# Patient Record
Sex: Female | Born: 1960 | Race: White | Hispanic: No | Marital: Married | Smoking: Never smoker
Health system: Southern US, Community
[De-identification: ages and names within clinical notes are randomized; demographics above are authoritative.]

## PROBLEM LIST (undated history)

## (undated) ENCOUNTER — Emergency Department (HOSPITAL_COMMUNITY): Admission: EM | Payer: Medicare Other

## (undated) DIAGNOSIS — I639 Cerebral infarction, unspecified: Secondary | ICD-10-CM

## (undated) DIAGNOSIS — Z992 Dependence on renal dialysis: Secondary | ICD-10-CM

## (undated) DIAGNOSIS — N19 Unspecified kidney failure: Secondary | ICD-10-CM

## (undated) DIAGNOSIS — F0390 Unspecified dementia without behavioral disturbance: Secondary | ICD-10-CM

## (undated) DIAGNOSIS — R21 Rash and other nonspecific skin eruption: Secondary | ICD-10-CM

## (undated) DIAGNOSIS — M549 Dorsalgia, unspecified: Secondary | ICD-10-CM

## (undated) DIAGNOSIS — Z5189 Encounter for other specified aftercare: Secondary | ICD-10-CM

## (undated) DIAGNOSIS — G8929 Other chronic pain: Secondary | ICD-10-CM

## (undated) DIAGNOSIS — I10 Essential (primary) hypertension: Secondary | ICD-10-CM

## (undated) DIAGNOSIS — D649 Anemia, unspecified: Secondary | ICD-10-CM

## (undated) DIAGNOSIS — B999 Unspecified infectious disease: Secondary | ICD-10-CM

## (undated) DIAGNOSIS — I739 Peripheral vascular disease, unspecified: Secondary | ICD-10-CM

## (undated) DIAGNOSIS — I469 Cardiac arrest, cause unspecified: Secondary | ICD-10-CM

## (undated) DIAGNOSIS — IMO0001 Reserved for inherently not codable concepts without codable children: Secondary | ICD-10-CM

## (undated) HISTORY — DX: Essential (primary) hypertension: I10

## (undated) HISTORY — PX: CARPAL TUNNEL RELEASE: SHX101

## (undated) HISTORY — DX: Anemia, unspecified: D64.9

## (undated) HISTORY — PX: HAND AMPUTATION: SUR26

---

## 2002-04-25 HISTORY — PX: BELOW KNEE LEG AMPUTATION: SUR23

## 2002-07-17 ENCOUNTER — Inpatient Hospital Stay (HOSPITAL_COMMUNITY): Admission: EM | Admit: 2002-07-17 | Discharge: 2002-07-23 | Payer: Self-pay | Admitting: Emergency Medicine

## 2002-07-17 ENCOUNTER — Encounter: Payer: Self-pay | Admitting: Family Medicine

## 2002-07-17 ENCOUNTER — Encounter: Payer: Self-pay | Admitting: Emergency Medicine

## 2002-07-18 ENCOUNTER — Encounter: Payer: Self-pay | Admitting: Family Medicine

## 2002-07-19 ENCOUNTER — Encounter (INDEPENDENT_AMBULATORY_CARE_PROVIDER_SITE_OTHER): Payer: Self-pay | Admitting: Specialist

## 2002-07-26 ENCOUNTER — Encounter: Admission: RE | Admit: 2002-07-26 | Discharge: 2002-07-26 | Payer: Self-pay | Admitting: Sports Medicine

## 2002-11-11 ENCOUNTER — Encounter: Admission: RE | Admit: 2002-11-11 | Discharge: 2002-12-11 | Payer: Self-pay | Admitting: Orthopedic Surgery

## 2003-07-04 ENCOUNTER — Inpatient Hospital Stay (HOSPITAL_COMMUNITY): Admission: AD | Admit: 2003-07-04 | Discharge: 2003-07-09 | Payer: Self-pay | Admitting: Orthopedic Surgery

## 2003-07-06 ENCOUNTER — Encounter (INDEPENDENT_AMBULATORY_CARE_PROVIDER_SITE_OTHER): Payer: Self-pay | Admitting: *Deleted

## 2004-12-07 ENCOUNTER — Ambulatory Visit: Payer: Self-pay | Admitting: General Surgery

## 2004-12-07 ENCOUNTER — Inpatient Hospital Stay: Payer: Self-pay | Admitting: General Surgery

## 2004-12-21 ENCOUNTER — Ambulatory Visit: Payer: Self-pay | Admitting: General Surgery

## 2005-04-28 ENCOUNTER — Inpatient Hospital Stay (HOSPITAL_COMMUNITY): Admission: EM | Admit: 2005-04-28 | Discharge: 2005-05-09 | Payer: Self-pay | Admitting: Emergency Medicine

## 2005-04-28 ENCOUNTER — Encounter: Admission: RE | Admit: 2005-04-28 | Discharge: 2005-04-28 | Payer: Self-pay | Admitting: Family Medicine

## 2005-04-28 ENCOUNTER — Ambulatory Visit: Payer: Self-pay | Admitting: Infectious Diseases

## 2005-04-28 ENCOUNTER — Encounter (INDEPENDENT_AMBULATORY_CARE_PROVIDER_SITE_OTHER): Payer: Self-pay | Admitting: *Deleted

## 2005-05-11 ENCOUNTER — Encounter (HOSPITAL_COMMUNITY): Admission: RE | Admit: 2005-05-11 | Discharge: 2005-08-09 | Payer: Self-pay | Admitting: Orthopaedic Surgery

## 2005-11-04 ENCOUNTER — Encounter: Admission: RE | Admit: 2005-11-04 | Discharge: 2005-11-04 | Payer: Self-pay | Admitting: Orthopaedic Surgery

## 2006-06-29 ENCOUNTER — Emergency Department: Payer: Self-pay | Admitting: Emergency Medicine

## 2007-09-25 ENCOUNTER — Ambulatory Visit (HOSPITAL_BASED_OUTPATIENT_CLINIC_OR_DEPARTMENT_OTHER): Admission: RE | Admit: 2007-09-25 | Discharge: 2007-09-25 | Payer: Self-pay | Admitting: Orthopedic Surgery

## 2009-02-26 ENCOUNTER — Inpatient Hospital Stay (HOSPITAL_COMMUNITY): Admission: EM | Admit: 2009-02-26 | Discharge: 2009-03-04 | Payer: Self-pay | Admitting: Emergency Medicine

## 2009-02-27 ENCOUNTER — Encounter (INDEPENDENT_AMBULATORY_CARE_PROVIDER_SITE_OTHER): Payer: Self-pay | Admitting: Internal Medicine

## 2009-08-08 ENCOUNTER — Inpatient Hospital Stay: Payer: Self-pay | Admitting: Internal Medicine

## 2009-10-08 ENCOUNTER — Emergency Department (HOSPITAL_COMMUNITY): Admission: EM | Admit: 2009-10-08 | Discharge: 2009-10-08 | Payer: Self-pay | Admitting: Emergency Medicine

## 2009-12-23 ENCOUNTER — Ambulatory Visit: Payer: Self-pay | Admitting: Ophthalmology

## 2009-12-24 HISTORY — PX: CATARACT EXTRACTION W/ INTRAOCULAR LENS IMPLANT: SHX1309

## 2010-01-04 ENCOUNTER — Ambulatory Visit: Payer: Self-pay | Admitting: Ophthalmology

## 2010-07-11 LAB — URINALYSIS, ROUTINE W REFLEX MICROSCOPIC
Ketones, ur: NEGATIVE mg/dL
Specific Gravity, Urine: 1.027 (ref 1.005–1.030)
Urobilinogen, UA: 0.2 mg/dL (ref 0.0–1.0)
pH: 5.5 (ref 5.0–8.0)

## 2010-07-11 LAB — CBC
HCT: 33 % — ABNORMAL LOW (ref 36.0–46.0)
Hemoglobin: 11 g/dL — ABNORMAL LOW (ref 12.0–15.0)
MCHC: 33.4 g/dL (ref 30.0–36.0)
MCV: 83.4 fL (ref 78.0–100.0)
Platelets: 285 K/uL (ref 150–400)
RBC: 3.96 MIL/uL (ref 3.87–5.11)
RDW: 16.6 % — ABNORMAL HIGH (ref 11.5–15.5)
WBC: 7 K/uL (ref 4.0–10.5)

## 2010-07-11 LAB — COMPREHENSIVE METABOLIC PANEL WITH GFR
ALT: 9 U/L (ref 0–35)
AST: 13 U/L (ref 0–37)
Albumin: 2 g/dL — ABNORMAL LOW (ref 3.5–5.2)
Alkaline Phosphatase: 236 U/L — ABNORMAL HIGH (ref 39–117)
BUN: 39 mg/dL — ABNORMAL HIGH (ref 6–23)
CO2: 19 meq/L (ref 19–32)
Calcium: 9 mg/dL (ref 8.4–10.5)
Chloride: 103 meq/L (ref 96–112)
Creatinine, Ser: 2.52 mg/dL — ABNORMAL HIGH (ref 0.4–1.2)
GFR calc non Af Amer: 20 mL/min — ABNORMAL LOW
Glucose, Bld: 183 mg/dL — ABNORMAL HIGH (ref 70–99)
Potassium: 4.2 meq/L (ref 3.5–5.1)
Sodium: 131 meq/L — ABNORMAL LOW (ref 135–145)
Total Bilirubin: 0.6 mg/dL (ref 0.3–1.2)
Total Protein: 7.1 g/dL (ref 6.0–8.3)

## 2010-07-11 LAB — LIPASE, BLOOD: Lipase: 43 U/L (ref 11–59)

## 2010-07-11 LAB — DIFFERENTIAL
Eosinophils Absolute: 0.2 10*3/uL (ref 0.0–0.7)
Eosinophils Relative: 3 % (ref 0–5)
Lymphs Abs: 1.5 10*3/uL (ref 0.7–4.0)
Monocytes Relative: 19 % — ABNORMAL HIGH (ref 3–12)

## 2010-07-11 LAB — URINE MICROSCOPIC-ADD ON

## 2010-07-28 ENCOUNTER — Inpatient Hospital Stay: Payer: Self-pay | Admitting: Internal Medicine

## 2010-07-28 LAB — BASIC METABOLIC PANEL
BUN: 28 mg/dL — ABNORMAL HIGH (ref 6–23)
CO2: 20 mEq/L (ref 19–32)
Calcium: 8 mg/dL — ABNORMAL LOW (ref 8.4–10.5)
Calcium: 8.2 mg/dL — ABNORMAL LOW (ref 8.4–10.5)
Calcium: 8.6 mg/dL (ref 8.4–10.5)
Chloride: 107 mEq/L (ref 96–112)
Chloride: 108 mEq/L (ref 96–112)
Creatinine, Ser: 1.71 mg/dL — ABNORMAL HIGH (ref 0.4–1.2)
Creatinine, Ser: 1.76 mg/dL — ABNORMAL HIGH (ref 0.4–1.2)
Creatinine, Ser: 1.85 mg/dL — ABNORMAL HIGH (ref 0.4–1.2)
Creatinine, Ser: 2.68 mg/dL — ABNORMAL HIGH (ref 0.4–1.2)
GFR calc Af Amer: 23 mL/min — ABNORMAL LOW (ref >60)
GFR calc Af Amer: 36 mL/min — ABNORMAL LOW (ref >60)
GFR calc non Af Amer: 29 mL/min — ABNORMAL LOW (ref >60)
Glucose, Bld: 102 mg/dL — ABNORMAL HIGH (ref 70–99)
Glucose, Bld: 106 mg/dL — ABNORMAL HIGH (ref 70–99)
Glucose, Bld: 200 mg/dL — ABNORMAL HIGH (ref 70–99)
Potassium: 5.1 mEq/L (ref 3.5–5.1)
Potassium: 5.2 mEq/L — ABNORMAL HIGH (ref 3.5–5.1)
Sodium: 130 mEq/L — ABNORMAL LOW (ref 135–145)
Sodium: 138 mEq/L (ref 135–145)

## 2010-07-28 LAB — CREATININE, URINE, 24 HOUR
Collection Interval-UCRE24: 24 hours
Creatinine, 24H Ur: 1388 mg/d (ref 700–1800)
Creatinine, Urine: 77.1 mg/dL
Urine Total Volume-UCRE24: 1800 mL

## 2010-07-28 LAB — GLUCOSE, CAPILLARY
Glucose-Capillary: 102 mg/dL — ABNORMAL HIGH (ref 70–99)
Glucose-Capillary: 108 mg/dL — ABNORMAL HIGH (ref 70–99)
Glucose-Capillary: 116 mg/dL — ABNORMAL HIGH (ref 70–99)
Glucose-Capillary: 146 mg/dL — ABNORMAL HIGH (ref 70–99)
Glucose-Capillary: 148 mg/dL — ABNORMAL HIGH (ref 70–99)
Glucose-Capillary: 148 mg/dL — ABNORMAL HIGH (ref 70–99)
Glucose-Capillary: 153 mg/dL — ABNORMAL HIGH (ref 70–99)
Glucose-Capillary: 156 mg/dL — ABNORMAL HIGH (ref 70–99)
Glucose-Capillary: 156 mg/dL — ABNORMAL HIGH (ref 70–99)
Glucose-Capillary: 159 mg/dL — ABNORMAL HIGH (ref 70–99)
Glucose-Capillary: 161 mg/dL — ABNORMAL HIGH (ref 70–99)
Glucose-Capillary: 167 mg/dL — ABNORMAL HIGH (ref 70–99)
Glucose-Capillary: 189 mg/dL — ABNORMAL HIGH (ref 70–99)
Glucose-Capillary: 233 mg/dL — ABNORMAL HIGH (ref 70–99)
Glucose-Capillary: 86 mg/dL (ref 70–99)

## 2010-07-28 LAB — ANTI-NEUTROPHIL ANTIBODY

## 2010-07-28 LAB — RENAL FUNCTION PANEL
Albumin: 1.8 g/dL — ABNORMAL LOW (ref 3.5–5.2)
GFR calc Af Amer: 26 mL/min — ABNORMAL LOW (ref >60)
GFR calc non Af Amer: 22 mL/min — ABNORMAL LOW (ref >60)
Glucose, Bld: 102 mg/dL — ABNORMAL HIGH (ref 70–99)
Phosphorus: 4.5 mg/dL (ref 2.3–4.6)
Potassium: 4.8 mEq/L (ref 3.5–5.1)
Sodium: 137 mEq/L (ref 135–145)

## 2010-07-28 LAB — CBC
HCT: 27.4 % — ABNORMAL LOW (ref 36.0–46.0)
HCT: 29.2 % — ABNORMAL LOW (ref 36.0–46.0)
Hemoglobin: 10.1 g/dL — ABNORMAL LOW (ref 12.0–15.0)
Hemoglobin: 10.2 g/dL — ABNORMAL LOW (ref 12.0–15.0)
Hemoglobin: 9.5 g/dL — ABNORMAL LOW (ref 12.0–15.0)
MCHC: 34.6 g/dL (ref 30.0–36.0)
MCHC: 34.7 g/dL (ref 30.0–36.0)
MCHC: 34.8 g/dL (ref 30.0–36.0)
MCV: 87.7 fL (ref 78.0–100.0)
MCV: 87.8 fL (ref 78.0–100.0)
Platelets: 289 10*3/uL (ref 150–400)
Platelets: 312 10*3/uL (ref 150–400)
RBC: 3.12 MIL/uL — ABNORMAL LOW (ref 3.87–5.11)
RBC: 3.3 MIL/uL — ABNORMAL LOW (ref 3.87–5.11)
RBC: 3.94 MIL/uL (ref 3.87–5.11)
RDW: 12.6 % (ref 11.5–15.5)
RDW: 12.8 % (ref 11.5–15.5)
RDW: 12.8 % (ref 11.5–15.5)
WBC: 12.3 10*3/uL — ABNORMAL HIGH (ref 4.0–10.5)

## 2010-07-28 LAB — PTH, INTACT AND CALCIUM: Calcium, Total (PTH): 8 mg/dL — ABNORMAL LOW (ref 8.4–10.5)

## 2010-07-28 LAB — PROTEIN ELECTROPH W RFLX QUANT IMMUNOGLOBULINS
Alpha-2-Globulin: 22.1 % — ABNORMAL HIGH (ref 7.1–11.8)
Beta 2: 9.4 % — ABNORMAL HIGH (ref 3.2–6.5)
Beta Globulin: 5.8 % (ref 4.7–7.2)
Gamma Globulin: 19.4 % — ABNORMAL HIGH (ref 11.1–18.8)
M-Spike, %: NOT DETECTED g/dL
Total Protein ELP: 6.2 g/dL (ref 6.0–8.3)

## 2010-07-28 LAB — COMPREHENSIVE METABOLIC PANEL
Alkaline Phosphatase: 85 U/L (ref 39–117)
BUN: 30 mg/dL — ABNORMAL HIGH (ref 6–23)
BUN: 53 mg/dL — ABNORMAL HIGH (ref 6–23)
CO2: 22 mEq/L (ref 19–32)
CO2: 24 mEq/L (ref 19–32)
Calcium: 8.1 mg/dL — ABNORMAL LOW (ref 8.4–10.5)
Chloride: 102 mEq/L (ref 96–112)
GFR calc Af Amer: 27 mL/min — ABNORMAL LOW (ref >60)
GFR calc non Af Amer: 16 mL/min — ABNORMAL LOW (ref >60)
Glucose, Bld: 195 mg/dL — ABNORMAL HIGH (ref 70–99)
Potassium: 4.3 mEq/L (ref 3.5–5.1)
Sodium: 134 mEq/L — ABNORMAL LOW (ref 135–145)
Total Bilirubin: 0.7 mg/dL (ref 0.3–1.2)
Total Protein: 6.2 g/dL (ref 6.0–8.3)

## 2010-07-28 LAB — DIFFERENTIAL
Basophils Relative: 0 % (ref 0–1)
Eosinophils Absolute: 0.1 10*3/uL (ref 0.0–0.7)
Eosinophils Relative: 1 % (ref 0–5)
Lymphs Abs: 1.9 10*3/uL (ref 0.7–4.0)
Monocytes Absolute: 0.9 10*3/uL (ref 0.1–1.0)
Monocytes Relative: 7 % (ref 3–12)
Neutro Abs: 9.3 10*3/uL — ABNORMAL HIGH (ref 1.7–7.7)

## 2010-07-28 LAB — IRON AND TIBC
Iron: 28 ug/dL — ABNORMAL LOW (ref 42–135)
UIBC: 122 ug/dL

## 2010-07-28 LAB — MAGNESIUM: Magnesium: 2.3 mg/dL (ref 1.5–2.5)

## 2010-07-28 LAB — IGG, IGA, IGM
IgA: 455 mg/dL — ABNORMAL HIGH (ref 68–378)
IgG (Immunoglobin G), Serum: 1320 mg/dL (ref 694–1618)
IgM, Serum: 104 mg/dL (ref 60–263)

## 2010-07-28 LAB — NA AND K (SODIUM & POTASSIUM), 24 H UR
Potassium Urine: 23 mEq/L
Potassium, 24H Ur: 41 mEq/d (ref 25–125)
Sodium, Ur: 80 mEq/L

## 2010-07-28 LAB — UIFE/LIGHT CHAINS/TP QN, 24-HR UR
Albumin, U: DETECTED
Alpha 1, Urine: DETECTED — AB
Alpha 2, Urine: DETECTED — AB
Beta, Urine: DETECTED — AB
Free Kappa/Lambda Ratio: 2.98 ratio (ref 0.46–4.00)
Free Lt Chn Excr Rate: 417.6 mg/d
Total Protein, Urine: 173.1 mg/dL
Volume, Urine: 1800 mL

## 2010-07-28 LAB — SODIUM, URINE, TIMED: Sodium, Ur: 76 mEq/L

## 2010-07-28 LAB — CULTURE, ROUTINE-ABSCESS

## 2010-07-28 LAB — ANAEROBIC CULTURE

## 2010-07-28 LAB — ANA: Anti Nuclear Antibody(ANA): NEGATIVE

## 2010-07-28 LAB — VITAMIN D 25 HYDROXY (VIT D DEFICIENCY, FRACTURES): Vit D, 25-Hydroxy: 24 ng/mL — ABNORMAL LOW (ref 30–89)

## 2010-07-28 LAB — C4 COMPLEMENT: Complement C4, Body Fluid: 40 mg/dL (ref 16–47)

## 2010-07-28 LAB — SEDIMENTATION RATE: Sed Rate: 58 mm/hr — ABNORMAL HIGH (ref 0–22)

## 2010-07-28 LAB — PHOSPHORUS: Phosphorus: 3.3 mg/dL (ref 2.3–4.6)

## 2010-07-28 LAB — TRANSFERRIN: Transferrin: 110 mg/dL — ABNORMAL LOW (ref 212–360)

## 2010-07-29 HISTORY — PX: INSERTION OF DIALYSIS CATHETER: SHX1324

## 2010-09-03 ENCOUNTER — Ambulatory Visit: Payer: Self-pay | Admitting: Vascular Surgery

## 2010-09-07 NOTE — Op Note (Signed)
[name], [name]              ACCOUNT NO.:  [id number]   MEDICAL RECORD NO.:  [id number]          PATIENT TYPE:  AMB   LOCATION:  DSC                          FACILITY:  [facility]   PHYSICIAN:  Cindee Salt, M.D.       DATE OF BIRTH:  [birth year]   DATE OF PROCEDURE:  09/25/2007  DATE OF DISCHARGE:                               OPERATIVE REPORT   PREOPERATIVE DIAGNOSES:  1. Carpal tunnel syndrome, left hand.  2. Ulnar neuropathy, left elbow.   POSTOPERATIVE DIAGNOSIS:  1. Carpal tunnel syndrome, left hand.  2. Ulnar neuropathy, left elbow.   OPERATION:  Left carpal tunnel release with decompression ulnar nerve,  left elbow.   SURGEON:  Cindee Salt, MD   ASSISTANT:  Carolyne Fiscal, RN   ANESTHESIA:  General.   ANESTHESIOLOGIST:  Quita Skye. Krista Blue, MD.   HISTORY:  The patient is a 50 year old female with a history of  numbness, tingling, and pain in multiple fingers.  EMG nerve conductions  reveal ulnar neuropathy at her elbow, carpal tunnel.  This has not  responded to conservative treatment.  She is admitted now for carpal  tunnel release on her left hand with decompression, possible  transposition to ulnar nerve, left elbow.  She is aware of risks and  complications including; infection, recurrence injury to arteries,  nerves, tendons; incomplete relief of symptoms, and dystrophy.  In the  preoperative area, the patient is seen.  The extremity was marked, by  both the patient and surgeon.  Questions again encouraged and answered.  Antibiotic given.   PROCEDURE:  The patient was brought to the operating room where a  general anesthetic was carried out without difficulty.  She was prepped  using DuraPrep in supine position with left arm free.  A time-out was  then performed.  A tourniquet was used to exsanguinate the limb.  An  Esmarch bandage was used to exsanguinate the limb.  A tourniquet placed  high and the arm was inflated to 250 mmHg.  A longitudinal incision was  made in the  palm, carried down through the subcutaneous tissue.  Bleeders were electrocauterized.  The palmar fascia was split,  superficial palmar arch identified, the flexor tendon to the ring little  finger identified to the ulnar side of the median nerve, and carpal  retinaculum was incised with sharp dissection.  A right-angle and Sewall  retractor were placed between the skin and forearm fascia.  The fascia  was released for approximately 1.5 cm proximal to the wrist crease under  direct vision.  The canal was explored.  No further lesions were  identified.  Area compression to the nerve was apparent at the distal  margin of the hamate hook.  No further lesions were identified.  The  wound was irrigated.  Skin closed with interrupted 5-0 Vicryl Rapide  sutures.  A separate incision was then made over the medial side of the  elbow, carried down through the subcutaneous tissue.  Bleeders again  were electrocauterized.  The dissection was carried down to the medial  epicondyle, taking care to protect the posterior branches of  the medial  antebrachial cutaneous nerve of the forearm.  The Osborne's fascia was  then opened, the ulnar nerve was identified.  A fasciotomy was performed  to flexor carpi ulnaris.  A release performed up to the level of the  arcade of Struthers.  All fascia was removed from the underlying ulnar  nerve.  The arm placed through full flexion.  No subluxation was noted.  The wound was irrigated.  The subcutaneous tissue was then closed with  interrupted 4-0 Vicryl sutures and the skin with interrupted 5-0 Vicryl  Rapide sutures.  A sterile compressive dressing long-arm splint was  applied in approximately 30 degrees of flexion.  This included the wrist  and left the fingers free.  The patient tolerated the procedure well.  She was taken to the recovery room for observation in satisfactory  condition.   DISCHARGE SUMMARY:  She will be discharged home to return to [facility] in 1 week on [place] [facility].            ______________________________  Cindee Salt, M.D.     GK/MEDQ  D:  09/25/2007  T:  09/26/2007  Job:  [id number]   cc:   Aida Puffer  Dr. Vear Clock

## 2010-09-09 ENCOUNTER — Ambulatory Visit: Payer: Self-pay | Admitting: Vascular Surgery

## 2010-09-10 NOTE — Discharge Summary (Signed)
NAME:  [name], [name]              ACCOUNT NO.:  [id number]   MEDICAL RECORD NO.:  [id number]          PATIENT TYPE:  INP   LOCATION:  5040                         FACILITY:  [facility]   PHYSICIAN:  Mark C. Ophelia Charter, M.D.    DATE OF BIRTH:  [birth year]   DATE OF ADMISSION:  04/28/2005  DATE OF DISCHARGE:  05/09/2005                                 DISCHARGE SUMMARY   FINAL DIAGNOSES:  1.  Diabetes with vulva abscess extending to right posterior thigh      compartment.  2.  Escherichia coli urinary tract infection.  3.  Morbid obesity.  4.  Type 2 diabetes.  5.  Hypertension.   This 50 year old female had been treated in [place].  Her husband had  been doing packing changes to the vulva for several weeks with no  antibiotics, and she presented with purulent drainage and high fever and an  elevated white count, thigh swelling and inability to ambulate.  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  Workup included CT scan  which showed the large abscess in the posterior compartment.  She was taken  to the operating room initially by Dr. Ophelia Charter on April 28, 2005, with  extensive debridement and drainage of a large posterior abscess with  posterior incision.  She had one procedure done by Dr. Lajoyce Corners on April 30, 2005, and then repeat debridement by Dr. Ophelia Charter on May 02, 2005, and  May 04, 2005.  Antibiotic beads were placed at some of the procedures.  Albumin was 1.4.  A nutritional consult was obtained.  She also had a sodium  of 126, which was treated with fluid restriction.  She was placed on  vancomycin and Primaxin and also Clindamycin.  Cultures, initially Gram  stain showed gram-positive cocci.  Creatinine was elevated at 2.0.  Infectious disease consultation saw the patient and managed her antibiotics.  With serial wash-outs she had gradual improvement in the wound status with  antibiotic bead placement and removals.  Final beads were removed at the  bedside.   Glucose gradually came under control with sliding scales from the  200s down to the 67-125 range.  Hemoglobin A1c was 12.5.  Prealbumin was 3.5  and C-reactive protein was 3.  Abscess from her thigh showed moderate beta  strep, Streptococcus agalactiae, moderate E. coli, moderate group B strep.  She was finally on Augmentin 875 mg p.o. b.i.d. by the time she was  discharged, outpatient dressing changes with outpatient whirlpool, pulse  lavage ranged at discharge time.  PICC line was removed and temperature was  down.   CONDITION ON DISCHARGE:  Improved.      Mark C. Ophelia Charter, M.D.  Electronically Signed     MCY/MEDQ  D:  07/13/2005  T:  07/14/2005  Job:  [id number]

## 2010-09-10 NOTE — Op Note (Signed)
NAME:  [name], [name]              ACCOUNT NO.:  [id number]   MEDICAL RECORD NO.:  [id number]          PATIENT TYPE:  INP   LOCATION:  5040                         FACILITY:  [facility]   PHYSICIAN:  Mark C. Ophelia Charter, M.D.    DATE OF BIRTH:  03-24-1961   DATE OF PROCEDURE:  05/04/2005  DATE OF DISCHARGE:  05/09/2005                                 OPERATIVE REPORT   PREOPERATIVE DIAGNOSIS:  Right diabetic abscess involving the right labia,  pubic bone, and right hamstring compartment.   POSTOPERATIVE DIAGNOSIS:  Right diabetic abscess involving the right labia,  pubic bone, and right hamstring compartment.   PROCEDURE:  Repeat irrigation, excisional debridement, irrigation of right  thigh, right labia, right hamstring wound, and placement of polymethyl  methacrylate antibiotic beads.   ANESTHESIA:  GOT.   BRIEF HISTORY:  This is one of several procedures for a 50 year old female  who presented with an abscess starting from her labia which had been  initially treated in [place] and had some packing.  She had been off  antibiotics and the abscess had tracked distally and then posteriorly into  the posterior thigh with a massive collection of purulent material in the  hamstring compartment.  This was dictation is being redictated a month  later.   DESCRIPTION OF PROCEDURE:  After induction of anesthesia and careful  positioning, packing was removed and standard DuraPrep was used, split  sheets were used with impervious  stockinette and Coban.  The packing was  removed from both hamstring compartment and pubic region.  Excisional  debridement of necrotic tissue was performed.  Pulsatile lavage was used.  As before, the two wounds communicated.  After pulsatile lavage and removal  of remaining material, at the start of the procedure, the patient had  antibiotic beads removed and new antibiotic beads were mixed and then  replaced exiting posteriorly but beads were placed anteriorly, as  well, so  they could all be pulled out from a posterior approach.  The tissue, at this  time, was improved and she should be able to continue with outpatient lavage  once she is discharged and can be seen by physical therapy for hydrotherapy  now, since her tissues are improving and no large amounts of further  necrotic tissue are present.  Cultures have been growing Prevotella bivia  and also group B Strep and also E. coli.  After a dressing was applied, the  patient was placed back in the supine position, extubated, and transferred  to the recovery room in stable condition.      Mark C. Ophelia Charter, M.D.  Electronically Signed     MCY/MEDQ  D:  06/17/2005  T:  06/17/2005  Job:  [id number]

## 2010-09-10 NOTE — Op Note (Signed)
NAME:  [name], [name]                        ACCOUNT NO.:  [id number]   MEDICAL RECORD NO.:  [id number]                   PATIENT TYPE:  INP   LOCATION:  5014                                 FACILITY:  [facility]   PHYSICIAN:  [name], M.D.                DATE OF BIRTH:  Jun 07, 1960   DATE OF PROCEDURE:  07/07/2003  DATE OF DISCHARGE:                                 OPERATIVE REPORT   PREOPERATIVE DIAGNOSIS:  Status post irrigation and debridement and [name] right  third ray amputation on March 13.   POSTOPERATIVE DIAGNOSIS:  Status post irrigation and debridement and [name] right  third ray amputation on March 13.   PROCEDURE:  Irrigation and debridement of skin, soft tissue, and bone,  including pulse lavage with normal saline.   SURGEON:  [name], M.D.   ANESTHESIA:  LMA plus ankle block.   ESTIMATED BLOOD LOSS:  Minimal.   ANTIBIOTICS:  Zosyn preoperatively.   TOURNIQUET TIME:  None.   DISPOSITION:  To the PACU in stable condition.   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  The patient had the wound loosely closed and presents at  this time for repeat irrigation, debridement, and further debridement of  skin, soft tissue, and bone.  The risks and benefits were discussed with the  patient including persistent infection, nonhealing of the wound, need for  below the knee amputation.  The patient states she understands and wishes to  proceed at this time.   DESCRIPTION OF PROCEDURE:  The patient was brought to OR room 5 after  undergoing an ankle block.  She then underwent general anesthesia with LMA.  After an adequate level of anesthesia was obtained, the patient's right  lower extremity was prepped using Betadine paint and draped into [name] sterile  field.  The V incision from the previous third ray amputation was further  ellipsed out back to bleeding,  viable tissue.  There was good petechial  bleeding along the wound edges.  There was no abscess or purulence.  After  debridement of skin, soft tissue, and bone, the wound was irrigated with  pulse lavage.  The wound had good petechial bleeding.  The wound was then  closed using 2-0 nylon with vertical mattress, horizontal mattress, and [name]  far-near, near-far stitch.  The wound edges closed easily without tension on  the skin.  The wounds were covered with Adaptic, orthopedic sponges, sterile  Kerlix, and [name] Coban dressing.  The patient was extubated and taken to the  PACU in stable condition.  The plan is to continue IV antibiotics, continue  with glucose control, physical therapy non-weight bearing on the right.  [name], M.D.   MVD/MEDQ  D:  07/07/2003  T:  07/08/2003  Job:  [id number]

## 2010-09-10 NOTE — Op Note (Signed)
NAMEATHEENA, [name]              ACCOUNT NO.:  [id number]   MEDICAL RECORD NO.:  [id number]          PATIENT TYPE:  INP   LOCATION:  5040                         FACILITY:  [facility]   PHYSICIAN:  [name], MD     DATE OF BIRTH:  01-16-1961   DATE OF PROCEDURE:  04/30/2005  DATE OF DISCHARGE:                                 OPERATIVE REPORT   PREOPERATIVE DIAGNOSIS:  Abscess right thigh and right labia.   POSTOPERATIVE DIAGNOSIS:  Abscess right thigh and right labia.   PROCEDURE:  Irrigation debridement right thigh abscess and right labial  abscess.   SURGEON:  [name], M.D.   ANESTHESIA:  General.   ESTIMATED BLOOD LOSS:  Minimal.   ANTIBIOTICS:  Primaxin and vancomycin preoperatively.   DRAINS:  One Penrose drain.   DISPOSITION:  To PACU in stable condition.   INDICATIONS FOR PROCEDURE:  The patient is a 50 year old woman with a labial  and hamstring abscess.  The patient initially underwent irrigation and  debridement by Dr. Ophelia Charter.  She presents at this time for repeat irrigation  and debridement and will anticipate a followup irrigation and debridement  on Monday.  Risks and benefits were discussed including persistent  infection, need for additional surgery.  The patient states she understands  and wishes proceed at this time.   DESCRIPTION OF PROCEDURE:  The patient was brought to OR room #5 and  underwent a general anesthetic.  After adequate level of anesthesia  obtained, the patient was placed in the left lateral decubitus position with  the right side up and the right lower extremity was prepped using Betadine  paint and draped into a sterile field.  The staples were removed from the  posterior incision, and the packing was removed from the anterior labral  incision.  The antibiotic beads were removed. There were no loose beats  within the leg.  The wounds were irrigated with pulse lavage. There was  still persistent purulence prior to irrigation  debridement.  There was good  petechial bleeding along the muscle and wound edges. There was no purulence  after debridement.  A Penrose drain was placed deep in the wound  posteriorly, and the wound was closed loosely with 2-0 nylon.  Anteriorly  after irrigation debridement with pulse lavage, the wound was wicked open  with saline-soaked Kerlix.  This was also covered with 4x4s  Both  wounds were covered with 4x4s, ABDs and Hypafix tape.  The patient was  extubated, taken to PACU in stable condition.   PLAN:  Plan for repeat irrigation debridement on Monday.      [name], MD  Electronically Signed     MVD/MEDQ  D:  04/30/2005  T:  04/30/2005  Job:  [id number]

## 2010-09-10 NOTE — Op Note (Signed)
NAME:  [name], [name]              ACCOUNT NO.:  [id number]   MEDICAL RECORD NO.:  [id number]          PATIENT TYPE:  INP   LOCATION:  1825                         FACILITY:  [facility]   PHYSICIAN:  Mark C. Ophelia Charter, M.D.    DATE OF BIRTH:  1960/09/02   DATE OF PROCEDURE:  04/28/2005  DATE OF DISCHARGE:                                 OPERATIVE REPORT   PREOPERATIVE DIAGNOSIS:  Diabetic with buttocks, thigh and labial abscess,  question Fournier's gangrene.   POSTOPERATIVE DIAGNOSIS:  Right labial abscess with large thigh posterior  compartment and buttocks abscess with osteomyelitis of ischium and pubic  bone, gram positive cocci.   OPERATION PERFORMED:  Excision and debridement ischium, pubic bone, right  hamstrings, buttocks.  Debridement of skin and subcutaneous tissue, muscle  and bone.  Placement of polymethyl methacrylate Vancomycin beads.   SURGEON:  Mark C. Ophelia Charter, M.D.   ESTIMATED BLOOD LOSS:  300 mL.   Anterior wound packed.   CULTURES:  Aerobic and anaerobic cultures in the holding area prior to  antibiotics.  Intraoperative aerobic and anaerobic cultures.  Tissue for  fungus and pathologic exam.   ANESTHESIA:   INDICATIONS FOR PROCEDURE:  This 50 year old insulin dependent diabetic was  seen by me for the first time in the emergency room after I was called by  the ER staff stating that the patient had had an MRI today ordered by  Lillia Carmel, M.D. that revealed changes consistent with Fournier's  gangrene with posterior compartment abscess, buttock abscess, medial  compartment of the thigh abscess with changes up into the distal aspect of  the rectus muscle. She had had a boil treated by Dr. Scotty Court in [place],  who treated her short term with some antibiotics and then her husband has  been packing this labial area on the right for well over a month.  She  states she has been controlling the pain with Percocet.  She had some  fevers, was unable to stand, has  had a previous left below-knee amputation  done by [name], M.D. in 2004.  Thigh was tense, extremely painful  and plain x-ray demonstrated osteomyelitis type changes in the ischium.  MRI  report was consistent with Fournier's gangrene.   DESCRIPTION OF PROCEDURE:  The patient was placed in lateral position with  the left side down, right side up.  Axillary roll.  Drapes were used.  Split  sheets with impervious stockinette, Coban for access to both the labial  region, also posterior aspect of the thigh.  An extremely hard indurated  area, a 10 inch incision was made.  As soon as the subcutaneous tissue was  entered, there was immediate purulent material.  Cultures were obtained.  There was fat necrosis, fascia necrosis. This was excised.  Finger was used  for probing, extended down into the hamstrings.  The sciatic nerve was  identified by palpation in its normal position and there was some  involvement of semitendinosus, semimembranosus with purulent drainage and  some muscle necrosis.  Necrotic muscle was excised, some of the necrotic  tissue was sent for  pathologic exam. There was venous thrombosis in  subcutaneous tissue, also the muscle.  Finger dissection of the posterior  aspect of the trochanter was palpated as well as the ischial tuberosity.  In  the subcutaneous tissue it was tracked and some little pockets were all  irrigated out, used sharp curetting, pulsatile lavage and debridement with  rongeur and curette as needed.  Repeat pulsatile lavage was used.  Hip was  then externally rotated and the area was enlarged where she had a 3 mm  region that with palpation over the pubic bone would drain purulent  material. This was an area that had been cultured and this area was enlarged  using straight scissors, cutting the skin and then with finger palpation,  pulsatile lavage was used for irrigation.  The ischium was curetted.  Repeat  pulsatile lavage with another liter and  then finger dissection communicating  the posterior and medial anterior wounds and a __________ sponge was passed  through to make sure there was plenty of room for communication.  Repeat  pulsatile lavage anteriorly and then antibiotic beads were mixed.  A total  of I believe 34 beads were made by the scrub nurse.  The knots were placed  on the end of a #2 Ethibond and it was packed in the posterior with some  placed medial but not so many that would not be able to be removed.  Packing  was placed anteriorly with Betadine soaked Kerlix.  Posterior incision was  closed leaving two 1 inch gaps for drainage and multiple 4 x 4s, ABD's were  applied.  The patient was then transferred to the recovery room and will  have repeat debridement in 48 hours with high dose antibiotics.  Instrument  and needle counts were correct.      Mark C. Ophelia Charter, M.D.  Electronically Signed     MCY/MEDQ  D:  04/28/2005  T:  04/29/2005  Job:  [id number]

## 2010-09-10 NOTE — Discharge Summary (Signed)
NAME:  Bonnie [name], Bonnie [name]                        ACCOUNT NO.:  [id number]   MEDICAL RECORD NO.:  [id number]                   PATIENT TYPE:  INP   LOCATION:  5014                                 FACILITY:  [facility]   PHYSICIAN:  [name], M.D.                DATE OF BIRTH:  10/04/1960   DATE OF ADMISSION:  07/04/2003  DATE OF DISCHARGE:  07/09/2003                                 DISCHARGE SUMMARY   ADMISSION DIAGNOSIS:  Abscess right forefoot with gangrenous changes.   DISCHARGE DIAGNOSIS:  Abscess right forefoot with gangrenous changes.   PROCEDURE:  The patient underwent two surgical procedures for irrigation and  debridement and amputation of the right third ray.   CONDITION ON DISCHARGE:  Discharged to home in stable condition.   FOLLOW UP:  Plan to follow up in the office in two weeks.   HISTORY OF PRESENT ILLNESS:  The patient is [name] 50 year old woman with  insensate diabetic neuropathy who presented to the office on July 04, 2003  with cellulitis of the right forefoot with necrotic third toe.   HOSPITAL COURSE:  The patient was admitted for IV antibiotics and wound  care.  The patient's hospital course was essentially unremarkable.  She was  started on IV Zosyn for the infection cellulitis of the right forefoot.   On day one after antibiotics, her temperature was 100.9.  White blood cell  count was 17.1.  Albumin 2.0.  CBG of 338.  The forefoot still had  cellulitis.  The swelling had decreased.  The skin was now wrinkling at this  time.  She was continued on IV antibiotics and twice [name] day Dial soap soaks.  The patient continued with wound care and underwent irrigation, debridement  and right third ray amputation on July 06, 2003.  The cultures were  obtained x 2.  Ankle brachial indices showed adequate flow and she was  continued on her Zosyn.   Postoperative day one, the patient had progressive cellulitis and the  patient was scheduled for repeat irrigation and  debridement.  Medicine was  consulted for better glucose control.  The patient went back to surgery on  July 07, 2003 for repeat irrigation and debridement of skin, soft tissue,  and bone.  Status post irrigation, debridement, and [name] third ray amputation.   Postoperatively, the patient progressed well.  She was continued on the IV  Zosyn.  She was started on physical therapy with nonweightbearing on the  right.  She was kept on IV antibiotics for two days postoperatively and was  discharged to home in stable condition on July 09, 2003.  Antibiotics p.o.  after discharge with follow-up in the office in two weeks.  [name], M.D.    MVD/MEDQ  D:  07/31/2003  T:  07/31/2003  Job:  [id number]

## 2010-09-10 NOTE — Op Note (Signed)
NAME:  [name], HENNEN A                        ACCOUNT NO.:  [id number]   MEDICAL RECORD NO.:  [id number]                   PATIENT TYPE:  INP   LOCATION:  5014                                 FACILITY:  [facility]   PHYSICIAN:  [name], M.D.                DATE OF BIRTH:  1960/06/26   DATE OF PROCEDURE:  07/06/2003  DATE OF DISCHARGE:                                 OPERATIVE REPORT   PREOPERATIVE DIAGNOSIS:  Abscess and necrosis, right third ray, with midfoot  abscess.   PROCEDURE:  Right third ray amputation with irrigation and debridement of  skin, soft tissue, and bone.   SURGEON:  [name], M.D.   ANESTHESIA:  General.   ESTIMATED BLOOD LOSS:  Minimal.   ANTIBIOTICS:  Zosyn preoperatively.  Cultures obtained x2.   Ankle brachial indices preoperatively showed adequate circulation for  surgery.   DISPOSITION:  To PACU in stable condition.   INDICATION FOR PROCEDURE:  The patient is a 50 year old woman who presented  with an ischemic, necrotic right third toe.  The patient was admitted,  placed on IV antibiotics.  She had resolution of the cellulitis in her foot  but after antibiotics had a focal abscess identified, and the patient was  brought at this time for irrigation and debridement, decompression of the  abscess, and third ray amputation.  Risks and benefits were discussed,  including infection, neurovascular injury, nonhealing of the wound, need for  additional surgery, potential for below-knee amputation.  The patient states  she understands and wishes to proceed at this time.   DESCRIPTION OF PROCEDURE:  The patient was brought to OR room 5 and  underwent a general anesthetic.  After an adequate level of anesthesia  obtained, the patient's right lower extremity was prepped using Duraprep and  draped into a sterile field.  A V-type incision was made around the third  toe and third ray and the third ray was amputated.  There was a large deep  abscess, and  this was cultured x2.  The wound was irrigated with normal  saline.  There were no further areas of necrosis.  The wound was loosely  closed with a deep drain placed.  Anticipate returning to surgery for repeat  irrigation and debridement and partial further amputation.  After  debridement and closure, the wound was closed over a deep drain with Adaptic  orthopedic sponges, sterile Webril, and a Coban dressing.  The patient was  extubated, taken to the PACU in stable condition.                                               [name], M.D.    MVD/MEDQ  D:  07/06/2003  T:  07/07/2003  Job:  [id number]

## 2010-09-10 NOTE — Op Note (Signed)
NAME:  [name], [name]              ACCOUNT NO.:  [id number]   MEDICAL RECORD NO.:  [id number]          PATIENT TYPE:  INP   LOCATION:  5040                         FACILITY:  [facility]   PHYSICIAN:  Mark C. Ophelia Charter, M.D.    DATE OF BIRTH:  October 05, 1960   DATE OF PROCEDURE:  05/02/2005  DATE OF DISCHARGE:                                 OPERATIVE REPORT   PREOPERATIVE DIAGNOSIS:  Right posterior thigh hamstring abscess, labral  abscess and ischial and pubic osteomyelitis.   POSTOPERATIVE DIAGNOSIS:  Right posterior thigh hamstring abscess, labral  abscess and ischial and pubic osteomyelitis.   OPERATION PERFORMED:  Repeat irrigation and excisional debridement,  posterior hamstring irrigation, right labral and pubic bone wound and  antibiotic bead placement.   SURGEON:  Mark C. Ophelia Charter, M.D.   ANESTHESIA:  GOT.   ESTIMATED BLOOD LOSS:  100 to 200 mL.   DESCRIPTION OF PROCEDURE:  After induction of general anesthesia, the  patient was placed in the lateral position with the total hip arthroplasty  Mark VII frame holder.  She has had a left below-knee amputation in the  past.  The right hip was up.  Packing was removed from the anterior labral  incision and Penrose removed from the posterior incision. There was a large  amount of purulent drainage in the 4 x 4s.  After draping using total hip  drapes with impervious stockinet and Coban, sutures were removed and the  wound was opened.  There was necrotic fascia, still some purulent material  and finger dissection showed the purulent pocket dissected distally and  anteriorly along over the top of the __________.  This was irrigated and  sharply debrided using curettes.  The wound still extended from the  posterior wound to the anterior labral wound.  Pulsatile lavage was used in  the posterior wound.  In the anterior wound there was not any necrotic  tissue.  Packing had been removed.  Pulsatile lavage was used as well.  Scraping was  performed and careful palpation and probing to make sure there  were no pockets of purulent material that had not been decompressed.  After  repeat pulsatile lavage anteriorly and posteriorly, 20 antibiotic beads were  packed from posterior to anterior and then Kerlix was placed along side with  some Betadine in it.  Care was taken to make sure some of the beads went  distal and anterior in the area where the purulent necrotic tissue had been  found and it was able to be milked out.  After final debridement with a  second 3 L bag,  #1 or #2 nylon sutures were used simple interrupted leaving  the packing hanging out for drainage.  It is possible that we will repeat  debridement in 48 hours.  She may require VAC placement.  4 x 4s, ABD and  tape were applied.  The patient was then transferred to the recovery room in  stable condition.  Instrument and needle counts were correct.     Mark C. Ophelia Charter, M.D.  Electronically Signed    MCY/MEDQ  D:  05/02/2005  T:  05/03/2005  Job:  [id number]

## 2010-09-10 NOTE — Op Note (Signed)
NAMEMARELLY, [name]                          ACCOUNT NO.:  [id number]   MEDICAL RECORD NO.:  [id number]                   PATIENT TYPE:  INP   LOCATION:  5531                                 FACILITY:  [facility]   PHYSICIAN:  [name], M.D.                DATE OF BIRTH:  05-Jun-1960   DATE OF PROCEDURE:  07/18/2002  DATE OF DISCHARGE:                                 OPERATIVE REPORT   PREOPERATIVE DIAGNOSIS:  Osteomyelitis, left foot with a deep abscess.   POSTOPERATIVE DIAGNOSIS:  Osteomyelitis, left foot with a deep abscess.   PROCEDURE:  Left below-knee amputation.   SURGEON:  [name], M.D.   ANESTHESIA:  General LMA.   ESTIMATED BLOOD LOSS:  Minimal.   ANTIBIOTICS:  Zosyn preoperatively.   TOURNIQUET TIME:  11 minutes at 350 mmHg.   DISPOSITION:  To the PACU in condition.   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 The surgical options including foot salvage versus below-knee  amputation were discussed. The patient states she wished to proceed with a  below-knee amputation. The risks and benefits were discussed including  infection, neurovascular injury, nonhealing of the incision, need for a  higher level amputation. The patient states she understands and wishes to  proceed at this time.   DESCRIPTION OF PROCEDURE:  The patient was brought to the operating room 1  and underwent a general LMA anesthetic. After an adequate level of  anesthesia was obtained the patient's left lower extremity was prepped using  Duraprep and draped in a sterile field, and  her foot was draped into a  sterile impervious dressing. The leg was elevated and the tourniquet was  inflated to 350 mmHg.   A transverse incision was made 10 cm distal to the tibial tubercle. This  curved proximally and then a large posterior flap was developed.  The tibia  was cut 1 cm  proximal to the skin incision and the fibula was cut 1 cm  proximal to the tibia. A large knife was used to create a large posterior  flap.   The sciatic nerve was pulled, cut and allowed to retract. The vascular  bundles were suture ligated x3 each. The tourniquet was deflated after 11  minutes. Hemostasis was achieved.   The deep and superficial fascial layers were closed using #1 PDS. The skin  was closed with using Proximate staples. The wounds were covered with  Adaptic orthopedic sponges, Webril and a Coban dressing.   The patient was extubated. She was taken to the PACU in stable condition.  [name], M.D.    MVD/MEDQ  D:  07/18/2002  T:  07/19/2002  Job:  [id number]

## 2010-09-10 NOTE — Discharge Summary (Signed)
[name], [name]                          ACCOUNT NO.:  [id number]   MEDICAL RECORD NO.:  [id number]                   PATIENT TYPE:  INP   LOCATION:  5531                                 FACILITY:  [facility]   PHYSICIAN:  Billey Gosling, M.D.                 DATE OF BIRTH:  [birth year]   DATE OF ADMISSION:  07/17/2002  DATE OF DISCHARGE:  07/23/2002                                 DISCHARGE SUMMARY   DISCHARGE DIAGNOSES:  1. 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  2. Right foot ulcer.  3. Diabetes mellitus type 2 on insulin.  4. Hypertension.  5. Anemia.   PROCEDURES:  Left below the knee amputation on 07/18/03.   CONSULTATIONS:  Dr. Chales Abrahams of orthopedic surgery.   DISCHARGE MEDICATIONS:  1. Augmentin 875/125 mg 1 tablet b.i.d. x10 days.  2. Insulin 70/30 give 45 units q.a.m. and 35 units q.p.m.  3. Avandia 4 mg daily.  4. Ziac 5/6.25 mg one tablet daily.   DISCHARGE INSTRUCTIONS:  The patient is instructed to continue her diabetic  diet and record her blood sugars before each meal and at bedtime, then bring  to her follow visit with Dr. Clarene Duke.   FOLLOW UP:  The patient is to follow up with Dr. Lajoyce Corners in two weeks and the  office number was provided.  The patient also will call Dr. Clarene Duke for a  followup appointment and she requested to do that on her own.   HISTORY:  This 50 year old white female with a history of diabetes mellitus  type 2 for ten years and hypertension presented with a one-two week history  of left foot ulcer with worsening pain.  She was seen at [facility]  who sent her to [facility] for further evaluation.   HOSPITAL COURSE:  Problem 1.  Left foot osteomyelitis.  The patient was seen  by Dr. Lajoyce Corners of orthopedic surgery and underwent left below the knee  amputation for osteomyelitis of the left foot.  See the procedure report for  further details.  The patient was placed on  IV Zosyn perioperatively and  completed a five day course of that.  She will continue Augmentin for a  total of ten days.  The patient will receive a home health nurse and home  health PT to help with dressing changes as well as ambulation.   Problem 2.  Right foot ulcer.  The patient developed an ulcer on the right  foot.  An x-ray was negative for osteomyelitis.  She will be discharged home  with a Darco boot to that foot and will complete a ten day course of  Augmentin after being on IV Zosyn for five days.   Problem 3.  Diabetes mellitus type 2 not well controlled.  The patient was  placed on a sliding  scale insulin during hospitalization and blood sugars  remained in the 200s despite continual increase of her Lantus up to 48 units  per day.  It is discussed with the patient and decided to restart her 70/30  insulin as previously taken before switching to the Lantus.  She also  restarted on Avandia 4 mg daily.  On the day of discharge her fasting blood  sugar was 147 and she was instructed to check her blood sugars before each  meal and at bedtime and take it to her follow up visit.  It was stressed to  her the importance of keeping blood sugar control and will need follow up  closely.   Problem 4.  Anemia.  The patient had a hemoglobin of 11.4 on admission and  after surgery the hemoglobin went down to 9.0.  The patient was stable with  a hemoglobin of 10.2 prior to discharge.   Problem 5.  Hypertension.  The patient's blood pressure was adequately  controlled from the 120s to 140s systolic on her Ziac and we will continue  that as an outpatient.   LABORATORY DATA:  Hemoglobin A1C 10.9.  Urine culture negative.  Blood  cultures x2 negative to date.  A  lipid profile shows cholesterol 127,  triglycerides 208, HDL 19, LDL 66.                                               Billey Gosling, M.D.    AS/MEDQ  D:  07/23/2002  T:  07/23/2002  Job:  [id number]   cc:   [name],  M.D.  [address]  [place]  [place] [address]  Fax: [phone]

## 2010-09-27 ENCOUNTER — Ambulatory Visit: Payer: Self-pay | Admitting: Vascular Surgery

## 2010-10-01 ENCOUNTER — Ambulatory Visit: Payer: Self-pay | Admitting: Vascular Surgery

## 2010-10-01 HISTORY — PX: ARTERIOVENOUS GRAFT PLACEMENT: SUR1029

## 2010-12-01 ENCOUNTER — Emergency Department: Payer: Self-pay | Admitting: Internal Medicine

## 2011-01-05 ENCOUNTER — Encounter: Payer: Self-pay | Admitting: Gastroenterology

## 2011-01-20 LAB — BASIC METABOLIC PANEL
Calcium: 9.5
GFR calc Af Amer: 37 — ABNORMAL LOW
GFR calc non Af Amer: 31 — ABNORMAL LOW
Potassium: 4.5
Sodium: 131 — ABNORMAL LOW

## 2011-01-20 LAB — POCT HEMOGLOBIN-HEMACUE: Hemoglobin: 12.5

## 2011-02-02 ENCOUNTER — Ambulatory Visit: Payer: Self-pay | Admitting: Gastroenterology

## 2011-03-01 ENCOUNTER — Encounter (HOSPITAL_COMMUNITY): Payer: Self-pay | Admitting: *Deleted

## 2011-03-01 ENCOUNTER — Inpatient Hospital Stay (HOSPITAL_COMMUNITY)
Admission: EM | Admit: 2011-03-01 | Discharge: 2011-03-05 | DRG: 617 | Disposition: A | Discharge status: Home / Self Care | Payer: Medicare Other | Attending: Family Medicine | Admitting: Family Medicine

## 2011-03-01 ENCOUNTER — Emergency Department (HOSPITAL_COMMUNITY): Payer: Medicare Other

## 2011-03-01 DIAGNOSIS — E11621 Type 2 diabetes mellitus with foot ulcer: Secondary | ICD-10-CM | POA: Diagnosis present

## 2011-03-01 DIAGNOSIS — Z794 Long term (current) use of insulin: Secondary | ICD-10-CM

## 2011-03-01 DIAGNOSIS — S88119A Complete traumatic amputation at level between knee and ankle, unspecified lower leg, initial encounter: Secondary | ICD-10-CM

## 2011-03-01 DIAGNOSIS — E1165 Type 2 diabetes mellitus with hyperglycemia: Secondary | ICD-10-CM | POA: Diagnosis present

## 2011-03-01 DIAGNOSIS — D638 Anemia in other chronic diseases classified elsewhere: Secondary | ICD-10-CM | POA: Diagnosis present

## 2011-03-01 DIAGNOSIS — N186 End stage renal disease: Secondary | ICD-10-CM | POA: Diagnosis present

## 2011-03-01 DIAGNOSIS — I872 Venous insufficiency (chronic) (peripheral): Secondary | ICD-10-CM | POA: Diagnosis present

## 2011-03-01 DIAGNOSIS — I120 Hypertensive chronic kidney disease with stage 5 chronic kidney disease or end stage renal disease: Secondary | ICD-10-CM | POA: Diagnosis present

## 2011-03-01 DIAGNOSIS — I10 Essential (primary) hypertension: Secondary | ICD-10-CM | POA: Diagnosis present

## 2011-03-01 DIAGNOSIS — IMO0002 Reserved for concepts with insufficient information to code with codable children: Secondary | ICD-10-CM | POA: Diagnosis present

## 2011-03-01 DIAGNOSIS — D649 Anemia, unspecified: Secondary | ICD-10-CM | POA: Diagnosis present

## 2011-03-01 DIAGNOSIS — N179 Acute kidney failure, unspecified: Secondary | ICD-10-CM | POA: Diagnosis present

## 2011-03-01 DIAGNOSIS — D631 Anemia in chronic kidney disease: Secondary | ICD-10-CM | POA: Diagnosis present

## 2011-03-01 DIAGNOSIS — E1169 Type 2 diabetes mellitus with other specified complication: Principal | ICD-10-CM | POA: Diagnosis present

## 2011-03-01 DIAGNOSIS — M9080 Osteopathy in diseases classified elsewhere, unspecified site: Secondary | ICD-10-CM | POA: Diagnosis present

## 2011-03-01 DIAGNOSIS — M869 Osteomyelitis, unspecified: Secondary | ICD-10-CM | POA: Diagnosis present

## 2011-03-01 DIAGNOSIS — I739 Peripheral vascular disease, unspecified: Secondary | ICD-10-CM | POA: Diagnosis present

## 2011-03-01 DIAGNOSIS — Z992 Dependence on renal dialysis: Secondary | ICD-10-CM | POA: Diagnosis present

## 2011-03-01 DIAGNOSIS — N2581 Secondary hyperparathyroidism of renal origin: Secondary | ICD-10-CM | POA: Diagnosis present

## 2011-03-01 DIAGNOSIS — N039 Chronic nephritic syndrome with unspecified morphologic changes: Secondary | ICD-10-CM | POA: Diagnosis present

## 2011-03-01 DIAGNOSIS — Z886 Allergy status to analgesic agent status: Secondary | ICD-10-CM

## 2011-03-01 DIAGNOSIS — L0390 Cellulitis, unspecified: Secondary | ICD-10-CM

## 2011-03-01 DIAGNOSIS — Z880 Allergy status to penicillin: Secondary | ICD-10-CM

## 2011-03-01 HISTORY — DX: Unspecified kidney failure: N19

## 2011-03-01 LAB — BASIC METABOLIC PANEL
BUN: 48 mg/dL — ABNORMAL HIGH (ref 6–23)
Chloride: 97 mEq/L (ref 96–112)
GFR calc Af Amer: 7 mL/min — ABNORMAL LOW (ref >90)
GFR calc non Af Amer: 6 mL/min — ABNORMAL LOW (ref >90)
Potassium: 4.7 mEq/L (ref 3.5–5.1)
Sodium: 135 mEq/L (ref 135–145)

## 2011-03-01 LAB — CBC
HCT: 34 % — ABNORMAL LOW (ref 36.0–46.0)
Hemoglobin: 11.1 g/dL — ABNORMAL LOW (ref 12.0–15.0)
MCH: 28.5 pg (ref 26.0–34.0)
MCHC: 32.6 g/dL (ref 30.0–36.0)
MCV: 86.7 fL (ref 78.0–100.0)
Platelets: 183 10*3/uL (ref 150–400)
RBC: 3.9 MIL/uL (ref 3.87–5.11)
RDW: 15.3 % (ref 11.5–15.5)
WBC: 8.5 10*3/uL (ref 4.0–10.5)

## 2011-03-01 LAB — DIFFERENTIAL
Basophils Relative: 1 % (ref 0–1)
Lymphocytes Relative: 19 % (ref 12–46)
Monocytes Relative: 5 % (ref 3–12)
Neutro Abs: 6.2 10*3/uL (ref 1.7–7.7)
Neutrophils Relative %: 73 % (ref 43–77)

## 2011-03-01 LAB — GLUCOSE, CAPILLARY: Glucose-Capillary: 165 mg/dL — ABNORMAL HIGH (ref 70–99)

## 2011-03-01 MED ORDER — ONDANSETRON HCL 4 MG/2ML IJ SOLN
4.0000 mg | Freq: Four times a day (QID) | INTRAMUSCULAR | Status: DC | PRN
  Administered 2011-03-02 – 2011-03-05 (×2): 4 mg via INTRAVENOUS
  Filled 2011-03-01: qty 2

## 2011-03-01 MED ORDER — NEPRO/CARBSTEADY PO LIQD
237.0000 mL | ORAL | Status: DC | PRN
  Filled 2011-03-01: qty 237

## 2011-03-01 MED ORDER — LIDOCAINE-PRILOCAINE 2.5-2.5 % EX CREA
1.0000 "application " | TOPICAL_CREAM | CUTANEOUS | Status: DC | PRN
  Filled 2011-03-01: qty 5

## 2011-03-01 MED ORDER — ONDANSETRON HCL 4 MG PO TABS: 4.0000 mg | ORAL_TABLET | Freq: Four times a day (QID) | ORAL | Status: DC | PRN

## 2011-03-01 MED ORDER — INSULIN ASPART 100 UNIT/ML ~~LOC~~ SOLN
0.0000 [IU] | Freq: Three times a day (TID) | SUBCUTANEOUS | Status: DC
  Administered 2011-03-02 – 2011-03-03 (×3): 2 [IU] via SUBCUTANEOUS
  Administered 2011-03-03 – 2011-03-05 (×3): 3 [IU] via SUBCUTANEOUS
  Filled 2011-03-01: qty 3

## 2011-03-01 MED ORDER — HYDRALAZINE HCL 20 MG/ML IJ SOLN
10.0000 mg | Freq: Four times a day (QID) | INTRAMUSCULAR | Status: DC | PRN
  Filled 2011-03-01: qty 0.5

## 2011-03-01 MED ORDER — LIDOCAINE HCL (PF) 1 % IJ SOLN: 5.0000 mL | INTRAMUSCULAR | Status: DC | PRN

## 2011-03-01 MED ORDER — SODIUM CHLORIDE 0.9 % IV SOLN: 100.0000 mL | INTRAVENOUS | Status: DC | PRN

## 2011-03-01 MED ORDER — CLINDAMYCIN PHOSPHATE 600 MG/50ML IV SOLN
600.0000 mg | Freq: Once | INTRAVENOUS | Status: DC
  Filled 2011-03-01: qty 50

## 2011-03-01 MED ORDER — VANCOMYCIN HCL IN DEXTROSE 1-5 GM/200ML-% IV SOLN
1000.0000 mg | Freq: Once | INTRAVENOUS | Status: AC
  Administered 2011-03-01: 1000 mg via INTRAVENOUS

## 2011-03-01 MED ORDER — HEPARIN SODIUM (PORCINE) 1000 UNIT/ML DIALYSIS
1000.0000 [IU] | INTRAMUSCULAR | Status: DC | PRN
  Administered 2011-03-02: 1000 [IU] via INTRAVENOUS_CENTRAL
  Filled 2011-03-01: qty 1

## 2011-03-01 MED ORDER — DARBEPOETIN ALFA-POLYSORBATE 25 MCG/0.42ML IJ SOLN
25.0000 ug | INTRAMUSCULAR | Status: DC
  Filled 2011-03-01: qty 0.42

## 2011-03-01 MED ORDER — PENTAFLUOROPROP-TETRAFLUOROETH EX AERO
1.0000 "application " | INHALATION_SPRAY | CUTANEOUS | Status: DC | PRN
  Filled 2011-03-01: qty 103.5

## 2011-03-01 MED ORDER — ACETAMINOPHEN 650 MG RE SUPP: 650.0000 mg | Freq: Four times a day (QID) | RECTAL | Status: DC | PRN

## 2011-03-01 MED ORDER — ACETAMINOPHEN 325 MG PO TABS
650.0000 mg | ORAL_TABLET | Freq: Four times a day (QID) | ORAL | Status: DC | PRN
  Administered 2011-03-02: 650 mg via ORAL
  Filled 2011-03-01 (×2): qty 2

## 2011-03-01 MED ORDER — INSULIN ASPART 100 UNIT/ML ~~LOC~~ SOLN: 0.0000 [IU] | Freq: Every day | SUBCUTANEOUS | Status: DC

## 2011-03-01 MED ORDER — HEPARIN SODIUM (PORCINE) 1000 UNIT/ML DIALYSIS
1000.0000 [IU] | INTRAMUSCULAR | Status: DC | PRN
  Administered 2011-03-02: 4.2 [IU] via INTRAVENOUS_CENTRAL
  Filled 2011-03-01: qty 1

## 2011-03-01 MED ORDER — HEPARIN SODIUM (PORCINE) 5000 UNIT/ML IJ SOLN
5000.0000 [IU] | Freq: Three times a day (TID) | INTRAMUSCULAR | Status: DC
  Administered 2011-03-01 – 2011-03-05 (×6): 5000 [IU] via SUBCUTANEOUS
  Filled 2011-03-01 (×14): qty 1

## 2011-03-01 MED ORDER — PARICALCITOL 5 MCG/ML IV SOLN
2.0000 ug | INTRAVENOUS | Status: DC
  Administered 2011-03-05: 2 ug via INTRAVENOUS
  Filled 2011-03-01 (×2): qty 0.4

## 2011-03-01 NOTE — ED Provider Notes (Signed)
4:43 PM Patient care was assumed from Dr. Clarene Duke. Internal medicine has been paged to admit patient for possible osteomyelitis treatment. Of note she has an acute on chronic renal failure. Labs listed below.  [place], [place] 03/01/11 [phone]

## 2011-03-01 NOTE — ED Notes (Signed)
Received patient and assumed care, received patient with 20g to left hand, pt denies pain or discomfort, pending re  evaluation

## 2011-03-01 NOTE — Consult Note (Signed)
Reason for Consult:dialysis   Referring Physician: Dr. Tomasita Crumble is an 50 y.o. female.  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  Pt was admitted for IV antibiotics and we were asked to see pt to help arrange for HD.  Dialyzes at [facility] on TTS. Primary Nephrologist Kennley Schwandt. EDW 95kg. HD Bath 2K/2.25 Ca, Dialyzer F180, Heparin 4,800. Access R IJ cath and maturing left AVF.  Past Medical History  Diagnosis Date  . Hypertension   . Anemia   . Diabetes mellitus    ESRD    Secondary HPTH    Anemia of chronic disease    PVD s/p Left BKA   . Diabetic toe ulcer     Past Surgical History  Procedure Date  . Leg amputation below knee     left  . Insertion of dialysis catheter   . Arteriovenous graft placement   . Below knee leg amputation     No family history on file.  Social History:  reports that she has never smoked. She does not have any smokeless tobacco history on file. She reports that she does not drink alcohol or use illicit drugs.  Allergies:  Allergies  Allergen Reactions  . Codeine   . Levaquin   . Penicillins     Medications:  I have reviewed the patient's current medications. Prior to Admission:  Prescriptions prior to admission  Medication Sig Dispense Refill  . bisoprolol-hydrochlorothiazide (ZIAC) 5-6.25 MG per tablet Take 1 tablet by mouth daily.       . calcium acetate, Phos Binder, (PHOSLYRA) 667 MG/5ML SOLN Take 1,334 mg by mouth 3 (three) times daily with meals.        . insulin aspart (NOVOLOG) 100 UNIT/ML injection Inject 8 Units into the skin daily.        . insulin glargine (LANTUS) 100 UNIT/ML injection Inject 12 Units into the skin every evening.         Zemplar . Epogen 6400   Results for orders placed during the hospital encounter of 03/01/11 (from the past 48 hour(s))  BASIC  METABOLIC PANEL     Status: Abnormal   Collection Time   03/01/11  1:18 PM      Component Value Range Comment   Sodium 135  135 - 145 (mEq/L)    Potassium 4.7  3.5 - 5.1 (mEq/L)    Chloride 97  96 - 112 (mEq/L)    CO2 22  19 - 32 (mEq/L)    Glucose, Bld 124 (*) 70 - 99 (mg/dL)    BUN 48 (*) 6 - 23 (mg/dL)    Creatinine, Ser 1.61 (*) 0.50 - 1.10 (mg/dL)    Calcium 9.0  8.4 - 10.5 (mg/dL)    GFR calc non Af Amer 6 (*) >90 (mL/min)    GFR calc Af Amer 7 (*) >90 (mL/min)   CBC     Status: Abnormal   Collection Time   03/01/11  1:18 PM      Component Value Range Comment   WBC 8.5  4.0 - 10.5 (K/uL)    RBC 3.90  3.87 - 5.11 (MIL/uL)    Hemoglobin 11.1 (*) 12.0 - 15.0 (g/dL)    HCT 09.6 (*) 04.5 - 46.0 (%)    MCV 87.2  78.0 - 100.0 (fL)  MCH 28.5  26.0 - 34.0 (pg)    MCHC 32.6  30.0 - 36.0 (g/dL)    RDW 40.9  81.1 - 91.4 (%)    Platelets 181  150 - 400 (K/uL)   DIFFERENTIAL     Status: Normal   Collection Time   03/01/11  1:18 PM      Component Value Range Comment   Neutrophils Relative 73  43 - 77 (%)    Neutro Abs 6.2  1.7 - 7.7 (K/uL)    Lymphocytes Relative 19  12 - 46 (%)    Lymphs Abs 1.6  0.7 - 4.0 (K/uL)    Monocytes Relative 5  3 - 12 (%)    Monocytes Absolute 0.4  0.1 - 1.0 (K/uL)    Eosinophils Relative 2  0 - 5 (%)    Eosinophils Absolute 0.2  0.0 - 0.7 (K/uL)    Basophils Relative 1  0 - 1 (%)    Basophils Absolute 0.0  0.0 - 0.1 (K/uL)   LACTIC ACID, PLASMA     Status: Normal   Collection Time   03/01/11  1:18 PM      Component Value Range Comment   Lactic Acid, Venous 1.1  0.5 - 2.2 (mmol/L)     Dg Toe Great Right  03/01/2011  *RADIOLOGY REPORT*  Clinical Data: Open wound.  RIGHT TOE - 2+ VIEW  Comparison: None  Findings: Soft tissue thickening and irregularity noted.  There is destructive bony change involving the distal tuft region of the distal phalanx suggesting osteomyelitis.  Joint spaces are maintained.  IMPRESSION: Plain film findings suspicious for  osteomyelitis involving the distal tuft region of the distal phalanx.  Original Report Authenticated By: P. Loralie Champagne, M.D.    ROS: denies f/c/n/v/cp/palps/orthopnea/sob/hemoptysis/melena/brbpr/dysuria/pyuria/arthralgias/myalgias Blood pressure 172/100, pulse 62, temperature 98 F (36.7 C), temperature source Oral, resp. rate 20, height 5\' 3"  (1.6 m), weight 97.977 kg (216 lb), SpO2 100.00%. General appearance: alert, cooperative and no distress Neck: no adenopathy, no carotid bruit, no JVD, supple, symmetrical, trachea midline and thyroid not enlarged, symmetric, no tenderness/mass/nodules Resp: clear to auscultation bilaterally Cardio: regular rate and rhythm, S1, S2 normal, no murmur, click, rub or gallop GI: soft, non-tender; bowel sounds normal; no masses,  no organomegaly Extremities: no edema, left AVF +T/B, ulceration at base of right great toe  Assessment/Plan: 1 Diabetic foot ulcer- on Vanco and clinda.  Await ortho eval regarding amputation vs abx but can continue these meds as an outpt if surgery not indicated as pt is asymptomatic 2 ESRD: off schedule, plan for HD tomorrow and continue 3 tx/week 3 Hypertension: stable 4. Anemia of ESRD: continue with epo 5. Metabolic Bone Disease: continue with binders and low phos diet 6. Dispo- d/c to home pending decision on surgical intervention vs conservative Abx coverage  Nijah Tejera A 03/01/2011, 8:51 PM

## 2011-03-01 NOTE — ED Provider Notes (Signed)
History     CSN: [id number] Arrival date & time: 03/01/2011 11:11 AM   First MD Initiated Contact with Patient 03/01/11 1304      Chief Complaint  Patient presents with  . Cellulitis    HPI Pt was seen at 1305.  Per pt, c/o gradual onset and persistence of constant right great toe "rash" and "ulcer" that began 2d ago.  Pt states she was eval by her Podiatrist today, who sent her to the ED for further eval, and possible admit for IV abx.  Denies fevers, no CP/SOB, no red streaking up RLE, no focal motor weakness, no tingling/numbness in extremity, no injury.   Past Medical History  Diagnosis Date  . Hypertension   . Anemia   . Diabetes mellitus   . Renal failure     Past Surgical History  Procedure Date  . Leg amputation below knee     left  . Insertion of dialysis catheter   . Arteriovenous graft placement   . Below knee leg amputation     History  Substance Use Topics  . Smoking status: Never Smoker   . Smokeless tobacco: Not on file  . Alcohol Use: No    Review of Systems ROS: Statement: All systems negative except as marked or noted in the HPI; Constitutional: Negative for fever and chills. ; ; Eyes: Negative for eye pain, redness and discharge. ; ; ENMT: Negative for ear pain, hoarseness, nasal congestion, sinus pressure and sore throat. ; ; Cardiovascular: Negative for chest pain, palpitations, diaphoresis, dyspnea and peripheral edema. ; ; Respiratory: Negative for cough, wheezing and stridor. ; ; Gastrointestinal: Negative for nausea, vomiting, diarrhea and abdominal pain, blood in stool, hematemesis, jaundice and rectal bleeding. . ; ; Genitourinary: Negative for dysuria, flank pain and hematuria. ; ; Musculoskeletal: Negative for back pain and neck pain. Negative for swelling and trauma.; ; Skin: Negative for pruritus, abrasions, blisters, bruising and +rash and skin lesion right great toe.; ; Neuro: Negative for headache, lightheadedness and neck stiffness. Negative  for weakness, altered level of consciousness , altered mental status, extremity weakness, paresthesias, involuntary movement, seizure and syncope.     Allergies  Codeine; Levaquin; and Penicillins  Home Medications   Current Outpatient Rx  Name Route Sig Dispense Refill  . BISOPROLOL-HYDROCHLOROTHIAZIDE 5-6.25 MG PO TABS Oral Take 1 tablet by mouth daily.     Marland Kitchen CALCIUM ACETATE (PHOS BINDER) 667 MG/5ML PO SOLN Oral Take 1,334 mg by mouth 3 (three) times daily with meals.      . INSULIN ASPART 100 UNIT/ML [place] SOLN Subcutaneous Inject 8 Units into the skin daily.      . INSULIN GLARGINE 100 UNIT/ML [place] SOLN Subcutaneous Inject 12 Units into the skin every evening.        BP 160/89  Pulse 57  Temp(Src) 97.6 F (36.4 C) (Oral)  Resp 16  Ht 5\' 3"  (1.6 m)  Wt 216 lb (97.977 kg)  BMI 38.26 kg/m2  SpO2 95%  Physical Exam 1310: Physical examination:  Nursing notes reviewed; Vital signs and O2 SAT reviewed;  Constitutional: Well developed, Well nourished, Well hydrated, In no acute distress; Head:  Normocephalic, atraumatic; Eyes: EOMI, PERRL, No scleral icterus; ENMT: Mouth and pharynx normal, Mucous membranes moist; Neck: Supple, Full range of motion, No lymphadenopathy; Cardiovascular: Regular rate and rhythm, No murmur, rub, or gallop; Respiratory: Breath sounds clear & equal bilaterally, No rales, rhonchi, wheezes, or rub, Normal respiratory effort/excursion; Chest: Nontender, Movement normal; Abdomen: Soft, Nontender, Nondistended,  Normal bowel sounds; Extremities: Pulses normal, No tenderness, No calf edema. Right foot with well healed surgical scar. +right great toe with mild erythema and small area of ulceration/scab to toetip, +left BKA per hx.; Neuro: AA&Ox3, Major CN grossly intact.  No gross focal motor or sensory deficits in extremities.; Skin: Color normal, Warm, Dry   ED Course  Procedures  1330: Pt to be moved to CDU while awaiting labs and XR.  Likely will need abx and admit.   Report given to PA [place] and PA Paz.   MDM  MDM Reviewed: nursing note and vitals Interpretation: labs and x-ray   Results for orders placed during the hospital encounter of 03/01/11  BASIC METABOLIC PANEL      Component Value Range   Sodium 135  135 - 145 (mEq/L)   Potassium 4.7  3.5 - 5.1 (mEq/L)   Chloride 97  96 - 112 (mEq/L)   CO2 22  19 - 32 (mEq/L)   Glucose, Bld 124 (*) 70 - 99 (mg/dL)   BUN 48 (*) 6 - 23 (mg/dL)   Creatinine, Ser 1.61 (*) 0.50 - 1.10 (mg/dL)   Calcium 9.0  8.4 - 09.6 (mg/dL)   GFR calc non Af Amer 6 (*) >90 (mL/min)   GFR calc Af Amer 7 (*) >90 (mL/min)  CBC      Component Value Range   WBC 8.5  4.0 - 10.5 (K/uL)   RBC 3.90  3.87 - 5.11 (MIL/uL)   Hemoglobin 11.1 (*) 12.0 - 15.0 (g/dL)   HCT 04.5 (*) 40.9 - 46.0 (%)   MCV 87.2  78.0 - 100.0 (fL)   MCH 28.5  26.0 - 34.0 (pg)   MCHC 32.6  30.0 - 36.0 (g/dL)   RDW 81.1  91.4 - 78.2 (%)   Platelets 181  150 - 400 (K/uL)  DIFFERENTIAL      Component Value Range   Neutrophils Relative 73  43 - 77 (%)   Neutro Abs 6.2  1.7 - 7.7 (K/uL)   Lymphocytes Relative 19  12 - 46 (%)   Lymphs Abs 1.6  0.7 - 4.0 (K/uL)   Monocytes Relative 5  3 - 12 (%)   Monocytes Absolute 0.4  0.1 - 1.0 (K/uL)   Eosinophils Relative 2  0 - 5 (%)   Eosinophils Absolute 0.2  0.0 - 0.7 (K/uL)   Basophils Relative 1  0 - 1 (%)   Basophils Absolute 0.0  0.0 - 0.1 (K/uL)  LACTIC ACID, PLASMA      Component Value Range   Lactic Acid, Venous 1.1  0.5 - 2.2 (mmol/L)   Dg Toe Great Right  03/01/2011  *RADIOLOGY REPORT*  Clinical Data: Open wound.  RIGHT TOE - 2+ VIEW  Comparison: None  Findings: Soft tissue thickening and irregularity noted.  There is destructive bony change involving the distal tuft region of the distal phalanx suggesting osteomyelitis.  Joint spaces are maintained.  IMPRESSION: Plain film findings suspicious for osteomyelitis involving the distal tuft region of the distal phalanx.  Original Report Authenticated  By: P. Loralie Champagne, M.D.      [facility] [address] [place], [place] 03/03/11 [phone]

## 2011-03-01 NOTE — ED Notes (Signed)
Lab drawing blood cultures, husband at bedside

## 2011-03-01 NOTE — ED Notes (Signed)
Patient has hx of diabetes.  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  Today patient has redness noted on her right great toe.  Her md sent her to the ED for further eval

## 2011-03-01 NOTE — H&P (Signed)
PCP:  Loma Sender in [place]  Nephrologist: [facility]  Chief Complaint:  Sent from Podiatrist for ulcer on right great toe  HPI: This is a 50 year old female with a past medical history of hypertension diabetes and end-stage kidney disease on hemodialysis.  The patient went to see a podiatrist today (Dr. Leeanne Deed) for an ulcer on her right foot. She states that there was "hard skin present there" which peeled off and resulted in an ulcer. She states that the ulcer is been there for the past 2 days. She has not noticed any discharge from the ulcer. She has not noticed any redness or increased swelling of her toe or foot. She has not had any fever or chills.She does not complain of any pain or numbness in her feet. As far as she knows she does not have neuropathy.  After evaluation in the podiatrist's office she was sent to the hospital.   Review of Systems:   General: No weight loss or fatigue. No fevers or chills. HEENT: No blurred vision or double vision. No sore throat, sinus trouble, ear ache or loss of hearing. Lungs: No shortness of breath cough or wheezing. CVS: No chest pain, palpitations. She does have problems with pedal edema. No orthopnea. GI: No nausea vomiting diarrhea or constipation. GU: She still makes urine. She has been on dialysis for 9 months. No complaints of dysuria or hematuria. Hematologic: Bruises easily Skin: Ulcer as mentioned in H&P. Neurologic: No history of strokes or seizures. Psychologic: no anxiety or depression.  Past Medical History: Past Medical History  Diagnosis Date  . Hypertension   . Anemia   . Diabetes mellitus   .  end stage renal disease on hemodialysis Tuesday Thursday and Saturday. She has been on dialysis for 9 months.     Past Surgical History  Procedure Date  . 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 Insertion of dialysis catheter-right subclavian    . Arteriovenous graft placement-left arm           Medications: Prior to Admission medications   Medication Sig Start Date End Date Taking? Authorizing Provider  bisoprolol-hydrochlorothiazide (ZIAC) 5-6.25 MG per tablet Take 1 tablet by mouth daily.    Yes Historical Provider, MD  calcium acetate, Phos Binder, (PHOSLYRA) 667 MG/5ML SOLN Take 1,334 mg by mouth 3 (three) times daily with meals.     Yes Historical Provider, MD  insulin aspart (NOVOLOG) 100 UNIT/ML injection Inject 8 Units into the skin daily.     Yes Historical Provider, MD  insulin glargine (LANTUS) 100 UNIT/ML injection Inject 12 Units into the skin every evening.     Yes Historical Provider, MD    Allergies:   Allergies  Allergen Reactions  . Codeine  makes her crazy   . Levaquin  whelps   . Penicillins  whelps     Social History:  reports that she has never smoked. She does not have any smokeless tobacco history on file. She reports that she does not drink alcohol or use illicit drugs. She is on disability. She lives with her husband.  Family History: Her mother had diabetes.  Physical Exam: Filed Vitals:   03/01/11 1119 03/01/11 1238 03/01/11 1621  BP: 176/74 160/89 172/100  Pulse: 64 57 62  Temp: 97.6 F (36.4 C)  98 F (36.7 C)  TempSrc: Oral  Oral  Resp: 22 16 20   Height: 5\' 3"  (1.6 m)    Weight: 97.977 kg (216  lb)    SpO2: 99% 95% 100%   General appearance: alert, cooperative and morbidly obese Head: Normocephalic, without obvious abnormality, atraumatic Eyes: conjunctivae/corneas clear. PERRL, EOM's intact. Fundi benign. Throat: lips, mucosa, and tongue normal; teeth and gums normal Resp: clear to auscultation bilaterally Cardio: regular rate and rhythm, S1, S2 normal, no murmur, click, rub or gallop GI: soft, non-tender; bowel sounds normal; no masses,  no organomegaly Extremities: edema rt leg  Skin: ulcer on Rt first toe, plantar aspect, partially necrotic. Pus can be expressed but not much. No surrounding cellulitis.  Also has a rash  on rt shin- erythematous with scattered petechia. Nonblanching. She states it has been present  for years. Neurologic: Grossly normal    Labs on Admission:   Basename 03/01/11 1318  NA 135  K 4.7  CL 97  CO2 22  GLUCOSE 124*  BUN 48*  CREATININE 7.43*  CALCIUM 9.0  MG --  PHOS --   No results found for this basename: AST:2,ALT:2,ALKPHOS:2,BILITOT:2,PROT:2,ALBUMIN:2 in the last 72 hours No results found for this basename: LIPASE:2,AMYLASE:2 in the last 72 hours  Basename 03/01/11 1318  WBC 8.5  NEUTROABS 6.2  HGB 11.1*  HCT 34.0*  MCV 87.2  PLT 181   No results found for this basename: CKTOTAL:3,CKMB:3,CKMBINDEX:3,TROPONINI:3 in the last 72 hours No results found for this basename: TSH,T4TOTAL,FREET3,T3FREE,THYROIDAB in the last 72 hours No results found for this basename: VITAMINB12:2,FOLATE:2,FERRITIN:2,TIBC:2,IRON:2,RETICCTPCT:2 in the last 72 hours  Radiological Exams on Admission: Dg Toe Great Right  03/01/2011  *RADIOLOGY REPORT*  Clinical Data: Open wound.  RIGHT TOE - 2+ VIEW  Comparison: None  Findings: Soft tissue thickening and irregularity noted.  There is destructive bony change involving the distal tuft region of the distal phalanx suggesting osteomyelitis.  Joint spaces are maintained.  IMPRESSION: Plain film findings suspicious for osteomyelitis involving the distal tuft region of the distal phalanx.  Original Report Authenticated By: P. Loralie Champagne, M.D.    EKG: No orders found for this or any previous visit.  Assessment/Plan  Diabetic foot ulcer/ Osteomyelitis -I suspect the patient had a callus in this area which peeled and subsequently became infected. X-ray shows underlying osteomyelitis of the tip of distal phalanx. I will be starting vancomycin and clindamycin. I have ordered a wound culture and blood cultures. I will also request that orthopedic surgery evaluate for possible debridement.  Diabetes mellitus-resume home meds and also place on a  sliding scale a.c. and at bedtime   HTN (hypertension)-resume home medication. Currently her blood pressure is high despite taking her usual medication. I will order when necessary IV hydralazine.   .Anemia .ESRD (end stage renal disease) on dialysis-contacted nephrology regarding this.  .Obesity, morbid   Joya Salm 03/01/2011, 6:50 PM

## 2011-03-02 ENCOUNTER — Inpatient Hospital Stay (HOSPITAL_COMMUNITY): Payer: Medicare Other

## 2011-03-02 LAB — CBC
MCH: 28 pg (ref 26.0–34.0)
MCHC: 32.2 g/dL (ref 30.0–36.0)
MCV: 86.8 fL (ref 78.0–100.0)
Platelets: 174 10*3/uL (ref 150–400)
RDW: 15.4 % (ref 11.5–15.5)

## 2011-03-02 LAB — BASIC METABOLIC PANEL
BUN: 53 mg/dL — ABNORMAL HIGH (ref 6–23)
CO2: 21 mEq/L (ref 19–32)
Calcium: 8.5 mg/dL (ref 8.4–10.5)
Creatinine, Ser: 7.96 mg/dL — ABNORMAL HIGH (ref 0.50–1.10)
Glucose, Bld: 111 mg/dL — ABNORMAL HIGH (ref 70–99)

## 2011-03-02 LAB — GLUCOSE, CAPILLARY
Glucose-Capillary: 99 mg/dL (ref 70–99)
Glucose-Capillary: 137 mg/dL — ABNORMAL HIGH (ref 70–99)
Glucose-Capillary: 184 mg/dL — ABNORMAL HIGH (ref 70–99)

## 2011-03-02 MED ORDER — PARICALCITOL 5 MCG/ML IV SOLN
INTRAVENOUS | Status: AC
  Administered 2011-03-02: 2 ug
  Filled 2011-03-02: qty 1

## 2011-03-02 MED ORDER — DARBEPOETIN ALFA-POLYSORBATE 25 MCG/0.42ML IJ SOLN
INTRAMUSCULAR | Status: AC
  Administered 2011-03-02: 25 ug
  Filled 2011-03-02: qty 0.42

## 2011-03-02 MED ORDER — AMLODIPINE BESYLATE 10 MG PO TABS
10.0000 mg | ORAL_TABLET | Freq: Every day | ORAL | Status: DC
  Administered 2011-03-02 – 2011-03-05 (×4): 10 mg via ORAL
  Filled 2011-03-02 (×4): qty 1

## 2011-03-02 NOTE — Consult Note (Signed)
Reason for Consult: Osteomyelitis right great toe with Wagner grade 3 ulcer Referring Physician: dr Tomasita Crumble is an 50 y.o. female.  HPI: Patient presents with a chronic ulcer of the right great toe. She was most recently seen by her podiatrist the ulcer was unroofed and a large necrotic ulcer was identified. Patient was sent to the emergency room for evaluation and treatment.  Past Medical History  Diagnosis Date  . Hypertension   . Anemia   . Diabetes mellitus   . Renal failure     Past Surgical History  Procedure Date  . Leg amputation below knee     left  . Insertion of dialysis catheter   . Arteriovenous graft placement   . Below knee leg amputation     No family history on file.  Social History:  reports that she has never smoked. She does not have any smokeless tobacco history on file. She reports that she does not drink alcohol or use illicit drugs.  Allergies:  Allergies  Allergen Reactions  . Codeine   . Levaquin   . Penicillins     Medications: I have reviewed the patient's current medications.  Results for orders placed during the hospital encounter of 03/01/11 (from the past 48 hour(s))  BASIC METABOLIC PANEL     Status: Abnormal   Collection Time   03/01/11  1:18 PM      Component Value Range Comment   Sodium 135  135 - 145 (mEq/L)    Potassium 4.7  3.5 - 5.1 (mEq/L)    Chloride 97  96 - 112 (mEq/L)    CO2 22  19 - 32 (mEq/L)    Glucose, Bld 124 (*) 70 - 99 (mg/dL)    BUN 48 (*) 6 - 23 (mg/dL)    Creatinine, Ser 1.32 (*) 0.50 - 1.10 (mg/dL)    Calcium 9.0  8.4 - 10.5 (mg/dL)    GFR calc non Af Amer 6 (*) >90 (mL/min)    GFR calc Af Amer 7 (*) >90 (mL/min)   CBC     Status: Abnormal   Collection Time   03/01/11  1:18 PM      Component Value Range Comment   WBC 8.5  4.0 - 10.5 (K/uL)    RBC 3.90  3.87 - 5.11 (MIL/uL)    Hemoglobin 11.1 (*) 12.0 - 15.0 (g/dL)    HCT 44.0 (*) 10.2 - 46.0 (%)    MCV 87.2  78.0 - 100.0 (fL)    MCH 28.5   26.0 - 34.0 (pg)    MCHC 32.6  30.0 - 36.0 (g/dL)    RDW 72.5  36.6 - 44.0 (%)    Platelets 181  150 - 400 (K/uL)   DIFFERENTIAL     Status: Normal   Collection Time   03/01/11  1:18 PM      Component Value Range Comment   Neutrophils Relative 73  43 - 77 (%)    Neutro Abs 6.2  1.7 - 7.7 (K/uL)    Lymphocytes Relative 19  12 - 46 (%)    Lymphs Abs 1.6  0.7 - 4.0 (K/uL)    Monocytes Relative 5  3 - 12 (%)    Monocytes Absolute 0.4  0.1 - 1.0 (K/uL)    Eosinophils Relative 2  0 - 5 (%)    Eosinophils Absolute 0.2  0.0 - 0.7 (K/uL)    Basophils Relative 1  0 - 1 (%)    Basophils  Absolute 0.0  0.0 - 0.1 (K/uL)   LACTIC ACID, PLASMA     Status: Normal   Collection Time   03/01/11  1:18 PM      Component Value Range Comment   Lactic Acid, Venous 1.1  0.5 - 2.2 (mmol/L)   WOUND CULTURE     Status: Normal (Preliminary result)   Collection Time   03/01/11  7:01 PM      Component Value Range Comment   Specimen Description WOUND TOE      Special Requests NONE      Gram Stain        Value: ABUNDANT WBC PRESENT, PREDOMINANTLY PMN     RARE SQUAMOUS EPITHELIAL CELLS PRESENT     ABUNDANT GRAM POSITIVE COCCI IN PAIRS     IN CLUSTERS RARE GRAM NEGATIVE RODS   Culture Culture reincubated for better growth      Report Status PENDING     GLUCOSE, CAPILLARY     Status: Abnormal   Collection Time   03/01/11 10:24 PM      Component Value Range Comment   Glucose-Capillary 165 (*) 70 - 99 (mg/dL)   CBC     Status: Abnormal   Collection Time   03/01/11 10:28 PM      Component Value Range Comment   WBC 7.2  4.0 - 10.5 (K/uL)    RBC 3.83 (*) 3.87 - 5.11 (MIL/uL)    Hemoglobin 10.9 (*) 12.0 - 15.0 (g/dL)    HCT 40.9 (*) 81.1 - 46.0 (%)    MCV 86.7  78.0 - 100.0 (fL)    MCH 28.5  26.0 - 34.0 (pg)    MCHC 32.8  30.0 - 36.0 (g/dL)    RDW 91.4  78.2 - 95.6 (%)    Platelets 183  150 - 400 (K/uL)   CREATININE, SERUM     Status: Abnormal   Collection Time   03/01/11 10:28 PM      Component Value Range  Comment   Creatinine, Ser 7.52 (*) 0.50 - 1.10 (mg/dL)    GFR calc non Af Amer 6 (*) >90 (mL/min)    GFR calc Af Amer 7 (*) >90 (mL/min)   BASIC METABOLIC PANEL     Status: Abnormal   Collection Time   03/02/11  6:10 AM      Component Value Range Comment   Sodium 136  135 - 145 (mEq/L)    Potassium 5.2 (*) 3.5 - 5.1 (mEq/L)    Chloride 99  96 - 112 (mEq/L)    CO2 21  19 - 32 (mEq/L)    Glucose, Bld 111 (*) 70 - 99 (mg/dL)    BUN 53 (*) 6 - 23 (mg/dL)    Creatinine, Ser 2.13 (*) 0.50 - 1.10 (mg/dL)    Calcium 8.5  8.4 - 10.5 (mg/dL)    GFR calc non Af Amer 5 (*) >90 (mL/min)    GFR calc Af Amer 6 (*) >90 (mL/min)   CBC     Status: Abnormal   Collection Time   03/02/11  6:10 AM      Component Value Range Comment   WBC 6.7  4.0 - 10.5 (K/uL)    RBC 3.72 (*) 3.87 - 5.11 (MIL/uL)    Hemoglobin 10.4 (*) 12.0 - 15.0 (g/dL)    HCT 08.6 (*) 57.8 - 46.0 (%)    MCV 86.8  78.0 - 100.0 (fL)    MCH 28.0  26.0 - 34.0 (pg)    MCHC  32.2  30.0 - 36.0 (g/dL)    RDW 11.9  14.7 - 82.9 (%)    Platelets 174  150 - 400 (K/uL)     Dg Toe Great Right  03/01/2011  *RADIOLOGY REPORT*  Clinical Data: Open wound.  RIGHT TOE - 2+ VIEW  Comparison: None  Findings: Soft tissue thickening and irregularity noted.  There is destructive bony change involving the distal tuft region of the distal phalanx suggesting osteomyelitis.  Joint spaces are maintained.  IMPRESSION: Plain film findings suspicious for osteomyelitis involving the distal tuft region of the distal phalanx.  Original Report Authenticated By: P. Loralie Champagne, M.D.    ROS Blood pressure 165/90, pulse 68, temperature 98 F (36.7 C), temperature source Oral, resp. rate 20, height 5\' 3"  (1.6 m), weight 97.1 kg (214 lb 1.1 oz), SpO2 98.00%. Physical Exam on exam patient has a left transtibial amputation. This has been stable for about 8 years. Examination of the left transtibial amputation shows her to be no ulcerations no skin breakdown. Examination  the right lower extremity she has a good dorsalis pedis and posterior tibial pulse. She has no extending cellulitis no lymphedema. She does have venous stasis insufficiency with brawny skin color changes and dermatitis of the tibia with pitting edema. Examination of the toe she is a Wagner grade 3 ulcer which probes to bone. 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  Assessment/Plan: #1 stable left transtibial amputation.  #2 osteomyelitis with a Wagner grade 3 ulcer right great toe.  #3 venous stasis insufficiency with brawny edema and ulceration right lower extremity.  Plan we will schedule her for an amputation of the right great toe. We will need to set her up for compressive wraps for the venous stasis edema in the right lower extremity.  Thorvald Orsino V 03/02/2011, 7:56 AM

## 2011-03-02 NOTE — Progress Notes (Signed)
Subjective: Patient seen and examined, Dr Lajoyce Corners has seen the patient and is planning  amputation pf right great toe.  Objective: Vital signs in last 24 hours: Temp:  [97 F (36.1 C)-98.7 F (37.1 C)] 97.9 F (36.6 C) (11/07 1400) Pulse Rate:  [63-90] 90  (11/07 1400) Resp:  [16-22] 18  (11/07 1400) BP: (149-190)/(72-97) 173/91 mmHg (11/07 1400) SpO2:  [94 %-98 %] 97 % (11/07 1400) Weight:  [94.1 kg (207 lb 7.3 oz)-97.1 kg (214 lb 1.1 oz)] 207 lb 7.3 oz (94.1 kg) (11/07 1154) Weight change:  Last BM Date: 02/26/11  Intake/Output from previous day:   Total I/O In: 150 [P.O.:120; I.V.:30] Out: 2875 [Other:2875]   Physical Exam: General: Alert, awake, oriented x3, in no acute distress. HEENT: No bruits, no goiter. Heart: Regular rate and rhythm, without murmurs, rubs, gallops. Lungs: Clear to auscultation bilaterally. Abdomen: Soft, nontender, nondistended, positive bowel sounds. Extremities: Trace edema Neuro: Grossly intact, nonfocal.    Lab Results: Basic Metabolic Panel:  Basename 03/02/11 0610 03/01/11 2228 03/01/11 1318  NA 136 -- 135  K 5.2* -- 4.7  CL 99 -- 97  CO2 21 -- 22  GLUCOSE 111* -- 124*  BUN 53* -- 48*  CREATININE 7.96* 7.52* --  CALCIUM 8.5 -- 9.0  MG -- -- --  PHOS -- -- --   CBC:  Basename 03/02/11 0610 03/01/11 2228 03/01/11 1318  WBC 6.7 7.2 --  NEUTROABS -- -- 6.2  HGB 10.4* 10.9* --  HCT 32.3* 33.2* --  MCV 86.8 86.7 --  PLT 174 183 --    Recent Results (from the past 240 hour(s))  CULTURE, BLOOD (ROUTINE X 2)     Status: Normal (Preliminary result)   Collection Time   03/01/11  5:55 PM      Component Value Range Status Comment   Specimen Description BLOOD ARM RIGHT   Final    Special Requests BOTTLES DRAWN AEROBIC ONLY 3CC   Final    Setup Time [id number]   Final    Culture     Final    Value:        BLOOD CULTURE RECEIVED NO GROWTH TO DATE CULTURE WILL BE HELD FOR 5 DAYS BEFORE ISSUING A FINAL NEGATIVE REPORT   Report Status  PENDING   Incomplete   WOUND CULTURE     Status: Normal (Preliminary result)   Collection Time   03/01/11  7:01 PM      Component Value Range Status Comment   Specimen Description WOUND TOE   Final    Special Requests NONE   Final    Gram Stain     Final    Value: ABUNDANT WBC PRESENT, PREDOMINANTLY PMN     RARE SQUAMOUS EPITHELIAL CELLS PRESENT     ABUNDANT GRAM POSITIVE COCCI IN PAIRS     IN CLUSTERS RARE GRAM NEGATIVE RODS   Culture Culture reincubated for better growth   Final    Report Status PENDING   Incomplete     Studies/Results: Dg Toe Great Right  03/01/2011  *RADIOLOGY REPORT*  Clinical Data: Open wound.  RIGHT TOE - 2+ VIEW  Comparison: None  Findings: Soft tissue thickening and irregularity noted.  There is destructive bony change involving the distal tuft region of the distal phalanx suggesting osteomyelitis.  Joint spaces are maintained.  IMPRESSION: Plain film findings suspicious for osteomyelitis involving the distal tuft region of the distal phalanx.  Original Report Authenticated By: P. Loralie Champagne, M.D.  Medications: Scheduled Meds:   . clindamycin (CLEOCIN) IV  600 mg Intravenous Once  . darbepoetin      . darbepoetin  25 mcg Subcutaneous Q Wed-HD  . heparin  5,000 Units Subcutaneous Q8H  . insulin aspart  0-15 Units Subcutaneous TID WC  . insulin aspart  0-5 Units Subcutaneous QHS  . paricalcitol      . paricalcitol  2 mcg Intravenous 3 times weekly  . vancomycin  1,000 mg Intravenous Once   Continuous Infusions:  PRN Meds:.sodium chloride, sodium chloride, acetaminophen, acetaminophen, feeding supplement (NEPRO CARB STEADY), heparin, heparin, hydrALAZINE, lidocaine, lidocaine-prilocaine, ondansetron (ZOFRAN) IV, ondansetron, pentafluoroprop-tetrafluoroeth  Assessment/Plan:  Principal Problem:  *Diabetic foot ulcer Patient is currently on vancomycin and clindamycin. Orthopedics also plan the impression of the right great toe. Active Problems:   Diabetes mellitus Patient will be continued on SSI  HTN (hypertension) BP is controlled, continue prn hydralazine. Will start Amlodipine 10 mg po daily. Anemia Hb is stable, secondary to CKD. Continue epo as per nephrology.  ESRD (end stage renal disease) on dialysis Hemodialysis as per nephrology. DVT Prophylaxis: Heparin.      LOS: 1 day   Quaneisha Hanisch S 03/02/2011, 5:04 PM

## 2011-03-02 NOTE — Progress Notes (Signed)
Subjective:  Slept poorly.  Denies any pain in right foot. No SOB. Diarrhea last week. Objective Vital signs in last 24 hours: Temp:  [97.5 F (36.4 C)-98.7 F (37.1 C)] 98 F (36.7 C) (11/07 0701) Pulse Rate:  [57-68] 68  (11/07 0701) Resp:  [16-22] 20  (11/07 0701) BP: (155-179)/(74-100) 165/90 mmHg (11/07 0701) SpO2:  [95 %-100 %] 98 % (11/07 0701) Weight:  [97.1 kg (214 lb 1.1 oz)-97.977 kg (216 lb)] 214 lb 1.1 oz (97.1 kg) (11/07 1610) Weight change:  No intake or output data in the 24 hours ending 03/02/11 0741 Physician Exam: General: Resting comfortably looks tired Heart: RRR Lungs: crackles at bases Abdomen: + BS soft NT ND obese Extremities:right great toe necrotic tip; right 1+ lower extremity edema;erythematous shin few blisters; left BKA; no edema Dialysis Access: left upper AVF patent (staff unable to use "arterial collapses" per RN) Right I - J catheter  Lab Results:  [facility] 03/02/11 0610 03/01/11 2228  WBC 6.7 7.2  HGB 10.4* 10.9*  HCT 32.3* 33.2*  PLT 174 183   Renal:  Basename 03/02/11 0610 03/01/11 2228 03/01/11 1318  NA 136 -- 135  K 5.2* -- 4.7  CL 99 -- 97  CO2 21 -- 22  GLUCOSE 111* -- 124*  BUN 53* -- 48*  CREATININE 7.96* 7.52* --  CALCIUM 8.5 -- 9.0  PHOS -- -- --  ALBUMIN -- -- --   Studies/Results: Dg Toe Great Right  03/01/2011  *RADIOLOGY REPORT*  Clinical Data: Open wound.  RIGHT TOE - 2+ VIEW  Comparison: None  Findings: Soft tissue thickening and irregularity noted.  There is destructive bony change involving the distal tuft region of the distal phalanx suggesting osteomyelitis.  Joint spaces are maintained.  IMPRESSION: Plain film findings suspicious for osteomyelitis involving the distal tuft region of the distal phalanx.  Original Report Authenticated By: P. Loralie Champagne, M.D.   I have reviewed scheduled and prn medications.  Assessment/Plan:  1 Diabetic foot ulcer/probable osteo per xray- on Vanco and clinda. Await ortho  eval regarding amputation 2 ESRD: off schedule,  HD this am and continue 3 tx/week. Her last HD was 11/11/1 (Thursday 3 Hypertension: stable - decrease volume. Likely needs a slightly lower EDW at d/c 4. Anemia of ESRD: continue with epo equivalent 5. Metabolic Bone Disease: continue with binders and low phos diet  6. Dispo- d/c to home pending decision on surgical intervention vs conservative Abx coverage  7.  AVF issues - obtain records from [facility].  She states the dialysis center has been using.  I talked with Karen Kitchens, RN at [facility] who thought they weren't using. AVF placed June 2012. Do not see any evidence of being studied. Has good thrill and bruit.Marland KitchenMarland KitchenUsing catheter today.  Sheffield Slider, PA-C [facility] [phone]   Patient's status reviewed and she was seen on dialysis. Will remove sutures from PC.  Need further evaluation of fistula. She prefers Dr. Gilda Crease to evaluate.  Michol Emory C   03/02/2011,7:41 AM  LOS: 1 day

## 2011-03-02 NOTE — Consults (Signed)
WOC consult Note Reason for Consult: req. To eval pt for RLE foot ulcer, however upon my assessment and review of record. Pt has had ortho/surgical consult this am for same.  Dr. Lajoyce Corners is planning surgical intervention for wound. I will not consult further.  Wound type: DM foot ulcer with xray positive for osteomyelitis  Re consult if needed, will not follow at this time. Thanks  Mort Smelser [employer], CWOCN [phone])

## 2011-03-03 ENCOUNTER — Other Ambulatory Visit (HOSPITAL_COMMUNITY): Payer: Self-pay | Admitting: Orthopedic Surgery

## 2011-03-03 LAB — GLUCOSE, CAPILLARY: Glucose-Capillary: 121 mg/dL — ABNORMAL HIGH (ref 70–99)

## 2011-03-03 MED ORDER — PENTAFLUOROPROP-TETRAFLUOROETH EX AERO: 1.0000 "application " | INHALATION_SPRAY | CUTANEOUS | Status: DC | PRN

## 2011-03-03 MED ORDER — DOCUSATE SODIUM 100 MG PO CAPS: 100.0000 mg | ORAL_CAPSULE | Freq: Two times a day (BID) | ORAL | Status: DC | PRN

## 2011-03-03 MED ORDER — CHLORHEXIDINE GLUCONATE 4 % EX LIQD
60.0000 mL | Freq: Once | CUTANEOUS | Status: DC
  Filled 2011-03-03: qty 118

## 2011-03-03 MED ORDER — HEPARIN SODIUM (PORCINE) 1000 UNIT/ML DIALYSIS
1000.0000 [IU] | INTRAMUSCULAR | Status: DC | PRN
  Filled 2011-03-03: qty 1

## 2011-03-03 MED ORDER — LIDOCAINE-PRILOCAINE 2.5-2.5 % EX CREA: 1.0000 "application " | TOPICAL_CREAM | CUTANEOUS | Status: DC | PRN

## 2011-03-03 MED ORDER — LIDOCAINE HCL (PF) 1 % IJ SOLN: 5.0000 mL | INTRAMUSCULAR | Status: DC | PRN

## 2011-03-03 MED ORDER — CLINDAMYCIN PHOSPHATE 600 MG/50ML IV SOLN
600.0000 mg | INTRAVENOUS | Status: DC
  Filled 2011-03-03: qty 50

## 2011-03-03 MED ORDER — SODIUM CHLORIDE 0.9 % IV SOLN: 100.0000 mL | INTRAVENOUS | Status: DC | PRN

## 2011-03-03 MED ORDER — NEPRO/CARBSTEADY PO LIQD
237.0000 mL | ORAL | Status: DC | PRN
  Filled 2011-03-03: qty 237

## 2011-03-03 MED ORDER — BISACODYL 10 MG RE SUPP: 10.0000 mg | Freq: Two times a day (BID) | RECTAL | Status: DC | PRN

## 2011-03-03 MED ORDER — BISACODYL 5 MG PO TBEC
10.0000 mg | DELAYED_RELEASE_TABLET | Freq: Every day | ORAL | Status: DC | PRN
  Administered 2011-03-03: 10 mg via ORAL
  Filled 2011-03-03: qty 2

## 2011-03-03 NOTE — Progress Notes (Signed)
03/03/2011 [name] [phone]     50yo female patient admitted on 03/01/11 with ulceration to rt great toe. Prior to admission, patient lived at home with family support and was independent with ADLs.  Plan is for amputation of rt great toe. Anticipated discharge to home with self care. Focus note is in shadow chart. CM will continue to monitor.

## 2011-03-03 NOTE — Progress Notes (Signed)
  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 Will plan for amputation of the right great toe Friday morning. Postoperatively patient may be partial weightbearing on the right lower extremity. Plan for 4 weeks of by mouth antibiotics at discharge. 

## 2011-03-03 NOTE — Progress Notes (Signed)
Subjective: Patient seen and examined, Dr Lajoyce Corners has seen the patient and is planning  amputation pf right great toe in am on 03/04/11  Objective: Vital signs in last 24 hours: Temp:  [97.3 F (36.3 C)-98.6 F (37 C)] 97.3 F (36.3 C) (11/08 1400) Pulse Rate:  [60-68] 64  (11/08 1400) Resp:  [16-18] 18  (11/08 1400) BP: (122-144)/(69-78) 128/69 mmHg (11/08 1400) SpO2:  [96 %-98 %] 96 % (11/08 1400) Weight change: -3.877 kg (-8 lb 8.8 oz) Last BM Date: 02/26/11 (patient has no discomfort)  Intake/Output from previous day: 11/07 0701 - 11/08 0700 In: 390 [P.O.:360; I.V.:30] Out: 2875  Total I/O In: 480 [P.O.:480] Out: -    Physical Exam: General: Alert, awake, oriented x3, in no acute distress. HEENT: No bruits, no goiter. Heart: Regular rate and rhythm, without murmurs, rubs, gallops. Lungs: Clear to auscultation bilaterally. Abdomen: Soft, nontender, nondistended, positive bowel sounds. Extremities: Trace edema Neuro: Grossly intact, nonfocal.    Lab Results: Basic Metabolic Panel:  Basename 03/02/11 0610 03/01/11 2228 03/01/11 1318  NA 136 -- 135  K 5.2* -- 4.7  CL 99 -- 97  CO2 21 -- 22  GLUCOSE 111* -- 124*  BUN 53* -- 48*  CREATININE 7.96* 7.52* --  CALCIUM 8.5 -- 9.0  MG -- -- --  PHOS -- -- --   CBC:  Basename 03/02/11 0610 03/01/11 2228 03/01/11 1318  WBC 6.7 7.2 --  NEUTROABS -- -- 6.2  HGB 10.4* 10.9* --  HCT 32.3* 33.2* --  MCV 86.8 86.7 --  PLT 174 183 --    Recent Results (from the past 240 hour([name]))  CULTURE, BLOOD (ROUTINE X 2)     Status: Normal (Preliminary result)   Collection Time   03/01/11  5:55 PM      Component Value Range Status Comment   Specimen Description BLOOD ARM RIGHT   Final    Special Requests BOTTLES DRAWN AEROBIC ONLY 3CC   Final    Setup Time [id number]   Final    Culture     Final    Value:        BLOOD CULTURE RECEIVED NO GROWTH TO DATE CULTURE WILL BE HELD FOR 5 DAYS BEFORE ISSUING A FINAL NEGATIVE REPORT   Report Status PENDING   Incomplete   WOUND CULTURE     Status: Normal (Preliminary result)   Collection Time   03/01/11  7:01 PM      Component Value Range Status Comment   Specimen Description WOUND TOE   Final    Special Requests NONE   Final    Gram Stain     Final    Value: ABUNDANT WBC PRESENT, PREDOMINANTLY PMN     RARE SQUAMOUS EPITHELIAL CELLS PRESENT     ABUNDANT GRAM POSITIVE COCCI IN PAIRS     IN CLUSTERS RARE GRAM NEGATIVE RODS   Culture     Final    Value: ABUNDANT STAPHYLOCOCCUS AUREUS     Note: RIFAMPIN AND GENTAMICIN SHOULD NOT BE USED AS SINGLE DRUGS FOR TREATMENT OF STAPH INFECTIONS.   Report Status PENDING   Incomplete     Medications: Scheduled Meds:    . amLODipine  10 mg Oral Daily  . clindamycin (CLEOCIN) IV  600 mg Intravenous Once  . darbepoetin  25 mcg Subcutaneous Q Wed-HD  . heparin  5,000 Units Subcutaneous Q8H  . insulin aspart  0-15 Units Subcutaneous TID WC  . insulin aspart  0-5 Units Subcutaneous QHS  .  paricalcitol  2 mcg Intravenous 3 times weekly    Assessment/Plan:  Principal Problem:  *Diabetic foot ulcer Patient is currently on vancomycin and clindamycin. Orthopedics plan to do amputation of  right great toe in am.  Diabetes mellitus Patient will be continued on SSI  HTN (hypertension) BP is controlled, continue prn hydralazine. Continue po Amlodipine. Anemia Hb is stable, secondary to CKD. Continue epo as per nephrology. ESRD (end stage renal disease) on dialysis Hemodialysis as per nephrology. DVT Prophylaxis: Heparin.      LOS: 2 days   [name] 03/03/2011, 2:14 PM

## 2011-03-03 NOTE — ED Provider Notes (Signed)
Medical screening examination/treatment/procedure(s) were conducted as a shared visit with non-physician practitioner(s) and myself.  I personally evaluated the patient during the encounter  Laray Anger, DO 03/03/11 [phone]

## 2011-03-03 NOTE — Progress Notes (Signed)
Subjective: Interval History: has complaints of not wanting dialysis today to get back on schedule.  Objective: Vital signs in last 24 hours: Temp:  [97 F (36.1 C)-98.6 F (37 C)] 98.2 F (36.8 C) (11/08 0530) Pulse Rate:  [60-90] 60  (11/08 0530) Resp:  [16-18] 16  (11/08 0530) BP: (122-190)/(70-97) 144/76 mmHg (11/08 0530) SpO2:  [94 %-98 %] 98 % (11/08 0530) Weight:  [94.1 kg (207 lb 7.3 oz)] 207 lb 7.3 oz (94.1 kg) (11/07 1154) Weight change: -3.877 kg (-8 lb 8.8 oz)  Intake/Output from previous day: 11/07 0701 - 11/08 0700 In: 390 [P.O.:360; I.V.:30] Out: 2875  Intake/Output this shift:    General appearance: alert and cooperative Resp: clear to auscultation bilaterally GI: soft, non-tender; bowel sounds normal; no masses,  no organomegaly Extremities: edema 2+  Lab Results:  [facility] 03/02/11 0610 03/01/11 2228  WBC 6.7 7.2  HGB 10.4* 10.9*  HCT 32.3* 33.2*  PLT 174 183   BMET:  Basename 03/02/11 0610 03/01/11 2228 03/01/11 1318  NA 136 -- 135  K 5.2* -- 4.7  CL 99 -- 97  CO2 21 -- 22  GLUCOSE 111* -- 124*  BUN 53* -- 48*  CREATININE 7.96* 7.52* --  CALCIUM 8.5 -- 9.0   No results found for this basename: PTH:2 in the last 72 hours Iron Studies: No results found for this basename: IRON,TIBC,TRANSFERRIN,FERRITIN in the last 72 hours  Studies/Results: Dg Toe Great Right  03/01/2011  *RADIOLOGY REPORT*  Clinical Data: Open wound.  RIGHT TOE - 2+ VIEW  Comparison: None  Findings: Soft tissue thickening and irregularity noted.  There is destructive bony change involving the distal tuft region of the distal phalanx suggesting osteomyelitis.  Joint spaces are maintained.  IMPRESSION: Plain film findings suspicious for osteomyelitis involving the distal tuft region of the distal phalanx.  Original Report Authenticated By: P. Loralie Champagne, M.D.    I have reviewed the patient's current medications.  Assessment/Plan:  1 Diabetic foot  osteo per xray- on Vanco  and clinda. For amputation Friday. 2 ESRD: off schedule, HD Friday post op (usual TTS) 3 AVF issues - . To f/u with Dr. Juleen Starr in [facility] for now.    LOS: 2 days   Sesar Madewell C 03/03/2011,9:37 AM

## 2011-03-04 ENCOUNTER — Encounter (HOSPITAL_COMMUNITY)
Admission: EM | Disposition: A | Discharge status: Home / Self Care | Payer: Self-pay | Source: Home / Self Care | Attending: Family Medicine

## 2011-03-04 ENCOUNTER — Encounter (HOSPITAL_COMMUNITY): Payer: Self-pay | Admitting: Anesthesiology

## 2011-03-04 ENCOUNTER — Other Ambulatory Visit: Payer: Self-pay | Admitting: Orthopedic Surgery

## 2011-03-04 ENCOUNTER — Inpatient Hospital Stay (HOSPITAL_COMMUNITY): Payer: Medicare Other | Admitting: Anesthesiology

## 2011-03-04 HISTORY — PX: AMPUTATION: SHX166

## 2011-03-04 LAB — POCT I-STAT 4, (NA,K, GLUC, HGB,HCT)
HCT: 34 % — ABNORMAL LOW (ref 36.0–46.0)
Hemoglobin: 11.6 g/dL — ABNORMAL LOW (ref 12.0–15.0)
Potassium: 4.6 mEq/L (ref 3.5–5.1)
Sodium: 133 mEq/L — ABNORMAL LOW (ref 135–145)

## 2011-03-04 LAB — WOUND CULTURE

## 2011-03-04 LAB — GLUCOSE, CAPILLARY: Glucose-Capillary: 191 mg/dL — ABNORMAL HIGH (ref 70–99)

## 2011-03-04 SURGERY — AMPUTATION DIGIT
Anesthesia: Monitor Anesthesia Care | Site: Foot | Laterality: Right | Wound class: Dirty or Infected

## 2011-03-04 MED ORDER — CALCIUM ACETATE (PHOS BINDER) 667 MG/5ML PO SOLN
1334.0000 mg | Freq: Three times a day (TID) | ORAL | Status: DC
  Administered 2011-03-04 – 2011-03-05 (×3): 1334 mg via ORAL
  Filled 2011-03-04 (×7): qty 10

## 2011-03-04 MED ORDER — PROPOFOL 10 MG/ML IV EMUL
INTRAVENOUS | Status: DC | PRN
  Administered 2011-03-04: 150 mg via INTRAVENOUS

## 2011-03-04 MED ORDER — CLINDAMYCIN PHOSPHATE 600 MG/50ML IV SOLN
INTRAVENOUS | Status: DC | PRN
  Administered 2011-03-04: 600 mg via INTRAVENOUS

## 2011-03-04 MED ORDER — COUMADIN BOOK
Freq: Once | Status: DC
  Filled 2011-03-04: qty 1

## 2011-03-04 MED ORDER — SODIUM CHLORIDE 0.9 % IV SOLN
INTRAVENOUS | Status: DC | PRN
  Administered 2011-03-04: 08:00:00 via INTRAVENOUS

## 2011-03-04 MED ORDER — HYDROCODONE-ACETAMINOPHEN 5-325 MG PO TABS: 1.0000 | ORAL_TABLET | ORAL | Status: DC | PRN

## 2011-03-04 MED ORDER — LIDOCAINE HCL (CARDIAC) 20 MG/ML IV SOLN
INTRAVENOUS | Status: DC | PRN
  Administered 2011-03-04: 80 mg via INTRAVENOUS

## 2011-03-04 MED ORDER — MIDAZOLAM HCL 5 MG/5ML IJ SOLN
INTRAMUSCULAR | Status: DC | PRN
  Administered 2011-03-04: 1 mg via INTRAVENOUS

## 2011-03-04 MED ORDER — METOCLOPRAMIDE HCL 5 MG/ML IJ SOLN: 10.0000 mg | Freq: Once | INTRAMUSCULAR | Status: DC | PRN

## 2011-03-04 MED ORDER — VANCOMYCIN HCL 1000 MG IV SOLR
1500.0000 mg | INTRAVENOUS | Status: AC
  Administered 2011-03-04: 1500 mg via INTRAVENOUS
  Filled 2011-03-04 (×2): qty 1500

## 2011-03-04 MED ORDER — OXYCODONE-ACETAMINOPHEN 5-325 MG PO TABS
1.0000 | ORAL_TABLET | ORAL | Status: DC | PRN
  Administered 2011-03-04: 1 via ORAL
  Filled 2011-03-04: qty 1

## 2011-03-04 MED ORDER — ONDANSETRON HCL 4 MG/2ML IJ SOLN
4.0000 mg | Freq: Four times a day (QID) | INTRAMUSCULAR | Status: DC | PRN
  Administered 2011-03-05: 4 mg via INTRAVENOUS

## 2011-03-04 MED ORDER — METOCLOPRAMIDE HCL 5 MG/ML IJ SOLN
5.0000 mg | Freq: Three times a day (TID) | INTRAMUSCULAR | Status: DC | PRN
  Filled 2011-03-04: qty 2

## 2011-03-04 MED ORDER — HYDROMORPHONE HCL PF 1 MG/ML IJ SOLN: 0.5000 mg | INTRAMUSCULAR | Status: DC | PRN

## 2011-03-04 MED ORDER — METOCLOPRAMIDE HCL 10 MG PO TABS: 5.0000 mg | ORAL_TABLET | Freq: Three times a day (TID) | ORAL | Status: DC | PRN

## 2011-03-04 MED ORDER — CLINDAMYCIN PHOSPHATE 600 MG/50ML IV SOLN
600.0000 mg | Freq: Three times a day (TID) | INTRAVENOUS | Status: DC
  Administered 2011-03-04 – 2011-03-05 (×3): 600 mg via INTRAVENOUS
  Filled 2011-03-04 (×6): qty 50

## 2011-03-04 MED ORDER — METOCLOPRAMIDE HCL 5 MG/ML IJ SOLN: 5.0000 mg | Freq: Three times a day (TID) | INTRAMUSCULAR | Status: DC | PRN

## 2011-03-04 MED ORDER — BISOPROLOL-HYDROCHLOROTHIAZIDE 5-6.25 MG PO TABS
1.0000 | ORAL_TABLET | Freq: Every day | ORAL | Status: DC
  Administered 2011-03-04 – 2011-03-05 (×2): 1 via ORAL
  Filled 2011-03-04 (×2): qty 1

## 2011-03-04 MED ORDER — FENTANYL CITRATE 0.05 MG/ML IJ SOLN: 25.0000 ug | INTRAMUSCULAR | Status: DC | PRN

## 2011-03-04 MED ORDER — VANCOMYCIN HCL IN DEXTROSE 1-5 GM/200ML-% IV SOLN
1000.0000 mg | INTRAVENOUS | Status: DC | PRN
  Administered 2011-03-05: 1000 mg via INTRAVENOUS
  Filled 2011-03-04 (×2): qty 200

## 2011-03-04 MED ORDER — FENTANYL CITRATE 0.05 MG/ML IJ SOLN
INTRAMUSCULAR | Status: DC | PRN
Start: 2011-03-04 — End: 2011-03-04
  Administered 2011-03-04: 150 ug via INTRAVENOUS

## 2011-03-04 MED ORDER — ONDANSETRON HCL 4 MG PO TABS: 4.0000 mg | ORAL_TABLET | Freq: Four times a day (QID) | ORAL | Status: DC | PRN

## 2011-03-04 MED ORDER — WARFARIN VIDEO
Freq: Once | Status: DC
  Filled 2011-03-04: qty 1

## 2011-03-04 MED ORDER — INSULIN ASPART 100 UNIT/ML ~~LOC~~ SOLN
8.0000 [IU] | Freq: Every day | SUBCUTANEOUS | Status: DC
  Filled 2011-03-04: qty 3

## 2011-03-04 MED ORDER — SENNOSIDES-DOCUSATE SODIUM 8.6-50 MG PO TABS: 1.0000 | ORAL_TABLET | Freq: Every evening | ORAL | Status: DC | PRN

## 2011-03-04 MED ORDER — EPHEDRINE SULFATE 50 MG/ML IJ SOLN
INTRAMUSCULAR | Status: DC | PRN
  Administered 2011-03-04: 10 mg via INTRAVENOUS

## 2011-03-04 MED ORDER — MEPERIDINE HCL 25 MG/ML IJ SOLN: 6.2500 mg | INTRAMUSCULAR | Status: DC | PRN

## 2011-03-04 MED ORDER — ONDANSETRON HCL 4 MG/2ML IJ SOLN: 4.0000 mg | Freq: Four times a day (QID) | INTRAMUSCULAR | Status: DC | PRN

## 2011-03-04 MED ORDER — WARFARIN SODIUM 5 MG PO TABS
5.0000 mg | ORAL_TABLET | Freq: Once | ORAL | Status: AC
  Administered 2011-03-04: 5 mg via ORAL
  Filled 2011-03-04: qty 1

## 2011-03-04 MED ORDER — SODIUM CHLORIDE 0.9 % IR SOLN
Status: DC | PRN
  Administered 2011-03-04: 1000 mL

## 2011-03-04 MED ORDER — INSULIN GLARGINE 100 UNIT/ML ~~LOC~~ SOLN
12.0000 [IU] | Freq: Every evening | SUBCUTANEOUS | Status: DC
  Administered 2011-03-04: 12 [IU] via SUBCUTANEOUS
  Filled 2011-03-04: qty 3

## 2011-03-04 SURGICAL SUPPLY — 46 items
BANDAGE GAUZE 4  KLING STR (GAUZE/BANDAGES/DRESSINGS) IMPLANT
BLADE AVERAGE 25X9 (BLADE) IMPLANT
BLADE MINI RND TIP GREEN BEAV (BLADE) IMPLANT
BNDG COHESIVE 1X5 TAN STRL LF (GAUZE/BANDAGES/DRESSINGS) IMPLANT
BNDG COHESIVE 6X5 TAN STRL LF (GAUZE/BANDAGES/DRESSINGS) ×2 IMPLANT
BNDG ESMARK 4X9 LF (GAUZE/BANDAGES/DRESSINGS) ×2 IMPLANT
BNDG GAUZE STRTCH 6 (GAUZE/BANDAGES/DRESSINGS) IMPLANT
CLOTH BEACON ORANGE TIMEOUT ST (SAFETY) ×2 IMPLANT
CORDS BIPOLAR (ELECTRODE) IMPLANT
COVER SURGICAL LIGHT HANDLE (MISCELLANEOUS) ×2 IMPLANT
CUFF TOURNIQUET SINGLE 18IN (TOURNIQUET CUFF) IMPLANT
CUFF TOURNIQUET SINGLE 24IN (TOURNIQUET CUFF) IMPLANT
CUFF TOURNIQUET SINGLE 34IN LL (TOURNIQUET CUFF) IMPLANT
CUFF TOURNIQUET SINGLE 44IN (TOURNIQUET CUFF) IMPLANT
DRAPE U-SHAPE 47X51 STRL (DRAPES) ×2 IMPLANT
DRSG ADAPTIC 3X8 NADH LF (GAUZE/BANDAGES/DRESSINGS) ×2 IMPLANT
DURAPREP 26ML APPLICATOR (WOUND CARE) ×2 IMPLANT
ELECT REM PT RETURN 9FT ADLT (ELECTROSURGICAL) ×2
ELECTRODE REM PT RTRN 9FT ADLT (ELECTROSURGICAL) ×1 IMPLANT
GAUZE SPONGE 2X2 8PLY STRL LF (GAUZE/BANDAGES/DRESSINGS) IMPLANT
GLOVE BIOGEL PI IND STRL 9 (GLOVE) ×1 IMPLANT
GLOVE BIOGEL PI INDICATOR 9 (GLOVE) ×1
GLOVE SURG ORTHO 9.0 STRL STRW (GLOVE) ×2 IMPLANT
GOWN PREVENTION PLUS XLARGE (GOWN DISPOSABLE) ×2 IMPLANT
GOWN SRG XL XLNG 56XLVL 4 (GOWN DISPOSABLE) ×1 IMPLANT
GOWN STRL NON-REIN XL XLG LVL4 (GOWN DISPOSABLE) ×1
KIT BASIN OR (CUSTOM PROCEDURE TRAY) ×2 IMPLANT
KIT ROOM TURNOVER OR (KITS) ×2 IMPLANT
MANIFOLD NEPTUNE II (INSTRUMENTS) ×2 IMPLANT
NEEDLE HYPO 25GX1X1/2 BEV (NEEDLE) IMPLANT
NS IRRIG 1000ML POUR BTL (IV SOLUTION) ×2 IMPLANT
PACK ORTHO EXTREMITY (CUSTOM PROCEDURE TRAY) ×2 IMPLANT
PAD ARMBOARD 7.5X6 YLW CONV (MISCELLANEOUS) ×2 IMPLANT
PAD CAST 4YDX4 CTTN HI CHSV (CAST SUPPLIES) ×1 IMPLANT
PADDING CAST COTTON 4X4 STRL (CAST SUPPLIES) ×1
SPECIMEN JAR SMALL (MISCELLANEOUS) ×2 IMPLANT
SPONGE GAUZE 2X2 STER 10/PKG (GAUZE/BANDAGES/DRESSINGS)
SPONGE GAUZE 4X4 12PLY (GAUZE/BANDAGES/DRESSINGS) IMPLANT
SUCTION FRAZIER TIP 10 FR DISP (SUCTIONS) ×2 IMPLANT
SUT ETHILON 2 0 PSLX (SUTURE) ×2 IMPLANT
SUT VIC AB 2-0 FS1 27 (SUTURE) IMPLANT
SYR CONTROL 10ML LL (SYRINGE) IMPLANT
TOWEL OR 17X24 6PK STRL BLUE (TOWEL DISPOSABLE) ×2 IMPLANT
TOWEL OR 17X26 10 PK STRL BLUE (TOWEL DISPOSABLE) ×2 IMPLANT
TUBE CONNECTING 12X1/4 (SUCTIONS) ×2 IMPLANT
WATER STERILE IRR 1000ML POUR (IV SOLUTION) ×2 IMPLANT

## 2011-03-04 NOTE — Anesthesia Postprocedure Evaluation (Signed)
  Anesthesia Post-op Note  Patient: [name]  Procedure(s) Performed:  AMPUTATION DIGIT - RT GREAT TOE AMP  Patient Location: PACU  Anesthesia Type: General  Level of Consciousness: awake, alert  and oriented  Airway and Oxygen Therapy: Patient Spontanous Breathing  Post-op Pain: none  Post-op Assessment: Post-op Vital signs reviewed, Patient's Cardiovascular Status Stable, Respiratory Function Stable, Patent Airway, No signs of Nausea or vomiting and Pain level controlled  Post-op Vital Signs: Reviewed and stable  Complications: No apparent anesthesia complications

## 2011-03-04 NOTE — Progress Notes (Signed)
Patient may be discharged to home this weekend if she is independent and stable with her ambulation. I will followup with her one week postoperatively. Patient does not need long-term antibiotics.

## 2011-03-04 NOTE — Preoperative (Signed)
Beta Blockers   Reason not to administer Beta Blockers:Not Applicable 

## 2011-03-04 NOTE — Transfer of Care (Signed)
Immediate Anesthesia Transfer of Care Note  Patient: [name]  Procedure(s) Performed:  AMPUTATION DIGIT - RT GREAT TOE AMP  Patient Location: PACU  Anesthesia Type: General  Level of Consciousness: awake, alert  and oriented  Airway & Oxygen Therapy: Patient Spontanous Breathing and Patient connected to face mask oxygen  Post-op Assessment: Report given to PACU RN  Post vital signs: Reviewed and stable  Complications: No apparent anesthesia complications

## 2011-03-04 NOTE — Progress Notes (Signed)
Subjective: Patient seen and examined, s/p amputation of right big toe. Objective: Vital signs in last 24 hours: Temp:  [97.1 F (36.2 C)-98 F (36.7 C)] 97.1 F (36.2 C) (11/09 1430) Pulse Rate:  [60-71] 64  (11/09 1430) Resp:  [12-25] 18  (11/09 1430) BP: (137-150)/(66-86) 150/66 mmHg (11/09 1430) SpO2:  [92 %-98 %] 97 % (11/09 1430) Weight change:  Last BM Date: 03/03/11 (states she had small hard bm,laxatives given)  Intake/Output from previous day: 11/08 0701 - 11/09 0700 In: 870 [P.O.:870] Out: -  Total I/O In: 680 [P.O.:480; I.V.:200] Out: -    Physical Exam: General: Alert, awake, oriented x3, in no acute distress. HEENT: No bruits, no goiter. Heart: Regular rate and rhythm, without murmurs, rubs, gallops. Lungs: Clear to auscultation bilaterally. Abdomen: Soft, nontender, nondistended, positive bowel sounds. Extremities: Trace edema Neuro: Grossly intact, nonfocal.    Lab Results: Basic Metabolic Panel:  Basename 03/04/11 0816 03/02/11 0610 03/01/11 2228  NA 133* 136 --  K 4.6 5.2* --  CL -- 99 --  CO2 -- 21 --  GLUCOSE 134* 111* --  BUN -- 53* --  CREATININE -- 7.96* 7.52*  CALCIUM -- 8.5 --  MG -- -- --  PHOS -- -- --   CBC:  Basename 03/04/11 0816 03/02/11 0610 03/01/11 2228  WBC -- 6.7 7.2  NEUTROABS -- -- --  HGB 11.6* 10.4* --  HCT 34.0* 32.3* --  MCV -- 86.8 86.7  PLT -- 174 183    Recent Results (from the past 240 hour(s))  CULTURE, BLOOD (ROUTINE X 2)     Status: Normal (Preliminary result)   Collection Time   03/01/11  5:55 PM      Component Value Range Status Comment   Specimen Description BLOOD ARM RIGHT   Final    Special Requests BOTTLES DRAWN AEROBIC ONLY 3CC   Final    Setup Time [id number]   Final    Culture     Final    Value:        BLOOD CULTURE RECEIVED NO GROWTH TO DATE CULTURE WILL BE HELD FOR 5 DAYS BEFORE ISSUING A FINAL NEGATIVE REPORT   Report Status PENDING   Incomplete   CULTURE, BLOOD (ROUTINE X 2)      Status: Normal (Preliminary result)   Collection Time   03/01/11  6:05 PM      Component Value Range Status Comment   Specimen Description BLOOD ARM RIGHT   Final    Special Requests BOTTLES DRAWN AEROBIC ONLY 3CC LOWER   Final    Setup Time [id number]   Final    Culture     Final    Value:        BLOOD CULTURE RECEIVED NO GROWTH TO DATE CULTURE WILL BE HELD FOR 5 DAYS BEFORE ISSUING A FINAL NEGATIVE REPORT   Report Status PENDING   Incomplete   WOUND CULTURE     Status: Normal   Collection Time   03/01/11  7:01 PM      Component Value Range Status Comment   Specimen Description WOUND TOE   Final    Special Requests NONE   Final    Gram Stain     Final    Value: ABUNDANT WBC PRESENT, PREDOMINANTLY PMN     RARE SQUAMOUS EPITHELIAL CELLS PRESENT     ABUNDANT GRAM POSITIVE COCCI IN PAIRS     IN CLUSTERS RARE GRAM NEGATIVE RODS   Culture     Final  Value: ABUNDANT STAPHYLOCOCCUS AUREUS     Note: RIFAMPIN AND GENTAMICIN SHOULD NOT BE USED AS SINGLE DRUGS FOR TREATMENT OF STAPH INFECTIONS. This organism DOES NOT demonstrate inducible Clindamycin resistance in vitro.   Report Status 03/04/2011 FINAL   Final    Organism ID, Bacteria STAPHYLOCOCCUS AUREUS   Final     Medications: Scheduled Meds:    . amLODipine  10 mg Oral Daily  . bisoprolol-hydrochlorothiazide  1 tablet Oral Daily  . calcium acetate (Phos Binder)  1,334 mg Oral TID WC  . clindamycin (CLEOCIN) IV  600 mg Intravenous Once  . clindamycin (CLEOCIN) IV  600 mg Intravenous Q8H  . coumadin book   Does not apply Once  . darbepoetin  25 mcg Subcutaneous Q Wed-HD  . heparin  5,000 Units Subcutaneous Q8H  . insulin aspart  0-15 Units Subcutaneous TID WC  . insulin aspart  0-5 Units Subcutaneous QHS  . insulin aspart  8 Units Subcutaneous Daily  . insulin glargine  12 Units Subcutaneous QPM  . paricalcitol  2 mcg Intravenous 3 times weekly  . vancomycin  1,500 mg Intravenous NOW  . warfarin  5 mg Oral ONCE-1800  .  warfarin   Does not apply Once  . DISCONTD: chlorhexidine  60 mL Topical Once  . DISCONTD: clindamycin (CLEOCIN) IV  600 mg Intravenous 60 min Pre-Op    Assessment/Plan:  Principal Problem:  *Diabetic foot ulcer Patient is currently on vancomycin and clindamycin. Will d/c the abx in am.  Diabetes mellitus Patient will be continued on SSI  HTN (hypertension) BP is controlled, continue prn hydralazine. Continue po Amlodipine. Anemia Hb is stable, secondary to CKD. Continue epo as per nephrology. ESRD (end stage renal disease) on dialysis Hemodialysis as per nephrology. DVT Prophylaxis: Heparin.      LOS: 3 days   [name] S 03/04/2011, 6:24 PM

## 2011-03-04 NOTE — Op Note (Signed)
03/01/2011 - 03/04/2011  9:23 AM  PATIENT:  [name]  49 y.o. female  PRE-OPERATIVE DIAGNOSIS:  osteomyelitis right great toe  POST-OPERATIVE DIAGNOSIS:  osteomyelitis right great toe  PROCEDURE:  Procedure(s): AMPUTATION DIGIT  SURGEON:  Surgeon(s): [name], MD      ANESTHESIA:   general  EBL:            SPECIMEN:  Source of Specimen:  Right great toe  DISPOSITION OF SPECIMEN:  PATHOLOGY    TOURNIQUET:  * No tourniquets in log *  DICTATION: .Dragon Dictation patient was brought to OR room 10 and underwent a general anesthetic. After adequate levels and anesthesia were obtained patient's right lower extremity was prepped using DuraPrep and draped into a sterile field. The toe was amputated on the right foot foot through the MTP joint. Hemostasis was obtained. The wound is irrigated with normal saline. The wound was closed using 2-0 nylon. Patient also had venous stasis of brawny skin color changes and edema in the right lower extremity. The wound was covered with a sterile dressing the right leg was wrapped in an [employer].  PLAN OF CARE: Admit to inpatient   PATIENT DISPOSITION:  PACU - hemodynamically stable.   Delay start of Pharmacological VTE agent (>24hrs) due to surgical blood loss or risk of bleeding:  no

## 2011-03-04 NOTE — Progress Notes (Signed)
S:feels well. No new CO O:BP 150/66  Pulse 64  Temp(Src) 97.1 F (36.2 C) (Oral)  Resp 18  Ht 5\' 3"  (1.6 m)  Wt 94.1 kg (207 lb 7.3 oz)  BMI 36.75 kg/m2  SpO2 97%  Intake/Output Summary (Last 24 hours) at 03/04/11 1441 Last data filed at 03/04/11 1300  Gross per 24 hour  Intake    830 ml  Output      0 ml  Net    830 ml   Weight change:  Gen NAD CVS:RRR Resp:Clear GEX:BMWU +bs Ext: Rt foot wrapped. Lt BKA NEURO:Ox3 CNI       . amLODipine  10 mg Oral Daily  . bisoprolol-hydrochlorothiazide  1 tablet Oral Daily  . calcium acetate (Phos Binder)  1,334 mg Oral TID WC  . clindamycin (CLEOCIN) IV  600 mg Intravenous Once  . clindamycin (CLEOCIN) IV  600 mg Intravenous Q8H  . coumadin book   Does not apply Once  . darbepoetin  25 mcg Subcutaneous Q Wed-HD  . heparin  5,000 Units Subcutaneous Q8H  . insulin aspart  0-15 Units Subcutaneous TID WC  . insulin aspart  0-5 Units Subcutaneous QHS  . insulin aspart  8 Units Subcutaneous Daily  . insulin glargine  12 Units Subcutaneous QPM  . paricalcitol  2 mcg Intravenous 3 times weekly  . vancomycin  1,500 mg Intravenous NOW  . warfarin  5 mg Oral ONCE-1800  . warfarin   Does not apply Once  . DISCONTD: chlorhexidine  60 mL Topical Once  . DISCONTD: clindamycin (CLEOCIN) IV  600 mg Intravenous 60 min Pre-Op   No results found. BMET    Component Value Date/Time   NA 133* 03/04/2011 0816   K 4.6 03/04/2011 0816   CL 99 03/02/2011 0610   CO2 21 03/02/2011 0610   GLUCOSE 134* 03/04/2011 0816   BUN 53* 03/02/2011 0610   CREATININE 7.96* 03/02/2011 0610   CALCIUM 8.5 03/02/2011 0610   CALCIUM 8.0* 03/02/2009 1350   GFRNONAA 5* 03/02/2011 0610   GFRAA 6* 03/02/2011 0610   CBC    Component Value Date/Time   WBC 6.7 03/02/2011 0610   RBC 3.72* 03/02/2011 0610   HGB 11.6* 03/04/2011 0816   HCT 34.0* 03/04/2011 0816   PLT 174 03/02/2011 0610   MCV 86.8 03/02/2011 0610   MCH 28.0 03/02/2011 0610   MCHC 32.2 03/02/2011 0610   RDW 15.4  03/02/2011 0610   LYMPHSABS 1.6 03/01/2011 1318   MONOABS 0.4 03/01/2011 1318   EOSABS 0.2 03/01/2011 1318   BASOSABS 0.0 03/01/2011 1318     Assessment:  1.Staph  Osteo Rt great toe SP Amputatio 2. DM 3. ESRD 4. Anemia on Aranesp 5. 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

## 2011-03-04 NOTE — Progress Notes (Addendum)
ANTIBIOTIC CONSULT NOTE - INITIAL  Pharmacy Consult for Vancomycin and Warfarin Indication: osteomyelitis, s/p amputation of R great toe, DVT prophylaxis  Allergies  Allergen Reactions  . Codeine   . Levaquin   . Penicillins     Patient Measurements: Height: 5\' 3"  (160 cm) Weight: 207 lb 7.3 oz (94.1 kg) IBW/kg (Calculated) : 52.4  Adjusted Body Weight: n/a  Vital Signs: Temp: 97.3 F (36.3 C) (11/09 1050) BP: 147/72 mmHg (11/09 1050) Pulse Rate: 64  (11/09 1050) Intake/Output from previous day: 11/08 0701 - 11/09 0700 In: 870 [P.O.:870] Out: -  Intake/Output from this shift: Total I/O In: 200 [I.V.:200] Out: -   Labs:  Basename 03/04/11 0816 03/02/11 0610 03/01/11 2228  WBC -- 6.7 7.2  HGB 11.6* 10.4* 10.9*  PLT -- 174 183  LABCREA -- -- --  CREATININE -- 7.96* 7.52*   Estimated Creatinine Clearance: 9.4 ml/min (by C-G formula based on Cr of 7.96).   Microbiology: Recent Results (from the past 720 hour(s))  CULTURE, BLOOD (ROUTINE X 2)     Status: Normal (Preliminary result)   Collection Time   03/01/11  5:55 PM      Component Value Range Status Comment   Specimen Description BLOOD ARM RIGHT   Final    Special Requests BOTTLES DRAWN AEROBIC ONLY 3CC   Final    Setup Time [id number]   Final    Culture     Final    Value:        BLOOD CULTURE RECEIVED NO GROWTH TO DATE CULTURE WILL BE HELD FOR 5 DAYS BEFORE ISSUING A FINAL NEGATIVE REPORT   Report Status PENDING   Incomplete   CULTURE, BLOOD (ROUTINE X 2)     Status: Normal (Preliminary result)   Collection Time   03/01/11  6:05 PM      Component Value Range Status Comment   Specimen Description BLOOD ARM RIGHT   Final    Special Requests BOTTLES DRAWN AEROBIC ONLY 3CC LOWER   Final    Setup Time [id number]   Final    Culture     Final    Value:        BLOOD CULTURE RECEIVED NO GROWTH TO DATE CULTURE WILL BE HELD FOR 5 DAYS BEFORE ISSUING A FINAL NEGATIVE REPORT   Report Status PENDING   Incomplete     WOUND CULTURE     Status: Normal   Collection Time   03/01/11  7:01 PM      Component Value Range Status Comment   Specimen Description WOUND TOE   Final    Special Requests NONE   Final    Gram Stain     Final    Value: ABUNDANT WBC PRESENT, PREDOMINANTLY PMN     RARE SQUAMOUS EPITHELIAL CELLS PRESENT     ABUNDANT GRAM POSITIVE COCCI IN PAIRS     IN CLUSTERS RARE GRAM NEGATIVE RODS   Culture     Final    Value: ABUNDANT STAPHYLOCOCCUS AUREUS     Note: RIFAMPIN AND GENTAMICIN SHOULD NOT BE USED AS SINGLE DRUGS FOR TREATMENT OF STAPH INFECTIONS. This organism DOES NOT demonstrate inducible Clindamycin resistance in vitro.   Report Status 03/04/2011 FINAL   Final    Organism ID, Bacteria STAPHYLOCOCCUS AUREUS   Final     Medical History: Past Medical History  Diagnosis Date  . Hypertension   . Anemia   . Diabetes mellitus   . Renal failure     Medications:  Scheduled:    . amLODipine  10 mg Oral Daily  . bisoprolol-hydrochlorothiazide  1 tablet Oral Daily  . calcium acetate (Phos Binder)  1,334 mg Oral TID WC  . clindamycin (CLEOCIN) IV  600 mg Intravenous Once  . clindamycin (CLEOCIN) IV  600 mg Intravenous Q8H  . darbepoetin  25 mcg Subcutaneous Q Wed-HD  . heparin  5,000 Units Subcutaneous Q8H  . insulin aspart  0-15 Units Subcutaneous TID WC  . insulin aspart  0-5 Units Subcutaneous QHS  . insulin aspart  8 Units Subcutaneous Daily  . insulin glargine  12 Units Subcutaneous QPM  . paricalcitol  2 mcg Intravenous 3 times weekly  . DISCONTD: chlorhexidine  60 mL Topical Once  . DISCONTD: clindamycin (CLEOCIN) IV  600 mg Intravenous 60 min Pre-Op   Assessment: 50 yo female with osteomylelitis of R great toe, s/p amputation, culture with MSSA.  Pharmacy asked to begin vancomycin.  Also asked to start Coumadin for post-op VTE prophylaxis.  Goal of Therapy:  Vancomycin trough level 15-20 mcg/ml INR 2-3  Plan:  1. Vancomycin 1500 mg IV x 1 now, and then 1g after HD  today, and subsequent dialysis sessions.  Would recommend narrowing therapy to cephalosporin (Ancef) since culture w/ MSSA. 2. Checking baseline INR now, as long as WNL will give Coumadin 5 mg x1 tonight. 3. Daily INR, vancomycin trough at steady state as needed.   Larose Batres C 03/04/2011,1:24 PM

## 2011-03-04 NOTE — Anesthesia Preprocedure Evaluation (Addendum)
Anesthesia Evaluation  Patient identified by MRN, date of birth, ID band Patient awake    Reviewed: Allergy & Precautions, H&P , NPO status , Patient's Chart, lab work & pertinent test results  Airway Mallampati: III TM Distance: <3 FB Neck ROM: Full    Dental No notable dental hx.    Pulmonary  clear to auscultation  Pulmonary exam normal       Cardiovascular hypertension, Pt. on medications + Peripheral Vascular Disease Regular Normal    Neuro/Psych Negative Neurological ROS  Negative Psych ROS   GI/Hepatic negative GI ROS, Neg liver ROS,   Endo/Other  Diabetes mellitus-, Well Controlled, Type 2, Insulin DependentMorbid obesitySecondary Hyperparathyroidism  Renal/GU CRF and DialysisRenal disease  Genitourinary negative   Musculoskeletal   Abdominal Normal abdominal exam  (+)  Abdomen: soft.    Peds  Hematology   Anesthesia Other Findings   Reproductive/Obstetrics                         Anesthesia Physical Anesthesia Plan  ASA: IV  Anesthesia Plan: General   Post-op Pain Management:    Induction: Intravenous  Airway Management Planned: LMA  Additional Equipment:   Intra-op Plan:   Post-operative Plan:   Informed Consent: I have reviewed the patients History and Physical, chart, labs and discussed the procedure including the risks, benefits and alternatives for the proposed anesthesia with the patient or authorized representative who has indicated his/her understanding and acceptance.   Dental advisory given  Plan Discussed with: CRNA, Anesthesiologist and Surgeon  Anesthesia Plan Comments:     Anesthesia Quick Evaluation

## 2011-03-04 NOTE — H&P (View-Only) (Signed)
  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 Will plan for amputation of the right great toe Friday morning. Postoperatively patient may be partial weightbearing on the right lower extremity. Plan for 4 weeks of by mouth antibiotics at discharge.

## 2011-03-04 NOTE — Anesthesia Procedure Notes (Signed)
Procedure Name: LMA Insertion Date/Time: 03/04/2011 8:56 AM Performed by: Caryn Bee Pre-anesthesia Checklist: Patient identified and Patient being monitored Patient Re-evaluated:Patient Re-evaluated prior to inductionOxygen Delivery Method: Circle System Utilized Preoxygenation: Pre-oxygenation with 100% oxygen Intubation Type: IV induction Ventilation: Mask ventilation without difficulty LMA: LMA inserted LMA Size: 4.0 Number of attempts: 1 Tube secured with: Tape Dental Injury: Teeth and Oropharynx as per pre-operative assessment

## 2011-03-04 NOTE — Interval H&P Note (Signed)
History and Physical Interval Note:   03/04/2011   8:41 AM   [name]  has presented today for surgery, with the diagnosis of OSTEO RT GREAT TOE  The various methods of treatment have been discussed with the patient and family. After consideration of risks, benefits and other options for treatment, the patient has consented to  Procedure(s): AMPUTATION DIGIT as a surgical intervention .  The patients' history has been reviewed, patient examined, no change in status, stable for surgery.  I have reviewed the patients' chart and labs.  Questions were answered to the patient's satisfaction.     [name]  MD

## 2011-03-05 ENCOUNTER — Inpatient Hospital Stay (HOSPITAL_COMMUNITY): Payer: Medicare Other

## 2011-03-05 LAB — GLUCOSE, CAPILLARY
Glucose-Capillary: 142 mg/dL — ABNORMAL HIGH (ref 70–99)
Glucose-Capillary: 163 mg/dL — ABNORMAL HIGH (ref 70–99)

## 2011-03-05 LAB — RENAL FUNCTION PANEL
Albumin: 2.9 g/dL — ABNORMAL LOW (ref 3.5–5.2)
BUN: 48 mg/dL — ABNORMAL HIGH (ref 6–23)
CO2: 24 mEq/L (ref 19–32)
Chloride: 93 mEq/L — ABNORMAL LOW (ref 96–112)
Potassium: 4.6 mEq/L (ref 3.5–5.1)

## 2011-03-05 LAB — PROTIME-INR: Prothrombin Time: 13.9 seconds (ref 11.6–15.2)

## 2011-03-05 LAB — CBC
HCT: 30.4 % — ABNORMAL LOW (ref 36.0–46.0)
Hemoglobin: 9.6 g/dL — ABNORMAL LOW (ref 12.0–15.0)
MCV: 87.9 fL (ref 78.0–100.0)
RBC: 3.46 MIL/uL — ABNORMAL LOW (ref 3.87–5.11)
RDW: 15.6 % — ABNORMAL HIGH (ref 11.5–15.5)
WBC: 5 10*3/uL (ref 4.0–10.5)

## 2011-03-05 MED ORDER — AMLODIPINE BESYLATE 10 MG PO TABS: 10.0000 mg | ORAL_TABLET | Freq: Every day | ORAL | Status: DC

## 2011-03-05 MED ORDER — ONDANSETRON HCL 4 MG/2ML IJ SOLN
INTRAMUSCULAR | Status: AC
  Filled 2011-03-05: qty 2

## 2011-03-05 MED ORDER — PARICALCITOL 5 MCG/ML IV SOLN
INTRAVENOUS | Status: AC
  Administered 2011-03-05: 2 ug via INTRAVENOUS
  Filled 2011-03-05: qty 1

## 2011-03-05 MED ORDER — ONDANSETRON HCL 4 MG/2ML IJ SOLN
INTRAMUSCULAR | Status: AC
  Administered 2011-03-05: 4 mg via INTRAVENOUS
  Filled 2011-03-05: qty 2

## 2011-03-05 MED ORDER — OXYCODONE-ACETAMINOPHEN 5-325 MG PO TABS: 1.0000 | ORAL_TABLET | Freq: Four times a day (QID) | ORAL | Status: DC | PRN

## 2011-03-05 NOTE — Progress Notes (Signed)
ANTIBIOTIC CONSULT NOTE - INITIAL  Pharmacy Consult for Vancomycin and Warfarin Indication: osteomyelitis, s/p amputation of R great toe, DVT prophylaxis  Allergies  Allergen Reactions  . Codeine   . Levaquin   . Penicillins     Patient Measurements: Height: 5\' 3"  (160 cm) Weight: 216 lb 0.8 oz (98 kg) (pre hd) IBW/kg (Calculated) : 52.4  Adjusted Body Weight: n/a  Vital Signs: Temp: 96.8 F (36 C) (11/10 0710) Temp src: Oral (11/10 0710) BP: 120/63 mmHg (11/10 0830) Pulse Rate: 65  (11/10 0830) Intake/Output from previous day: 11/09 0701 - 11/10 0700 In: 680 [P.O.:480; I.V.:200] Out: -  Intake/Output from this shift:    Labs:  Basename 03/04/11 0816  WBC --  HGB 11.6*  PLT --  LABCREA --  CREATININE --   Estimated Creatinine Clearance: 9.6 ml/min (by C-G formula based on Cr of 7.96).   Microbiology: Recent Results (from the past 720 hour(s))  CULTURE, BLOOD (ROUTINE X 2)     Status: Normal (Preliminary result)   Collection Time   03/01/11  5:55 PM      Component Value Range Status Comment   Specimen Description BLOOD ARM RIGHT   Final    Special Requests BOTTLES DRAWN AEROBIC ONLY 3CC   Final    Setup Time [id number]   Final    Culture     Final    Value:        BLOOD CULTURE RECEIVED NO GROWTH TO DATE CULTURE WILL BE HELD FOR 5 DAYS BEFORE ISSUING A FINAL NEGATIVE REPORT   Report Status PENDING   Incomplete   CULTURE, BLOOD (ROUTINE X 2)     Status: Normal (Preliminary result)   Collection Time   03/01/11  6:05 PM      Component Value Range Status Comment   Specimen Description BLOOD ARM RIGHT   Final    Special Requests BOTTLES DRAWN AEROBIC ONLY 3CC LOWER   Final    Setup Time [id number]   Final    Culture     Final    Value:        BLOOD CULTURE RECEIVED NO GROWTH TO DATE CULTURE WILL BE HELD FOR 5 DAYS BEFORE ISSUING A FINAL NEGATIVE REPORT   Report Status PENDING   Incomplete   WOUND CULTURE     Status: Normal   Collection Time   03/01/11   7:01 PM      Component Value Range Status Comment   Specimen Description WOUND TOE   Final    Special Requests NONE   Final    Gram Stain     Final    Value: ABUNDANT WBC PRESENT, PREDOMINANTLY PMN     RARE SQUAMOUS EPITHELIAL CELLS PRESENT     ABUNDANT GRAM POSITIVE COCCI IN PAIRS     IN CLUSTERS RARE GRAM NEGATIVE RODS   Culture     Final    Value: ABUNDANT STAPHYLOCOCCUS AUREUS     Note: RIFAMPIN AND GENTAMICIN SHOULD NOT BE USED AS SINGLE DRUGS FOR TREATMENT OF STAPH INFECTIONS. This organism DOES NOT demonstrate inducible Clindamycin resistance in vitro.   Report Status 03/04/2011 FINAL   Final    Organism ID, Bacteria STAPHYLOCOCCUS AUREUS   Final     Medical History: Past Medical History  Diagnosis Date  . Hypertension   . Anemia   . Diabetes mellitus   . Renal failure     Medications:  Scheduled:     . amLODipine  10 mg Oral Daily  .  bisoprolol-hydrochlorothiazide  1 tablet Oral Daily  . calcium acetate (Phos Binder)  1,334 mg Oral TID WC  . clindamycin (CLEOCIN) IV  600 mg Intravenous Once  . clindamycin (CLEOCIN) IV  600 mg Intravenous Q8H  . coumadin book   Does not apply Once  . darbepoetin  25 mcg Subcutaneous Q Wed-HD  . heparin  5,000 Units Subcutaneous Q8H  . insulin aspart  0-15 Units Subcutaneous TID WC  . insulin aspart  0-5 Units Subcutaneous QHS  . insulin aspart  8 Units Subcutaneous Daily  . insulin glargine  12 Units Subcutaneous QPM  . paricalcitol  2 mcg Intravenous 3 times weekly  . vancomycin  1,500 mg Intravenous NOW  . warfarin  5 mg Oral ONCE-1800  . warfarin   Does not apply Once  . DISCONTD: chlorhexidine  60 mL Topical Once  . DISCONTD: clindamycin (CLEOCIN) IV  600 mg Intravenous 60 min Pre-Op   Assessment: 50 yo female with osteomylelitis of R great toe, s/p amputation, culture with MSSA.  Pharmacy asked to begin vancomycin.  Also asked to start Coumadin for post-op VTE prophylaxis.  Goal of Therapy:  Vancomycin trough level  15-20 mcg/ml INR 2-3  Plan:  1. Continue vancomycin per HD session for now, hope for d/c given MSSA in toe 2. Coumadin 10 mg this evening after 5 mg 11/9 3. Daily INR, vancomycin trough at steady state as needed   Katrinka Blazing, [place] R 03/05/2011,9:38 AM

## 2011-03-05 NOTE — Progress Notes (Signed)
  Pt was seen and examined while on dialysis

## 2011-03-05 NOTE — Progress Notes (Signed)
[name], 50 y.o., DOB February 28, 1961, MRN [id number]. Admission date: 03/01/2011 Discharge Date 03/05/2011 Primary MD No primary provider on file. Admitting Physician Antony Blackbird Rizwan  Admission Diagnosis  Cellulitis [682.9] Osteomyelitis [730.20] Renal failure, acute on chronic [584.9] per dr orders hallux/cellulitits OSTEO RT GREAT TOE  Discharge Diagnosis   Principal Problem:  *Diabetic foot ulcer Active Problems:  Diabetes mellitus  HTN (hypertension)  Anemia  ESRD (end stage renal disease) on dialysis  Obesities, morbid  Osteomyelitis  Secondary hyperparathyroidism (of renal origin)  Anemia of chronic disease  Complication of vascular access for dialysis     Past Medical History  Diagnosis Date  . Hypertension   . Anemia   . Diabetes mellitus   . Renal failure     Past Surgical History  Procedure Date  . Leg amputation below knee     left  . Insertion of dialysis catheter   . Arteriovenous graft placement   . Below knee leg amputation     Consults  orthopedic surgery                   nephrology  Significant Tests:  See full reports for all details    Dg Toe Great Right  03/01/2011  *RADIOLOGY REPORT*  Clinical Data: Open wound.  RIGHT TOE - 2+ VIEW  Comparison: None  Findings: Soft tissue thickening and irregularity noted.  There is destructive bony change involving the distal tuft region of the distal phalanx suggesting osteomyelitis.  Joint spaces are maintained.   IMPRESSION: Plain film findings suspicious for osteomyelitis involving the distal tuft region of the distal phalanx.  Original Report Authenticated By: P. Loralie Champagne, M.D.   Brief H&P This is a 50 year old female with a past medical history of hypertension diabetes and end-stage kidney disease on hemodialysis.  The patient went to see a podiatrist today (Dr. Leeanne Deed) for an ulcer on her right foot. She states that there was "hard skin present there" which peeled off and resulted in an  ulcer. She states that the ulcer is been there for the past 2 days. She has not noticed any discharge from the ulcer. She has not noticed any redness or increased swelling of her toe or foot. She has not had any fever or chills.She does not complain of any pain or numbness in her feet. As far as she knows she does not have neuropathy.  After evaluation in the podiatrist's office she was sent to the hospital    Today   Subjective:   [name] today has only mild pain in the foot. Well controlled on po opiods. Patient seen at hemodialysis.  Objective:   Blood pressure 143/63, pulse 69, temperature 96.8 F (36 C), temperature source Oral, resp. rate 24, height 5\' 3"  (1.6 m), weight 98 kg (216 lb 0.8 oz), SpO2 99.00%.  Intake/Output Summary (Last 24 hours) at 03/05/11 1111 Last data filed at 03/04/11 1500  Gross per 24 hour  Intake    480 ml  Output      0 ml  Net    480 ml    Exam Awake Alert, Oriented 3, No new F.N deficits, Normal affect Yeoman.AT,PERRAL Supple Neck,No JVD, No cervical lymphadenopathy appriciated.  Symmetrical Chest wall movement, Good air movement bilaterally, CTAB RRR,No Gallops,Rubs or new Murmurs, No Parasternal Heave +ve B.Sounds, Abd Soft, Non tender, No organomegaly appriciated, No rebound -guarding or rigidity. Trace edema of lower extremities.   Hospital Course: #1 osteomyelitis of the right great toe:  Patient was sent to the hospital from the podiatrist's office for the diabetic foot ulcer. She was started on antibiotics vancomycin and clindamycin. Orthopedics consultation was obtained and patient underwent amputation of the right great toe. I called and discussed with Dr. Lajoyce Corners , and he recommended to discharge the patient without any antibiotics at this time. Patient to follow with Dr. Lajoyce Corners in one week. Patient will be given by mouth Percocet for the pain control.  #2 end-stage renal disease: Patient was seen by nephrology in the hospital. She will  continue the outpatient dialysis after discharge from the hospital.  #3 hypertension: Patient will continue bisoprolol-hydrochlorothiazide, and amlodipine which was ordered in the hospital.  #4 diabetes mellitus: Patient will continue to take Lantus, and insulin aspart at her usual home dose.  #5 anemia: Hemoglobin is stable at this time. Patient has anemia of chronic disease.   Data Review     Cultures -  Results for orders placed during the hospital encounter of 03/01/11  CULTURE, BLOOD (ROUTINE X 2)     Status: Normal (Preliminary result)   Collection Time   03/01/11  5:55 PM      Component Value Range Status Comment   Specimen Description BLOOD ARM RIGHT   Final    Special Requests BOTTLES DRAWN AEROBIC ONLY 3CC   Final    Setup Time [id number]   Final    Culture     Final    Value:        BLOOD CULTURE RECEIVED NO GROWTH TO DATE CULTURE WILL BE HELD FOR 5 DAYS BEFORE ISSUING A FINAL NEGATIVE REPORT   Report Status PENDING   Incomplete   CULTURE, BLOOD (ROUTINE X 2)     Status: Normal (Preliminary result)   Collection Time   03/01/11  6:05 PM      Component Value Range Status Comment   Specimen Description BLOOD ARM RIGHT   Final    Special Requests BOTTLES DRAWN AEROBIC ONLY 3CC LOWER   Final    Setup Time [id number]   Final    Culture     Final    Value:        BLOOD CULTURE RECEIVED NO GROWTH TO DATE CULTURE WILL BE HELD FOR 5 DAYS BEFORE ISSUING A FINAL NEGATIVE REPORT   Report Status PENDING   Incomplete   WOUND CULTURE     Status: Normal   Collection Time   03/01/11  7:01 PM      Component Value Range Status Comment   Specimen Description WOUND TOE   Final    Special Requests NONE   Final    Gram Stain     Final    Value: ABUNDANT WBC PRESENT, PREDOMINANTLY PMN     RARE SQUAMOUS EPITHELIAL CELLS PRESENT     ABUNDANT GRAM POSITIVE COCCI IN PAIRS     IN CLUSTERS RARE GRAM NEGATIVE RODS   Culture     Final    Value: ABUNDANT STAPHYLOCOCCUS AUREUS     Note:  RIFAMPIN AND GENTAMICIN SHOULD NOT BE USED AS SINGLE DRUGS FOR TREATMENT OF STAPH INFECTIONS. This organism DOES NOT demonstrate inducible Clindamycin resistance in vitro.   Report Status 03/04/2011 FINAL   Final    Organism ID, Bacteria STAPHYLOCOCCUS AUREUS   Final      CBC w Diff: Lab Results  Component Value Date   WBC 5.0 03/05/2011   HGB 9.6* 03/05/2011   HCT 30.4* 03/05/2011   PLT 178 03/05/2011   LYMPHOPCT  19 03/01/2011   MONOPCT 5 03/01/2011   EOSPCT 2 03/01/2011   BASOPCT 1 03/01/2011   CMP: Lab Results  Component Value Date   NA 133* 03/04/2011   K 4.6 03/04/2011   CL 99 03/02/2011   CO2 21 03/02/2011   BUN 53* 03/02/2011   CREATININE 7.96* 03/02/2011   PROT 7.1 10/08/2009   ALBUMIN 2.0* 10/08/2009   BILITOT 0.6 10/08/2009   ALKPHOS 236* 10/08/2009   AST 13 10/08/2009   ALT 9 10/08/2009    Follow Up: Dr Lajoyce Corners in one week   Discharge Medications   Medication List  As of 03/05/2011 11:11 AM   START taking these medications         amLODipine 10 MG tablet   Commonly known as: NORVASC   Take 1 tablet (10 mg total) by mouth daily.      oxyCODONE-acetaminophen 5-325 MG per tablet   Commonly known as: PERCOCET   Take 1 tablet by mouth every 6 (six) hours as needed.         CONTINUE taking these medications         bisoprolol-hydrochlorothiazide 5-6.25 MG per tablet   Commonly known as: ZIAC      calcium acetate (Phos Binder) 667 MG/5ML Soln   Commonly known as: PHOSLYRA      insulin aspart 100 UNIT/ML injection   Commonly known as: novoLOG      insulin glargine 100 UNIT/ML injection   Commonly known as: LANTUS          Where to get your medications    These are the prescriptions that you need to pick up.   You may get these medications from any pharmacy.         amLODipine 10 MG tablet   oxyCODONE-acetaminophen 5-325 MG per tablet            Total Time in preparing paper work, data evaluation and todays exam - 35 minutes  Signed: Selso Mannor  S 03/05/2011 11:11 AM  The patient was admitted to the Hospitalist Service.

## 2011-03-05 NOTE — Progress Notes (Signed)
Subjective: Interval History: No complaints  Objective: Vital signs in last 24 hours: Temp:  [96.8 F (36 C)-98 F (36.7 C)] 96.8 F (36 C) (11/10 0710) Pulse Rate:  [61-71] 65  (11/10 0800) Resp:  [12-25] 24  (11/10 0710) BP: (119-150)/(58-80) 119/59 mmHg (11/10 0800) SpO2:  [92 %-99 %] 99 % (11/10 0710) Weight:  [97.75 kg (215 lb 8 oz)-98 kg (216 lb 0.8 oz)] 216 lb 0.8 oz (98 kg) (11/10 0710) Weight change:   Intake/Output from previous day: 11/09 0701 - 11/10 0700 In: 680 [P.O.:480; I.V.:200] Out: -  Intake/Output this shift:    General appearance: alert and no distress Resp: clear to auscultation bilaterally Cardio: regular rate and rhythm, S1, S2 normal, no murmur, click, rub or gallop Extremities: s/p right great toe amputation  Lab Results:  Basename 03/04/11 0816  WBC --  HGB 11.6*  HCT 34.0*  PLT --   BMET:  Basename 03/04/11 0816  NA 133*  K 4.6  CL --  CO2 --  GLUCOSE 134*  BUN --  CREATININE --  CALCIUM --   No results found for this basename: PTH:2 in the last 72 hours Iron Studies: No results found for this basename: IRON,TIBC,TRANSFERRIN,FERRITIN in the last 72 hours  Studies/Results: No results found.  I have reviewed the patient's current medications.  Assessment/Plan: 1.Staph Osteo Rt great toe SP Amputatio  2. DM  3. ESRD-pt back on schedule of TTS 4. Anemia on Aranesp  5. Sec hpth on [facility] 6. Dispo- possible d/c today     LOS: 4 days   Keaun Schnabel A 03/05/2011,8:36 AM

## 2011-03-07 LAB — CULTURE, BLOOD (ROUTINE X 2)
Culture  Setup Time: 201211062358
Culture: NO GROWTH

## 2011-03-09 ENCOUNTER — Encounter (HOSPITAL_COMMUNITY): Payer: Self-pay | Admitting: Orthopedic Surgery

## 2011-03-15 ENCOUNTER — Emergency Department (HOSPITAL_COMMUNITY): Payer: Medicare Other

## 2011-03-15 ENCOUNTER — Other Ambulatory Visit: Payer: Self-pay | Admitting: Dermatology

## 2011-03-15 ENCOUNTER — Encounter (HOSPITAL_COMMUNITY): Payer: Self-pay

## 2011-03-15 ENCOUNTER — Inpatient Hospital Stay (HOSPITAL_COMMUNITY)
Admission: EM | Admit: 2011-03-15 | Discharge: 2011-03-21 | DRG: 853 | Disposition: A | Discharge status: Home / Self Care | Payer: Medicare Other | Attending: Internal Medicine | Admitting: Internal Medicine

## 2011-03-15 DIAGNOSIS — M869 Osteomyelitis, unspecified: Secondary | ICD-10-CM | POA: Diagnosis present

## 2011-03-15 DIAGNOSIS — Z880 Allergy status to penicillin: Secondary | ICD-10-CM

## 2011-03-15 DIAGNOSIS — E1169 Type 2 diabetes mellitus with other specified complication: Secondary | ICD-10-CM | POA: Diagnosis present

## 2011-03-15 DIAGNOSIS — S98119A Complete traumatic amputation of unspecified great toe, initial encounter: Secondary | ICD-10-CM

## 2011-03-15 DIAGNOSIS — A4189 Other specified sepsis: Principal | ICD-10-CM | POA: Diagnosis present

## 2011-03-15 DIAGNOSIS — I10 Essential (primary) hypertension: Secondary | ICD-10-CM | POA: Diagnosis present

## 2011-03-15 DIAGNOSIS — I739 Peripheral vascular disease, unspecified: Secondary | ICD-10-CM | POA: Diagnosis present

## 2011-03-15 DIAGNOSIS — B49 Unspecified mycosis: Secondary | ICD-10-CM | POA: Diagnosis present

## 2011-03-15 DIAGNOSIS — N390 Urinary tract infection, site not specified: Secondary | ICD-10-CM

## 2011-03-15 DIAGNOSIS — A419 Sepsis, unspecified organism: Secondary | ICD-10-CM

## 2011-03-15 DIAGNOSIS — D692 Other nonthrombocytopenic purpura: Secondary | ICD-10-CM | POA: Diagnosis present

## 2011-03-15 DIAGNOSIS — D649 Anemia, unspecified: Secondary | ICD-10-CM | POA: Diagnosis present

## 2011-03-15 DIAGNOSIS — Z992 Dependence on renal dialysis: Secondary | ICD-10-CM

## 2011-03-15 DIAGNOSIS — N186 End stage renal disease: Secondary | ICD-10-CM | POA: Diagnosis present

## 2011-03-15 DIAGNOSIS — R21 Rash and other nonspecific skin eruption: Secondary | ICD-10-CM

## 2011-03-15 DIAGNOSIS — N2581 Secondary hyperparathyroidism of renal origin: Secondary | ICD-10-CM | POA: Diagnosis present

## 2011-03-15 DIAGNOSIS — Z9889 Other specified postprocedural states: Secondary | ICD-10-CM

## 2011-03-15 DIAGNOSIS — S88119A Complete traumatic amputation at level between knee and ankle, unspecified lower leg, initial encounter: Secondary | ICD-10-CM

## 2011-03-15 DIAGNOSIS — E1165 Type 2 diabetes mellitus with hyperglycemia: Secondary | ICD-10-CM | POA: Diagnosis present

## 2011-03-15 DIAGNOSIS — E875 Hyperkalemia: Secondary | ICD-10-CM | POA: Diagnosis present

## 2011-03-15 DIAGNOSIS — D638 Anemia in other chronic diseases classified elsewhere: Secondary | ICD-10-CM | POA: Diagnosis present

## 2011-03-15 DIAGNOSIS — M310 Hypersensitivity angiitis: Secondary | ICD-10-CM | POA: Diagnosis present

## 2011-03-15 DIAGNOSIS — L97509 Non-pressure chronic ulcer of other part of unspecified foot with unspecified severity: Secondary | ICD-10-CM | POA: Diagnosis present

## 2011-03-15 DIAGNOSIS — B379 Candidiasis, unspecified: Secondary | ICD-10-CM | POA: Diagnosis present

## 2011-03-15 DIAGNOSIS — Z886 Allergy status to analgesic agent status: Secondary | ICD-10-CM

## 2011-03-15 DIAGNOSIS — T82598A Other mechanical complication of other cardiac and vascular devices and implants, initial encounter: Secondary | ICD-10-CM | POA: Diagnosis present

## 2011-03-15 DIAGNOSIS — I120 Hypertensive chronic kidney disease with stage 5 chronic kidney disease or end stage renal disease: Secondary | ICD-10-CM | POA: Diagnosis present

## 2011-03-15 DIAGNOSIS — D65 Disseminated intravascular coagulation [defibrination syndrome]: Secondary | ICD-10-CM | POA: Diagnosis present

## 2011-03-15 DIAGNOSIS — Z888 Allergy status to other drugs, medicaments and biological substances status: Secondary | ICD-10-CM

## 2011-03-15 HISTORY — DX: Peripheral vascular disease, unspecified: I73.9

## 2011-03-15 HISTORY — DX: Rash and other nonspecific skin eruption: R21

## 2011-03-15 HISTORY — DX: Reserved for inherently not codable concepts without codable children: IMO0001

## 2011-03-15 HISTORY — DX: Dorsalgia, unspecified: M54.9

## 2011-03-15 HISTORY — DX: Dependence on renal dialysis: Z99.2

## 2011-03-15 HISTORY — DX: Other chronic pain: G89.29

## 2011-03-15 HISTORY — DX: Encounter for other specified aftercare: Z51.89

## 2011-03-15 LAB — DIC (DISSEMINATED INTRAVASCULAR COAGULATION)PANEL
INR: 1.09 (ref 0.00–1.49)
Smear Review: NONE SEEN
aPTT: 41 seconds — ABNORMAL HIGH (ref 24–37)

## 2011-03-15 LAB — CBC
HCT: 33.4 % — ABNORMAL LOW (ref 36.0–46.0)
Platelets: 179 10*3/uL (ref 150–400)
RBC: 3.88 MIL/uL (ref 3.87–5.11)
RDW: 15.1 % (ref 11.5–15.5)
WBC: 6.9 10*3/uL (ref 4.0–10.5)

## 2011-03-15 LAB — BASIC METABOLIC PANEL
CO2: 21 mEq/L (ref 19–32)
Calcium: 8.1 mg/dL — ABNORMAL LOW (ref 8.4–10.5)
Creatinine, Ser: 8.75 mg/dL — ABNORMAL HIGH (ref 0.50–1.10)
GFR calc Af Amer: 6 mL/min — ABNORMAL LOW (ref >90)
GFR calc non Af Amer: 5 mL/min — ABNORMAL LOW (ref >90)
Sodium: 131 mEq/L — ABNORMAL LOW (ref 135–145)

## 2011-03-15 LAB — PROTIME-INR
INR: 1.13 (ref 0.00–1.49)
Prothrombin Time: 14.7 seconds (ref 11.6–15.2)

## 2011-03-15 LAB — LIPID PANEL
LDL Cholesterol: 130 mg/dL — ABNORMAL HIGH (ref 0–99)
Triglycerides: 108 mg/dL (ref <150)

## 2011-03-15 LAB — HEMOGLOBIN A1C: Mean Plasma Glucose: 128 mg/dL — ABNORMAL HIGH (ref <117)

## 2011-03-15 LAB — GLUCOSE, CAPILLARY

## 2011-03-15 LAB — HEPATIC FUNCTION PANEL
ALT: 11 U/L (ref 0–35)
AST: 18 U/L (ref 0–37)
Albumin: 4 g/dL (ref 3.5–5.2)
Total Protein: 9.8 g/dL — ABNORMAL HIGH (ref 6.0–8.3)

## 2011-03-15 LAB — POTASSIUM: Potassium: 6.8 mEq/L (ref 3.5–5.1)

## 2011-03-15 MED ORDER — OXYCODONE HCL 5 MG PO TABS
ORAL_TABLET | ORAL | Status: AC
  Administered 2011-03-15: 5 mg via ORAL
  Filled 2011-03-15: qty 1

## 2011-03-15 MED ORDER — ONDANSETRON HCL 4 MG PO TABS: 4.0000 mg | ORAL_TABLET | Freq: Four times a day (QID) | ORAL | Status: DC | PRN

## 2011-03-15 MED ORDER — DIPHENHYDRAMINE HCL 50 MG/ML IJ SOLN
12.5000 mg | Freq: Four times a day (QID) | INTRAMUSCULAR | Status: DC | PRN
  Administered 2011-03-16 – 2011-03-17 (×3): 12.5 mg via INTRAVENOUS
  Filled 2011-03-15 (×2): qty 1

## 2011-03-15 MED ORDER — SODIUM CHLORIDE 0.9 % IV SOLN: 100.0000 mL | INTRAVENOUS | Status: DC | PRN

## 2011-03-15 MED ORDER — DARBEPOETIN ALFA-POLYSORBATE 60 MCG/0.3ML IJ SOLN
60.0000 ug | INTRAMUSCULAR | Status: DC
  Administered 2011-03-15: 60 ug via INTRAVENOUS
  Filled 2011-03-15: qty 0.3

## 2011-03-15 MED ORDER — HEPARIN SODIUM (PORCINE) 1000 UNIT/ML DIALYSIS
1000.0000 [IU] | INTRAMUSCULAR | Status: DC | PRN
  Administered 2011-03-15: 1000 [IU] via INTRAVENOUS_CENTRAL
  Filled 2011-03-15: qty 1

## 2011-03-15 MED ORDER — PROMETHAZINE HCL 12.5 MG PO TABS
12.5000 mg | ORAL_TABLET | Freq: Four times a day (QID) | ORAL | Status: DC | PRN
  Filled 2011-03-15: qty 1

## 2011-03-15 MED ORDER — INSULIN ASPART 100 UNIT/ML ~~LOC~~ SOLN
0.0000 [IU] | Freq: Three times a day (TID) | SUBCUTANEOUS | Status: DC
  Administered 2011-03-16: 7 [IU] via SUBCUTANEOUS
  Administered 2011-03-16: 5 [IU] via SUBCUTANEOUS

## 2011-03-15 MED ORDER — HYDROMORPHONE HCL PF 1 MG/ML IJ SOLN
1.0000 mg | INTRAMUSCULAR | Status: DC | PRN
  Administered 2011-03-15 (×2): 1 mg via INTRAVENOUS
  Filled 2011-03-15 (×2): qty 1

## 2011-03-15 MED ORDER — HYDROXYZINE HCL 25 MG PO TABS: 25.0000 mg | ORAL_TABLET | Freq: Three times a day (TID) | ORAL | Status: DC | PRN

## 2011-03-15 MED ORDER — LIDOCAINE HCL (PF) 1 % IJ SOLN
5.0000 mL | INTRAMUSCULAR | Status: DC | PRN
  Filled 2011-03-15: qty 5

## 2011-03-15 MED ORDER — PROMETHAZINE HCL 25 MG RE SUPP: 25.0000 mg | Freq: Two times a day (BID) | RECTAL | Status: DC | PRN

## 2011-03-15 MED ORDER — DOCUSATE SODIUM 283 MG RE ENEM: 1.0000 | ENEMA | RECTAL | Status: DC | PRN

## 2011-03-15 MED ORDER — ACETAMINOPHEN 325 MG PO TABS: 650.0000 mg | ORAL_TABLET | Freq: Four times a day (QID) | ORAL | Status: DC | PRN

## 2011-03-15 MED ORDER — SORBITOL 70 % SOLN: 30.0000 mL | Status: DC | PRN

## 2011-03-15 MED ORDER — ALTEPLASE 2 MG IJ SOLR: 2.0000 mg | Freq: Once | INTRAMUSCULAR | Status: AC | PRN

## 2011-03-15 MED ORDER — PENTAFLUOROPROP-TETRAFLUOROETH EX AERO
1.0000 "application " | INHALATION_SPRAY | CUTANEOUS | Status: DC | PRN
  Filled 2011-03-15: qty 103.5

## 2011-03-15 MED ORDER — OXYCODONE HCL 5 MG PO TABS
5.0000 mg | ORAL_TABLET | ORAL | Status: DC | PRN
  Administered 2011-03-15 – 2011-03-17 (×3): 5 mg via ORAL
  Filled 2011-03-15 (×2): qty 1

## 2011-03-15 MED ORDER — HEPARIN SODIUM (PORCINE) 1000 UNIT/ML DIALYSIS: 20.0000 [IU]/kg | INTRAMUSCULAR | Status: DC | PRN

## 2011-03-15 MED ORDER — HEPARIN SODIUM (PORCINE) 1000 UNIT/ML DIALYSIS
20.0000 [IU]/kg | INTRAMUSCULAR | Status: DC | PRN
  Filled 2011-03-15: qty 2

## 2011-03-15 MED ORDER — PARICALCITOL 5 MCG/ML IV SOLN
INTRAVENOUS | Status: AC
  Administered 2011-03-15: 2 ug via INTRAVENOUS
  Filled 2011-03-15: qty 1

## 2011-03-15 MED ORDER — ONDANSETRON HCL 4 MG/2ML IJ SOLN: 4.0000 mg | Freq: Four times a day (QID) | INTRAMUSCULAR | Status: DC | PRN

## 2011-03-15 MED ORDER — PARICALCITOL 5 MCG/ML IV SOLN
2.0000 ug | INTRAVENOUS | Status: DC
  Administered 2011-03-15 – 2011-03-19 (×2): 2 ug via INTRAVENOUS
  Filled 2011-03-15 (×2): qty 0.4

## 2011-03-15 MED ORDER — SENNOSIDES-DOCUSATE SODIUM 8.6-50 MG PO TABS
1.0000 | ORAL_TABLET | Freq: Every day | ORAL | Status: DC | PRN
  Filled 2011-03-15: qty 1

## 2011-03-15 MED ORDER — ACETAMINOPHEN 650 MG RE SUPP: 650.0000 mg | Freq: Four times a day (QID) | RECTAL | Status: DC | PRN

## 2011-03-15 MED ORDER — FAMOTIDINE IN NACL 20-0.9 MG/50ML-% IV SOLN
20.0000 mg | Freq: Two times a day (BID) | INTRAVENOUS | Status: DC
  Administered 2011-03-15 (×2): 20 mg via INTRAVENOUS
  Filled 2011-03-15 (×3): qty 50

## 2011-03-15 MED ORDER — DIPHENHYDRAMINE HCL 50 MG/ML IJ SOLN
INTRAMUSCULAR | Status: AC
  Filled 2011-03-15: qty 1

## 2011-03-15 MED ORDER — FAMOTIDINE IN NACL 20-0.9 MG/50ML-% IV SOLN
20.0000 mg | Freq: Two times a day (BID) | INTRAVENOUS | Status: DC
  Administered 2011-03-15 – 2011-03-16 (×3): 20 mg via INTRAVENOUS
  Filled 2011-03-15 (×3): qty 50

## 2011-03-15 MED ORDER — INSULIN GLARGINE 100 UNIT/ML ~~LOC~~ SOLN
12.0000 [IU] | Freq: Every day | SUBCUTANEOUS | Status: DC
  Administered 2011-03-15: 12 [IU] via SUBCUTANEOUS
  Filled 2011-03-15: qty 3

## 2011-03-15 MED ORDER — METHYLPREDNISOLONE SODIUM SUCC 125 MG IJ SOLR
125.0000 mg | Freq: Once | INTRAMUSCULAR | Status: AC
  Administered 2011-03-15: 125 mg via INTRAVENOUS
  Filled 2011-03-15: qty 2

## 2011-03-15 MED ORDER — CAMPHOR-MENTHOL 0.5-0.5 % EX LOTN
1.0000 "application " | TOPICAL_LOTION | Freq: Three times a day (TID) | CUTANEOUS | Status: DC | PRN
  Filled 2011-03-15: qty 222

## 2011-03-15 MED ORDER — NEPRO/CARBSTEADY PO LIQD: 237.0000 mL | Freq: Three times a day (TID) | ORAL | Status: DC | PRN

## 2011-03-15 MED ORDER — AMLODIPINE BESYLATE 5 MG PO TABS
5.0000 mg | ORAL_TABLET | Freq: Every day | ORAL | Status: DC
  Administered 2011-03-15 – 2011-03-18 (×4): 5 mg via ORAL
  Filled 2011-03-15 (×5): qty 1

## 2011-03-15 MED ORDER — DIPHENHYDRAMINE HCL 50 MG/ML IJ SOLN
12.5000 mg | Freq: Four times a day (QID) | INTRAMUSCULAR | Status: DC | PRN
  Administered 2011-03-15 – 2011-03-16 (×2): 12.5 mg via INTRAVENOUS
  Filled 2011-03-15 (×2): qty 1

## 2011-03-15 MED ORDER — ZOLPIDEM TARTRATE 5 MG PO TABS
5.0000 mg | ORAL_TABLET | Freq: Every evening | ORAL | Status: DC | PRN
  Administered 2011-03-16 – 2011-03-20 (×3): 5 mg via ORAL
  Filled 2011-03-15 (×4): qty 1

## 2011-03-15 MED ORDER — NEPRO/CARBSTEADY PO LIQD: 237.0000 mL | ORAL | Status: DC | PRN

## 2011-03-15 MED ORDER — LIDOCAINE-PRILOCAINE 2.5-2.5 % EX CREA: 1.0000 "application " | TOPICAL_CREAM | CUTANEOUS | Status: DC | PRN

## 2011-03-15 MED ORDER — DARBEPOETIN ALFA-POLYSORBATE 60 MCG/0.3ML IJ SOLN
INTRAMUSCULAR | Status: AC
  Administered 2011-03-15: 60 ug via INTRAVENOUS
  Filled 2011-03-15: qty 0.3

## 2011-03-15 MED ORDER — INSULIN ASPART 100 UNIT/ML ~~LOC~~ SOLN
0.0000 [IU] | Freq: Every day | SUBCUTANEOUS | Status: DC
  Filled 2011-03-15: qty 3

## 2011-03-15 MED ORDER — PREDNISONE 50 MG PO TABS
60.0000 mg | ORAL_TABLET | Freq: Every day | ORAL | Status: DC
  Administered 2011-03-15: 60 mg via ORAL
  Filled 2011-03-15 (×2): qty 1

## 2011-03-15 NOTE — ED Notes (Signed)
Dialysis pt. Already used restroom earlier today and can't provide sample at this time.

## 2011-03-15 NOTE — ED Notes (Signed)
Patient presents with rash that started s/p using hibiclens prior to foot surgery. She states that she is having severe itching and burning associated with generalized rash. Benadryl pta with no relief. Pt went to her pcp dr. Vear Clock who didn't know what it was so she came here today.

## 2011-03-15 NOTE — ED Notes (Signed)
Patient had great right toe amputation completed and was discharged nov. 10. She states that prior to surgery they had her bathe in hibiclens and ever since then she has been developing a red, raised, dry, itching, hot rash all over her body. She has been taking benadryl po with no relief. Breath sounds are clear and bowel sounds are present. Pt went to pcp office of dr. Aneta Mins and she stated that he stated that he didn't know the cause of the rash. Protocols initiated upon arrival. Family at the bedside.

## 2011-03-15 NOTE — ED Notes (Signed)
Pt is alert and sts here with rash to bilateral groins and left lower back and arms.  Rash itches and hurts.  Rash is dark red and clustered.  Pt has elevated potassium and is on the monitor in NSR.  Pt has maturing graft in left upper arm that is positive bruit and thrill.  Pt has right upper chest clamped HD catheter.  Pt aware that she is awaiting admission

## 2011-03-15 NOTE — H&P (Signed)
PCP:   No primary provider on file.   Chief Complaint:  Purpuric rash for last 6 days  HPI: Patient is a 50 year old female with history of hypertension, anemia of chronic disease, diabetes mellitus insulin-dependent, end-stage renal disease on hemodialysis, osteomyelitis with recent right great toe amputation, left BKA presented to [facility] emergency room with purpuric rash. History was obtained by the patient who states that she initially developed the purpuric rash on her left high which progressively worsened in the last 6 days. Currently patient has a purpuric rash on both thighs extending into the stump of the left BKA and on the right leg, left flank/back area, left arm, right arm, non-blanchable and dark red/purplish. Patient described the rash as very itchy and has been taking Benadryl but no weeping discharge. No fevers or chills. Patient denies any new medications, any recent antibiotics, or exposure to any insect bites. Patient was not discharged on any Coumadin from the recent hospitalization. No prior history of any purpuric rash in the past.   Review of Systems:  Constitutional: Denies fever, chills, diaphoresis, appetite change and fatigue.  HEENT: Denies photophobia, eye pain, redness, hearing loss, ear pain, congestion, sore throat, rhinorrhea, sneezing, mouth sores, trouble swallowing, neck pain, neck stiffness and tinnitus.   Respiratory: Denies SOB, DOE, cough, chest tightness,  and wheezing.   Cardiovascular: Denies chest pain, palpitations and leg swelling.  Gastrointestinal: Denies nausea, vomiting, abdominal pain, diarrhea, constipation, blood in stool and abdominal distention.  Genitourinary: Denies dysuria, urgency, frequency, hematuria, flank pain and difficulty urinating.  Musculoskeletal: Denies myalgias, back pain, joint swelling, arthralgias and gait problem.  Skin: SEE HPI Neurological: Denies dizziness, seizures, syncope, weakness, light-headedness, numbness and  headaches.  Hematological: Denies adenopathy. Easy bruising, personal or family bleeding history  Psychiatric/Behavioral: Denies suicidal ideation, mood changes, confusion, nervousness, sleep disturbance and agitation  Past Medical History: Past Medical History  Diagnosis Date  . Hypertension   . Anemia   . Diabetes mellitus   . Renal failure    Past Surgical History  Procedure Date  . Leg amputation below knee     left  . Insertion of dialysis catheter   . Arteriovenous graft placement   . Below knee leg amputation   . Amputation 03/04/2011    Procedure: AMPUTATION DIGIT;  Surgeon: [name], MD;  Location: [facility] OR;  Service: Orthopedics;  Laterality: Right;  RT GREAT TOE AMP    Medications: Prior to Admission medications   Medication Sig Start Date End Date Taking? Authorizing Provider  bisoprolol-hydrochlorothiazide (ZIAC) 5-6.25 MG per tablet Take 1 tablet by mouth daily.    Yes Historical Provider, MD  calcium acetate, Phos Binder, (PHOSLYRA) 667 MG/5ML SOLN Take 1,334 mg by mouth 3 (three) times daily with meals.     Yes Historical Provider, MD  insulin aspart (NOVOLOG) 100 UNIT/ML injection Inject 8 Units into the skin daily.    Yes Historical Provider, MD  insulin glargine (LANTUS) 100 UNIT/ML injection Inject 12 Units into the skin at bedtime.    Yes Historical Provider, MD  oxyCODONE-acetaminophen (PERCOCET) 5-325 MG per tablet Take 1 tablet by mouth every 6 (six) hours as needed. For pain  03/05/11 03/15/11 Yes Gagan S Lama    Allergies:   Allergies  Allergen Reactions  . Codeine   . Levaquin   . Penicillins     Social History:  reports that she has never smoked. She does not have any smokeless tobacco history on file. She reports that she does  not drink alcohol or use illicit drugs.  Family History: History reviewed. No pertinent family history.  Physical Exam: Blood pressure 159/70, pulse 75, temperature 97.7 F (36.5 C), temperature source Oral, resp.  rate 14, SpO2 100.00%. General: Alert, awake, oriented x3, in no acute distress. HEENT: anicteric sclera, pink conjunctiva, pupils equal and reactive to light and accomodation Neck: supple, no masses or lymphadenopathy, no goiter, no bruits  Heart: Regular rate and rhythm, without murmurs, rubs or gallops. Lungs: Clear to auscultation bilaterally, no wheezing, rales or rhonchi. Abdomen: Soft, nontender, nondistended, positive bowel sounds, no masses. Extremities: Left BKA, right leg dressing intact Neuro: Grossly intact, no focal neurological deficits, strength 5/5 upper and lower extremities bilaterally Psych: alert and oriented x 3, normal mood and affect Skin: non-blanchable and dark red/purplish, purpuric rash on both thighs extending into the stump of the left BKA and on the right leg, left flank/back area, left arm, right arm, no weeping discharge    LABS on Admission:  Basic Metabolic Panel:  Lab 03/15/11 1610 03/15/11 0832  NA -- 131*  K 6.8* 6.7*  CL -- 92*  CO2 -- 21  GLUCOSE -- 110*  BUN -- 66*  CREATININE -- 8.75*  CALCIUM -- 8.1*  MG -- --  PHOS -- --   CBC:  Lab 03/15/11 0832  WBC 6.9  NEUTROABS --  HGB 10.9*  HCT 33.4*  MCV 86.1  PLT 179     Radiological Exams on Admission: Dg Toe Great Right  03/01/2011  *RADIOLOGY REPORT*  Clinical Data: Open wound.  RIGHT TOE - 2+ VIEW  Comparison: None  Findings: Soft tissue thickening and irregularity noted.  There is destructive bony change involving the distal tuft region of the distal phalanx suggesting osteomyelitis.  Joint spaces are maintained.  IMPRESSION: Plain film findings suspicious for osteomyelitis involving the distal tuft region of the distal phalanx.  Original Report Authenticated By: P. Loralie Champagne, M.D.    Assessment/Plan Present on Admission:   .Purpura fulminans: Unclear in etiology, platelets normal, patient has a history of end-stage renal disease, not discharged on any Coumadin or heparin  products during previous hospitalization. No fevers or chills.  - Admit to medicine service, I will obtain further workup including DIC panel, smear, hypercoagulable workup, ANCA, ESR, CRP, hepatic panel, lipid panel. - Placed on Benadryl, Pepcid, steroids, dermatology consult called for recommendation and possibly biopsy today. - Hold off on Percocet, ziac in her medications.   .Hyperkalemia: Needs hemodialysis, due today, renal consult has been called by the ED and myself.   .Diabetes mellitus: Obtain HbA1c, place on Lantus and sliding scale insulin   .HTN (hypertension): Place on Norvasc   .ESRD (end stage renal disease) on dialysis: On HD Tuesday, Thursday and Saturday, renal consult will be obtained for hemodialysis today   .Secondary hyperparathyroidism (of renal origin): Per renal   .Anemia of chronic disease: Per renal service, hemoglobin at baseline  .Diabetic foot ulcer/ Osteomyelitis: Status post recent right great toe amputation, place wound care consult  DVT prophylaxis: Hold off on any blood thinners for now due to Purpura rash, patient can have bilateral SCDs due to below-knee amputation on left and a dressing on the right.   @Time  Spent on Admission: 1 hour  Gennie Eisinger 03/15/2011, 11:52 AM

## 2011-03-15 NOTE — ED Notes (Signed)
Patient is resting comfortably. Waiting on lab results. Family is anxious and continues to stand at the doorway. Pt and family has been advised of plan of care.

## 2011-03-15 NOTE — Procedures (Signed)
4mm punch biopsy obtained from Left Upper arm under local anesthesia with 1 % Lidocaine, sutured with 5.0 nylon.  Dressing applied

## 2011-03-15 NOTE — ED Provider Notes (Signed)
History     CSN: [id number] Arrival date & time: 03/15/2011  7:45 AM   First MD Initiated Contact with Patient 03/15/11 [phone]      Chief Complaint  Patient presents with  . Rash    (Consider location/radiation/quality/duration/timing/severity/associated sxs/prior treatment) Patient is a 50 y.o. female presenting with rash. The history is provided by the patient.  Rash  This is a new problem. The current episode started more than 2 days ago. The problem has been gradually worsening. There has been no fever. The rash is present on the back, torso, right upper leg, right lower leg, right arm, left upper leg and left arm. The pain is mild. The pain has been constant since onset. Associated symptoms include itching and pain. Pertinent negatives include no blisters and no weeping. She has tried antihistamines for the symptoms. The treatment provided mild relief.  The patient was recently admitted to the hospital and underwent right great toe amputation secondary to osteomyelitis. Discharged on 11/10, rash began on lateral left thigh and then appeared on trunk, all extremities, back. She denies HA, fever, general feeing of being ill, known exposure to illness or insect bite. No new medications. Was on IV abx and heparin while admitted for recent surgery but no abx or blood thinner since discharge.  Past Medical History  Diagnosis Date  . Hypertension   . Anemia   . Diabetes mellitus   . Renal failure     Past Surgical History  Procedure Date  . Leg amputation below knee     left  . Insertion of dialysis catheter   . Arteriovenous graft placement   . Below knee leg amputation   . Amputation 03/04/2011    Procedure: AMPUTATION DIGIT;  Surgeon: [name], MD;  Location: [facility] OR;  Service: Orthopedics;  Laterality: Right;  RT GREAT TOE AMP    History reviewed. No pertinent family history.  History  Substance Use Topics  . Smoking status: Never Smoker   . Smokeless tobacco: Not on  file  . Alcohol Use: No    Review of Systems  Constitutional: Negative for fever and chills.  HENT: Negative for ear pain, facial swelling, neck pain, neck stiffness and tinnitus.   Eyes: Negative for pain and visual disturbance.  Respiratory: Negative for cough, chest tightness and shortness of breath.   Cardiovascular: Negative for chest pain, palpitations and leg swelling.  Gastrointestinal: Negative for nausea, vomiting and abdominal pain.  Genitourinary: Negative for hematuria and difficulty urinating.  Musculoskeletal: Negative for back pain and joint swelling.  Skin: Positive for color change, itching and rash. Negative for wound.  Neurological: Negative for dizziness, tremors, syncope, weakness, light-headedness, numbness and headaches.  Hematological: Does not bruise/bleed easily.  Psychiatric/Behavioral: Negative for behavioral problems and confusion.    Allergies  Codeine; Levaquin; and Penicillins  Home Medications   Current Outpatient Rx  Name Route Sig Dispense Refill  . BISOPROLOL-HYDROCHLOROTHIAZIDE 5-6.25 MG PO TABS Oral Take 1 tablet by mouth daily.     Marland Kitchen CALCIUM ACETATE (PHOS BINDER) 667 MG/5ML PO SOLN Oral Take 1,334 mg by mouth 3 (three) times daily with meals.      . INSULIN ASPART 100 UNIT/ML [place] SOLN Subcutaneous Inject 8 Units into the skin daily.     . INSULIN GLARGINE 100 UNIT/ML [place] SOLN Subcutaneous Inject 12 Units into the skin at bedtime.     . OXYCODONE-ACETAMINOPHEN 5-325 MG PO TABS Oral Take 1 tablet by mouth every 6 (six) hours as needed. For  pain       BP 161/80  Pulse 75  Temp(Src) 97.7 F (36.5 C) (Oral)  Resp 20  SpO2 98%  Physical Exam  Nursing note and vitals reviewed. Constitutional: She is oriented to person, place, and time. She appears well-developed and well-nourished. No distress.  HENT:  Head: Normocephalic and atraumatic.  Eyes: Pupils are equal, round, and reactive to light.  Neck: Normal range of motion. Neck supple.    Cardiovascular: Normal rate and regular rhythm.   No murmur heard. Pulmonary/Chest: Effort normal and breath sounds normal.  Abdominal: Soft. Bowel sounds are normal. She exhibits no distension. There is no tenderness.  Musculoskeletal:       Right lower leg with bandage in place, not undressed. Left BKA. All other joints with full ROM without tenderness or edema. No hot/swollen joint.  Lymphadenopathy:    She has no cervical adenopathy.  Neurological: She is alert and oriented to person, place, and time. No cranial nerve deficit.  Skin: Skin is warm and dry. Purpura and rash noted.       Pt has non-blanching purpuric rash to all extremities, sparing palms. Also present to left lower back in similar intensity. Several scattered purpuric spots to entire trunk in a less dense pattern. Painful to touch.     ED Course  Procedures (including critical care time)  Labs Reviewed  CBC - Abnormal; Notable for the following:    Hemoglobin 10.9 (*)    HCT 33.4 (*)    All other components within normal limits  BASIC METABOLIC PANEL - Abnormal; Notable for the following:    Sodium 131 (*)    Potassium 6.7 (*)    Chloride 92 (*)    Glucose, Bld 110 (*)    BUN 66 (*)    Creatinine, Ser 8.75 (*)    Calcium 8.1 (*)    GFR calc non Af Amer 5 (*)    GFR calc Af Amer 6 (*)    All other components within normal limits  APTT - Abnormal; Notable for the following:    aPTT 42 (*)    All other components within normal limits  POTASSIUM - Abnormal; Notable for the following:    Potassium 6.8 (*)    All other components within normal limits  PROTIME-INR  URINALYSIS, ROUTINE W REFLEX MICROSCOPIC   No results found.   1. Purpura fulminans   2. ESRD (end stage renal disease) on dialysis   3. Hyperkalemia       MDM  The patient with hypertension, diabetes, and end-stage renal failure on dialysis who presents with fulminant purpura of unknown cause. Although she was on heparin during her last  admission, her platelet count is normal and in line with her prior values; her PTT is slightly elevated at 42. She is to be admitted under the triad hospitalists for further evaluation of her rash; I have spoken with Dr. Lawana Pai regarding necessary dialysis.        Elwyn Reach [place], [place] 03/15/11 1234

## 2011-03-15 NOTE — Consult Note (Signed)
[facility] Renal Consultation Note  Indication for Consultation:  Management of ESRD/hemodialysis; anemia, hypertension/volume and secondary hyperparathyroidism  HPI: [name] is a 50 y.o. female amited with purpuric rash and hyperkalemia of 6.8 having missed hemodialysis sat." I thought my rash was contagious, went to my Primary Care Doc. Yesterday and he said come to the ER." Rash started on her left thigh Thursday after hd and spread to right leg, flank, left arm and right arm. Denies fever, chills, n/v/d,insect or tick bites. Denies any new meds or soaps. Recently at HD resting left upper arm AVF after infiltration with VVS apt. After Thanksgiving. Using rt ij perm cath without problems.  Dialysis Orders: Center: [facility] . EDW 95.0Bath 2.25ca,2.0kHeparin 4.8ccess Rt .ij perm cath    Zemplar 2.0g IV/HD Epogen 6,400units q hd Venofer  0 Other     Past Medical History  Diagnosis Date  . Hypertension   . Anemia   . Diabetes mellitus   . Renal failure     Past Surgical History  Procedure Date  . Leg amputation below knee     left  . Insertion of dialysis catheter   . Arteriovenous graft placement   . Below knee leg amputation   . Amputation 03/04/2011    Procedure: AMPUTATION DIGIT;  Surgeon: [name], MD;  Location: [facility] OR;  Service: Orthopedics;  Laterality: Right;  RT GREAT TOE AMP     History reviewed. No pertinent family history.    reports that she has never smoked. She does not have any smokeless tobacco history on file. She reports that she does not drink alcohol or use illicit drugs. Lives with husband at home and no  Children.   Allergies  Allergen Reactions  . Codeine   . Levaquin   . Penicillins     Prior to Admission medications   Medication Sig Start Date End Date Taking? Authorizing Provider  bisoprolol-hydrochlorothiazide (ZIAC) 5-6.25 MG per tablet Take 1 tablet by mouth daily.    Yes Historical Provider, MD  calcium acetate, Phos  Binder, (PHOSLYRA) 667 MG/5ML SOLN Take 1,334 mg by mouth 3 (three) times daily with meals.     Yes Historical Provider, MD  insulin aspart (NOVOLOG) 100 UNIT/ML injection Inject 8 Units into the skin daily.    Yes Historical Provider, MD  insulin glargine (LANTUS) 100 UNIT/ML injection Inject 12 Units into the skin at bedtime.    Yes Historical Provider, MD  oxyCODONE-acetaminophen (PERCOCET) 5-325 MG per tablet Take 1 tablet by mouth every 6 (six) hours as needed. For pain  03/05/11 03/15/11 Yes Meredeth Ide     I have reviewed the patient's current medications. Scheduled:   . darbepoetin (ARANESP) injection - DIALYSIS  60 mcg Intravenous Q Tue-HD  . famotidine (PEPCID) IV  20 mg Intravenous Q12H  . methylPREDNISolone (SOLU-MEDROL) injection  125 mg Intravenous Once  . paricalcitol  2 mcg Intravenous Q T,Th,Sa-HD  . DISCONTD: diphenhydrAMINE        Results for orders placed during the hospital encounter of 03/15/11 (from the past 48 hour(s))  CBC     Status: Abnormal   Collection Time   03/15/11  8:32 AM      Component Value Range Comment   WBC 6.9  4.0 - 10.5 (K/uL)    RBC 3.88  3.87 - 5.11 (MIL/uL)    Hemoglobin 10.9 (*) 12.0 - 15.0 (g/dL)    HCT 04.5 (*) 40.9 - 46.0 (%)    MCV 86.1  78.0 - 100.0 (fL)    MCH 28.1  26.0 - 34.0 (pg)    MCHC 32.6  30.0 - 36.0 (g/dL)    RDW 16.1  09.6 - 04.5 (%)    Platelets 179  150 - 400 (K/uL)   BASIC METABOLIC PANEL     Status: Abnormal   Collection Time   03/15/11  8:32 AM      Component Value Range Comment   Sodium 131 (*) 135 - 145 (mEq/L)    Potassium 6.7 (*) 3.5 - 5.1 (mEq/L)    Chloride 92 (*) 96 - 112 (mEq/L)    CO2 21  19 - 32 (mEq/L)    Glucose, Bld 110 (*) 70 - 99 (mg/dL)    BUN 66 (*) 6 - 23 (mg/dL)    Creatinine, Ser 4.09 (*) 0.50 - 1.10 (mg/dL)    Calcium 8.1 (*) 8.4 - 10.5 (mg/dL)    GFR calc non Af Amer 5 (*) >90 (mL/min)    GFR calc Af Amer 6 (*) >90 (mL/min)   PROTIME-INR     Status: Normal   Collection Time    03/15/11  8:32 AM      Component Value Range Comment   Prothrombin Time 14.7  11.6 - 15.2 (seconds)    INR 1.13  0.00 - 1.49    APTT     Status: Abnormal   Collection Time   03/15/11  8:32 AM      Component Value Range Comment   aPTT 42 (*) 24 - 37 (seconds)   POTASSIUM     Status: Abnormal   Collection Time   03/15/11  9:44 AM      Component Value Range Comment   Potassium 6.8 (*) 3.5 - 5.1 (mEq/L)    EKG: normal EKG, normal sinus rhythm, unchanged from previous tracings, frequent PVC's noted.  ROS: See above hpi and denies sob, pnd or chest pain since missing hd.     Physical Exam: Filed Vitals:   03/15/11 1350  BP:   Pulse:   Temp: 96.8 F (36 C)  Resp:      General:bp 153/73,67 pulse, o2 sat 98 rm air   Alert white female nad, pleasant HEENT:Bud, perrla,MMM, No throat lesions seen. Neck:supple, no jvd Heart:RRR Lungs:CTA bilat. Abdomen:Obese, bs =nl, soft, NT Extremities:Lft . Bka, RT. Leg dressing not removed. Skin:Rash dark red/purplish purpuric, nonblanchable on both thighs going to left bka stump,leftarm, left flank, right arm with no weeping or dc. Rt ij perm nontender and min. Erythema/no dc Neuro:alert , oriented times3 Dialysis Access:rt ij perm cath. And left upper arm avf pos. Bruit.  Assessment/Plan: 1ESRD with.Hyperkalemia sec. To missed hemo. Check am renal panel 2.Rash ?etiology= derm. Consult and wu per TH 3 Hypertension/volume Unable tostand for wt. Attempt 5-6 kg. Usual large wt [facility] at [facility].  4. Anemia  - Epo. Fu hgbs /no venofer. 5. Bones- zemplar on hd,binders . Fu Ca and phos. 6. HD. Access. Resting avf/ dw Dr. Arrie Aran about using avf. 7. PVD h/o left bka.  Lenny Pastel, PA-C [facility] Beeper [phone] 03/15/2011, 2:15 PM   I have seen and examined this patient and agree with the plan of care.  Pt with diffuse palpable, erythematous, nummular/pustular rash that is pruritic, and tender.  Agree with Derm consult.   Urgent HD for hyperkalemia and will use low K bath and repeat in am.  [name] A 03/15/2011, 4:20 PM

## 2011-03-15 NOTE — Procedures (Signed)
I was present at this session.  I have reviewed the session itself and made appropriate changes.  [name] A 11/20/20124:26 PM

## 2011-03-15 NOTE — Consult Note (Signed)
Subjective: 50 year old woman with diabetes, ESRD, HTN, s/p amputation of right great toe on 03/04/2011 admitted for 5 day hx of worsening rash on trunk and extremities.  Not associated with fever, chills or any other symptom  Objective:Vital signs in last 24 hours: Temp:  [96.8 F (36 C)-97.7 F (36.5 C)] 96.8 F (36 C) (11/20 1350) Pulse Rate:  [59-79] 70  (11/20 1800) Resp:  [14-20] 16  (11/20 1800) BP: (144-202)/(46-92) 165/80 mmHg (11/20 1800) SpO2:  [98 %-100 %] 98 % (11/20 1350)   Results for orders placed during the hospital encounter of 03/15/11 (from the past 24 hour(s))  CBC     Status: Abnormal   Collection Time   03/15/11  8:32 AM      Component Value Range   WBC 6.9  4.0 - 10.5 (K/uL)   RBC 3.88  3.87 - 5.11 (MIL/uL)   Hemoglobin 10.9 (*) 12.0 - 15.0 (g/dL)   HCT 40.9 (*) 81.1 - 46.0 (%)   MCV 86.1  78.0 - 100.0 (fL)   MCH 28.1  26.0 - 34.0 (pg)   MCHC 32.6  30.0 - 36.0 (g/dL)   RDW 91.4  78.2 - 95.6 (%)   Platelets 179  150 - 400 (K/uL)  BASIC METABOLIC PANEL     Status: Abnormal   Collection Time   03/15/11  8:32 AM      Component Value Range   Sodium 131 (*) 135 - 145 (mEq/L)   Potassium 6.7 (*) 3.5 - 5.1 (mEq/L)   Chloride 92 (*) 96 - 112 (mEq/L)   CO2 21  19 - 32 (mEq/L)   Glucose, Bld 110 (*) 70 - 99 (mg/dL)   BUN 66 (*) 6 - 23 (mg/dL)   Creatinine, Ser 2.13 (*) 0.50 - 1.10 (mg/dL)   Calcium 8.1 (*) 8.4 - 10.5 (mg/dL)   GFR calc non Af Amer 5 (*) >90 (mL/min)   GFR calc Af Amer 6 (*) >90 (mL/min)  PROTIME-INR     Status: Normal   Collection Time   03/15/11  8:32 AM      Component Value Range   Prothrombin Time 14.7  11.6 - 15.2 (seconds)   INR 1.13  0.00 - 1.49   APTT     Status: Abnormal   Collection Time   03/15/11  8:32 AM      Component Value Range   aPTT 42 (*) 24 - 37 (seconds)  POTASSIUM     Status: Abnormal   Collection Time   03/15/11  9:44 AM      Component Value Range   Potassium 6.8 (*) 3.5 - 5.1 (mEq/L)   Exam:   Purpuric  papules in some areas confluent to plaques scattered on upper and lower ext, buttocks and lower back, few lesions almost pustular.  Studies/Results: Dg Toe Great Right  03/01/2011  *RADIOLOGY REPORT*  Clinical Data: Open wound.  RIGHT TOE - 2+ VIEW  Comparison: None  Findings: Soft tissue thickening and irregularity noted.  There is destructive bony change involving the distal tuft region of the distal phalanx suggesting osteomyelitis.  Joint spaces are maintained.  IMPRESSION: Plain film findings suspicious for osteomyelitis involving the distal tuft region of the distal phalanx.  Original Report Authenticated By: P. Loralie Champagne, M.D.    Scheduled Meds:   . darbepoetin (ARANESP) injection - DIALYSIS  60 mcg Intravenous Q Tue-HD  . famotidine (PEPCID) IV  20 mg Intravenous Q12H  . methylPREDNISolone (SOLU-MEDROL) injection  125 mg Intravenous  Once  . paricalcitol  2 mcg Intravenous Q T,Th,Sa-HD  . DISCONTD: diphenhydrAMINE       Continuous Infusions:  PRN Meds:sodium chloride, sodium chloride, acetaminophen, alteplase, camphor-menthol, diphenhydrAMINE, docusate sodium, feeding supplement (NEPRO CARB STEADY), feeding supplement (NEPRO CARB STEADY), heparin, heparin, HYDROmorphone, hydrOXYzine, lidocaine, lidocaine-prilocaine, ondansetron (ZOFRAN) IV, ondansetron, oxyCODONE, pentafluoroprop-tetrafluoroeth, promethazine, promethazine, senna-docusate, sorbitol, zolpidem, DISCONTD: acetaminophen DISCONTD: acetaminophen, DISCONTD: heparin, DISCONTD: heparin  Assessment/Plan:  Leukocytoclastic Vasculitis Clinical presentation c/w LCV which can be secondary to meds, viral illnesses, internal diseases, malignancy, etc. In this clinical context I would be concerned about medication reaction given her lack of fever and normal WBC.  I obtained a 4mm punch biopsy under local anesthesia with 1 percent Lidocaine from her left upper arm.  Results should be available within 24-48 hrs.  Agree with plan to do  more extensive lab workup and would consider tapering course of corticosteroids.  Please call us at [phone] with any questions.    LOS: 0 days   Bonnie Amis, MD 03/15/2011 7:11 PM

## 2011-03-15 NOTE — ED Notes (Addendum)
Velna Hatchet from Lab called to notify this RN K=6.8 specimen not hemolized will notify attending and primary RN. Artis Flock, PA-C aware

## 2011-03-15 NOTE — ED Provider Notes (Signed)
History/physical exam/procedure(s) were performed by non-physician practitioner and as supervising physician I was immediately available for consultation/collaboration. I have reviewed all notes and am in agreement with care and plan.   Hilario Quarry, MD 03/15/11 [phone]

## 2011-03-16 DIAGNOSIS — B999 Unspecified infectious disease: Secondary | ICD-10-CM

## 2011-03-16 DIAGNOSIS — A419 Sepsis, unspecified organism: Secondary | ICD-10-CM

## 2011-03-16 DIAGNOSIS — B379 Candidiasis, unspecified: Secondary | ICD-10-CM

## 2011-03-16 DIAGNOSIS — N390 Urinary tract infection, site not specified: Secondary | ICD-10-CM

## 2011-03-16 LAB — RENAL FUNCTION PANEL
CO2: 21 mEq/L (ref 19–32)
Calcium: 8.8 mg/dL (ref 8.4–10.5)
Chloride: 95 mEq/L — ABNORMAL LOW (ref 96–112)
GFR calc Af Amer: 10 mL/min — ABNORMAL LOW (ref >90)
Glucose, Bld: 222 mg/dL — ABNORMAL HIGH (ref 70–99)
Potassium: 4.8 mEq/L (ref 3.5–5.1)
Sodium: 133 mEq/L — ABNORMAL LOW (ref 135–145)

## 2011-03-16 LAB — GLUCOSE, CAPILLARY
Glucose-Capillary: 274 mg/dL — ABNORMAL HIGH (ref 70–99)
Glucose-Capillary: 210 mg/dL — ABNORMAL HIGH (ref 70–99)

## 2011-03-16 LAB — CBC
Hemoglobin: 11.1 g/dL — ABNORMAL LOW (ref 12.0–15.0)
MCH: 28.2 pg (ref 26.0–34.0)
RBC: 3.94 MIL/uL (ref 3.87–5.11)
WBC: 8.8 10*3/uL (ref 4.0–10.5)

## 2011-03-16 LAB — URINE MICROSCOPIC-ADD ON

## 2011-03-16 LAB — URINALYSIS, ROUTINE W REFLEX MICROSCOPIC
Ketones, ur: 40 mg/dL — AB
Nitrite: POSITIVE — AB
Specific Gravity, Urine: 1.029 (ref 1.005–1.030)
Urobilinogen, UA: 0.2 mg/dL (ref 0.0–1.0)

## 2011-03-16 LAB — HEMOGLOBIN A1C: Mean Plasma Glucose: 128 mg/dL — ABNORMAL HIGH (ref <117)

## 2011-03-16 MED ORDER — SODIUM CHLORIDE 0.9 % IJ SOLN
3.0000 mL | Freq: Two times a day (BID) | INTRAMUSCULAR | Status: DC
  Administered 2011-03-16 – 2011-03-21 (×9): 3 mL via INTRAVENOUS

## 2011-03-16 MED ORDER — FENTANYL CITRATE 0.05 MG/ML IJ SOLN: 250.0000 ug | Freq: Once | INTRAMUSCULAR | Status: DC

## 2011-03-16 MED ORDER — HEPARIN SODIUM (PORCINE) 5000 UNIT/ML IJ SOLN
5000.0000 [IU] | Freq: Three times a day (TID) | INTRAMUSCULAR | Status: DC
  Administered 2011-03-16 – 2011-03-17 (×3): 5000 [IU] via SUBCUTANEOUS
  Filled 2011-03-16 (×6): qty 1

## 2011-03-16 MED ORDER — MIDAZOLAM HCL 10 MG/2ML IJ SOLN: 10.0000 mg | Freq: Once | INTRAMUSCULAR | Status: DC

## 2011-03-16 MED ORDER — INSULIN ASPART 100 UNIT/ML ~~LOC~~ SOLN
0.0000 [IU] | Freq: Three times a day (TID) | SUBCUTANEOUS | Status: DC
  Administered 2011-03-16: 7 [IU] via SUBCUTANEOUS
  Administered 2011-03-17: 4 [IU] via SUBCUTANEOUS
  Administered 2011-03-17: 3 [IU] via SUBCUTANEOUS
  Administered 2011-03-19: 7 [IU] via SUBCUTANEOUS
  Administered 2011-03-20: 3 [IU] via SUBCUTANEOUS
  Administered 2011-03-20: 4 [IU] via SUBCUTANEOUS
  Administered 2011-03-20: 3 [IU] via SUBCUTANEOUS
  Administered 2011-03-21: 11 [IU] via SUBCUTANEOUS

## 2011-03-16 MED ORDER — SODIUM CHLORIDE 0.9 % IV SOLN
250.0000 mL | INTRAVENOUS | Status: DC
  Administered 2011-03-18: 250 mL via INTRAVENOUS

## 2011-03-16 MED ORDER — SODIUM CHLORIDE 0.9 % IV SOLN
100.0000 mg | INTRAVENOUS | Status: AC
  Administered 2011-03-16: 100 mg via INTRAVENOUS
  Filled 2011-03-16: qty 100

## 2011-03-16 MED ORDER — SODIUM CHLORIDE 0.9 % IV SOLN
100.0000 mg | INTRAVENOUS | Status: DC
  Administered 2011-03-17 – 2011-03-20 (×4): 100 mg via INTRAVENOUS
  Filled 2011-03-16 (×5): qty 100

## 2011-03-16 MED ORDER — SODIUM CHLORIDE 0.45 % IV SOLN: INTRAVENOUS | Status: DC

## 2011-03-16 MED ORDER — INSULIN GLARGINE 100 UNIT/ML ~~LOC~~ SOLN
15.0000 [IU] | Freq: Every day | SUBCUTANEOUS | Status: DC
  Administered 2011-03-16 – 2011-03-18 (×3): 15 [IU] via SUBCUTANEOUS
  Filled 2011-03-16: qty 3

## 2011-03-16 MED ORDER — INSULIN ASPART 100 UNIT/ML ~~LOC~~ SOLN
0.0000 [IU] | Freq: Every day | SUBCUTANEOUS | Status: DC
  Filled 2011-03-16: qty 3

## 2011-03-16 MED ORDER — FAMOTIDINE IN NACL 20-0.9 MG/50ML-% IV SOLN
20.0000 mg | INTRAVENOUS | Status: DC
  Administered 2011-03-17 – 2011-03-21 (×5): 20 mg via INTRAVENOUS
  Filled 2011-03-16 (×5): qty 50

## 2011-03-16 MED ORDER — BENZOCAINE 20 % MT SOLN
1.0000 "application " | OROMUCOSAL | Status: DC | PRN
  Filled 2011-03-16: qty 57

## 2011-03-16 MED ORDER — SODIUM CHLORIDE 0.9 % IJ SOLN: 3.0000 mL | INTRAMUSCULAR | Status: DC | PRN

## 2011-03-16 NOTE — Progress Notes (Signed)
Occupational Therapy   Patient Details Name: [name] MRN: [id number] DOB: 06-Nov-1960 Today's Date: 03/16/2011  Problem List:  Patient Active Problem List  Diagnoses  . Diabetic foot ulcer  . Diabetes mellitus  . HTN (hypertension)  . Anemia  . ESRD (end stage renal disease) on dialysis  . Obesities, morbid  . Osteomyelitis  . Secondary hyperparathyroidism (of renal origin)  . Anemia of chronic disease  . Complication of vascular access for dialysis  . Purpura  . Hyperkalemia    Past Medical History:  Past Medical History  Diagnosis Date  . Hypertension   . Anemia   . Diabetes mellitus   . Renal failure   . Peripheral vascular disease   . Hemodialysis patient 03/15/11    "Tues; Thurs; Sat; [facility]"  . Blood transfusion   . Arthritis     right knee  . Chronic back pain   . Rash 03/15/11    "admitted me to find out what its' from; UE, waist, thighs, groin"   Past Surgical History:  Past Surgical History  Procedure Date  . Insertion of dialysis catheter 07/29/10    right subclavian  . Arteriovenous graft placement 10/01/10    left upper arm  . Below knee leg amputation 2004    left  . Amputation 03/04/2011    Procedure: AMPUTATION DIGIT;  Surgeon: [name], MD;  Location: [facility] OR;  Service: Orthopedics;  Laterality: Right;  RT GREAT TOE AMP  . Cataract extraction w/ intraocular lens implant 12/2009    right eye  . Carpal tunnel release ~ 2009    left wrist      OT reviewed chart and spoke with patient.  Patient is adamant that she does not need therapy and is completely independent at home.    Pt. Feels good about home setup and support at home and politely and firmly refuses OT.   OT to sign off.  Please re-order if necessary.    OT Evaluation Precautions/Restrictions  Restrictions Weight Bearing Restrictions: No     03/16/2011 [name] [name], MS, OTR/L Occupational Therapist Acute Rehabilitation [facility] Phone: [phone] Pager: [phone] [name]      Cognition Orientation Level: Oriented X4   [name] [name] 03/16/2011, 12:48 PM

## 2011-03-16 NOTE — Progress Notes (Addendum)
Subjective: States the rash is "drying up", still itchy, no wet discharge. Called by pharmacy that blood cultures positive for yeast.  Objective: Weight change:   Intake/Output Summary (Last 24 hours) at 03/16/11 1240 Last data filed at 03/16/11 1100  Gross per 24 hour  Intake   1250 ml  Output   6028 ml  Net  -4778 ml   Blood pressure 151/79, pulse 82, temperature 98.6 F (37 C), temperature source Oral, resp. rate 20, height 5\' 3"  (1.6 m), weight 87 kg (191 lb 12.8 oz), SpO2 100.00%.  Physical Exam: General: Alert and awake, oriented x3, not in any acute distress. HEENT: anicteric sclera, pupils reactive to light and accommodation, EOMI CVS: S1-S2 clear, no murmur rubs or gallops Chest: clear to auscultation bilaterally, no wheezing, rales or rhonchi Abdomen: soft nontender, nondistended, normal bowel sounds, no organomegaly Extremities: Left BKA Skin: non-blanchable and dark red/purplish, purpuric rash on both thighs extending into the stump of the left BKA and on the right leg, left flank/back area, left arm, right arm, no weeping discharge    Lab Results: Basic Metabolic Panel:  Lab 03/16/11 0454 03/15/11 0944 03/15/11 0832  NA 133* -- 131*  K 4.8 6.8* --  CL 95* -- 92*  CO2 21 -- 21  GLUCOSE 222* -- 110*  BUN 36* -- 66*  CREATININE 5.28* -- 8.75*  CALCIUM 8.8 -- 8.1*  MG -- -- --  PHOS 7.7* -- --   Liver Function Tests:  Lab 03/16/11 0630 03/15/11 2153  AST -- 18  ALT -- 11  ALKPHOS -- 299*  BILITOT -- 1.3*  PROT -- 9.8*  ALBUMIN 3.3* 4.0   No results found for this basename: LIPASE:2,AMYLASE:2 in the last 168 hours No results found for this basename: AMMONIA:2 in the last 168 hours CBC:  Lab 03/16/11 0500 03/15/11 2153 03/15/11 0832  WBC 8.8 -- 6.9  NEUTROABS -- -- --  HGB 11.1* -- 10.9*  HCT 34.1* -- 33.4*  MCV 86.5 -- 86.1  PLT 170 182 --   Cardiac Enzymes: No results found for this basename: CKTOTAL:3,CKMB:3,CKMBINDEX:3,TROPONINI:3 in the last  168 hours BNP: No results found for this basename: POCBNP:2 in the last 168 hours CBG:  Lab 03/16/11 1119 03/16/11 0758 03/15/11 2120  GLUCAP 329* 274* 134*     Micro Results: Recent Results (from the past 240 hour(s))  CULTURE, BLOOD (ROUTINE X 2)     Status: Normal (Preliminary result)   Collection Time   03/15/11  2:30 PM      Component Value Range Status Comment   Specimen Description BLOOD   Final    Special Requests     Final    Value: BOTTLES DRAWN AEROBIC AND ANAEROBIC 10CC RIGHT IJ CATHETER   Setup Time [id number]   Final    Culture     Final    Value: YEAST     Note: Gram Stain Report Called to,Read Back By and Verified With: JENAE MALONE @ 1030 03/16/11 WICKN   Report Status PENDING   Incomplete   CULTURE, BLOOD (ROUTINE X 2)     Status: Normal (Preliminary result)   Collection Time   03/15/11  3:20 PM      Component Value Range Status Comment   Specimen Description BLOOD HEMODIALYSIS CATHETER   Final    Special Requests BOTTLES DRAWN AEROBIC AND ANAEROBIC 10CC   Final    Setup Time [id number]   Final    Culture     Final  Value:        BLOOD CULTURE RECEIVED NO GROWTH TO DATE CULTURE WILL BE HELD FOR 5 DAYS BEFORE ISSUING A FINAL NEGATIVE REPORT   Report Status PENDING   Incomplete   PATHOLOGIST SMEAR REVIEW     Status: Normal   Collection Time   03/15/11  9:53 PM      Component Value Range Status Comment   Tech Review     Final    Value: MILD ABSOLUTE NEUTROPHILIC LEUKOCYTOSIS WITH NO LEFT SHIFT.    Studies/Results: Dg Toe Great Right  03/01/2011  *RADIOLOGY REPORT*  Clinical Data: Open wound.  RIGHT TOE - 2+ VIEW  Comparison: None  Findings: Soft tissue thickening and irregularity noted.  There is destructive bony change involving the distal tuft region of the distal phalanx suggesting osteomyelitis.  Joint spaces are maintained.  IMPRESSION: Plain film findings suspicious for osteomyelitis involving the distal tuft region of the distal phalanx.   Original Report Authenticated By: P. Loralie Champagne, M.D.    Medications: Scheduled Meds:   . amLODipine  5 mg Oral Daily  . darbepoetin (ARANESP) injection - DIALYSIS  60 mcg Intravenous Q Tue-HD  . famotidine (PEPCID) IV  20 mg Intravenous Q12H  . famotidine (PEPCID) IV  20 mg Intravenous Q12H  . insulin aspart  0-5 Units Subcutaneous QHS  . insulin aspart  0-9 Units Subcutaneous TID WC  . insulin glargine  12 Units Subcutaneous QHS  . methylPREDNISolone (SOLU-MEDROL) injection  125 mg Intravenous Once  . micafungin (MYCAMINE) IV  100 mg Intravenous STAT  . micafungin (MYCAMINE) IV  100 mg Intravenous Q24H  . paricalcitol  2 mcg Intravenous Q T,Th,Sa-HD  . predniSONE  60 mg Oral QAC breakfast  . DISCONTD: diphenhydrAMINE       Continuous Infusions:  PRN Meds:.sodium chloride, sodium chloride, acetaminophen, alteplase, camphor-menthol, diphenhydrAMINE, diphenhydrAMINE, docusate sodium, feeding supplement (NEPRO CARB STEADY), heparin, heparin, HYDROmorphone, hydrOXYzine, lidocaine, lidocaine-prilocaine, ondansetron (ZOFRAN) IV, ondansetron, oxyCODONE, pentafluoroprop-tetrafluoroeth, promethazine, promethazine, senna-docusate, sorbitol, zolpidem, DISCONTD: acetaminophen, DISCONTD: acetaminophen DISCONTD: feeding supplement (NEPRO CARB STEADY), DISCONTD: heparin, DISCONTD: heparin  Assessment/Plan:  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 D-dimer elevated, low probability for PE given no hypoxia/tachycardia  - Continue Benadryl, Pepcid, steroids,  - Rule out any endocarditis causing leukocytoclastic vasculitis, given now the blood culture is positive for yeast  Yeast bacteremia: One out of two - Repeat 2 sets of blood cultures, started on micafungin, ID consult obtained (discussed with Dr. Ilsa Iha) [facility]  cardiology consultation for TEE to rule out endocarditis - Hemodialysis cath may need to be changed, nephrology aware of the blood cultures positive per Dr. Elza Rafter note, defer to renal for  decision   UTI: patient has allergies to Levaquin and penicillin -Urine culture pending, ID consult has been obtained, will defer to ID for antibiotic choice   .Hyperkalemia: Resolved after the hemodialysis  .Diabetes mellitus: Obtain HbA1c - Hyperglycemia secondary to steroids, increase Lantus to 15 units, change sliding scale insulin to moderate today   .HTN (hypertension): Increase Norvasc to 10 mg daily   .ESRD (end stage renal disease) on dialysis: On HD Tuesday, Thursday and Saturday, renal following   .Secondary hyperparathyroidism (of renal origin): Per renal   .Anemia of chronic disease: Per renal service, hemoglobin at baseline   .Diabetic foot ulcer/ Osteomyelitis: Status post recent right great toe amputation  DVT prophylaxis: Will restart  heparin subcutaneous for DVT prophylaxis continue to monitor platelets.    LOS: 1 day   [name] 03/16/2011, 12:40 PM   Discussed with Dr. Yetta Barre (Dermatology), recommended holding prednisone and continuing work-up of systemic cause of vasculitis. Dr. Yetta Barre will relay the biopsy results to me, should be back in next 24-48hours.   Khloei Spiker 03/16/2011, 8:41 PM

## 2011-03-16 NOTE — Progress Notes (Signed)
Pt chart reviewed, TEE is indicated to r/o endocarditis. It has been ordered for Friday, 11-23. Time pending. Orders written. Call if further assistance needed. [employer] [facility] [phone] or [place] [phone].

## 2011-03-16 NOTE — Consult Note (Signed)
INFECTIOUS DISEASES CONSULTATION  Reason for Consult: candidemia and rash Referring Physician:   [name] is an 50 y.o. female. IDDM, ESRD on HD, hx of HTN,  Left BKA, anemia of chronic disease, recent ampuaton of right great toe amputation on 11/06 for MSSA osteomyelitis. She presents to the ED on 11/20 for new onset of purpuric rash initially starting on left thigh and progressing over the next 6 days mainly to extremities, but spares chest and back. She states that it was never vesicular in nature, but it is pruritic. The rash is on both thighs extending into the stump of the left BKA and on the right leg, left flank/back area, left arm, right arm, non-blanchable and dark red/purplish. She has been taking Benadryl but no weeping discharge. She denies any new medications, no antibiotics given on her last admit when her great toe amputation occurred. No fevers or chills. She states that her HD line on right chest wall has not been erythamatous, she did  Have remote history of soap sensitivity when the HD line was placed back in April.  Patient was not discharged on any Coumadin from the recent hospitalization. No prior history of any purpuric rash in the past. Since being admitted she was started on prednisone and h2 blockers thinking this was drug allergy related. the patient was evaluated by dermatology who felt this was consistent with leukocytoclastic vasculitis. She underwent a skin biopsy in left upper arm, path pending. Her blood cultures were noted to have yeast, thus she was started on micafungin.  ABTX: micafungin #1  Allergies  Allergen Reactions  . Codeine   . Levaquin   . Penicillins     Past Medical History  Diagnosis Date  . Hypertension   . Anemia   . Diabetes mellitus   . Renal failure   . Peripheral vascular disease   . Hemodialysis patient 03/15/11    "Tues; Thurs; Sat; [facility]"  . Blood transfusion   . Arthritis     right knee  . Chronic  back pain   . Rash 03/15/11    "admitted me to find out what its' from; UE, waist, thighs, groin"    Past Surgical History  Procedure Date  . Insertion of dialysis catheter 07/29/10    right subclavian  . Arteriovenous graft placement 10/01/10    left upper arm  . Below knee leg amputation 2004    left  . Amputation 03/04/2011    Procedure: AMPUTATION DIGIT;  Surgeon: [name], MD;  Location: [facility] OR;  Service: Orthopedics;  Laterality: Right;  RT GREAT TOE AMP  . Cataract extraction w/ intraocular lens implant 12/2009    right eye  . Carpal tunnel release ~ 2009    left wrist    History reviewed. No pertinent family history.  Social History:  reports that she has never smoked. She has never used smokeless tobacco. She reports that she does not drink alcohol or use illicit drugs. currently on disability, previously worked as a Arboriculturist   Review of Systems  Constitutional: Negative for fever, chills and malaise/fatigue.  HENT: Negative for congestion, sore throat and neck pain.   Eyes: Negative for blurred vision, photophobia, pain and discharge.  Respiratory: Negative for cough, sputum production, shortness of breath, wheezing and stridor.   Cardiovascular: Negative for palpitations, claudication and leg swelling.  Gastrointestinal: Negative for nausea, vomiting, abdominal pain and diarrhea.  Genitourinary: Negative for urgency and frequency.  Musculoskeletal: Negative for myalgias and  back pain.  Skin: Positive for itching and rash.  Neurological: Negative for dizziness and headaches.  Endo/Heme/Allergies: Does not bruise/bleed easily.  Psychiatric/Behavioral: Negative.    Blood pressure 148/70, pulse 68, temperature 98.6 F (37 C), temperature source Oral, resp. rate 20, height 5\' 3"  (1.6 m), weight 87 kg (191 lb 12.8 oz), SpO2 98.00%. Physical Exam  Constitutional: She is oriented to person, place, and time. She appears well-developed. No distress.  HENT:  Head:  Normocephalic and atraumatic.  Right Ear: External ear normal.  Left Ear: External ear normal.  Mouth/Throat: Oropharynx is clear and moist. No oropharyngeal exudate.  Eyes: Conjunctivae and EOM are normal. Pupils are equal, round, and reactive to light. Right eye exhibits no discharge. Left eye exhibits no discharge. No scleral icterus.  Neck: No JVD present. No thyromegaly present.  Cardiovascular: Normal rate, regular rhythm and normal heart sounds.  Exam reveals no gallop and no friction rub.   No murmur heard. Respiratory: Effort normal and breath sounds normal. No respiratory distress. She has no wheezes. She has no rales. She exhibits no tenderness.  GI: Soft. Bowel sounds are normal. She exhibits no distension. There is no tenderness. There is no guarding.  Musculoskeletal: Normal range of motion. She exhibits no edema and no tenderness.  Lymphadenopathy:    She has no cervical adenopathy.  Neurological: She is alert and oriented to person, place, and time. She has normal reflexes. No cranial nerve deficit. Coordination normal.  Skin: Skin is warm and dry. Rash noted. She is not diaphoretic. There is erythema.       Significant purpuric palpable ,non-blanching rash that has areas of collalescing on bilateral flanks and on left upper thigh. It spares her right arm, chest, and back. No areas that look like they are ulcerating.     Labs: CBC    Component Value Date/Time   WBC 8.8 03/16/2011 0500   RBC 3.94 03/16/2011 0500   HGB 11.1* 03/16/2011 0500   HCT 34.1* 03/16/2011 0500   PLT 170 03/16/2011 0500   MCV 86.5 03/16/2011 0500   MCH 28.2 03/16/2011 0500   MCHC 32.6 03/16/2011 0500   RDW 15.0 03/16/2011 0500   LYMPHSABS 1.6 03/01/2011 1318   MONOABS 0.4 03/01/2011 1318   EOSABS 0.2 03/01/2011 1318   BASOSABS 0.0 03/01/2011 1318    CMP     Component Value Date/Time   NA 133* 03/16/2011 0630   K 4.8 03/16/2011 0630   CL 95* 03/16/2011 0630   CO2 21 03/16/2011 0630    GLUCOSE 222* 03/16/2011 0630   BUN 36* 03/16/2011 0630   CREATININE 5.28* 03/16/2011 0630   CALCIUM 8.8 03/16/2011 0630   CALCIUM 8.0* 03/02/2009 1350   PROT 9.8* 03/15/2011 2153   ALBUMIN 3.3* 03/16/2011 0630   AST 18 03/15/2011 2153   ALT 11 03/15/2011 2153   ALKPHOS 299* 03/15/2011 2153   BILITOT 1.3* 03/15/2011 2153   GFRNONAA 9* 03/16/2011 0630   GFRAA 10* 03/16/2011 0630   Micro: 11/20 blood cx 1 of 2 sets + yeast, ID pending 11/21 urine cx pending 11/21 blood cx pending- NGTD  Assessment/Plan: 50 yo F with HD line but also has immature Left arm AV fistula presents with leukocytoclastic vasculitis and candidemia.  1. Will recommend to continue with micafungin for now. Await ID of yeast. Will need at least 14 day course of therapy. Will need to determine if patient has endocarditis, please have patient get TEE. Will need to assume that her HD line  is infected, thus require removal. Would consider getting AVG ultrasound to ensure there is no clot as that would be another possible nidus of infection.  Patient denies having any blurry vision. If she does complain of any visual difficulties, would have low threshold to have ophthomalogy rule out fungal endophthalmitis.  2. LCV = would consider other work-up including getting HIV, HCV and HBV testing. Would also recommend to doing auto-immune work-up for SLE, also check C-anca, P-anca.  3. Right big toe amputation = please have wound care RN take down her original dressing to see how her leg appears post surgery.  We will continue to follow patient during her hospitalization. Dr. Daiva Eves to see her over the next 4 days.  Judyann Munson 03/16/2011, 10:47 PM

## 2011-03-16 NOTE — Progress Notes (Signed)
Subjective: Pruritic rash over trunk and extremities; otherwise, no complaints, breathing well s/p HD yesterday.  Objective: Vital signs in last 24 hours: Temp:  [96.8 F (36 C)-98.4 F (36.9 C)] 98.4 F (36.9 C) (11/21 0536) Pulse Rate:  [59-83] 83  (11/21 0536) Resp:  [14-19] 18  (11/21 0536) BP: (144-202)/(46-97) 170/78 mmHg (11/21 0536) SpO2:  [95 %-100 %] 95 % (11/21 0536) Weight:  [87 kg (191 lb 12.8 oz)] 191 lb 12.8 oz (87 kg) (11/20 2125) Weight change:   Intake/Output from previous day: 11/20 0701 - 11/21 0700 In: 530 [P.O.:480; IV Piggyback:50] Out: 6026 [Urine:100]   EXAM: General appearance:  Alert, in no apparent distress Resp:  CTA B  Cardio:  RRR without murmur   GI:  + BS, soft and nontender Extremities:  No edema, left BKA; diffuse purpuric rash, some lesions with papules and pustular, over extremities  Access:  Right IJ catheter, AVF @ LUA with + bruit  Lab Results:  Basename 03/16/11 0500 03/15/11 2153 03/15/11 0832  WBC 8.8 -- 6.9  HGB 11.1* -- 10.9*  HCT 34.1* -- 33.4*  PLT 170 182 --   BMET:  Basename 03/16/11 0630 03/15/11 2153 03/15/11 0944 03/15/11 0832  NA 133* -- -- 131*  K 4.8 -- 6.8* --  CL 95* -- -- 92*  CO2 21 -- -- 21  GLUCOSE 222* -- -- 110*  BUN 36* -- -- 66*  CREATININE 5.28* -- -- 8.75*  CALCIUM 8.8 -- -- 8.1*  ALBUMIN 3.3* 4.0 -- --   No results found for this basename: PTH:2 in the last 72 hours Iron Studies: No results found for this basename: IRON,TIBC,TRANSFERRIN,FERRITIN in the last 72 hours  Assessment/Plan: 1.  Rash - seen by dermatology ( Dr. Yetta Barre), presentation consistent with leukocytoclastic vasculitis, possibly secondary to meds, virus, malignancy, etc.  Results of punch biopsy pending. 2.  ESRD - on HD on TTS, but missed treatment on 11/17 secondary to rash, which thought may be contagious, had HD yesterday at [facility], currently stable.  3.  Hyperkalemia - K of 6.8 yesterday secondary to missed HD, now 4.8 s/p 1K  bath. 4.  Hypertension/Volume - BP slightly high on Amlodipine 5 mg qd, s/p HD yesterday with 5-6 L removed. 5.  Secondary hyperparathyroidism - on Zemplar 2 mcg per HD and Phoslo with meals. 6.  Anemia - Hgb stable @ 11.1 s/p Aranesp 60 mcg yesterday with HD. 7.  DM - on Insulin.   8.  PVD - s/p left BKA.       LOS: 1 day   LYLES,CHARLES 03/16/2011,8:15 AM   I have seen and examined this patient and agree assessment.  Punch biopsy done yesterday is pending.  Rash remains palpable, tender and pruritic. She is afebrile, with normal white count. Patient had dialysis yesterday - is convinced that she is going home tomorrow - if she does, she has, on her own, arranged for outpatient dialysis at [facility] on Friday and then again on Saturday (no dialysis here tomorrow unless emergent due to Thanksgiving). Romy Ipock B,MD 03/16/2011 11:01 AM

## 2011-03-16 NOTE — Progress Notes (Signed)
ANTIBIOTIC CONSULT NOTE - INITIAL  Pharmacy Consult for Micafungin Indication: Fungemia  Allergies  Allergen Reactions  . Codeine   . Levaquin   . Penicillins     Patient Measurements: Height: 5\' 3"  (160 cm) Weight: 191 lb 12.8 oz (87 kg) IBW/kg (Calculated) : 52.4  Adjusted Body Weight:   Vital Signs: Temp: 98.6 F (37 C) (11/21 1000) Temp src: Oral (11/21 1000) BP: 151/79 mmHg (11/21 1000) Pulse Rate: 82  (11/21 1000) Intake/Output from previous day: 11/20 0701 - 11/21 0700 In: 530 [P.O.:480; IV Piggyback:50] Out: 6026 [Urine:100] Intake/Output from this shift: Total I/O In: 360 [P.O.:360] Out: 2 [Urine:1; Stool:1]  Labs:  [facility] 03/16/11 0630 03/16/11 0500 03/15/11 2153 03/15/11 0832  WBC -- 8.8 -- 6.9  HGB -- 11.1* -- 10.9*  PLT -- 170 182 179  LABCREA -- -- -- --  CREATININE 5.28* -- -- 8.75*   Estimated Creatinine Clearance: 13.6 ml/min (by C-G formula based on Cr of 5.28). No results found for this basename: VANCOTROUGH:2,VANCOPEAK:2,VANCORANDOM:2,GENTTROUGH:2,GENTPEAK:2,GENTRANDOM:2,TOBRATROUGH:2,TOBRAPEAK:2,TOBRARND:2,AMIKACINPEAK:2,AMIKACINTROU:2,AMIKACIN:2, in the last 72 hours   Microbiology: Recent Results (from the past 720 hour(s))  CULTURE, BLOOD (ROUTINE X 2)     Status: Normal   Collection Time   03/01/11  5:55 PM      Component Value Range Status Comment   Specimen Description BLOOD ARM RIGHT   Final    Special Requests BOTTLES DRAWN AEROBIC ONLY 3CC   Final    Setup Time [id number]   Final    Culture NO GROWTH 5 DAYS   Final    Report Status 03/07/2011 FINAL   Final   CULTURE, BLOOD (ROUTINE X 2)     Status: Normal   Collection Time   03/01/11  6:05 PM      Component Value Range Status Comment   Specimen Description BLOOD ARM RIGHT   Final    Special Requests BOTTLES DRAWN AEROBIC ONLY 3CC LOWER   Final    Setup Time [id number]   Final    Culture NO GROWTH 5 DAYS   Final    Report Status 03/07/2011 FINAL   Final   WOUND CULTURE      Status: Normal   Collection Time   03/01/11  7:01 PM      Component Value Range Status Comment   Specimen Description WOUND TOE   Final    Special Requests NONE   Final    Gram Stain     Final    Value: ABUNDANT WBC PRESENT, PREDOMINANTLY PMN     RARE SQUAMOUS EPITHELIAL CELLS PRESENT     ABUNDANT GRAM POSITIVE COCCI IN PAIRS     IN CLUSTERS RARE GRAM NEGATIVE RODS   Culture     Final    Value: ABUNDANT STAPHYLOCOCCUS AUREUS     Note: RIFAMPIN AND GENTAMICIN SHOULD NOT BE USED AS SINGLE DRUGS FOR TREATMENT OF STAPH INFECTIONS. This organism DOES NOT demonstrate inducible Clindamycin resistance in vitro.   Report Status 03/04/2011 FINAL   Final    Organism ID, Bacteria STAPHYLOCOCCUS AUREUS   Final   CULTURE, BLOOD (ROUTINE X 2)     Status: Normal (Preliminary result)   Collection Time   03/15/11  2:30 PM      Component Value Range Status Comment   Specimen Description BLOOD   Final    Special Requests     Final    Value: BOTTLES DRAWN AEROBIC AND ANAEROBIC 10CC RIGHT IJ CATHETER   Setup Time [id number]   Final  Culture     Final    Value: YEAST     Note: Gram Stain Report Called to,Read Back By and Verified With: JENAE MALONE @ 1030 03/16/11 WICKN   Report Status PENDING   Incomplete   CULTURE, BLOOD (ROUTINE X 2)     Status: Normal (Preliminary result)   Collection Time   03/15/11  3:20 PM      Component Value Range Status Comment   Specimen Description BLOOD HEMODIALYSIS CATHETER   Final    Special Requests BOTTLES DRAWN AEROBIC AND ANAEROBIC 10CC   Final    Setup Time [id number]   Final    Culture     Final    Value:        BLOOD CULTURE RECEIVED NO GROWTH TO DATE CULTURE WILL BE HELD FOR 5 DAYS BEFORE ISSUING A FINAL NEGATIVE REPORT   Report Status PENDING   Incomplete   PATHOLOGIST SMEAR REVIEW     Status: Normal   Collection Time   03/15/11  9:53 PM      Component Value Range Status Comment   Tech Review     Final    Value: MILD ABSOLUTE NEUTROPHILIC  LEUKOCYTOSIS WITH NO LEFT SHIFT.    Medical History: Past Medical History  Diagnosis Date  . Hypertension   . Anemia   . Diabetes mellitus   . Renal failure   . Peripheral vascular disease   . Hemodialysis patient 03/15/11    "Tues; Thurs; Sat; [facility]"  . Blood transfusion   . Arthritis     right knee  . Chronic back pain   . Rash 03/15/11    "admitted me to find out what its' from; UE, waist, thighs, groin"    Medications:  Scheduled:    . amLODipine  5 mg Oral Daily  . darbepoetin (ARANESP) injection - DIALYSIS  60 mcg Intravenous Q Tue-HD  . famotidine (PEPCID) IV  20 mg Intravenous Q12H  . famotidine (PEPCID) IV  20 mg Intravenous Q12H  . insulin aspart  0-5 Units Subcutaneous QHS  . insulin aspart  0-9 Units Subcutaneous TID WC  . insulin glargine  12 Units Subcutaneous QHS  . methylPREDNISolone (SOLU-MEDROL) injection  125 mg Intravenous Once  . micafungin (MYCAMINE) IV  100 mg Intravenous STAT  . micafungin (MYCAMINE) IV  100 mg Intravenous Q24H  . paricalcitol  2 mcg Intravenous Q T,Th,Sa-HD  . predniSONE  60 mg Oral QAC breakfast  . DISCONTD: diphenhydrAMINE       Infusions:   Assessment: 50 yo with ESRD who was admitted for pruritic rash all over her body. One of her blood cultures is growing yeast. I contacted Dr. Isidoro Donning, we will micafungin empirically until speciated. Dr. Isidoro Donning will consult ID.    Plan:  1. Micafungin 100mg  IV q24 2. F/u with blood culture  [facility] 03/16/2011,11:08 AM

## 2011-03-16 NOTE — Progress Notes (Signed)
Physical Therapy Patient Details Name: [name] MRN: [id number] DOB: 1960-11-08 Today's Date: 03/16/2011  Problem List:  Patient Active Problem List  Diagnoses  . Diabetic foot ulcer  . Diabetes mellitus  . HTN (hypertension)  . Anemia  . ESRD (end stage renal disease) on dialysis  . Obesities, morbid  . Osteomyelitis  . Secondary hyperparathyroidism (of renal origin)  . Anemia of chronic disease  . Complication of vascular access for dialysis  . Purpura  . Hyperkalemia    Past Medical History:  Past Medical History  Diagnosis Date  . Hypertension   . Anemia   . Diabetes mellitus   . Renal failure   . Peripheral vascular disease   . Hemodialysis patient 03/15/11    "Tues; Thurs; Sat; [facility]"  . Blood transfusion   . Arthritis     right knee  . Chronic back pain   . Rash 03/15/11    "admitted me to find out what its' from; UE, waist, thighs, groin"   Past Surgical History:  Past Surgical History  Procedure Date  . Insertion of dialysis catheter 07/29/10    right subclavian  . Arteriovenous graft placement 10/01/10    left upper arm  . Below knee leg amputation 2004    left  . Amputation 03/04/2011    Procedure: AMPUTATION DIGIT;  Surgeon: [name], MD;  Location: [facility] OR;  Service: Orthopedics;  Laterality: Right;  RT GREAT TOE AMP  . Cataract extraction w/ intraocular lens implant 12/2009    right eye  . Carpal tunnel release ~ 2009    left wrist    Pt observed up Independent in room and notes having no PT needs at this time.  Will sign off PT.  Please re-order if PT needed.  Thanks!   Sunny Schlein, [place] [phone] 03/16/2011, 11:45 AM

## 2011-03-16 NOTE — Progress Notes (Signed)
Inpatient Diabetes Program Recommendations  AACE/ADA: New Consensus Statement on Inpatient Glycemic Control (2009)  Target Ranges:  Prepandial:   less than 140 mg/dL      Peak postprandial:   less than 180 mg/dL (1-2 hours)      Critically ill patients:  140 - 180 mg/dL   Reason for Visit: Elevated glucose:  274, 329 mg/dL  Inpatient Diabetes Program Recommendations Insulin - Meal Coverage: Add Novolog 3 units TID while on Prednisone

## 2011-03-16 NOTE — Progress Notes (Signed)
UTILIZATION REVIEW COMPLETE [name] 03/16/2011 [phone] OR [phone]

## 2011-03-16 NOTE — Progress Notes (Signed)
Just informed by the pharmacist that patient has blood cultures that have just returned and  are growing yeast!  May well be the source of the purpura fulminans.  They have also made primary team aware and micafungin to be started per pharmacy.  I am told ID consult will also be obtained.  [name]

## 2011-03-16 NOTE — Consult Note (Signed)
WOC consult Note Reason for Consult: Consult requested for right foot wound. Wound type: Pt had recent toe amputation by Dr Lajoyce Corners.  He told her to leave current dressing which is covered with coban and is dry and intact to right foot until next appointment, which is scheduled for Monday.  Will continue with this present plan of care.  If further wound assessment or orders desired, please contact Dr Lajoyce Corners. Will not plan to follow further unless re-consulted.  [address], RN, MSN, [employer]  [phone]

## 2011-03-17 ENCOUNTER — Inpatient Hospital Stay (HOSPITAL_COMMUNITY): Payer: Medicare Other

## 2011-03-17 LAB — GLUCOSE, CAPILLARY
Glucose-Capillary: 100 mg/dL — ABNORMAL HIGH (ref 70–99)
Glucose-Capillary: 148 mg/dL — ABNORMAL HIGH (ref 70–99)

## 2011-03-17 LAB — URINE CULTURE: Colony Count: 85000

## 2011-03-17 MED ORDER — HEPARIN SODIUM (PORCINE) 1000 UNIT/ML DIALYSIS
20.0000 [IU]/kg | INTRAMUSCULAR | Status: DC | PRN
  Filled 2011-03-17: qty 2

## 2011-03-17 MED ORDER — HEPARIN SODIUM (PORCINE) 5000 UNIT/ML IJ SOLN
5000.0000 [IU] | Freq: Three times a day (TID) | INTRAMUSCULAR | Status: DC
  Administered 2011-03-18 – 2011-03-21 (×8): 5000 [IU] via SUBCUTANEOUS
  Filled 2011-03-17 (×13): qty 1

## 2011-03-17 NOTE — Progress Notes (Signed)
Orthopedic Tech Progress Note Patient Details:  MALISSIE MUSGRAVE 29-Apr-1960 [id number]  Other Ortho Devices Type of Ortho Device: Other (comment) Ortho Device Location: applied post op shoe to right foot   Gaye Pollack 03/17/2011, 3:00 PM

## 2011-03-17 NOTE — Progress Notes (Signed)
Subjective: No current complaints, itching slightly better.  Objective: Vital signs in last 24 hours: Temp:  [97.9 F (36.6 C)-98.6 F (37 C)] 98 F (36.7 C) (11/22 4540) Pulse Rate:  [67-82] 67  (11/22 0613) Resp:  [18-20] 18  (11/22 0613) BP: (136-181)/(70-89) 136/74 mmHg (11/22 0613) SpO2:  [92 %-100 %] 92 % (11/22 9811) Weight:  [89.4 kg (197 lb 1.5 oz)] 197 lb 1.5 oz (89.4 kg) (11/21 2330) Weight change: 2.4 kg (5 lb 4.7 oz)  Intake/Output from previous day: 11/21 0701 - 11/22 0700 In: 960 [P.O.:960] Out: 4 [Urine:2; Stool:2]   EXAM: General appearance:  Alert, in no apparent distress Resp:  CTA B Cardio:  RRR without murmur   GI:  + BS, soft & nontender Extremities:  No edema; purpuric, non-blanching rash fading since yesterday, with dry pustular lesions on right upper leg Access:  Right IJ catheter, AVF @ LUA with + bruit  Lab Results:  Basename 03/16/11 0500 03/15/11 2153 03/15/11 0832  WBC 8.8 -- 6.9  HGB 11.1* -- 10.9*  HCT 34.1* -- 33.4*  PLT 170 182 --   BMET:  Basename 03/16/11 0630 03/15/11 2153 03/15/11 0944 03/15/11 0832  NA 133* -- -- 131*  K 4.8 -- 6.8* --  CL 95* -- -- 92*  CO2 21 -- -- 21  GLUCOSE 222* -- -- 110*  BUN 36* -- -- 66*  CREATININE 5.28* -- -- 8.75*  CALCIUM 8.8 -- -- 8.1*  ALBUMIN 3.3* 4.0 -- --   No results found for this basename: PTH:2 in the last 72 hours Iron Studies: No results found for this basename: IRON,TIBC,TRANSFERRIN,FERRITIN in the last 72 hours  Assessment/Plan: 1.  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 Gilda Crease in [place] in June, but has matured slowly, infiltrated during last attempt on 10/23.  Catheter removal today per IR, will attempt to use AVF tomorrow with HD. 2.  ESRD - on HD on TTS, last HD on  11/20, stable.  Next HD tomorrow with AVF. 3  Hyperkalemia - K improved from 6.8 with HD on 11/20, now stable @ 4.8. 4.  Hypertension/Volume - BP stable on Amlodipine 5 mg qd, volume currently stable, but removed 5-6 L on 11/20. 5.  Secondary hyperparathyroidism - on Zemplar 2 mcg per HD and Phoslo with meals. 6.  Anemia - Hgb 11.1 s/p Aranesp 60 mcg on 11/20. 7.  DM - on Insulin. 8.  PVD - s/p L BKA.   LOS: 2 days   LYLES,CHARLES 03/17/2011,9:17 AM  I have examined the patient, reviewed her database and I agree with the assessment and plans laid out by Gerome Apley PA. Ongoing treatment for fungemia as we await eval for endocarditis. Will cannulate her AVF tomorrow for dialysis with 17G needles. (its appears clinically suitable for cannulation)  Ronna Herskowitz K.

## 2011-03-17 NOTE — Consult Note (Signed)
Reason for Consult:systemic infection, r/o infection coming from foot Referring Physician:   [name] is an 50 y.o. female.  HPI: status post amputation right great toe  Past Medical History  Diagnosis Date  . Hypertension   . Anemia   . Diabetes mellitus   . Renal failure   . Peripheral vascular disease   . Hemodialysis patient 03/15/11    "Tues; Thurs; Sat; [facility]"  . Blood transfusion   . Arthritis     right knee  . Chronic back pain   . Rash 03/15/11    "admitted me to find out what its' from; UE, waist, thighs, groin"    Past Surgical History  Procedure Date  . Insertion of dialysis catheter 07/29/10    right subclavian  . Arteriovenous graft placement 10/01/10    left upper arm  . Below knee leg amputation 2004    left  . Amputation 03/04/2011    Procedure: AMPUTATION DIGIT;  Surgeon: [name], MD;  Location: [facility] OR;  Service: Orthopedics;  Laterality: Right;  RT GREAT TOE AMP  . Cataract extraction w/ intraocular lens implant 12/2009    right eye  . Carpal tunnel release ~ 2009    left wrist    History reviewed. No pertinent family history.  Social History:  reports that she has never smoked. She has never used smokeless tobacco. She reports that she does not drink alcohol or use illicit drugs.  Allergies:  Allergies  Allergen Reactions  . Codeine   . Levaquin   . Penicillins     Medications: I have reviewed the patient's current medications.  Results for orders placed during the hospital encounter of 03/15/11 (from the past 48 hour(s))  CULTURE, BLOOD (ROUTINE X 2)     Status: Normal (Preliminary result)   Collection Time   03/15/11  2:30 PM      Component Value Range Comment   Specimen Description BLOOD      Special Requests        Value: BOTTLES DRAWN AEROBIC AND ANAEROBIC 10CC RIGHT IJ CATHETER   Setup Time [id number]      Culture        Value: YEAST     Note: Gram Stain Report Called to,Read Back By and  Verified With: JENAE MALONE @ 1030 03/16/11 WICKN   Report Status PENDING     CULTURE, BLOOD (ROUTINE X 2)     Status: Normal (Preliminary result)   Collection Time   03/15/11  3:20 PM      Component Value Range Comment   Specimen Description BLOOD HEMODIALYSIS CATHETER      Special Requests BOTTLES DRAWN AEROBIC AND ANAEROBIC 10CC      Setup Time [id number]      Culture        Value: YEAST     Note: Gram Stain Report Called to,Read Back By and Verified With: TINA ISAACS @ 0044 ON 03/17/11 BY GOLLD   Report Status PENDING     GLUCOSE, CAPILLARY     Status: Abnormal   Collection Time   03/15/11  9:20 PM      Component Value Range Comment   Glucose-Capillary 134 (*) 70 - 99 (mg/dL)    Comment 1 Documented in Chart      Comment 2 Notify RN     HEPATIC FUNCTION PANEL     Status: Abnormal   Collection Time   03/15/11  9:53 PM      Component  Value Range Comment   Total Protein 9.8 (*) 6.0 - 8.3 (g/dL)    Albumin 4.0  3.5 - 5.2 (g/dL)    AST 18  0 - 37 (U/L)    ALT 11  0 - 35 (U/L)    Alkaline Phosphatase 299 (*) 39 - 117 (U/L)    Total Bilirubin 1.3 (*) 0.3 - 1.2 (mg/dL)    Bilirubin, Direct 0.5 (*) 0.0 - 0.3 (mg/dL)    Indirect Bilirubin 0.8  0.3 - 0.9 (mg/dL)   ANTITHROMBIN III     Status: Abnormal   Collection Time   03/15/11  9:53 PM      Component Value Range Comment   AntiThromb III Func 124 (*) 75 - 120 (%)   HOMOCYSTEINE, SERUM     Status: Abnormal   Collection Time   03/15/11  9:53 PM      Component Value Range Comment   Homocysteine-Norm 27.9 (*) 4.0 - 15.4 (umol/L)   DIC PANEL     Status: Abnormal   Collection Time   03/15/11  9:53 PM      Component Value Range Comment   Prothrombin Time 14.3  11.6 - 15.2 (seconds)    INR 1.09  0.00 - 1.49     aPTT 41 (*) 24 - 37 (seconds)    Fibrinogen 591 (*) 204 - 475 (mg/dL)    D-Dimer, Quant 8.11 (*) 0.00 - 0.48 (ug/mL-FEU)    Platelets 182  150 - 400 (K/uL)    Smear Review NO SCHISTOCYTES SEEN     SEDIMENTATION RATE      Status: Abnormal   Collection Time   03/15/11  9:53 PM      Component Value Range Comment   Sed Rate 58 (*) 0 - 22 (mm/hr)   C-REACTIVE PROTEIN     Status: Abnormal   Collection Time   03/15/11  9:53 PM      Component Value Range Comment   CRP 5.29 (*) <0.60 (mg/dL)   PATHOLOGIST SMEAR REVIEW     Status: Normal   Collection Time   03/15/11  9:53 PM      Component Value Range Comment   Tech Review        Value: MILD ABSOLUTE NEUTROPHILIC LEUKOCYTOSIS WITH NO LEFT SHIFT.  LIPID PANEL     Status: Abnormal   Collection Time   03/15/11  9:54 PM      Component Value Range Comment   Cholesterol 195  0 - 200 (mg/dL)    Triglycerides 914  <150 (mg/dL)    HDL 43  >78 (mg/dL)    Total CHOL/HDL Ratio 4.5      VLDL 22  0 - 40 (mg/dL)    LDL Cholesterol 295 (*) 0 - 99 (mg/dL)   CBC     Status: Abnormal   Collection Time   03/16/11  5:00 AM      Component Value Range Comment   WBC 8.8  4.0 - 10.5 (K/uL)    RBC 3.94  3.87 - 5.11 (MIL/uL)    Hemoglobin 11.1 (*) 12.0 - 15.0 (g/dL)    HCT 62.1 (*) 30.8 - 46.0 (%)    MCV 86.5  78.0 - 100.0 (fL)    MCH 28.2  26.0 - 34.0 (pg)    MCHC 32.6  30.0 - 36.0 (g/dL)    RDW 65.7  84.6 - 96.2 (%)    Platelets 170  150 - 400 (K/uL)   RENAL FUNCTION PANEL  Status: Abnormal   Collection Time   03/16/11  6:30 AM      Component Value Range Comment   Sodium 133 (*) 135 - 145 (mEq/L)    Potassium 4.8  3.5 - 5.1 (mEq/L) DELTA CHECK NOTED   Chloride 95 (*) 96 - 112 (mEq/L)    CO2 21  19 - 32 (mEq/L)    Glucose, Bld 222 (*) 70 - 99 (mg/dL)    BUN 36 (*) 6 - 23 (mg/dL) DELTA CHECK NOTED   Creatinine, Ser 5.28 (*) 0.50 - 1.10 (mg/dL) DELTA CHECK NOTED   Calcium 8.8  8.4 - 10.5 (mg/dL)    Phosphorus 7.7 (*) 2.3 - 4.6 (mg/dL)    Albumin 3.3 (*) 3.5 - 5.2 (g/dL)    GFR calc non Af Amer 9 (*) >90 (mL/min)    GFR calc Af Amer 10 (*) >90 (mL/min)   GLUCOSE, CAPILLARY     Status: Abnormal   Collection Time   03/16/11  7:58 AM      Component Value Range  Comment   Glucose-Capillary 274 (*) 70 - 99 (mg/dL)   URINALYSIS, ROUTINE W REFLEX MICROSCOPIC     Status: Abnormal   Collection Time   03/16/11  9:03 AM      Component Value Range Comment   Color, Urine YELLOW  YELLOW     Appearance CLOUDY (*) CLEAR     Specific Gravity, Urine 1.029  1.005 - 1.030     pH 7.5  5.0 - 8.0     Glucose, UA >1000 (*) NEGATIVE (mg/dL)    Hgb urine dipstick MODERATE (*) NEGATIVE     Bilirubin Urine SMALL (*) NEGATIVE     Ketones, ur 40 (*) NEGATIVE (mg/dL)    Protein, ur >409 (*) NEGATIVE (mg/dL)    Urobilinogen, UA 0.2  0.0 - 1.0 (mg/dL)    Nitrite POSITIVE (*) NEGATIVE     Leukocytes, UA NEGATIVE  NEGATIVE    URINE CULTURE     Status: Normal   Collection Time   03/16/11  9:03 AM      Component Value Range Comment   Specimen Description URINE, CLEAN CATCH      Special Requests NONE      Setup Time [id number]      Colony Count 85,000 COLONIES/ML      Culture        Value: Multiple bacterial morphotypes present, none predominant. Suggest appropriate recollection if clinically indicated.   Report Status 03/17/2011 FINAL     URINE MICROSCOPIC-ADD ON     Status: Abnormal   Collection Time   03/16/11  9:03 AM      Component Value Range Comment   Squamous Epithelial / LPF MANY (*) RARE     WBC, UA 0-2  <3 (WBC/hpf)    RBC / HPF 7-10  <3 (RBC/hpf)    Bacteria, UA FEW (*) RARE     Casts HYALINE CASTS (*) NEGATIVE    GLUCOSE, CAPILLARY     Status: Abnormal   Collection Time   03/16/11 11:19 AM      Component Value Range Comment   Glucose-Capillary 329 (*) 70 - 99 (mg/dL)   CULTURE, BLOOD (ROUTINE X 2)     Status: Normal (Preliminary result)   Collection Time   03/16/11 12:05 PM      Component Value Range Comment   Specimen Description BLOOD RIGHT ARM      Special Requests BOTTLES DRAWN AEROBIC AND ANAEROBIC 10CC  Setup Time [phone]      Culture        Value:        BLOOD CULTURE RECEIVED NO GROWTH TO DATE CULTURE WILL BE HELD FOR 5 DAYS  BEFORE ISSUING A FINAL NEGATIVE REPORT   Report Status PENDING     CULTURE, BLOOD (ROUTINE X 2)     Status: Normal (Preliminary result)   Collection Time   03/16/11 12:10 PM      Component Value Range Comment   Specimen Description BLOOD RIGHT ARM      Special Requests BOTTLES DRAWN AEROBIC AND ANAEROBIC 10CC      Setup Time [id number]      Culture        Value:        BLOOD CULTURE RECEIVED NO GROWTH TO DATE CULTURE WILL BE HELD FOR 5 DAYS BEFORE ISSUING A FINAL NEGATIVE REPORT   Report Status PENDING     HEMOGLOBIN A1C     Status: Abnormal   Collection Time   03/16/11  1:30 PM      Component Value Range Comment   Hemoglobin A1C 6.1 (*) <5.7 (%)    Mean Plasma Glucose 128 (*) <117 (mg/dL)   GLUCOSE, CAPILLARY     Status: Abnormal   Collection Time   03/16/11  4:23 PM      Component Value Range Comment   Glucose-Capillary 210 (*) 70 - 99 (mg/dL)   GLUCOSE, CAPILLARY     Status: Abnormal   Collection Time   03/16/11 11:35 PM      Component Value Range Comment   Glucose-Capillary 172 (*) 70 - 99 (mg/dL)    Comment 1 Notify RN      Comment 2 Documented in Chart     GLUCOSE, CAPILLARY     Status: Abnormal   Collection Time   03/17/11  7:59 AM      Component Value Range Comment   Glucose-Capillary 153 (*) 70 - 99 (mg/dL)   GLUCOSE, CAPILLARY     Status: Abnormal   Collection Time   03/17/11 12:09 PM      Component Value Range Comment   Glucose-Capillary 100 (*) 70 - 99 (mg/dL)    Comment 1 Notify RN      Comment 2 Documented in Chart       No results found.  ROS Blood pressure 123/84, pulse 75, temperature 97.9 F (36.6 C), temperature source Oral, resp. rate 18, height 5\' 3"  (1.6 m), weight 89.4 kg (197 lb 1.5 oz), SpO2 94.00%. Physical Exam Dressing removed from right leg, incision well healed, no abscess, no cellulitis, petechial fash over entire body Assessment/Plan: Well healing amputation right great toe, no sign of infection coming from foot Start PT WBAT, TED  hose right  Leasa Kincannon V 03/17/2011, 1:51 PM

## 2011-03-17 NOTE — Progress Notes (Addendum)
Subjective: No specific complaints by the patient herself, Objective: Weight change: 2.4 kg (5 lb 4.7 oz)  Intake/Output Summary (Last 24 hours) at 03/17/11 1156 Last data filed at 03/17/11 0845  Gross per 24 hour  Intake    480 ml  Output      2 ml  Net    478 ml   Blood pressure 123/84, pulse 75, temperature 97.9 F (36.6 C), temperature source Oral, resp. rate 18, height 5\' 3"  (1.6 m), weight 89.4 kg (197 lb 1.5 oz), SpO2 94.00%.  Physical Exam: General: Alert and awake, oriented x3, not in any acute distress. HEENT: anicteric sclera, pupils reactive to light and accommodation, EOMI CVS: S1-S2 clear, no murmur rubs or gallops Chest: clear to auscultation bilaterally, no wheezing, rales or rhonchi Abdomen: soft nontender, nondistended, normal bowel sounds, no organomegaly Extremities: Left BKA Skin: non-blanchable and dark red/purplish, purpuric rash on both thighs extending into the stump of the left BKA and on the right leg, left flank/back area, left arm, right arm, no weeping discharge    Lab Results: Basic Metabolic Panel:  Lab 03/16/11 9147 03/15/11 0944 03/15/11 0832  NA 133* -- 131*  K 4.8 6.8* --  CL 95* -- 92*  CO2 21 -- 21  GLUCOSE 222* -- 110*  BUN 36* -- 66*  CREATININE 5.28* -- 8.75*  CALCIUM 8.8 -- 8.1*  MG -- -- --  PHOS 7.7* -- --   Liver Function Tests:  Lab 03/16/11 0630 03/15/11 2153  AST -- 18  ALT -- 11  ALKPHOS -- 299*  BILITOT -- 1.3*  PROT -- 9.8*  ALBUMIN 3.3* 4.0   No results found for this basename: LIPASE:2,AMYLASE:2 in the last 168 hours No results found for this basename: AMMONIA:2 in the last 168 hours CBC:  Lab 03/16/11 0500 03/15/11 2153 03/15/11 0832  WBC 8.8 -- 6.9  NEUTROABS -- -- --  HGB 11.1* -- 10.9*  HCT 34.1* -- 33.4*  MCV 86.5 -- 86.1  PLT 170 182 --   Cardiac Enzymes: No results found for this basename: CKTOTAL:3,CKMB:3,CKMBINDEX:3,TROPONINI:3 in the last 168 hours BNP: No results found for this basename:  POCBNP:2 in the last 168 hours CBG:  Lab 03/16/11 2335 03/16/11 1623 03/16/11 1119 03/16/11 0758 03/15/11 2120  GLUCAP 172* 210* 329* 274* 134*     Micro Results: Recent Results (from the past 240 hour(s))  CULTURE, BLOOD (ROUTINE X 2)     Status: Normal (Preliminary result)   Collection Time   03/15/11  2:30 PM      Component Value Range Status Comment   Specimen Description BLOOD   Final    Special Requests     Final    Value: BOTTLES DRAWN AEROBIC AND ANAEROBIC 10CC RIGHT IJ CATHETER   Setup Time [id number]   Final    Culture     Final    Value: YEAST     Note: Gram Stain Report Called to,Read Back By and Verified With: JENAE MALONE @ 1030 03/16/11 WICKN   Report Status PENDING   Incomplete   CULTURE, BLOOD (ROUTINE X 2)     Status: Normal (Preliminary result)   Collection Time   03/15/11  3:20 PM      Component Value Range Status Comment   Specimen Description BLOOD HEMODIALYSIS CATHETER   Final    Special Requests BOTTLES DRAWN AEROBIC AND ANAEROBIC 10CC   Final    Setup Time [id number]   Final    Culture  Final    Value:        BLOOD CULTURE RECEIVED NO GROWTH TO DATE CULTURE WILL BE HELD FOR 5 DAYS BEFORE ISSUING A FINAL NEGATIVE REPORT   Report Status PENDING   Incomplete   PATHOLOGIST SMEAR REVIEW     Status: Normal   Collection Time   03/15/11  9:53 PM      Component Value Range Status Comment   Tech Review     Final    Value: MILD ABSOLUTE NEUTROPHILIC LEUKOCYTOSIS WITH NO LEFT SHIFT.    Studies/Results: Dg Toe Great Right  03/01/2011  *RADIOLOGY REPORT*  Clinical Data: Open wound.  RIGHT TOE - 2+ VIEW  Comparison: None  Findings: Soft tissue thickening and irregularity noted.  There is destructive bony change involving the distal tuft region of the distal phalanx suggesting osteomyelitis.  Joint spaces are maintained.  IMPRESSION: Plain film findings suspicious for osteomyelitis involving the distal tuft region of the distal phalanx.  Original Report  Authenticated By: P. Loralie Champagne, M.D.    Medications: Scheduled Meds:    . amLODipine  5 mg Oral Daily  . darbepoetin (ARANESP) injection - DIALYSIS  60 mcg Intravenous Q Tue-HD  . famotidine (PEPCID) IV  20 mg Intravenous Q24H  . fentaNYL  250 mcg Intravenous Once  . heparin subcutaneous  5,000 Units Subcutaneous Q8H  . insulin aspart  0-20 Units Subcutaneous TID WC  . insulin aspart  0-5 Units Subcutaneous QHS  . insulin glargine  15 Units Subcutaneous QHS  . micafungin (MYCAMINE) IV  100 mg Intravenous STAT  . micafungin (MYCAMINE) IV  100 mg Intravenous Q24H  . midazolam  10 mg Intravenous Once  . paricalcitol  2 mcg Intravenous Q T,Th,Sa-HD  . sodium chloride  3 mL Intravenous Q12H  . DISCONTD: famotidine (PEPCID) IV  20 mg Intravenous Q12H  . DISCONTD: famotidine (PEPCID) IV  20 mg Intravenous Q12H  . DISCONTD: heparin subcutaneous  5,000 Units Subcutaneous Q8H  . DISCONTD: insulin aspart  0-5 Units Subcutaneous QHS  . DISCONTD: insulin aspart  0-9 Units Subcutaneous TID WC  . DISCONTD: insulin glargine  12 Units Subcutaneous QHS  . DISCONTD: predniSONE  60 mg Oral QAC breakfast   Continuous Infusions:    . sodium chloride    . 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 D-dimer elevated, low probability for PE given no hypoxia/tachycardia  -  Continue Benadryl, Pepcid. Steroids discontinued yesterday after my discussion with Dr. Yetta Barre (dermatology), will relay the biopsy results to me once available. - TEE tomorrow to rule out endocarditis causing leukocytoclastic vasculitis.  Yeast bacteremia: 2/2  - Repeat 2 sets of blood cultures done yesterday results pending, started on micafungin, ID following (Dr. Ilsa Iha), right IJ catheter possible source - TEE tomorrow to rule out endocarditis - Renal planning to have right IJ catheter removed today  .Hyperkalemia: Resolved after the hemodialysis  .Diabetes mellitus: Blood sugars improving after steroids discontinued - Continue Lantus to 15 units, change sliding scale insulin to moderate today   .HTN (hypertension): Increase Norvasc to 10 mg daily   .ESRD (end stage renal disease) on  dialysis: On HD Tuesday, Thursday and Saturday, renal following, to use AVF for tomorrow HD   .Secondary hyperparathyroidism (of renal origin): Per renal   .Anemia of chronic disease: Per renal service, hemoglobin at baseline   .Diabetic foot ulcer/ Osteomyelitis: Status post recent right great toe amputation - will consult orthopedics for the reassessment of the diabetic foot ulcer, recent amputation  DVT prophylaxis:  heparin subcutaneous for DVT prophylaxis, continue to monitor platelets.    LOS: 1 day   RAI,RIPUDEEP 03/17/2011, 11:56 AM   No interval change since this note.  [name] 03/18/2011 10:45 AM

## 2011-03-17 NOTE — Procedures (Signed)
LIJV permcath removal.  No comp.

## 2011-03-18 ENCOUNTER — Encounter (HOSPITAL_COMMUNITY)
Admission: EM | Disposition: A | Discharge status: Home / Self Care | Payer: Self-pay | Source: Home / Self Care | Attending: Internal Medicine

## 2011-03-18 ENCOUNTER — Encounter (HOSPITAL_COMMUNITY): Payer: Self-pay | Admitting: *Deleted

## 2011-03-18 ENCOUNTER — Inpatient Hospital Stay (HOSPITAL_COMMUNITY): Payer: Medicare Other

## 2011-03-18 DIAGNOSIS — R7881 Bacteremia: Secondary | ICD-10-CM

## 2011-03-18 HISTORY — PX: TEE WITHOUT CARDIOVERSION: SHX5443

## 2011-03-18 LAB — LUPUS ANTICOAGULANT PANEL
PTT Lupus Anticoagulant: 53.1 secs — ABNORMAL HIGH (ref 28.0–43.0)
PTTLA Confirmation: 2.8 secs (ref <8.0)

## 2011-03-18 LAB — CBC
MCV: 85.9 fL (ref 78.0–100.0)
Platelets: 171 10*3/uL (ref 150–400)
RDW: 14.9 % (ref 11.5–15.5)
WBC: 6.7 10*3/uL (ref 4.0–10.5)

## 2011-03-18 LAB — GLUCOSE, CAPILLARY
Glucose-Capillary: 106 mg/dL — ABNORMAL HIGH (ref 70–99)
Glucose-Capillary: 154 mg/dL — ABNORMAL HIGH (ref 70–99)

## 2011-03-18 LAB — RENAL FUNCTION PANEL
Albumin: 3 g/dL — ABNORMAL LOW (ref 3.5–5.2)
BUN: 75 mg/dL — ABNORMAL HIGH (ref 6–23)
Chloride: 94 mEq/L — ABNORMAL LOW (ref 96–112)
Creatinine, Ser: 8.13 mg/dL — ABNORMAL HIGH (ref 0.50–1.10)
Phosphorus: 8.4 mg/dL — ABNORMAL HIGH (ref 2.3–4.6)

## 2011-03-18 LAB — PROTEIN S ACTIVITY: Protein S Activity: 118 % (ref 69–129)

## 2011-03-18 SURGERY — ECHOCARDIOGRAM, TRANSESOPHAGEAL
Anesthesia: Moderate Sedation

## 2011-03-18 MED ORDER — CALCIUM ACETATE 667 MG PO CAPS
2001.0000 mg | ORAL_CAPSULE | Freq: Three times a day (TID) | ORAL | Status: DC
  Administered 2011-03-18 – 2011-03-21 (×7): 2001 mg via ORAL
  Filled 2011-03-18 (×12): qty 3

## 2011-03-18 MED ORDER — MIDAZOLAM HCL 10 MG/2ML IJ SOLN
INTRAMUSCULAR | Status: DC | PRN
  Administered 2011-03-18: 2 mg via INTRAVENOUS

## 2011-03-18 MED ORDER — SODIUM CHLORIDE 0.9 % IV SOLN
Freq: Once | INTRAVENOUS | Status: AC
  Administered 2011-03-18: 20:00:00 via INTRAVENOUS

## 2011-03-18 MED ORDER — MIDAZOLAM HCL 10 MG/2ML IJ SOLN
INTRAMUSCULAR | Status: AC
  Filled 2011-03-18: qty 2

## 2011-03-18 MED ORDER — FENTANYL CITRATE 0.05 MG/ML IJ SOLN
INTRAMUSCULAR | Status: AC
  Filled 2011-03-18: qty 2

## 2011-03-18 MED ORDER — HEPARIN SODIUM (PORCINE) 1000 UNIT/ML DIALYSIS
20.0000 [IU]/kg | INTRAMUSCULAR | Status: DC | PRN
  Administered 2011-03-18 – 2011-03-19 (×2): 1800 [IU] via INTRAVENOUS_CENTRAL
  Filled 2011-03-18: qty 2

## 2011-03-18 MED ORDER — FENTANYL CITRATE 0.05 MG/ML IJ SOLN
INTRAMUSCULAR | Status: DC | PRN
  Administered 2011-03-18: 25 ug via INTRAVENOUS

## 2011-03-18 NOTE — Procedures (Signed)
[facility]  Procedure Note: TEE  Sedation: Versed 2 mg IV, Fentanyl 25 mcg IV  Indication: Endocarditis  No complications  Brief outline of findings (see note in echo section for full details):  No evidence for endocarditis.  LV EF 55-60%.   Moderate LA dilation with smoke in LA but no LAA thrombus.   [name] 03/18/2011 11:16 AM

## 2011-03-18 NOTE — Progress Notes (Signed)
Subjective: No new complaints  Objective: Weight change: 3 lb 4.9 oz (1.5 kg)  Intake/Output Summary (Last 24 hours) at 03/18/11 1711 Last data filed at 03/18/11 1300  Gross per 24 hour  Intake    500 ml  Output      0 ml  Net    500 ml   Blood pressure 149/75, pulse 69, temperature 96.4 F (35.8 C), temperature source Oral, resp. rate 15, height 5\' 3"  (1.6 m), weight 204 lb 2.3 oz (92.6 kg), SpO2 98.00%. Temp:  [96.4 F (35.8 C)-98.7 F (37.1 C)] 96.4 F (35.8 C) (11/23 1437) Pulse Rate:  [63-70] 69  (11/23 1700) Resp:  [12-20] 15  (11/23 1700) BP: (118-171)/(53-100) 149/75 mmHg (11/23 1700) SpO2:  [94 %-100 %] 98 % (11/23 1437) Weight:  [200 lb 6.4 oz (90.9 kg)-204 lb 2.3 oz (92.6 kg)] 204 lb 2.3 oz (92.6 kg) (11/23 1437)  Physical Exam: General: Alert and awake, oriented x3, not in any acute distress. HEENT: anicteric sclera, pupils reactive to light and accommodation, EOMI CVS regular rate, normal r,  no murmur rubs or gallops Chest: clear to auscultation bilaterally, no wheezing, rales or rhonchi Abdomen: soft nontender, nondistended, normal bowel sounds, Extremities: no  clubbing or edema noted bilaterally Neuro: nonfocal  Lab Results:  Basename 03/18/11 0701 03/16/11 0500  WBC 6.7 8.8  HGB 10.0* 11.1*  HCT 31.1* 34.1*  PLT 171 170   BMET  Basename 03/18/11 0701 03/16/11 0630  NA 134* 133*  K 5.0 4.8  CL 94* 95*  CO2 22 21  GLUCOSE 109* 222*  BUN 75* 36*  CREATININE 8.13* 5.28*  CALCIUM 8.0* 8.8    Micro Results: Recent Results (from the past 240 hour(s))  CULTURE, BLOOD (ROUTINE X 2)     Status: Normal (Preliminary result)   Collection Time   03/15/11  2:30 PM      Component Value Range Status Comment   Specimen Description BLOOD   Final    Special Requests     Final    Value: BOTTLES DRAWN AEROBIC AND ANAEROBIC 10CC RIGHT IJ CATHETER   Setup Time [id number]   Final    Culture     Final    Value: YEAST     Note: Gram Stain Report Called  to,Read Back By and Verified With: JENAE MALONE @ 1030 03/16/11 WICKN   Report Status PENDING   Incomplete   CULTURE, BLOOD (ROUTINE X 2)     Status: Normal (Preliminary result)   Collection Time   03/15/11  3:20 PM      Component Value Range Status Comment   Specimen Description BLOOD HEMODIALYSIS CATHETER   Final    Special Requests BOTTLES DRAWN AEROBIC AND ANAEROBIC 10CC   Final    Setup Time [id number]   Final    Culture     Final    Value: YEAST     Note: Gram Stain Report Called to,Read Back By and Verified With: TINA ISAACS @ 0044 ON 03/17/11 BY GOLLD   Report Status PENDING   Incomplete   PATHOLOGIST SMEAR REVIEW     Status: Normal   Collection Time   03/15/11  9:53 PM      Component Value Range Status Comment   Tech Review     Final    Value: MILD ABSOLUTE NEUTROPHILIC LEUKOCYTOSIS WITH NO LEFT SHIFT.  URINE CULTURE     Status: Normal   Collection Time   03/16/11  9:03 AM  Component Value Range Status Comment   Specimen Description URINE, CLEAN CATCH   Final    Special Requests NONE   Final    Setup Time [id number]   Final    Colony Count 85,000 COLONIES/ML   Final    Culture     Final    Value: Multiple bacterial morphotypes present, none predominant. Suggest appropriate recollection if clinically indicated.   Report Status 03/17/2011 FINAL   Final   CULTURE, BLOOD (ROUTINE X 2)     Status: Normal (Preliminary result)   Collection Time   03/16/11 12:05 PM      Component Value Range Status Comment   Specimen Description BLOOD RIGHT ARM   Final    Special Requests BOTTLES DRAWN AEROBIC AND ANAEROBIC 10CC   Final    Setup Time [id number]   Final    Culture     Final    Value:        BLOOD CULTURE RECEIVED NO GROWTH TO DATE CULTURE WILL BE HELD FOR 5 DAYS BEFORE ISSUING A FINAL NEGATIVE REPORT   Report Status PENDING   Incomplete   CULTURE, BLOOD (ROUTINE X 2)     Status: Normal (Preliminary result)   Collection Time   03/16/11 12:10 PM      Component Value  Range Status Comment   Specimen Description BLOOD RIGHT ARM   Final    Special Requests BOTTLES DRAWN AEROBIC AND ANAEROBIC 10CC   Final    Setup Time [id number]   Final    Culture     Final    Value:        BLOOD CULTURE RECEIVED NO GROWTH TO DATE CULTURE WILL BE HELD FOR 5 DAYS BEFORE ISSUING A FINAL NEGATIVE REPORT   Report Status PENDING   Incomplete     Studies/Results: Dg Toe Great Right  03/01/2011  *RADIOLOGY REPORT*  Clinical Data: Open wound.  RIGHT TOE - 2+ VIEW  Comparison: None  Findings: Soft tissue thickening and irregularity noted.  There is destructive bony change involving the distal tuft region of the distal phalanx suggesting osteomyelitis.  Joint spaces are maintained.  IMPRESSION: Plain film findings suspicious for osteomyelitis involving the distal tuft region of the distal phalanx.  Original Report Authenticated By: P. Loralie Champagne, M.D.    Antibiotics:  Anti-infectives     Start     Dose/Rate Route Frequency Ordered Stop   03/17/11 1100   micafungin (MYCAMINE) 100 mg in sodium chloride 0.9 % 100 mL IVPB        100 mg 100 mL/hr over 1 Hours Intravenous Every 24 hours 03/16/11 1107     03/16/11 1130   micafungin (MYCAMINE) 100 mg in sodium chloride 0.9 % 100 mL IVPB        100 mg 100 mL/hr over 1 Hours Intravenous STAT 03/16/11 1107 03/16/11 1400          Medications: Scheduled Meds:   . sodium chloride   Intravenous Once  . amLODipine  5 mg Oral Daily  . calcium acetate  2,001 mg Oral TID WC  . darbepoetin (ARANESP) injection - DIALYSIS  60 mcg Intravenous Q Tue-HD  . famotidine (PEPCID) IV  20 mg Intravenous Q24H  . fentaNYL  250 mcg Intravenous Once  . heparin subcutaneous  5,000 Units Subcutaneous Q8H  . insulin aspart  0-20 Units Subcutaneous TID WC  . insulin aspart  0-5 Units Subcutaneous QHS  . insulin glargine  15 Units Subcutaneous QHS  . micafungin (  MYCAMINE) IV  100 mg Intravenous Q24H  . midazolam  10 mg Intravenous Once  .  paricalcitol  2 mcg Intravenous Q T,Th,Sa-HD  . sodium chloride  3 mL Intravenous Q12H   Continuous Infusions:   . sodium chloride    . sodium chloride 250 mL (03/18/11 0949)   PRN Meds:.sodium chloride, sodium chloride, acetaminophen, benzocaine, camphor-menthol, diphenhydrAMINE, diphenhydrAMINE, docusate sodium, feeding supplement (NEPRO CARB STEADY), heparin, heparin, heparin, heparin, HYDROmorphone, hydrOXYzine, lidocaine, lidocaine-prilocaine, ondansetron (ZOFRAN) IV, ondansetron, oxyCODONE, pentafluoroprop-tetrafluoroeth, promethazine, promethazine, senna-docusate, sodium chloride, sorbitol, zolpidem, DISCONTD: fentaNYL DISCONTD: midazolam  Assessment/Plan: [name] is a 50 y.o. 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 Hd Catheter is OUt and repeat blood cultures are negative to date. TEE is negative for vegetations --continue micafungin for now --If yeast is candida albicans can change to oral fluconazole to complete 2 week course, if it is candida glabrata there is risk this could be azole resistant and in that case two week course of mycafungin would be safer bet (would require central line though) --Patient does need Optho exam to exclude fungal endopthalmitis (standare of care for fungemic pt)    LOS: 3 days   Acey Lav 03/18/2011, 5:11 PM

## 2011-03-18 NOTE — Progress Notes (Signed)
Subjective: No specific complaints by the patient herself, rash improving. Dialysis cath removed yesterday  Objective: Weight change: 1.5 kg (3 lb 4.9 oz)  Intake/Output Summary (Last 24 hours) at 03/18/11 1042 Last data filed at 03/18/11 0500  Gross per 24 hour  Intake    620 ml  Output      0 ml  Net    620 ml   Blood pressure 152/76, pulse 67, temperature 97.4 F (36.3 C), temperature source Oral, resp. rate 13, height 5\' 3"  (1.6 m), weight 90.9 kg (200 lb 6.4 oz), SpO2 95.00%.  Physical Exam: General: Alert and awake, oriented x3, not in any acute distress. HEENT: anicteric sclera, pupils reactive to light and accommodation, EOMI CVS: S1-S2 clear, no murmur rubs or gallops Chest: clear to auscultation bilaterally, no wheezing, rales or rhonchi Abdomen: soft nontender, nondistended, normal bowel sounds, no organomegaly Extremities: Left BKA Skin: non-blanchable and dark red/purplish, purpuric rash on both thighs extending into the stump of the left BKA and on the right leg, left flank/back area, left arm, right arm, no weeping discharge, IMPROVING    Lab Results: Basic Metabolic Panel:  Lab 03/18/11 1610 03/16/11 0630  NA 134* 133*  K 5.0 4.8  CL 94* 95*  CO2 22 21  GLUCOSE 109* 222*  BUN 75* 36*  CREATININE 8.13* 5.28*  CALCIUM 8.0* 8.8  MG -- --  PHOS 8.4* --   Liver Function Tests:  Lab 03/18/11 0701 03/16/11 0630 03/15/11 2153  AST -- -- 18  ALT -- -- 11  ALKPHOS -- -- 299*  BILITOT -- -- 1.3*  PROT -- -- 9.8*  ALBUMIN 3.0* 3.3* --   CBC:  Lab 03/18/11 0701 03/16/11 0500  WBC 6.7 8.8  NEUTROABS -- --  HGB 10.0* 11.1*  HCT 31.1* 34.1*  MCV 85.9 86.5  PLT 171 170    CBG:  Lab 03/18/11 0746 03/17/11 2145 03/17/11 1712 03/17/11 1209 03/17/11 0759  GLUCAP 118* 129* 148* 100* 153*     Micro Results: Recent Results (from the past 240 hour(s))  CULTURE, BLOOD (ROUTINE X 2)     Status: Normal (Preliminary result)   Collection Time   03/15/11   2:30 PM      Component Value Range Status Comment   Specimen Description BLOOD   Final    Special Requests     Final    Value: BOTTLES DRAWN AEROBIC AND ANAEROBIC 10CC RIGHT IJ CATHETER   Setup Time [id number]   Final    Culture     Final    Value: YEAST     Note: Gram Stain Report Called to,Read Back By and Verified With: JENAE MALONE @ 1030 03/16/11 WICKN   Report Status PENDING   Incomplete   CULTURE, BLOOD (ROUTINE X 2)     Status: Normal (Preliminary result)   Collection Time   03/15/11  3:20 PM      Component Value Range Status Comment   Specimen Description BLOOD HEMODIALYSIS CATHETER   Final    Special Requests BOTTLES DRAWN AEROBIC AND ANAEROBIC 10CC   Final    Setup Time [id number]   Final    Culture     Final    Value:        BLOOD CULTURE RECEIVED NO GROWTH TO DATE CULTURE WILL BE HELD FOR 5 DAYS BEFORE ISSUING A FINAL NEGATIVE REPORT   Report Status PENDING   Incomplete   PATHOLOGIST SMEAR REVIEW     Status: Normal   Collection  Time   03/15/11  9:53 PM      Component Value Range Status Comment   Tech Review     Final    Value: MILD ABSOLUTE NEUTROPHILIC LEUKOCYTOSIS WITH NO LEFT SHIFT.    Studies/Results: Dg Toe Great Right  03/01/2011  *RADIOLOGY REPORT*  Clinical Data: Open wound.  RIGHT TOE - 2+ VIEW  Comparison: None  Findings: Soft tissue thickening and irregularity noted.  There is destructive bony change involving the distal tuft region of the distal phalanx suggesting osteomyelitis.  Joint spaces are maintained.  IMPRESSION: Plain film findings suspicious for osteomyelitis involving the distal tuft region of the distal phalanx.  Original Report Authenticated By: P. Loralie Champagne, M.D.    Medications: Scheduled Meds:    . amLODipine  5 mg Oral Daily  . calcium acetate  2,001 mg Oral TID WC  . darbepoetin (ARANESP) injection - DIALYSIS  60 mcg Intravenous Q Tue-HD  . famotidine (PEPCID) IV  20 mg Intravenous Q24H  . fentaNYL  250 mcg Intravenous Once    . heparin subcutaneous  5,000 Units Subcutaneous Q8H  . insulin aspart  0-20 Units Subcutaneous TID WC  . insulin aspart  0-5 Units Subcutaneous QHS  . insulin glargine  15 Units Subcutaneous QHS  . micafungin [facility]) IV  100 mg Intravenous Q24H  . midazolam  10 mg Intravenous Once  . paricalcitol  2 mcg Intravenous Q T,Th,Sa-HD  . sodium chloride  3 mL Intravenous Q12H   Continuous Infusions:    . sodium chloride    . sodium chloride 250 mL (03/18/11 0949)   PRN Meds:.sodium chloride, sodium chloride, acetaminophen, benzocaine, camphor-menthol, diphenhydrAMINE, diphenhydrAMINE, docusate sodium, feeding supplement (NEPRO CARB STEADY), heparin, heparin, heparin, heparin, HYDROmorphone, hydrOXYzine, lidocaine, lidocaine-prilocaine, ondansetron (ZOFRAN) IV, ondansetron, oxyCODONE, pentafluoroprop-tetrafluoroeth, promethazine, promethazine, senna-docusate, sodium chloride, sorbitol, zolpidem  TEE per Dr. Shirlee Latch 03/18/2011: Brief outline of findings (see note in echo section for full details):  No evidence for endocarditis.  LV EF 55-60%.  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 - Continue Benadryl, Pepcid.  - TEE done today, negative for endocarditis  Yeast bacteremia: 2/2  - Right PermCath removed yesterday - Repeat 2 sets of blood cultures done on 03/16/2011 results pending, started on micafungin, ID following  - Discussed with ID (Dr. Algis Liming) today, await on sensitivities for the blood cultures if patient has Candida. Patient needs an micafungin, she will require PICC line (IJ PICC preferred renal service/Dr. Allena Katz) or by mouth antifungal agent. - TEE done today negative for endocarditis - Amputation site seen by Dr.  Lajoyce Corners, well healing, no signs of infection coming from the foot.  .Hyperkalemia: Resolved after the hemodialysis  .Diabetes mellitus: Blood sugars controlled  .HTN (hypertension): Continue Norvasc  .ESRD (end stage renal disease) on dialysis: On HD Tuesday, Thursday and Saturday, renal following, to use AVF for tomorrow HD   .Secondary hyperparathyroidism (of renal origin): Per renal   .Anemia of chronic disease: Per renal service, hemoglobin at baseline   .Diabetic foot ulcer/ Osteomyelitis: Status post recent right great toe amputation  DVT prophylaxis:  heparin subcutaneous for DVT prophylaxis, continue to monitor platelets.  [place] Churchill 03/18/2011, 10:42 AM

## 2011-03-18 NOTE — Progress Notes (Signed)
Subjective:  Feeling better ,Objective Vital signs in last 24 hours: Filed Vitals:   03/17/11 0930 03/17/11 1800 03/17/11 2148 03/18/11 0534  BP: 123/84 124/76 119/67 152/76  Pulse: 75 70 68 67  Temp: 97.9 F (36.6 C) 97.5 F (36.4 C) 98.7 F (37.1 C) 97.8 F (36.6 C)  TempSrc: Oral Oral Oral Oral  Resp: 18 18 20 19   Height:   5\' 3"  (1.6 m)   Weight:   90.9 kg (200 lb 6.4 oz)   SpO2: 94% 96% 98% 95%   Weight change: 1.5 kg (3 lb 4.9 oz)  Intake/Output Summary (Last 24 hours) at 03/18/11 0934 Last data filed at 03/18/11 0500  Gross per 24 hour  Intake    620 ml  Output      0 ml  Net    620 ml   Labs: Basic Metabolic Panel:  Lab 03/18/11 2956 03/16/11 0630 03/15/11 0944 03/15/11 0832  NA 134* 133* -- 131*  K 5.0 4.8 6.8* --  CL 94* 95* -- 92*  CO2 22 21 -- 21  GLUCOSE 109* 222* -- 110*  BUN 75* 36* -- 66*  CREATININE 8.13* 5.28* -- 8.75*  CALCIUM 8.0* 8.8 -- 8.1*  ALB -- -- -- --  PHOS 8.4* 7.7* -- --   Liver Function Tests:  Lab 03/18/11 0701 03/16/11 0630 03/15/11 2153  AST -- -- 18  ALT -- -- 11  ALKPHOS -- -- 299*  BILITOT -- -- 1.3*  PROT -- -- 9.8*  ALBUMIN 3.0* 3.3* 4.0   No results found for this basename: LIPASE:3,AMYLASE:3 in the last 168 hours No results found for this basename: AMMONIA:3 in the last 168 hours CBC:  Lab 03/18/11 0701 03/16/11 0500 03/15/11 2153 03/15/11 0832  WBC 6.7 8.8 -- 6.9  NEUTROABS -- -- -- --  HGB 10.0* 11.1* -- 10.9*  HCT 31.1* 34.1* -- 33.4*  MCV 85.9 86.5 -- 86.1  PLT 171 170 182 --   Cardiac Enzymes: No results found for this basename: CKTOTAL:5,CKMB:5,CKMBINDEX:5,TROPONINI:5 in the last 168 hours CBG:  Lab 03/18/11 0746 03/17/11 2145 03/17/11 1712 03/17/11 1209 03/17/11 0759  GLUCAP 118* 129* 148* 100* 153*    Iron Studies: No results found for this basename: IRON,TIBC,TRANSFERRIN,FERRITIN in the last 72 hours Studies/Results: No results found. Medications:    . sodium chloride    . sodium chloride         . amLODipine  5 mg Oral Daily  . darbepoetin (ARANESP) injection - DIALYSIS  60 mcg Intravenous Q Tue-HD  . famotidine (PEPCID) IV  20 mg Intravenous Q24H  . fentaNYL  250 mcg Intravenous Once  . heparin subcutaneous  5,000 Units Subcutaneous Q8H  . insulin aspart  0-20 Units Subcutaneous TID WC  . insulin aspart  0-5 Units Subcutaneous QHS  . insulin glargine  15 Units Subcutaneous QHS  . micafungin [facility]) IV  100 mg Intravenous Q24H  . midazolam  10 mg Intravenous Once  . paricalcitol  2 mcg Intravenous Q T,Th,Sa-HD  . sodium chloride  3 mL Intravenous Q12H  . DISCONTD: heparin subcutaneous  5,000 Units Subcutaneous Q8H    I  have reviewed scheduled and prn medications.  Physical Exam: General:Alert, More comfortable Heart:RRR Lungs:CTA Bilat. Abdomen:BS nl, Soft NT Extremities: Dialysis Access:  Left u a avf pos bruit. Trace pedal edema    Problem/Plan: 1.Yeast pos. bld cultures, just went for TEE,rash improving with therapy, [facility] not able to give iv micafungin as dw charge rn  at center. 2. ESRD -Nl outpt.tts at [facility], at hosp  Today. 3. Anemia - on aranesp, hgb stable 4. Secondary hyperparathyroidism -restart Phoslo with phos high, om Zemplar  5. HTN/volume - stable for her, hd again tomorrow may be needed with her large wt  Gains. 6.pvd. Sp lbka and rt toe amp. Stable per Dr. Lajoyce Corners 7 DM on insulin  Lenny Pastel, PA-C [facility] [phone] 03/18/2011,9:34 AM  LOS: 3 days   I have seen and examined this patient and agree with plan  As stated above by [facility] PA. Plans for HD today as she gains large amounts inter-dialytically. TEE negative for endocarditis. Anticipate at least 4 weeks of micafungin upon discharge by home health nursing (Discussed with Dr.Rai of [facility]) Theodora Lalanne K.,MD 03/18/2011 12:47 PM

## 2011-03-18 NOTE — Progress Notes (Signed)
Skin biopsy consistent with Leukocytoclastic vasculitis with extensive fibrinoid necrosis and large number of neutrophils.Similar findings could be seen in septic vasculitis and so would avoid corticosteroids.  Agree with management at this time and will sign off. Please call if we can be of assistance.

## 2011-03-18 NOTE — Progress Notes (Signed)
03/18/11 1856 nsg  [facility] lab called for positive yeast on anearobic bottle collected 11/21. Paged results to dr. Isidoro Donning. Eero Dini rn

## 2011-03-18 NOTE — Progress Notes (Signed)
  Echocardiogram Echocardiogram Transesophageal has been performed.  Dorena Cookey 03/18/2011, 1:32 PM

## 2011-03-18 NOTE — Progress Notes (Signed)
Clinical social work received referral for pt requesting medicaid application. Pt provided with medicaid application and how to apply.  .No further Clinical Social Work needs, signing off.   Catha Gosselin, Theresia Majors  [phone] 03/16/2011  16:30

## 2011-03-18 NOTE — Progress Notes (Signed)
03/18/11 1830  Dr Harlon Flor called to say that he will see this patient in the morning, and to have eye drops prior to his arrival in the am (he will call to obtain and have staff administer the eye drops so that the patient will be ready when he arrives.)  The eye drops that he wants to have are phenylephrine 2.5% and mydriacyl 1% available at bedside.  Attempted to place telephone order but was not able to at this time - will re-eval in the am.  Continue to monitor.  King Pinzon rn

## 2011-03-18 NOTE — Progress Notes (Signed)
Physical Therapy Evaluation Patient Details Name: [name] MRN: [id number] DOB: 07-Jun-1960 Today's Date: 03/18/2011  Problem List:  Patient Active Problem List  Diagnoses  . Diabetic foot ulcer  . Diabetes mellitus  . HTN (hypertension)  . Anemia  . ESRD (end stage renal disease) on dialysis  . Obesities, morbid  . Osteomyelitis  . Secondary hyperparathyroidism (of renal origin)  . Anemia of chronic disease  . Complication of vascular access for dialysis  . Purpura  . Hyperkalemia  . Blood infection  . UTI (urinary tract infection)    Past Medical History:  Past Medical History  Diagnosis Date  . Hypertension   . Anemia   . Diabetes mellitus   . Renal failure   . Peripheral vascular disease   . Hemodialysis patient 03/15/11    "Tues; Thurs; Sat; [facility]"  . Blood transfusion   . Arthritis     right knee  . Chronic back pain   . Rash 03/15/11    "admitted me to find out what its' from; UE, waist, thighs, groin"   Past Surgical History:  Past Surgical History  Procedure Date  . Insertion of dialysis catheter 07/29/10    right subclavian  . Arteriovenous graft placement 10/01/10    left upper arm  . Below knee leg amputation 2004    left  . Amputation 03/04/2011    Procedure: AMPUTATION DIGIT;  Surgeon: [name], MD;  Location: [facility] OR;  Service: Orthopedics;  Laterality: Right;  RT GREAT TOE AMP  . Cataract extraction w/ intraocular lens implant 12/2009    right eye  . Carpal tunnel release ~ 2009    left wrist    PT Assessment/Plan/Recommendation  Pt refusing PT evaluation/treatment today as she reports no difficulty walking around in room. No c/o loss of balance, compliant with donning boot. PT will sign off, please reorder if needed. Thank you!   Sherie Don 03/18/2011, 8:14 AM  Sherie Don) Carleene Mains PT, DPT Acute Rehabilitation [phone]

## 2011-03-19 ENCOUNTER — Encounter (HOSPITAL_COMMUNITY)
Admission: EM | Disposition: A | Discharge status: Home / Self Care | Payer: Self-pay | Source: Home / Self Care | Attending: Internal Medicine

## 2011-03-19 ENCOUNTER — Inpatient Hospital Stay (HOSPITAL_COMMUNITY): Payer: Medicare Other

## 2011-03-19 DIAGNOSIS — B49 Unspecified mycosis: Secondary | ICD-10-CM | POA: Diagnosis present

## 2011-03-19 DIAGNOSIS — A4189 Other specified sepsis: Secondary | ICD-10-CM | POA: Diagnosis present

## 2011-03-19 LAB — CBC
MCH: 27.8 pg (ref 26.0–34.0)
MCHC: 32 g/dL (ref 30.0–36.0)
Platelets: 169 10*3/uL (ref 150–400)
RDW: 15.4 % (ref 11.5–15.5)

## 2011-03-19 LAB — GLUCOSE, CAPILLARY
Glucose-Capillary: 120 mg/dL — ABNORMAL HIGH (ref 70–99)
Glucose-Capillary: 221 mg/dL — ABNORMAL HIGH (ref 70–99)

## 2011-03-19 LAB — BASIC METABOLIC PANEL
Calcium: 8.2 mg/dL — ABNORMAL LOW (ref 8.4–10.5)
Creatinine, Ser: 6.35 mg/dL — ABNORMAL HIGH (ref 0.50–1.10)
GFR calc Af Amer: 8 mL/min — ABNORMAL LOW (ref >90)

## 2011-03-19 SURGERY — ECHOCARDIOGRAM, TRANSESOPHAGEAL
Anesthesia: Moderate Sedation

## 2011-03-19 MED ORDER — RENA-VITE PO TABS
1.0000 | ORAL_TABLET | Freq: Every day | ORAL | Status: DC
  Administered 2011-03-19 – 2011-03-21 (×3): 1 via ORAL
  Filled 2011-03-19 (×3): qty 1

## 2011-03-19 MED ORDER — TROPICAMIDE 1 % OP SOLN
3.0000 [drp] | Freq: Once | OPHTHALMIC | Status: AC
  Administered 2011-03-19: 3 [drp] via OPHTHALMIC
  Filled 2011-03-19: qty 2

## 2011-03-19 MED ORDER — INSULIN GLARGINE 100 UNIT/ML ~~LOC~~ SOLN
18.0000 [IU] | Freq: Every day | SUBCUTANEOUS | Status: DC
  Administered 2011-03-19 – 2011-03-20 (×2): 18 [IU] via SUBCUTANEOUS

## 2011-03-19 MED ORDER — DARBEPOETIN ALFA-POLYSORBATE 200 MCG/0.4ML IJ SOLN
200.0000 ug | INTRAMUSCULAR | Status: DC
  Administered 2011-03-19: 200 ug via INTRAVENOUS
  Filled 2011-03-19: qty 0.4

## 2011-03-19 MED ORDER — PHENYLEPHRINE HCL 2.5 % OP SOLN
3.0000 [drp] | Freq: Once | OPHTHALMIC | Status: AC
  Administered 2011-03-19: 3 [drp] via OPHTHALMIC
  Filled 2011-03-19: qty 2

## 2011-03-19 MED ORDER — PARICALCITOL 5 MCG/ML IV SOLN
INTRAVENOUS | Status: AC
  Filled 2011-03-19: qty 1

## 2011-03-19 MED ORDER — DARBEPOETIN ALFA-POLYSORBATE 200 MCG/0.4ML IJ SOLN
INTRAMUSCULAR | Status: AC
  Administered 2011-03-19: 200 ug via INTRAVENOUS
  Filled 2011-03-19: qty 0.4

## 2011-03-19 MED ORDER — AMLODIPINE BESYLATE 10 MG PO TABS
10.0000 mg | ORAL_TABLET | Freq: Every day | ORAL | Status: DC
  Filled 2011-03-19: qty 1

## 2011-03-19 MED ORDER — AMLODIPINE BESYLATE 10 MG PO TABS
10.0000 mg | ORAL_TABLET | Freq: Every day | ORAL | Status: DC
  Administered 2011-03-19 – 2011-03-20 (×2): 10 mg via ORAL
  Filled 2011-03-19 (×3): qty 1

## 2011-03-19 NOTE — Progress Notes (Signed)
Patient ID: [name], female   DOB: Feb 18, 1961, 50 y.o.   MRN: [id number] I was present at this session.  I have reviewed the session itself and made appropriate changes.  [name] L 11/24/20129:52 AM

## 2011-03-19 NOTE — Consult Note (Signed)
Reason for Consult: Fungal septicemia attending physician requests ophthalmology consult to determine if fungal septicemia and involves the eyes Referring Physician:   [name] is an 50 y.o. female.  HPI: This 50 year old white female was admitted to the hospital and noted to have a fungal septicemia suspected at the site of her hemodialysis. The patient has a history of renal failure diabetes hypertension. On examination she states that she just wears reading glasses her last eye exam was a little over one year ago. She had cataract surgery right eye in [facility]  Past Medical History  Diagnosis Date  . Hypertension   . Anemia   . Diabetes mellitus   . Peripheral vascular disease   . Hemodialysis patient 03/15/11    "Tues; Thurs; Sat; [facility]"  . Blood transfusion   . Chronic back pain   . Rash 03/15/11    "admitted me to find out what its' from; UE, waist, thighs, groin"  . Renal failure     hd 4/12    Past Surgical History  Procedure Date  . Insertion of dialysis catheter 07/29/10    right subclavian  . Arteriovenous graft placement 10/01/10    left upper arm  . Below knee leg amputation 2004    left  . Amputation 03/04/2011    Procedure: AMPUTATION DIGIT;  Surgeon: [name], MD;  Location: [facility] OR;  Service: Orthopedics;  Laterality: Right;  RT GREAT TOE AMP  . Cataract extraction w/ intraocular lens implant 12/2009    right eye  . Carpal tunnel release ~ 2009    left wrist    History reviewed. No pertinent family history.  Social History:  reports that she has never smoked. She has never used smokeless tobacco. She reports that she does not drink alcohol or use illicit drugs.  Allergies:  Allergies  Allergen Reactions  . Codeine   . Levaquin   . Penicillins     Medications: I have reviewed the patient's current medications.  Results for orders placed during the hospital encounter of 03/15/11 (from the past 48  hour(s))  GLUCOSE, CAPILLARY     Status: Abnormal   Collection Time   03/17/11  5:12 PM      Component Value Range Comment   Glucose-Capillary 148 (*) 70 - 99 (mg/dL)    Comment 1 Notify RN      Comment 2 Documented in Chart     GLUCOSE, CAPILLARY     Status: Abnormal   Collection Time   03/17/11  9:45 PM      Component Value Range Comment   Glucose-Capillary 129 (*) 70 - 99 (mg/dL)   RENAL FUNCTION PANEL     Status: Abnormal   Collection Time   03/18/11  7:01 AM      Component Value Range Comment   Sodium 134 (*) 135 - 145 (mEq/L)    Potassium 5.0  3.5 - 5.1 (mEq/L)    Chloride 94 (*) 96 - 112 (mEq/L)    CO2 22  19 - 32 (mEq/L)    Glucose, Bld 109 (*) 70 - 99 (mg/dL)    BUN 75 (*) 6 - 23 (mg/dL)    Creatinine, Ser 3.08 (*) 0.50 - 1.10 (mg/dL)    Calcium 8.0 (*) 8.4 - 10.5 (mg/dL)    Phosphorus 8.4 (*) 2.3 - 4.6 (mg/dL)    Albumin 3.0 (*) 3.5 - 5.2 (g/dL)    GFR calc non Af Amer 5 (*) >90 (mL/min)  GFR calc Af Amer 6 (*) >90 (mL/min)   CBC     Status: Abnormal   Collection Time   03/18/11  7:01 AM      Component Value Range Comment   WBC 6.7  4.0 - 10.5 (K/uL)    RBC 3.62 (*) 3.87 - 5.11 (MIL/uL)    Hemoglobin 10.0 (*) 12.0 - 15.0 (g/dL)    HCT 04.5 (*) 40.9 - 46.0 (%)    MCV 85.9  78.0 - 100.0 (fL)    MCH 27.6  26.0 - 34.0 (pg)    MCHC 32.2  30.0 - 36.0 (g/dL)    RDW 81.1  91.4 - 78.2 (%)    Platelets 171  150 - 400 (K/uL)   GLUCOSE, CAPILLARY     Status: Abnormal   Collection Time   03/18/11  7:46 AM      Component Value Range Comment   Glucose-Capillary 118 (*) 70 - 99 (mg/dL)   GLUCOSE, CAPILLARY     Status: Abnormal   Collection Time   03/18/11 12:53 PM      Component Value Range Comment   Glucose-Capillary 106 (*) 70 - 99 (mg/dL)   GLUCOSE, CAPILLARY     Status: Abnormal   Collection Time   03/18/11  6:38 PM      Component Value Range Comment   Glucose-Capillary 154 (*) 70 - 99 (mg/dL)   GLUCOSE, CAPILLARY     Status: Abnormal   Collection Time    03/18/11  9:44 PM      Component Value Range Comment   Glucose-Capillary 194 (*) 70 - 99 (mg/dL)   BASIC METABOLIC PANEL     Status: Abnormal   Collection Time   03/19/11  7:30 AM      Component Value Range Comment   Sodium 134 (*) 135 - 145 (mEq/L)    Potassium 3.9  3.5 - 5.1 (mEq/L)    Chloride 97  96 - 112 (mEq/L)    CO2 21  19 - 32 (mEq/L)    Glucose, Bld 180 (*) 70 - 99 (mg/dL)    BUN 51 (*) 6 - 23 (mg/dL)    Creatinine, Ser 9.56 (*) 0.50 - 1.10 (mg/dL)    Calcium 8.2 (*) 8.4 - 10.5 (mg/dL)    GFR calc non Af Amer 7 (*) >90 (mL/min)    GFR calc Af Amer 8 (*) >90 (mL/min)   CBC     Status: Abnormal   Collection Time   03/19/11  7:30 AM      Component Value Range Comment   WBC 5.8  4.0 - 10.5 (K/uL)    RBC 3.53 (*) 3.87 - 5.11 (MIL/uL)    Hemoglobin 9.8 (*) 12.0 - 15.0 (g/dL)    HCT 21.3 (*) 08.6 - 46.0 (%)    MCV 86.7  78.0 - 100.0 (fL)    MCH 27.8  26.0 - 34.0 (pg)    MCHC 32.0  30.0 - 36.0 (g/dL)    RDW 57.8  46.9 - 62.9 (%)    Platelets 169  150 - 400 (K/uL)   GLUCOSE, CAPILLARY     Status: Abnormal   Collection Time   03/19/11 12:01 PM      Component Value Range Comment   Glucose-Capillary 120 (*) 70 - 99 (mg/dL)     No results found.  ROS Blood pressure 137/68, pulse 77, temperature 97.9 F (36.6 C), temperature source Oral, resp. rate 16, height 5\' 3"  (1.6 m), weight 85.1 kg (187 lb  9.8 oz), SpO2 97.00%. Physical Exam On examination visual acuity right eye 20/60 left eye 20/60 External exam eyelids normal conjunctiva quiet Slit-lamp exam was not possible patient was in the bed receiving intravenous antibiotics The patient's pupils were dilated Motility is full versions orthophoria Cornea clear lens clear Fundus examination vitreous clear of both eyes Retina right eye retina was flat and attached there were peripheral blot hemorrhage noted in the right eye vessels narrow arterioles optic nerve 45% cupping was shot and this margined macula appeared to be  clear there were small microaneurysms noted Left eye revealed vitreous clear retina flat and attached small microaneurysms optic nerve slight discoloration 45% cupping sharp this margined macula clear vessels narrow arterioles and there was no evidence of fungus ball in either eye. Assessment/Plan: #1 blood culture reveals fundal septicemia with no evidence of fungal disease in the eyes there is no evidence of fungal ball in the vitreous or the retina. 2 diabetic retinopathy nonproliferative 3 hypertensive retinopathy Plan: After discharge from the hospital the patient should have a followup examination she may need eyeglasses or treatment for diabetic eye disease. Thank you for allow me to consult on this patient.  [name] 03/19/2011, 4:36 PM

## 2011-03-19 NOTE — Progress Notes (Signed)
Subjective: No specific complaints by the patient herself, rash improving. Seen in HD. Repeat blood cultures from 03/16/2011 also positive for yeast.  Objective: Weight change: 1.7 kg (3 lb 12 oz)  Intake/Output Summary (Last 24 hours) at 03/19/11 1610 Last data filed at 03/18/11 1900  Gross per 24 hour  Intake    580 ml  Output   1906 ml  Net  -1326 ml   Blood pressure 160/80, pulse 76, temperature 98 F (36.7 C), temperature source Oral, resp. rate 18, height 5\' 3"  (1.6 m), weight 90 kg (198 lb 6.6 oz), SpO2 99.00%.  Physical Exam: General: Alert and awake, oriented x3, not in any acute distress. HEENT: anicteric sclera, pupils reactive to light and accommodation, EOMI CVS: S1-S2 clear, no murmur rubs or gallops Chest: clear to auscultation bilaterally, no wheezing, rales or rhonchi Abdomen: soft nontender, nondistended, normal bowel sounds, no organomegaly Extremities: Left BKA Skin: non-blanchable and dark red/purplish, purpuric rash on both thighs extending into the stump of the left BKA and on the right leg, left flank/back area, left arm, right arm, no weeping discharge, IMPROVING    Lab Results: Basic Metabolic Panel:  Lab 03/19/11 9604 03/18/11 0701  NA 134* 134*  K 3.9 5.0  CL 97 94*  CO2 21 22  GLUCOSE 180* 109*  BUN 51* 75*  CREATININE 6.35* 8.13*  CALCIUM 8.2* 8.0*  MG -- --  PHOS -- 8.4*   Liver Function Tests:  Lab 03/18/11 0701 03/16/11 0630 03/15/11 2153  AST -- -- 18  ALT -- -- 11  ALKPHOS -- -- 299*  BILITOT -- -- 1.3*  PROT -- -- 9.8*  ALBUMIN 3.0* 3.3* --   CBC:  Lab 03/19/11 0730 03/18/11 0701  WBC 5.8 6.7  NEUTROABS -- --  HGB 9.8* 10.0*  HCT 30.6* 31.1*  MCV 86.7 85.9  PLT 169 171    CBG:  Lab 03/18/11 2144 03/18/11 1838 03/18/11 1253 03/18/11 0746 03/17/11 2145  GLUCAP 194* 154* 106* 118* 129*     Microbiology: Lab Results  Component Value Date   CULT  Value: YEAST Note: Gram Stain Report Called to,Read Back By and  Verified With: DENISE BEASON @ 2147 ON 03/18/11 BY GOLLD 03/16/2011   CULT  Value: YEAST Note: Gram Stain Report Called to,Read Back By and Verified With: Theodis Blaze @ 1826 ON 03/18/11 BY GOLLD 03/16/2011   CULT Multiple bacterial morphotypes present, none predominant. Suggest appropriate recollection if clinically indicated. 03/16/2011   CULT  Value: YEAST Note: Gram Stain Report Called to,Read Back By and Verified With: TINA ISAACS @ 0044 ON 03/17/11 BY GOLLD 03/15/2011   CULT  Value: YEAST Note: Gram Stain Report Called to,Read Back By and Verified With: JENAE MALONE @ 1030 03/16/11 WICKN 03/15/2011    Lab 03/16/11 1210 03/16/11 1205  CULT YEAST Note: Gram Stain Report Called to,Read Back By and Verified With: DENISE BEASON @ 2147 ON 03/18/11 BY GOLLD YEAST Note: Gram Stain Report Called to,Read Back By and Verified With: Theodis Blaze @ 1826 ON 03/18/11 BY GOLLD  SDES BLOOD RIGHT ARM BLOOD RIGHT ARM      Studies/Results: Dg Toe Great Right  03/01/2011  *RADIOLOGY REPORT*  Clinical Data: Open wound.  RIGHT TOE - 2+ VIEW  Comparison: None  Findings: Soft tissue thickening and irregularity noted.  There is destructive bony change involving the distal tuft region of the distal phalanx suggesting osteomyelitis.  Joint spaces are maintained.  IMPRESSION: Plain film findings suspicious for osteomyelitis involving the  distal tuft region of the distal phalanx.  Original Report Authenticated By: P. Loralie Champagne, M.D.    Medications: Scheduled Meds:    . sodium chloride   Intravenous Once  . amLODipine  10 mg Oral Daily  . calcium acetate  2,001 mg Oral TID WC  . darbepoetin (ARANESP) injection - DIALYSIS  60 mcg Intravenous Q Tue-HD  . famotidine (PEPCID) IV  20 mg Intravenous Q24H  . fentaNYL  250 mcg Intravenous Once  . heparin subcutaneous  5,000 Units Subcutaneous Q8H  . insulin aspart  0-20 Units Subcutaneous TID WC  . insulin aspart  0-5 Units Subcutaneous QHS  . insulin  glargine  15 Units Subcutaneous QHS  . micafungin [facility]) IV  100 mg Intravenous Q24H  . midazolam  10 mg Intravenous Once  . paricalcitol  2 mcg Intravenous Q T,Th,Sa-HD  . sodium chloride  3 mL Intravenous Q12H  . DISCONTD: amLODipine  5 mg Oral Daily   Continuous Infusions:    . sodium chloride    . sodium chloride 250 mL (03/18/11 0949)   PRN Meds:.sodium chloride, sodium chloride, acetaminophen, benzocaine, camphor-menthol, diphenhydrAMINE, diphenhydrAMINE, docusate sodium, feeding supplement (NEPRO CARB STEADY), heparin, heparin, heparin, heparin, HYDROmorphone, hydrOXYzine, lidocaine, lidocaine-prilocaine, ondansetron (ZOFRAN) IV, ondansetron, oxyCODONE, pentafluoroprop-tetrafluoroeth, promethazine, promethazine, senna-docusate, sodium chloride, sorbitol, zolpidem, DISCONTD: fentaNYL DISCONTD: midazolam  TEE per Dr. Shirlee Latch 03/18/2011: Brief outline of findings (see note in echo section for full details):  No evidence for endocarditis.  LV EF 55-60%.  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 No steroids per derm (Dr Yetta Barre) rec's. - Continue Benadryl, Pepcid.  - TEE done negative for endocarditis  Yeast bacteremia: 2/2 , repeat blood cultures done on 03/16/2011 also positive for yeast - Right PermCath removed  - on micafungin, ID following, await on sensitivities for the blood cultures. I ordered 2 more sets of blood cultures today - TEE done negative for endocarditis - Amputation site seen by Dr. Lajoyce Corners, well healing, no signs of infection coming from the foot. - I discussed with Dr. Harlon Flor (ophthalmology) for a funduscopic exam yesterday, await recommendations.  .Hyperkalemia: Resolved after the hemodialysis  .Diabetes mellitus: Blood sugars somewhat  elevated, increase Lantus to 18 units   .HTN (hypertension): BP on elevated side, increase Norvasc  .ESRD (end stage renal disease) on dialysis: On HD Tuesday, Thursday and Saturday, renal following    .Secondary hyperparathyroidism (of renal origin): Per renal   .Anemia of chronic disease: Per renal service, hemoglobin at baseline   .Diabetic foot ulcer/ Osteomyelitis: Status post recent right great toe amputation, wound clean and healing.  DVT prophylaxis:  heparin subcutaneous for DVT prophylaxis, continue to monitor platelets.  RAI,RIPUDEEP 03/19/2011, 9:27 AM

## 2011-03-19 NOTE — Progress Notes (Signed)
Subjective: Interval History: none.  Objective: Vital signs in last 24 hours: Temp:  [96.4 F (35.8 C)-98.7 F (37.1 C)] 98 F (36.7 C) (11/24 0650) Pulse Rate:  [63-78] 74  (11/24 0930) Resp:  [12-25] 18  (11/24 0930) BP: (118-176)/(53-100) 154/76 mmHg (11/24 0930) SpO2:  [94 %-100 %] 99 % (11/24 0650) Weight:  [90 kg (198 lb 6.6 oz)-92.6 kg (204 lb 2.3 oz)] 198 lb 6.6 oz (90 kg) (11/24 0650) Weight change: 1.7 kg (3 lb 12 oz)  Intake/Output from previous day: 11/23 0701 - 11/24 0700 In: 580 [P.O.:480; IV Piggyback:100] Out: 1906  Intake/Output this shift:    General appearance: alert, cooperative and moderately obese Resp: clear to auscultation bilaterally Cardio: regular rate and rhythm and systolic murmur: holosystolic 2/6, blowing at apex GI: Obese, liver down 4 cm Extremities: L BKA, LUA AVF Skin: excoriation - scattered, papular - generalized, petechiae - generalized and Papullar rash fadeing.    Lab Results:  [facility] 03/19/11 0730 03/18/11 0701  WBC 5.8 6.7  HGB 9.8* 10.0*  HCT 30.6* 31.1*  PLT 169 171   BMET:  Basename 03/19/11 0730 03/18/11 0701  NA 134* 134*  K 3.9 5.0  CL 97 94*  CO2 21 22  GLUCOSE 180* 109*  BUN 51* 75*  CREATININE 6.35* 8.13*  CALCIUM 8.2* 8.0*   No results found for this basename: PTH:2 in the last 72 hours Iron Studies: No results found for this basename: IRON,TIBC,TRANSFERRIN,FERRITIN in the last 72 hours  Studies/Results: No results found.  I have reviewed the patient's current medications.  Assessment/Plan: 1 ESRD vol xs using AVF 2DM fair control 3 Fungal sepsis on Mica 4 PVD stable  5 Anemia ^ EPO 6 Obesity. 7HPTH P cental line for Mica at home.  HD, , vol control, use AVF    LOS: 4 days   Hibba Schram L 03/19/2011,10:04 AM

## 2011-03-19 NOTE — Progress Notes (Signed)
ANTIBIOTIC CONSULT NOTE - FOLLOW UP  Pharmacy Consult for Micafungin Indication: Fungemia  Allergies  Allergen Reactions  . Codeine   . Levaquin   . Penicillins     Patient Measurements: Height: 5\' 3"  (160 cm) Weight: 198 lb 6.6 oz (90 kg) IBW/kg (Calculated) : 52.4  Adjusted Body Weight:   Vital Signs: Temp: 98 F (36.7 C) (11/24 0650) Temp src: Oral (11/24 0650) BP: 150/76 mmHg (11/24 0830) Pulse Rate: 73  (11/24 0830) Intake/Output from previous day: 11/23 0701 - 11/24 0700 In: 580 [P.O.:480; IV Piggyback:100] Out: 1906  Intake/Output from this shift:    Labs:  [facility] 03/19/11 0730 03/18/11 0701  WBC 5.8 6.7  HGB 9.8* 10.0*  PLT 169 171  LABCREA -- --  CREATININE 6.35* 8.13*   Estimated Creatinine Clearance: 11.5 ml/min (by C-G formula based on Cr of 6.35). No results found for this basename: VANCOTROUGH:2,VANCOPEAK:2,VANCORANDOM:2,GENTTROUGH:2,GENTPEAK:2,GENTRANDOM:2,TOBRATROUGH:2,TOBRAPEAK:2,TOBRARND:2,AMIKACINPEAK:2,AMIKACINTROU:2,AMIKACIN:2, in the last 72 hours   Microbiology: Recent Results (from the past 720 hour(s))  CULTURE, BLOOD (ROUTINE X 2)     Status: Normal   Collection Time   03/01/11  5:55 PM      Component Value Range Status Comment   Specimen Description BLOOD ARM RIGHT   Final    Special Requests BOTTLES DRAWN AEROBIC ONLY 3CC   Final    Setup Time [id number]   Final    Culture NO GROWTH 5 DAYS   Final    Report Status 03/07/2011 FINAL   Final   CULTURE, BLOOD (ROUTINE X 2)     Status: Normal   Collection Time   03/01/11  6:05 PM      Component Value Range Status Comment   Specimen Description BLOOD ARM RIGHT   Final    Special Requests BOTTLES DRAWN AEROBIC ONLY 3CC LOWER   Final    Setup Time [id number]   Final    Culture NO GROWTH 5 DAYS   Final    Report Status 03/07/2011 FINAL   Final   WOUND CULTURE     Status: Normal   Collection Time   03/01/11  7:01 PM      Component Value Range Status Comment   Specimen Description  WOUND TOE   Final    Special Requests NONE   Final    Gram Stain     Final    Value: ABUNDANT WBC PRESENT, PREDOMINANTLY PMN     RARE SQUAMOUS EPITHELIAL CELLS PRESENT     ABUNDANT GRAM POSITIVE COCCI IN PAIRS     IN CLUSTERS RARE GRAM NEGATIVE RODS   Culture     Final    Value: ABUNDANT STAPHYLOCOCCUS AUREUS     Note: RIFAMPIN AND GENTAMICIN SHOULD NOT BE USED AS SINGLE DRUGS FOR TREATMENT OF STAPH INFECTIONS. This organism DOES NOT demonstrate inducible Clindamycin resistance in vitro.   Report Status 03/04/2011 FINAL   Final    Organism ID, Bacteria STAPHYLOCOCCUS AUREUS   Final   CULTURE, BLOOD (ROUTINE X 2)     Status: Normal (Preliminary result)   Collection Time   03/15/11  2:30 PM      Component Value Range Status Comment   Specimen Description BLOOD   Final    Special Requests     Final    Value: BOTTLES DRAWN AEROBIC AND ANAEROBIC 10CC RIGHT IJ CATHETER   Setup Time [id number]   Final    Culture     Final    Value: YEAST     Note: Gram Stain Report  Called to,Read Back By and Verified With: JENAE MALONE @ 1030 03/16/11 WICKN   Report Status PENDING   Incomplete   CULTURE, BLOOD (ROUTINE X 2)     Status: Normal (Preliminary result)   Collection Time   03/15/11  3:20 PM      Component Value Range Status Comment   Specimen Description BLOOD HEMODIALYSIS CATHETER   Final    Special Requests BOTTLES DRAWN AEROBIC AND ANAEROBIC 10CC   Final    Setup Time [id number]   Final    Culture     Final    Value: YEAST     Note: Gram Stain Report Called to,Read Back By and Verified With: TINA ISAACS @ 0044 ON 03/17/11 BY GOLLD   Report Status PENDING   Incomplete   PATHOLOGIST SMEAR REVIEW     Status: Normal   Collection Time   03/15/11  9:53 PM      Component Value Range Status Comment   Tech Review     Final    Value: MILD ABSOLUTE NEUTROPHILIC LEUKOCYTOSIS WITH NO LEFT SHIFT.  URINE CULTURE     Status: Normal   Collection Time   03/16/11  9:03 AM      Component Value Range  Status Comment   Specimen Description URINE, CLEAN CATCH   Final    Special Requests NONE   Final    Setup Time [id number]   Final    Colony Count 85,000 COLONIES/ML   Final    Culture     Final    Value: Multiple bacterial morphotypes present, none predominant. Suggest appropriate recollection if clinically indicated.   Report Status 03/17/2011 FINAL   Final   CULTURE, BLOOD (ROUTINE X 2)     Status: Normal (Preliminary result)   Collection Time   03/16/11 12:05 PM      Component Value Range Status Comment   Specimen Description BLOOD RIGHT ARM   Final    Special Requests BOTTLES DRAWN AEROBIC AND ANAEROBIC 10CC   Final    Setup Time [id number]   Final    Culture     Final    Value: YEAST     Note: Gram Stain Report Called to,Read Back By and Verified With: Theodis Blaze @ 1826 ON 03/18/11 BY GOLLD   Report Status PENDING   Incomplete   CULTURE, BLOOD (ROUTINE X 2)     Status: Normal (Preliminary result)   Collection Time   03/16/11 12:10 PM      Component Value Range Status Comment   Specimen Description BLOOD RIGHT ARM   Final    Special Requests BOTTLES DRAWN AEROBIC AND ANAEROBIC 10CC   Final    Setup Time [id number]   Final    Culture     Final    Value: YEAST     Note: Gram Stain Report Called to,Read Back By and Verified With: DENISE BEASON @ 2147 ON 03/18/11 BY GOLLD   Report Status PENDING   Incomplete     Medical History: Past Medical History  Diagnosis Date  . Hypertension   . Anemia   . Diabetes mellitus   . Peripheral vascular disease   . Hemodialysis patient 03/15/11    "Tues; Thurs; Sat; [facility]"  . Blood transfusion   . Chronic back pain   . Rash 03/15/11    "admitted me to find out what its' from; UE, waist, thighs, groin"  . Renal failure     hd 4/12  Medications:  Scheduled:     . sodium chloride   Intravenous Once  . amLODipine  5 mg Oral Daily  . calcium acetate  2,001 mg Oral TID WC  . darbepoetin  (ARANESP) injection - DIALYSIS  60 mcg Intravenous Q Tue-HD  . famotidine (PEPCID) IV  20 mg Intravenous Q24H  . fentaNYL  250 mcg Intravenous Once  . heparin subcutaneous  5,000 Units Subcutaneous Q8H  . insulin aspart  0-20 Units Subcutaneous TID WC  . insulin aspart  0-5 Units Subcutaneous QHS  . insulin glargine  15 Units Subcutaneous QHS  . micafungin [facility]) IV  100 mg Intravenous Q24H  . midazolam  10 mg Intravenous Once  . paricalcitol  2 mcg Intravenous Q T,Th,Sa-HD  . sodium chloride  3 mL Intravenous Q12H   Infusions:     . sodium chloride    . sodium chloride 250 mL (03/18/11 0949)   Assessment: 50 yo female with ESRD was admitted for pruritic rash all over her body. Blood cultures from 11/20 and 11/21 with yeast. Pt is currently on D4 micafungin.   Plan:  1. Continue micafungin 100mg  IV Q24H 2. F/u with blood culture speciation.  Emeline Gins 03/19/2011,8:52 AM

## 2011-03-20 LAB — GLUCOSE, CAPILLARY
Glucose-Capillary: 136 mg/dL — ABNORMAL HIGH (ref 70–99)
Glucose-Capillary: 145 mg/dL — ABNORMAL HIGH (ref 70–99)
Glucose-Capillary: 196 mg/dL — ABNORMAL HIGH (ref 70–99)
Glucose-Capillary: 162 mg/dL — ABNORMAL HIGH (ref 70–99)

## 2011-03-20 LAB — CRYOGLOBULIN

## 2011-03-20 NOTE — Progress Notes (Addendum)
Subjective: No specific complaints, rash improving. Repeat blood cultures from 03/16/2011 also positive for yeast. Repeat blood cultures from 03/19/2011 negative so far.  Objective: Weight change: -7.5 kg (-16 lb 8.6 oz)  Intake/Output Summary (Last 24 hours) at 03/20/11 1026 Last data filed at 03/20/11 0900  Gross per 24 hour  Intake    873 ml  Output   4550 ml  Net  -3677 ml   Blood pressure 124/65, pulse 75, temperature 97.6 F (36.4 C), temperature source Oral, resp. rate 18, height 5\' 3"  (1.6 m), weight 85.6 kg (188 lb 11.4 oz), SpO2 100.00%.  Physical Exam: General: Alert and awake, oriented x3, not in any acute distress. HEENT: anicteric sclera, pupils reactive to light and accommodation, EOMI CVS: S1-S2 clear, no murmur rubs or gallops Chest: clear to auscultation bilaterally, no wheezing, rales or rhonchi Abdomen: soft nontender, nondistended, normal bowel sounds, no organomegaly Extremities: Left BKA Skin: non-blanchable and dark red/purplish, purpuric rash on both thighs extending into the stump of the left BKA and on the right leg, left flank/back area, left arm, right arm, no weeping discharge, significantly improved   Lab Results: Basic Metabolic Panel:  Lab 03/19/11 4098 03/18/11 0701  NA 134* 134*  K 3.9 5.0  CL 97 94*  CO2 21 22  GLUCOSE 180* 109*  BUN 51* 75*  CREATININE 6.35* 8.13*  CALCIUM 8.2* 8.0*  MG -- --  PHOS -- 8.4*   Liver Function Tests:  Lab 03/18/11 0701 03/16/11 0630 03/15/11 2153  AST -- -- 18  ALT -- -- 11  ALKPHOS -- -- 299*  BILITOT -- -- 1.3*  PROT -- -- 9.8*  ALBUMIN 3.0* 3.3* --   CBC:  Lab 03/19/11 0730 03/18/11 0701  WBC 5.8 6.7  NEUTROABS -- --  HGB 9.8* 10.0*  HCT 30.6* 31.1*  MCV 86.7 85.9  PLT 169 171    CBG:  Lab 03/20/11 0800 03/19/11 2116 03/19/11 1718 03/19/11 1201 03/18/11 2144  GLUCAP 136* 190* 221* 120* 194*     Microbiology: Lab Results  Component Value Date   CULT  Value: YEAST Note: Gram  Stain Report Called to,Read Back By and Verified With: DENISE BEASON @ 2147 ON 03/18/11 BY GOLLD 03/16/2011   CULT  Value: YEAST Note: Gram Stain Report Called to,Read Back By and Verified With: Theodis Blaze @ 1826 ON 03/18/11 BY GOLLD 03/16/2011   CULT Multiple bacterial morphotypes present, none predominant. Suggest appropriate recollection if clinically indicated. 03/16/2011   CULT  Value: YEAST Note: Gram Stain Report Called to,Read Back By and Verified With: TINA ISAACS @ 0044 ON 03/17/11 BY GOLLD 03/15/2011   CULT  Value: YEAST Note: Gram Stain Report Called to,Read Back By and Verified With: JENAE MALONE @ 1030 03/16/11 WICKN 03/15/2011    Lab 03/16/11 1210 03/16/11 1205  CULT YEAST Note: Gram Stain Report Called to,Read Back By and Verified With: DENISE BEASON @ 2147 ON 03/18/11 BY GOLLD YEAST Note: Gram Stain Report Called to,Read Back By and Verified With: Theodis Blaze @ 1826 ON 03/18/11 BY GOLLD  SDES BLOOD RIGHT ARM BLOOD RIGHT ARM      Studies/Results: Dg Toe Great Right  03/01/2011  *RADIOLOGY REPORT*  Clinical Data: Open wound.  RIGHT TOE - 2+ VIEW  Comparison: None  Findings: Soft tissue thickening and irregularity noted.  There is destructive bony change involving the distal tuft region of the distal phalanx suggesting osteomyelitis.  Joint spaces are maintained.  IMPRESSION: Plain film findings suspicious for osteomyelitis involving  the distal tuft region of the distal phalanx.  Original Report Authenticated By: P. Loralie Champagne, M.D.    Medications: Scheduled Meds:    . amLODipine  10 mg Oral QHS  . calcium acetate  2,001 mg Oral TID WC  . darbepoetin (ARANESP) injection - DIALYSIS  200 mcg Intravenous Q Sat-HD  . famotidine (PEPCID) IV  20 mg Intravenous Q24H  . fentaNYL  250 mcg Intravenous Once  . heparin subcutaneous  5,000 Units Subcutaneous Q8H  . insulin aspart  0-20 Units Subcutaneous TID WC  . insulin aspart  0-5 Units Subcutaneous QHS  . insulin  glargine  18 Units Subcutaneous QHS  . micafungin [facility]) IV  100 mg Intravenous Q24H  . midazolam  10 mg Intravenous Once  . multivitamin  1 tablet Oral Daily  . paricalcitol  2 mcg Intravenous Q T,Th,Sa-HD  . phenylephrine  3 drop Both Eyes Once  . sodium chloride  3 mL Intravenous Q12H  . tropicamide  3 drop Both Eyes Once   Continuous Infusions:    . sodium chloride    . sodium chloride 250 mL (03/18/11 0949)   PRN Meds:.sodium chloride, sodium chloride, acetaminophen, benzocaine, camphor-menthol, diphenhydrAMINE, diphenhydrAMINE, docusate sodium, feeding supplement (NEPRO CARB STEADY), heparin, heparin, heparin, heparin, HYDROmorphone, hydrOXYzine, lidocaine, lidocaine-prilocaine, ondansetron (ZOFRAN) IV, ondansetron, oxyCODONE, pentafluoroprop-tetrafluoroeth, promethazine, promethazine, senna-docusate, sodium chloride, sorbitol, zolpidem  TEE per Dr. Shirlee Latch 03/18/2011: Brief outline of findings (see note in echo section for full details):  No evidence for endocarditis.  LV EF 55-60%.  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 No steroids per derm (Dr Yetta Barre) rec's. - Continue Benadryl PRN, Pepcid. TEE done negative for endocarditis  Yeast bacteremia: repeat blood cultures done on 03/16/2011 also positive for yeast - Right PermCath removed, TEE negative for endocarditis, amputation site clean  - on micafungin, ID following, await on sensitivities for the blood cultures. Blood cultures from 03/19/2011 negative so for - Appreciate ophthalmology exam by Dr. Harlon Flor, no fungal disease in eyes. - I discussed in detail with ID (Dr. Daiva Eves) who stated that it is not Candida albicans on the blood cultures that were received on 03/15/2011. Await final  sensitivities as it may change management for disposition; by mouth fluconazole versus IV micafungin with a central line.   .Hyperkalemia: Resolved after the hemodialysis  .Diabetes mellitus: Blood sugars improving today   .HTN (hypertension): BP stable  .ESRD (end stage renal disease) on dialysis: On HD Tuesday, Thursday and Saturday, renal following    .Secondary hyperparathyroidism (of renal origin): Per renal   .Anemia of chronic disease: Per renal service, hemoglobin at baseline   .Diabetic foot ulcer/ Osteomyelitis: Status post recent right great toe amputation, wound clean and healing.  DVT prophylaxis:  heparin subcutaneous for DVT prophylaxis, continue to monitor platelets.  Keerthana Vanrossum 03/20/2011, 10:26 AM   Addendum: I spoke to the micro-lab myself, we should have the yeast species identified on the culture done on 03/15/2011 by tomorrow morning but the actual sensitivities will take 2-3 days. I have discussed in detail with the patient regarding the cultures, hopefully DC tomorrow after the culture results with ID recommendations. I have discussed with Dr. Daiva Eves (ID) today as well.  Burlie Cajamarca 03/20/2011, 10:45 AM

## 2011-03-20 NOTE — Progress Notes (Signed)
Subjective: No new complaints wants to go home asap  Objective: Weight change: -16 lb 8.6 oz (-7.5 kg)  Intake/Output Summary (Last 24 hours) at 03/20/11 1500 Last data filed at 03/20/11 1300  Gross per 24 hour  Intake    873 ml  Output     50 ml  Net    823 ml   Blood pressure 120/76, pulse 76, temperature 98 F (36.7 C), temperature source Tympanic, resp. rate 18, height 5\' 3"  (1.6 m), weight 188 lb 11.4 oz (85.6 kg), SpO2 96.00%. Temp:  [97.2 F (36.2 C)-98.3 F (36.8 C)] 98 F (36.7 C) (11/25 1400) Pulse Rate:  [74-82] 76  (11/25 1400) Resp:  [18] 18  (11/25 1400) BP: (120-178)/(65-83) 120/76 mmHg (11/25 1400) SpO2:  [95 %-100 %] 96 % (11/25 1400) Weight:  [188 lb 11.4 oz (85.6 kg)] 188 lb 11.4 oz (85.6 kg) (11/24 2034)  Physical Exam: General: Alert and awake, oriented x3, not in any acute distress. HEENT: anicteric sclera, pupils reactive to light and accommodation, EOMI CVS regular rate, normal r,  no murmur rubs or gallops Chest: clear to auscultation bilaterally, no wheezing, rales or rhonchi Abdomen: soft nontender, nondistended, normal bowel sounds, Extremities: no  clubbing or edema noted bilaterally Neuro: nonfocal  Lab Results:  Basename 03/19/11 0730 03/18/11 0701  WBC 5.8 6.7  HGB 9.8* 10.0*  HCT 30.6* 31.1*  PLT 169 171   BMET  Basename 03/19/11 0730 03/18/11 0701  NA 134* 134*  K 3.9 5.0  CL 97 94*  CO2 21 22  GLUCOSE 180* 109*  BUN 51* 75*  CREATININE 6.35* 8.13*  CALCIUM 8.2* 8.0*    Micro Results: Recent Results (from the past 240 hour(s))  CULTURE, BLOOD (ROUTINE X 2)     Status: Normal (Preliminary result)   Collection Time   03/15/11  2:30 PM      Component Value Range Status Comment   Specimen Description BLOOD   Final    Special Requests     Final    Value: BOTTLES DRAWN AEROBIC AND ANAEROBIC 10CC RIGHT IJ CATHETER   Setup Time [id number]   Final    Culture     Final    Value: YEAST     Note: Gram Stain Report Called  to,Read Back By and Verified With: JENAE MALONE @ 1030 03/16/11 WICKN   Report Status PENDING   Incomplete   CULTURE, BLOOD (ROUTINE X 2)     Status: Normal (Preliminary result)   Collection Time   03/15/11  3:20 PM      Component Value Range Status Comment   Specimen Description BLOOD HEMODIALYSIS CATHETER   Final    Special Requests BOTTLES DRAWN AEROBIC AND ANAEROBIC 10CC   Final    Setup Time [id number]   Final    Culture     Final    Value: YEAST     Note: Gram Stain Report Called to,Read Back By and Verified With: TINA ISAACS @ 0044 ON 03/17/11 BY GOLLD   Report Status PENDING   Incomplete   PATHOLOGIST SMEAR REVIEW     Status: Normal   Collection Time   03/15/11  9:53 PM      Component Value Range Status Comment   Tech Review     Final    Value: MILD ABSOLUTE NEUTROPHILIC LEUKOCYTOSIS WITH NO LEFT SHIFT.  URINE CULTURE     Status: Normal   Collection Time   03/16/11  9:03 AM  Component Value Range Status Comment   Specimen Description URINE, CLEAN CATCH   Final    Special Requests NONE   Final    Setup Time [id number]   Final    Colony Count 85,000 COLONIES/ML   Final    Culture     Final    Value: Multiple bacterial morphotypes present, none predominant. Suggest appropriate recollection if clinically indicated.   Report Status 03/17/2011 FINAL   Final   CULTURE, BLOOD (ROUTINE X 2)     Status: Normal (Preliminary result)   Collection Time   03/16/11 12:05 PM      Component Value Range Status Comment   Specimen Description BLOOD RIGHT ARM   Final    Special Requests BOTTLES DRAWN AEROBIC AND ANAEROBIC 10CC   Final    Setup Time [id number]   Final    Culture     Final    Value: YEAST     Note: Gram Stain Report Called to,Read Back By and Verified With: Theodis Blaze @ 1826 ON 03/18/11 BY GOLLD   Report Status PENDING   Incomplete   CULTURE, BLOOD (ROUTINE X 2)     Status: Normal (Preliminary result)   Collection Time   03/16/11 12:10 PM      Component  Value Range Status Comment   Specimen Description BLOOD RIGHT ARM   Final    Special Requests BOTTLES DRAWN AEROBIC AND ANAEROBIC 10CC   Final    Setup Time [id number]   Final    Culture     Final    Value: YEAST     Note: Gram Stain Report Called to,Read Back By and Verified With: DENISE BEASON @ 2147 ON 03/18/11 BY GOLLD   Report Status PENDING   Incomplete   CULTURE, BLOOD (ROUTINE X 2)     Status: Normal (Preliminary result)   Collection Time   03/19/11  9:30 AM      Component Value Range Status Comment   Specimen Description BLOOD LEFT ARM   Final    Special Requests BOTTLES DRAWN AEROBIC AND ANAEROBIC 10CC   Final    Setup Time [id number]   Final    Culture     Final    Value:        BLOOD CULTURE RECEIVED NO GROWTH TO DATE CULTURE WILL BE HELD FOR 5 DAYS BEFORE ISSUING A FINAL NEGATIVE REPORT   Report Status PENDING   Incomplete   CULTURE, BLOOD (ROUTINE X 2)     Status: Normal (Preliminary result)   Collection Time   03/19/11  9:32 AM      Component Value Range Status Comment   Specimen Description BLOOD LEFT ARM   Final    Special Requests BOTTLES DRAWN AEROBIC AND ANAEROBIC 10CC   Final    Setup Time [id number]   Final    Culture     Final    Value:        BLOOD CULTURE RECEIVED NO GROWTH TO DATE CULTURE WILL BE HELD FOR 5 DAYS BEFORE ISSUING A FINAL NEGATIVE REPORT   Report Status PENDING   Incomplete     Studies/Results: Dg Toe Great Right  03/01/2011  *RADIOLOGY REPORT*  Clinical Data: Open wound.  RIGHT TOE - 2+ VIEW  Comparison: None  Findings: Soft tissue thickening and irregularity noted.  There is destructive bony change involving the distal tuft region of the distal phalanx suggesting osteomyelitis.  Joint spaces are maintained.  IMPRESSION: Plain film findings suspicious for osteomyelitis  involving the distal tuft region of the distal phalanx.  Original Report Authenticated By: P. Loralie Champagne, M.D.    Antibiotics:  Anti-infectives     Start      Dose/Rate Route Frequency Ordered Stop   03/17/11 1100   micafungin (MYCAMINE) 100 mg in sodium chloride 0.9 % 100 mL IVPB        100 mg 100 mL/hr over 1 Hours Intravenous Every 24 hours 03/16/11 1107     03/16/11 1130   micafungin (MYCAMINE) 100 mg in sodium chloride 0.9 % 100 mL IVPB        100 mg 100 mL/hr over 1 Hours Intravenous STAT 03/16/11 1107 03/16/11 1400          Medications: Scheduled Meds:    . amLODipine  10 mg Oral QHS  . calcium acetate  2,001 mg Oral TID WC  . darbepoetin (ARANESP) injection - DIALYSIS  200 mcg Intravenous Q Sat-HD  . famotidine (PEPCID) IV  20 mg Intravenous Q24H  . fentaNYL  250 mcg Intravenous Once  . heparin subcutaneous  5,000 Units Subcutaneous Q8H  . insulin aspart  0-20 Units Subcutaneous TID WC  . insulin aspart  0-5 Units Subcutaneous QHS  . insulin glargine  18 Units Subcutaneous QHS  . micafungin [facility]) IV  100 mg Intravenous Q24H  . midazolam  10 mg Intravenous Once  . multivitamin  1 tablet Oral Daily  . paricalcitol  2 mcg Intravenous Q T,Th,Sa-HD  . phenylephrine  3 drop Both Eyes Once  . sodium chloride  3 mL Intravenous Q12H  . tropicamide  3 drop Both Eyes Once   Continuous Infusions:    . sodium chloride    . sodium chloride 250 mL (03/18/11 0949)   PRN Meds:.sodium chloride, sodium chloride, acetaminophen, benzocaine, camphor-menthol, diphenhydrAMINE, diphenhydrAMINE, docusate sodium, feeding supplement (NEPRO CARB STEADY), heparin, heparin, heparin, heparin, HYDROmorphone, hydrOXYzine, lidocaine, lidocaine-prilocaine, ondansetron (ZOFRAN) IV, ondansetron, oxyCODONE, pentafluoroprop-tetrafluoroeth, promethazine, promethazine, senna-docusate, sodium chloride, sorbitol, zolpidem  Assessment/Plan: [name] is a 50 y.o. female with   with HD line but also has immature Left arm AV fistula presents with leukocytoclastic vasculitis and candidemia that has not yet proven to be cleared on repeat cultures (done  prior to HD removal)  Hd Catheter is OUt TEE is negative for vegetations. Optho exam is clear  --continue micafungin for now --make sure that repeat blood culturs are actually negative --this is a non albicans species per micro lab, they should have ID of species by tomorrow afternoon --If this is a candida parapsilosis, candida tropicalis then oral  fluconazole should work well --however if it is a candida kruseii or candida glabrata then there is sig risk for azole resistance d in that case two week course of mycafungin would be safer bet (would require central line though)     LOS: 5 days   Paulette Blanch Dam 03/20/2011, 3:00 PM

## 2011-03-20 NOTE — Progress Notes (Signed)
Subjective:  NO COS Objective Vital signs in last 24 hours: Filed Vitals:   03/19/11 1800 03/19/11 2034 03/20/11 0449 03/20/11 1000  BP: 161/83 178/77 172/69 124/65  Pulse: 78 74 82 75  Temp: 98.3 F (36.8 C) 97.2 F (36.2 C) 98 F (36.7 C) 97.6 F (36.4 C)  TempSrc: Oral Oral Oral Oral  Resp: 18 18 18 18   Height:  5\' 3"  (1.6 m)    Weight:  85.6 kg (188 lb 11.4 oz)    SpO2: 99% 98% 95% 100%   Weight change: -7.5 kg (-16 lb 8.6 oz)  Intake/Output Summary (Last 24 hours) at 03/20/11 1014 Last data filed at 03/20/11 0900  Gross per 24 hour  Intake    873 ml  Output   4550 ml  Net  -3677 ml   Labs: Basic Metabolic Panel:  Lab 03/19/11 7829 03/18/11 0701 03/16/11 0630  NA 134* 134* 133*  K 3.9 5.0 4.8  CL 97 94* 95*  CO2 21 22 21   GLUCOSE 180* 109* 222*  BUN 51* 75* 36*  CREATININE 6.35* 8.13* 5.28*  CALCIUM 8.2* 8.0* 8.8  ALB -- -- --  PHOS -- 8.4* 7.7*   Liver Function Tests:  Lab 03/18/11 0701 03/16/11 0630 03/15/11 2153  AST -- -- 18  ALT -- -- 11  ALKPHOS -- -- 299*  BILITOT -- -- 1.3*  PROT -- -- 9.8*  ALBUMIN 3.0* 3.3* 4.0   No results found for this basename: LIPASE:3,AMYLASE:3 in the last 168 hours No results found for this basename: AMMONIA:3 in the last 168 hours CBC:  Lab 03/19/11 0730 03/18/11 0701 03/16/11 0500 03/15/11 0832  WBC 5.8 6.7 8.8 --  NEUTROABS -- -- -- --  HGB 9.8* 10.0* 11.1* --  HCT 30.6* 31.1* 34.1* --  MCV 86.7 85.9 86.5 86.1  PLT 169 171 170 --   Cardiac Enzymes: No results found for this basename: CKTOTAL:5,CKMB:5,CKMBINDEX:5,TROPONINI:5 in the last 168 hours CBG:  Lab 03/20/11 0800 03/19/11 2116 03/19/11 1718 03/19/11 1201 03/18/11 2144  GLUCAP 136* 190* 221* 120* 194*    Iron Studies: No results found for this basename: IRON,TIBC,TRANSFERRIN,FERRITIN in the last 72 hours Studies/Results: No results found. Medications:    . sodium chloride    . sodium chloride 250 mL (03/18/11 0949)      . amLODipine  10 mg  Oral QHS  . calcium acetate  2,001 mg Oral TID WC  . darbepoetin (ARANESP) injection - DIALYSIS  200 mcg Intravenous Q Sat-HD  . famotidine (PEPCID) IV  20 mg Intravenous Q24H  . fentaNYL  250 mcg Intravenous Once  . heparin subcutaneous  5,000 Units Subcutaneous Q8H  . insulin aspart  0-20 Units Subcutaneous TID WC  . insulin aspart  0-5 Units Subcutaneous QHS  . insulin glargine  18 Units Subcutaneous QHS  . micafungin [facility]) IV  100 mg Intravenous Q24H  . midazolam  10 mg Intravenous Once  . multivitamin  1 tablet Oral Daily  . paricalcitol  2 mcg Intravenous Q T,Th,Sa-HD  . phenylephrine  3 drop Both Eyes Once  . sodium chloride  3 mL Intravenous Q12H  . tropicamide  3 drop Both Eyes Once   I  have reviewed scheduled and prn medications.  Physical Exam: General:ALERT Heart: RRR Lungs:CTA BILAT Abdomen: OBESE, SOFT, NT Extremities: Dialysis Access: POS,. BRUIT , NO PEDAL EDEMA   Problem/Plan: 1. FUNGEMIA= ON  MICAFUNGIN IV ,AWAITING FINAL RESULTS 2. ESRD -HD TTHS [facility]  3. Anemia - MAX EPO,  FU HGB 4. Secondary hyperparathyroidism - ON PHOSLO, ZEMPLAR, FU AM CA AND PHOS 5. HTN/volume - SOME LARGE WT. GAINS ,SL BETTER IN HOSP 6. DM = STABLE BS 7.PVD= SP RT. FOOT SURG. STABLE I have seen and examined this patient and agree with the plan of care. Dialysis TTS  [facility] 03/20/2011, 11:08 AM  Lenny Pastel, PA-C [facility] Beeper [phone] 03/20/2011,10:14 AM  LOS: 5 days

## 2011-03-20 NOTE — Discharge Summary (Signed)
DISCHARGE SUMMARY  [name], 50 y.o., DOB [birth year], MRN [id number]. Admission date: 03/01/2011 Discharge Date 03/05/2011 Primary MD No primary provider on file. Admitting Physician Antony Blackbird Rizwan   Admission Diagnosis  Cellulitis [682.9] Osteomyelitis [730.20] Renal failure, acute on chronic [584.9] per dr orders hallux/cellulitits OSTEO RT GREAT TOE   Discharge Diagnosis   Principal Problem:  *Diabetic foot ulcer Active Problems:  Diabetes mellitus  HTN (hypertension)  Anemia  ESRD (end stage renal disease) on dialysis  Obesities, morbid  Osteomyelitis  Secondary hyperparathyroidism (of renal origin)  Anemia of chronic disease  Complication of vascular access for dialysis       Past Medical History   Diagnosis  Date   .  Hypertension     .  Anemia     .  Diabetes mellitus     .  Renal failure         Past Surgical History   Procedure  Date   .  Leg amputation below knee         left   .  Insertion of dialysis catheter     .  Arteriovenous graft placement     .  Below knee leg amputation        Consults  orthopedic surgery                   nephrology   Significant Tests:  See full reports for all details      Dg Toe Great Right   03/01/2011  *RADIOLOGY REPORT*  Clinical Data: Open wound.  RIGHT TOE - 2+ VIEW  Comparison: None  Findings: Soft tissue thickening and irregularity noted.  There is destructive bony change involving the distal tuft region of the distal phalanx suggesting osteomyelitis.  Joint spaces are maintained.    IMPRESSION: Plain film findings suspicious for osteomyelitis involving the distal tuft region of the distal phalanx.  Original Report Authenticated By: P. Loralie Champagne, M.D.    Brief H&P This is a 50 year old female with a past medical history of hypertension diabetes and end-stage kidney disease on hemodialysis.   The patient went to see a podiatrist today (Dr. Leeanne Deed) for an ulcer on her right  foot. She states that there was "hard skin present there" which peeled off and resulted in an ulcer. She states that the ulcer is been there for the past 2 days. She has not noticed any discharge from the ulcer. She has not noticed any redness or increased swelling of her toe or foot. She has not had any fever or chills.She does not complain of any pain or numbness in her feet. As far as she knows she does not have neuropathy.   After evaluation in the podiatrist's office she was sent to the hospital       Today     Subjective:      [name] today has only mild pain in the foot. Well controlled on po opiods. Patient seen at hemodialysis.    Objective:      Blood pressure 143/63, pulse 69, temperature 96.8 F (36 C), temperature source Oral, resp. rate 24, height 5\' 3"  (1.6 m), weight 98 kg (216 lb 0.8 oz), SpO2 99.00%. Intake/Output Summary (Last 24 hours) at 03/05/11 1111 Last data filed at 03/04/11 1500   Gross per 24 hour   Intake     480 ml   Output       0 ml   Net     480 ml  Exam Awake Alert, Oriented 3, No new F.N deficits, Normal affect Dundy.AT,PERRAL Supple Neck,No JVD, No cervical lymphadenopathy appriciated.   Symmetrical Chest wall movement, Good air movement bilaterally, CTAB RRR,No Gallops,Rubs or new Murmurs, No Parasternal Heave +ve B.Sounds, Abd Soft, Non tender, No organomegaly appriciated, No rebound -guarding or rigidity. Trace edema of lower extremities.     Hospital Course: #1 osteomyelitis of the right great toe: Patient was sent to the hospital from the podiatrist's office for the diabetic foot ulcer. She was started on antibiotics vancomycin and clindamycin. Orthopedics consultation was obtained and patient underwent amputation of the right great toe. I called and discussed with Dr. Lajoyce Corners , and he recommended to discharge the patient without any antibiotics at this time. Patient to follow with Dr. Lajoyce Corners in one week. Patient will be given by  mouth Percocet for the pain control.   #2 end-stage renal disease: Patient was seen by nephrology in the hospital. She will continue the outpatient dialysis after discharge from the hospital.   #3 hypertension: Patient will continue bisoprolol-hydrochlorothiazide, and amlodipine which was ordered in the hospital.   #4 diabetes mellitus: Patient will continue to take Lantus, and insulin aspart at her usual home dose.   #5 anemia: Hemoglobin is stable at this time. Patient has anemia of chronic disease.     Data Review        Cultures -  Results for orders placed during the hospital encounter of 03/01/11   CULTURE, BLOOD (ROUTINE X 2)     Status: Normal (Preliminary result)     Collection Time     03/01/11  5:55 PM       Component  Value  Range  Status  Comment     Specimen Description  BLOOD ARM RIGHT     Final       Special Requests  BOTTLES DRAWN AEROBIC ONLY 3CC     Final       Setup Time  [id number]     Final       Culture        Final       Value:         BLOOD CULTURE RECEIVED NO GROWTH TO DATE CULTURE WILL BE HELD FOR 5 DAYS BEFORE ISSUING A FINAL NEGATIVE REPORT     Report Status  PENDING     Incomplete     CULTURE, BLOOD (ROUTINE X 2)     Status: Normal (Preliminary result)     Collection Time     03/01/11  6:05 PM       Component  Value  Range  Status  Comment     Specimen Description  BLOOD ARM RIGHT     Final       Special Requests  BOTTLES DRAWN AEROBIC ONLY 3CC LOWER     Final       Setup Time  [id number]     Final       Culture        Final       Value:         BLOOD CULTURE RECEIVED NO GROWTH TO DATE CULTURE WILL BE HELD FOR 5 DAYS BEFORE ISSUING A FINAL NEGATIVE REPORT     Report Status  PENDING     Incomplete     WOUND CULTURE     Status: Normal     Collection Time     03/01/11  7:01 PM       Component  Value  Range  Status  Comment     Specimen Description  WOUND TOE     Final       Special Requests  NONE     Final       Gram Stain        Final        Value:  ABUNDANT WBC PRESENT, PREDOMINANTLY PMN        RARE SQUAMOUS EPITHELIAL CELLS PRESENT        ABUNDANT GRAM POSITIVE COCCI IN PAIRS        IN CLUSTERS RARE GRAM NEGATIVE RODS     Culture        Final       Value:  ABUNDANT STAPHYLOCOCCUS AUREUS        Note: RIFAMPIN AND GENTAMICIN SHOULD NOT BE USED AS SINGLE DRUGS FOR TREATMENT OF STAPH INFECTIONS. This organism DOES NOT demonstrate inducible Clindamycin resistance in vitro.     Report Status  03/04/2011 FINAL     Final       Organism ID, Bacteria  STAPHYLOCOCCUS AUREUS     Final           CBC w Diff: Lab Results   Component  Value  Date     WBC  5.0  03/05/2011     HGB  9.6*  03/05/2011     HCT  30.4*  03/05/2011     PLT  178  03/05/2011     LYMPHOPCT  19  03/01/2011     MONOPCT  5  03/01/2011     EOSPCT  2  03/01/2011     BASOPCT  1  03/01/2011    CMP: Lab Results   Component  Value  Date     NA  133*  03/04/2011     K  4.6  03/04/2011     CL  99  03/02/2011     CO2  21  03/02/2011     BUN  53*  03/02/2011     CREATININE  7.96*  03/02/2011     PROT  7.1  10/08/2009     ALBUMIN  2.0*  10/08/2009     BILITOT  0.6  10/08/2009     ALKPHOS  236*  10/08/2009     AST  13  10/08/2009     ALT  9  10/08/2009      Follow Up: Dr Lajoyce Corners in one week     Discharge Medications     Medication List   As of 03/05/2011 11:11 AM     START taking these medications              amLODipine 10 MG tablet     Commonly known as: NORVASC     Take 1 tablet (10 mg total) by mouth daily.           oxyCODONE-acetaminophen 5-325 MG per tablet     Commonly known as: PERCOCET     Take 1 tablet by mouth every 6 (six) hours as needed.                CONTINUE taking these medications              bisoprolol-hydrochlorothiazide 5-6.25 MG per tablet     Commonly known as: ZIAC           calcium acetate (Phos Binder) 667 MG/5ML Soln     Commonly known as: PHOSLYRA  insulin aspart 100 UNIT/ML injection     Commonly known as:  novoLOG           insulin glargine 100 UNIT/ML injection     Commonly known as: LANTUS                 Where to get your medications      These are the prescriptions that you need to pick up.     You may get these medications from any pharmacy.              amLODipine 10 MG tablet     oxyCODONE-acetaminophen 5-325 MG per tablet                    Total Time in preparing paper work, data evaluation and todays exam - 35 minutes      The patient was admitted to the Hospitalist Service.

## 2011-03-21 LAB — RENAL FUNCTION PANEL
CO2: 23 mEq/L (ref 19–32)
Chloride: 95 mEq/L — ABNORMAL LOW (ref 96–112)
Creatinine, Ser: 5.82 mg/dL — ABNORMAL HIGH (ref 0.50–1.10)
GFR calc Af Amer: 9 mL/min — ABNORMAL LOW (ref >90)
GFR calc non Af Amer: 8 mL/min — ABNORMAL LOW (ref >90)
Potassium: 3.9 mEq/L (ref 3.5–5.1)
Sodium: 133 mEq/L — ABNORMAL LOW (ref 135–145)

## 2011-03-21 LAB — MPO/PR-3 (ANCA) ANTIBODIES

## 2011-03-21 LAB — CARDIOLIPIN ANTIBODIES, IGG, IGM, IGA
Anticardiolipin IgA: 17 APL U/mL (ref <22)
Anticardiolipin IgM: 13 MPL U/mL — ABNORMAL HIGH (ref <11)

## 2011-03-21 LAB — PROTEIN C, TOTAL: Protein C, Total: 126 % (ref 72–160)

## 2011-03-21 MED ORDER — FLUCONAZOLE 200 MG PO TABS: 200.0000 mg | ORAL_TABLET | Freq: Every day | ORAL | Status: AC

## 2011-03-21 MED ORDER — OXYCODONE HCL 5 MG PO TABS: 5.0000 mg | ORAL_TABLET | Freq: Four times a day (QID) | ORAL | Status: AC | PRN

## 2011-03-21 MED ORDER — FLUCONAZOLE 200 MG PO TABS: 200.0000 mg | ORAL_TABLET | Freq: Every day | ORAL | Status: DC

## 2011-03-21 MED ORDER — AMLODIPINE BESYLATE 10 MG PO TABS: 10.0000 mg | ORAL_TABLET | Freq: Every day | ORAL | Status: DC

## 2011-03-21 MED ORDER — FLUCONAZOLE 200 MG PO TABS
400.0000 mg | ORAL_TABLET | Freq: Every day | ORAL | Status: DC
  Administered 2011-03-21: 400 mg via ORAL
  Filled 2011-03-21: qty 2

## 2011-03-21 MED ORDER — DIPHENHYDRAMINE HCL 25 MG PO TABS: 25.0000 mg | ORAL_TABLET | Freq: Four times a day (QID) | ORAL | Status: AC | PRN

## 2011-03-21 NOTE — Progress Notes (Signed)
Subjective: Interval History: none. Ready to go home.  Objective: Vital signs in last 24 hours: Temp:  [97.2 F (36.2 C)-98 F (36.7 C)] 97.7 F (36.5 C) (11/26 0553) Pulse Rate:  [75-82] 79  (11/26 0553) Resp:  [18] 18  (11/26 0553) BP: (120-179)/(65-85) 176/78 mmHg (11/26 0553) SpO2:  [96 %-100 %] 97 % (11/26 0553) Weight:  [87 kg (191 lb 12.8 oz)] 191 lb 12.8 oz (87 kg) (11/25 2048) Weight change: 1.9 kg (4 lb 3 oz)  Intake/Output from previous day: 11/25 0701 - 11/26 0700 In: 770 [P.O.:720; IV Piggyback:50] Out: -  Intake/Output this shift:    General: Comfortable Resp: clear to auscultation bilaterally Cardio: regular rate and rhythm, S1, S2 normal, no murmur, click, rub or gallop GI: soft, non-tender; bowel sounds normal; no masses,  no organomegaly Extremities: skin warm and dry; rash both legs Vascular Access: LUE AVF pos T/B   Lab Results:  Basename 03/19/11 0730  WBC 5.8  HGB 9.8*  HCT 30.6*  PLT 169   Renal:  Basename 03/21/11 0636 03/19/11 0730  NA 133* 134*  K 3.9 3.9  CL 95* 97  CO2 23 21  GLUCOSE 163* 180*  BUN 42* 51*  CREATININE 5.82* 6.35*  CALCIUM 9.8 8.2*  PHOS 4.9* --  ALBUMIN 3.2* --   No results found for this basename: PTH:2 in the last 72 hours  Iron Studies: No results found for this basename: IRON,TIBC,TRANSFERRIN,FERRITIN in the last 72 hours  Studies/Results: No results found.  I have reviewed the patient's current medications.  Assessment/Plan: 1. Fungemia- s/p pc removal; TEE 03/18/11 neg endocarditis; neg eye exam; ID following- if final bld cx neg for candida, can change to fluconazole in outpatient per ID; remains on IV Micafungin q 24 hr for now  2. ESRD -HD TTS at [facility]; next tomorrow  3. Anemia - Max Epo; cont and follow trend  4. Secondary hyperparathyroidism - on Phoslo and Zemplar; cont follow Ca/Phos  5. HTN/volume - SBP above goal otherwise fairly well controlled; max UF with HD and follow; will need decrease  eDW outpatient setting.  6. DM- CBG's with SSI per primary 7. Disposition- pending final bld cx results; possibly today  7.PVD/Osteomyelitis-s/p right great toe amputation Dr. Lajoyce Corners. wnd healing.    03/21/2011,9:43 AM  LOS: 6 days   [name], [facility] Pager [phone]

## 2011-03-21 NOTE — Discharge Summary (Signed)
Physician Discharge Summary  Patient ID: [name] MRN: [id number] DOB/AGE: 05/28/1960 50 y.o.  Admit date: 03/15/2011 Discharge date: 03/21/2011  Primary Care Physician:  Loma Sender, M.D. in [place], [place]  Final Discharge Diagnoses:    . Severe leukocytoclastic vasculitis  .Hyperkalemia: Resolved after hemodialysis  .Fungal sepsis: Blood cultures 4/4 positive for CANDIDA PARAPSILOSIS .Diabetes mellitus .HTN (hypertension) .Anemia .ESRD (end stage renal disease) on dialysis .Secondary hyperparathyroidism (of renal origin) .Anemia of chronic disease .Diabetic foot ulcer .Osteomyelitis status post right great toe amputation, wound healing   Consults: 1) renal service #2 infectious disease (Dr. Algis Liming) #3 dermatology (Dr. Arminda Resides) #4 cardiology for TEE (Dr. [name]) #5 orthopedics (Dr. Aldean Baker) #6 ophthalmology (Dr. Chalmers Guest)  Discharge Medications: Discharge Medication List as of 03/21/2011 11:33 AM    START taking these medications   Details  amLODipine (NORVASC) 10 MG tablet Take 1 tablet (10 mg total) by mouth at bedtime., Starting 03/21/2011, Until Tue 03/20/12, Print    diphenhydrAMINE (BENADRYL) 25 MG tablet Take 1 tablet (25 mg total) by mouth every 6 (six) hours as needed for itching., Starting 03/21/2011, Until Wed 04/20/11, Print    fluconazole (DIFLUCAN) 200 MG tablet Take 1 tablet (200 mg total) by mouth daily., Starting 03/22/2011, Until Mon 04/04/11, Print    oxyCODONE (OXY IR/ROXICODONE) 5 MG immediate release tablet Take 1 tablet (5 mg total) by mouth every 6 (six) hours as needed for pain., Starting 03/21/2011, Until Thu 03/31/11, Print      CONTINUE these medications which have NOT CHANGED   Details  calcium acetate, Phos Binder, (PHOSLYRA) 667 MG/5ML SOLN Take 1,334 mg by mouth 3 (three) times daily with meals.  , Until Discontinued, Historical Med    insulin aspart (NOVOLOG) 100 UNIT/ML injection Inject 8 Units into  the skin daily. , Until Discontinued, Historical Med    insulin glargine (LANTUS) 100 UNIT/ML injection Inject 12 Units into the skin at bedtime. , Until Discontinued, Historical Med      STOP taking these medications     oxyCODONE-acetaminophen (PERCOCET) 5-325 MG per tablet      bisoprolol-hydrochlorothiazide (ZIAC) 5-6.25 MG per tablet          Significant Diagnostic Studies:  No results found.  Brief H and P: For complete details please refer to admission H and P, but in brief, patient is a 50 year old female with history of hypertension, end-stage renal disease on hemodialysis with recent right great toe amputation for osteomyelitis, left BKA presented to [facility] emergency room with diffuse purpuric rash. Patient initially developed a purpuric rash on the left thigh which progressively worsened in the last 6 days prior to admission on both thighs extending into the stump of the left BKA and on the right leg, left flank/back, both arms, non-blanchable and dark red/purple rash. Patient was admitted for further workup  Hospital Course:   Rash secondary to leukocytoclastic vasculitis: Significantly improved at discharge. - Patient was admitted to the medicine service, and was started on steroids, Pepcid, Benadryl. Dermatology was consulted and patient was seen by Dr. Arminda Resides who did a skin biopsy. The steroids were discontinued next day. Systemic workup for the vasculitis versus sepsis was undertaken, blood cultures returned positive for yeast. Infectious disease consultation was obtained and patient was started on IV micafungin. Skin biopsy was consistent with leukocytoclastic vasculitis with extensive fibrinoid necrosis and large number of neutrophils. No steroids per derm (Dr Yetta Barre) rec's. The rash at this point has almost resolved.  Fungal  sepsis : Blood cultures done on 03/15/2011 came back positive for Candida parapsilosis - Right PermCath was removed. TEE was done and was  negative for endocarditis. Orthopedics (Dr. Aldean Baker) was consulted for the recent amputation, per or the the wound site is healing well.  Ophthalmology was also consulted and patient had a fall for endoscopy examination by Dr. Harlon Flor and was negative for any fungal disease in the eyes. - Blood cultures from 03/19/2011 negative so far, per ID recommendations, patient was discharged on oral fluconazole based on the Candida species results for 14 days.    .Hyperkalemia: Resolved after the hemodialysis   .Diabetes mellitus: patient was continued on Lantus and sliding scale insulin   .HTN (hypertension): BP stable   .ESRD (end stage renal disease) on dialysis:  Patient was continued on hemodialysis Tuesday, Thursday and Saturday. Right PermCath was removed, likely the source of infection.  .Secondary hyperparathyroidism (of renal origin): Per renal  .Anemia of chronic disease: Per renal service, hemoglobin at baseline    Day of Discharge BP 148/79  Pulse 78  Temp(Src) 97.5 F (36.4 C) (Oral)  Resp 19  Ht 5\' 3"  (1.6 m)  Wt 87 kg (191 lb 12.8 oz)  BMI 33.98 kg/m2  SpO2 99%  LAB RESULTS: Basic Metabolic Panel:  Lab 03/21/11 1610 03/19/11 0730  NA 133* 134*  K 3.9 3.9  CL 95* 97  CO2 23 21  GLUCOSE 163* 180*  BUN 42* 51*  CREATININE 5.82* 6.35*  CALCIUM 9.8 8.2*  MG -- --  PHOS 4.9* --   Liver Function Tests:  Lab 03/21/11 0636 03/18/11 0701 03/15/11 2153  AST -- -- 18  ALT -- -- 11  ALKPHOS -- -- 299*  BILITOT -- -- 1.3*  PROT -- -- 9.8*  ALBUMIN 3.2* 3.0* --    Lab 03/19/11 0730 03/18/11 0701  WBC 5.8 6.7  NEUTROABS -- --  HGB 9.8* 10.0*  HCT 30.6* 31.1*  MCV 86.7 --  PLT 169 171   CBG:  Lab 03/21/11 0749 03/20/11 2201  GLUCAP 262* 162*     Physical Exam: General: Alert and awake, oriented x3, not in any acute distress.  HEENT: anicteric sclera, pupils reactive to light and accommodation, EOMI  CVS: S1-S2 clear, no murmur rubs or gallops  Chest:  clear to auscultation bilaterally, no wheezing, rales or rhonchi  Abdomen: soft nontender, nondistended, normal bowel sounds, no organomegaly  Extremities: Left BKA  Skin: Purpuric rash significantly improved    Disposition and Follow-up: Discharge Orders    Future Orders Please Complete By Expires   Diet - low sodium heart healthy      Increase activity slowly      Discharge instructions      Comments:   Please take Diflucan at bed time daily for 14 days       DISPOSITION:Home  DIET:Heart healthy, carb modified diet   ACTIVITY: As tolerated   DISCHARGE FOLLOW-UP Follow-up Information    Follow up with DUDA,MARCUS V, MD in 2 weeks.   Contact information:   [address] [place] [address] [phone]       Follow up with PHILLIPS,CHARLES W. Make an appointment in 10 days.   Contact information:   [address] [place] [address] [phone]          Time spent on Discharge: 45 minutes   Signed: RAI,RIPUDEEP 03/21/2011, 12:05 PM

## 2011-03-21 NOTE — Progress Notes (Signed)
I have reviewed the data and plan.  Initial blood cultures candida parapsilosis.  Patient was d/c home on fluconazole 200 mg/day to be given through 04/04/11.  Cultures from 03/19/11 are pending to date. She will resume dialysis on her usual outpatient schedule. Julyana Woolverton B,MD 03/21/2011 2:09 PM

## 2011-03-22 ENCOUNTER — Encounter (HOSPITAL_COMMUNITY): Payer: Self-pay | Admitting: Cardiology

## 2011-03-22 LAB — CULTURE, BLOOD (ROUTINE X 2)
Culture  Setup Time: 201211202102
Culture  Setup Time: 201211211817
Culture  Setup Time: 201211212345

## 2011-03-25 LAB — CULTURE, BLOOD (ROUTINE X 2)
Culture  Setup Time: 201211241310
Culture: NO GROWTH

## 2011-03-31 LAB — CULTURE, BLOOD (ROUTINE X 2)

## 2011-04-08 LAB — FUNGAL SUSCEPTIBILITY: Fungal Sensitivity Report faxed & sent:: 8881

## 2011-05-12 ENCOUNTER — Ambulatory Visit
Admission: RE | Admit: 2011-05-12 | Discharge: 2011-05-12 | Disposition: A | Discharge status: Home / Self Care | Payer: Medicare Other | Source: Ambulatory Visit | Attending: Nephrology | Admitting: Nephrology

## 2011-05-12 ENCOUNTER — Other Ambulatory Visit: Payer: Self-pay | Admitting: Nephrology

## 2011-05-12 DIAGNOSIS — R52 Pain, unspecified: Secondary | ICD-10-CM

## 2011-07-30 ENCOUNTER — Inpatient Hospital Stay: Payer: Self-pay | Admitting: Internal Medicine

## 2011-07-30 LAB — URINALYSIS, COMPLETE
Bilirubin,UR: NEGATIVE
Blood: NEGATIVE
Hyaline Cast: 2
Leukocyte Esterase: NEGATIVE
Nitrite: NEGATIVE
Ph: 9 (ref 4.5–8.0)
Protein: >=500
RBC,UR: 13 /HPF (ref 0–5)
Specific Gravity: 1.015 (ref 1.003–1.030)
Squamous Epithelial: 23

## 2011-07-30 LAB — COMPREHENSIVE METABOLIC PANEL
Alkaline Phosphatase: 324 U/L — ABNORMAL HIGH (ref 50–136)
Anion Gap: 11 (ref 7–16)
BUN: 42 mg/dL — ABNORMAL HIGH (ref 7–18)
Bilirubin,Total: 1.5 mg/dL — ABNORMAL HIGH (ref 0.2–1.0)
Co2: 26 mmol/L (ref 21–32)
EGFR (African American): 9 — ABNORMAL LOW
Glucose: 137 mg/dL — ABNORMAL HIGH (ref 65–99)
Osmolality: 283 (ref 275–301)
SGOT(AST): 24 U/L (ref 15–37)

## 2011-07-30 LAB — RENAL FUNCTION PANEL
Albumin: 2.9 g/dL — ABNORMAL LOW (ref 3.4–5.0)
Anion Gap: 12 (ref 7–16)
Chloride: 100 mmol/L (ref 98–107)
Creatinine: 6.46 mg/dL — ABNORMAL HIGH (ref 0.60–1.30)
EGFR (Non-African Amer.): 7 — ABNORMAL LOW
Glucose: 104 mg/dL — ABNORMAL HIGH (ref 65–99)
Osmolality: 286 (ref 275–301)
Potassium: 5.7 mmol/L — ABNORMAL HIGH (ref 3.5–5.1)

## 2011-07-30 LAB — CBC
HCT: 36 % (ref 35.0–47.0)
HGB: 11.4 g/dL — ABNORMAL LOW (ref 12.0–16.0)
MCHC: 31.6 g/dL — ABNORMAL LOW (ref 32.0–36.0)
MCV: 88 fL (ref 80–100)
Platelet: 293 10*3/uL (ref 150–440)

## 2011-07-30 LAB — POTASSIUM: Potassium: 5.7 mmol/L — ABNORMAL HIGH (ref 3.5–5.1)

## 2011-07-30 LAB — LIPASE, BLOOD: Lipase: 125 U/L (ref 73–393)

## 2011-07-31 LAB — LIPID PANEL
Cholesterol: 146 mg/dL (ref 0–200)
HDL Cholesterol: 37 mg/dL — ABNORMAL LOW (ref 40–60)
Ldl Cholesterol, Calc: 88 mg/dL (ref 0–100)
VLDL Cholesterol, Calc: 21 mg/dL (ref 5–40)

## 2011-07-31 LAB — CBC WITH DIFFERENTIAL/PLATELET
Basophil #: 0.1 10*3/uL (ref 0.0–0.1)
Eosinophil #: 0.2 10*3/uL (ref 0.0–0.7)
Eosinophil %: 1.5 %
HCT: 31.3 % — ABNORMAL LOW (ref 35.0–47.0)
HGB: 10.1 g/dL — ABNORMAL LOW (ref 12.0–16.0)
Lymphocyte #: 0.9 10*3/uL — ABNORMAL LOW (ref 1.0–3.6)
MCHC: 32.1 g/dL (ref 32.0–36.0)
MCV: 88 fL (ref 80–100)
Monocyte #: 0.6 10*3/uL (ref 0.0–0.7)
Monocyte %: 6.2 %
Neutrophil #: 8.1 10*3/uL — ABNORMAL HIGH (ref 1.4–6.5)
Platelet: 258 10*3/uL (ref 150–440)
RBC: 3.56 10*6/uL — ABNORMAL LOW (ref 3.80–5.20)
WBC: 9.9 10*3/uL (ref 3.6–11.0)

## 2011-07-31 LAB — COMPREHENSIVE METABOLIC PANEL
Albumin: 2.6 g/dL — ABNORMAL LOW (ref 3.4–5.0)
BUN: 25 mg/dL — ABNORMAL HIGH (ref 7–18)
Bilirubin,Total: 1 mg/dL (ref 0.2–1.0)
Chloride: 95 mmol/L — ABNORMAL LOW (ref 98–107)
EGFR (African American): 14 — ABNORMAL LOW
EGFR (Non-African Amer.): 11 — ABNORMAL LOW
Potassium: 4.5 mmol/L (ref 3.5–5.1)
SGOT(AST): 24 U/L (ref 15–37)
SGPT (ALT): 13 U/L
Total Protein: 8.2 g/dL (ref 6.4–8.2)

## 2011-08-01 LAB — CBC WITH DIFFERENTIAL/PLATELET
Basophil #: 0.1 10*3/uL (ref 0.0–0.1)
Eosinophil #: 0.1 10*3/uL (ref 0.0–0.7)
HCT: 29.8 % — ABNORMAL LOW (ref 35.0–47.0)
Lymphocyte #: 0.8 10*3/uL — ABNORMAL LOW (ref 1.0–3.6)
MCH: 28.3 pg (ref 26.0–34.0)
MCHC: 32.4 g/dL (ref 32.0–36.0)
Monocyte #: 0.6 10*3/uL (ref 0.0–0.7)
Monocyte %: 7.7 %
Neutrophil #: 6.6 10*3/uL — ABNORMAL HIGH (ref 1.4–6.5)
Neutrophil %: 80.1 %
Platelet: 229 10*3/uL (ref 150–440)
RBC: 3.41 10*6/uL — ABNORMAL LOW (ref 3.80–5.20)
RDW: 18.5 % — ABNORMAL HIGH (ref 11.5–14.5)

## 2011-08-01 LAB — COMPREHENSIVE METABOLIC PANEL
Albumin: 2.7 g/dL — ABNORMAL LOW (ref 3.4–5.0)
Alkaline Phosphatase: 238 U/L — ABNORMAL HIGH (ref 50–136)
Anion Gap: 10 (ref 7–16)
Calcium, Total: 8.7 mg/dL (ref 8.5–10.1)
EGFR (African American): 10 — ABNORMAL LOW
Glucose: 80 mg/dL (ref 65–99)
Osmolality: 278 (ref 275–301)
SGOT(AST): 27 U/L (ref 15–37)
SGPT (ALT): 14 U/L
Total Protein: 7.8 g/dL (ref 6.4–8.2)

## 2011-08-02 LAB — COMPREHENSIVE METABOLIC PANEL
Alkaline Phosphatase: 253 U/L — ABNORMAL HIGH (ref 50–136)
Anion Gap: 15 (ref 7–16)
Bilirubin,Total: 0.9 mg/dL (ref 0.2–1.0)
Calcium, Total: 8.3 mg/dL — ABNORMAL LOW (ref 8.5–10.1)
Co2: 22 mmol/L (ref 21–32)
EGFR (Non-African Amer.): 7 — ABNORMAL LOW
Glucose: 103 mg/dL — ABNORMAL HIGH (ref 65–99)
Potassium: 6.2 mmol/L — ABNORMAL HIGH (ref 3.5–5.1)
Sodium: 133 mmol/L — ABNORMAL LOW (ref 136–145)
Total Protein: 7.8 g/dL (ref 6.4–8.2)

## 2011-08-02 LAB — CBC WITH DIFFERENTIAL/PLATELET
Basophil #: 0 10*3/uL (ref 0.0–0.1)
Basophil %: 0.1 %
HCT: 31.9 % — ABNORMAL LOW (ref 35.0–47.0)
HGB: 10.3 g/dL — ABNORMAL LOW (ref 12.0–16.0)
Lymphocyte #: 0.7 10*3/uL — ABNORMAL LOW (ref 1.0–3.6)
MCH: 28.4 pg (ref 26.0–34.0)
MCV: 88 fL (ref 80–100)
Neutrophil #: 13 10*3/uL — ABNORMAL HIGH (ref 1.4–6.5)
Platelet: 223 10*3/uL (ref 150–440)
RBC: 3.64 10*6/uL — ABNORMAL LOW (ref 3.80–5.20)
RDW: 19 % — ABNORMAL HIGH (ref 11.5–14.5)
WBC: 13.9 10*3/uL — ABNORMAL HIGH (ref 3.6–11.0)

## 2011-08-02 LAB — HEMOGLOBIN A1C

## 2011-08-02 LAB — PHOSPHORUS: Phosphorus: 9 mg/dL — ABNORMAL HIGH (ref 2.5–4.9)

## 2011-08-03 LAB — COMPREHENSIVE METABOLIC PANEL
Albumin: 2.5 g/dL — ABNORMAL LOW (ref 3.4–5.0)
Alkaline Phosphatase: 192 U/L — ABNORMAL HIGH (ref 50–136)
BUN: 40 mg/dL — ABNORMAL HIGH (ref 7–18)
Bilirubin,Total: 0.7 mg/dL (ref 0.2–1.0)
Calcium, Total: 7.8 mg/dL — ABNORMAL LOW (ref 8.5–10.1)
Chloride: 92 mmol/L — ABNORMAL LOW (ref 98–107)
Co2: 28 mmol/L (ref 21–32)
Creatinine: 5.06 mg/dL — ABNORMAL HIGH (ref 0.60–1.30)
EGFR (Non-African Amer.): 10 — ABNORMAL LOW
Glucose: 129 mg/dL — ABNORMAL HIGH (ref 65–99)
Osmolality: 274 (ref 275–301)
Potassium: 4.1 mmol/L (ref 3.5–5.1)
SGPT (ALT): 20 U/L
Sodium: 131 mmol/L — ABNORMAL LOW (ref 136–145)

## 2011-08-03 LAB — CBC WITH DIFFERENTIAL/PLATELET
Basophil %: 0.3 %
Eosinophil #: 0.2 10*3/uL (ref 0.0–0.7)
Eosinophil %: 1.5 %
HCT: 29.8 % — ABNORMAL LOW (ref 35.0–47.0)
HGB: 9.5 g/dL — ABNORMAL LOW (ref 12.0–16.0)
Lymphocyte #: 1.1 10*3/uL (ref 1.0–3.6)
MCV: 88 fL (ref 80–100)
Monocyte #: 0.9 x10 3/mm (ref 0.2–0.9)
Monocyte %: 7.6 %
Neutrophil #: 9.3 10*3/uL — ABNORMAL HIGH (ref 1.4–6.5)
Neutrophil %: 81.1 %
Platelet: 255 10*3/uL (ref 150–440)
RDW: 19.4 % — ABNORMAL HIGH (ref 11.5–14.5)
WBC: 11.4 10*3/uL — ABNORMAL HIGH (ref 3.6–11.0)

## 2011-09-02 ENCOUNTER — Inpatient Hospital Stay: Payer: Self-pay | Admitting: Student

## 2011-09-02 LAB — COMPREHENSIVE METABOLIC PANEL
Albumin: 2.5 g/dL — ABNORMAL LOW (ref 3.4–5.0)
BUN: 22 mg/dL — ABNORMAL HIGH (ref 7–18)
Calcium, Total: 8.5 mg/dL (ref 8.5–10.1)
Chloride: 97 mmol/L — ABNORMAL LOW (ref 98–107)
EGFR (Non-African Amer.): 12 — ABNORMAL LOW
Osmolality: 273 (ref 275–301)
Potassium: 4.4 mmol/L (ref 3.5–5.1)
SGOT(AST): 15 U/L (ref 15–37)
SGPT (ALT): 8 U/L — ABNORMAL LOW

## 2011-09-02 LAB — CBC
HGB: 9.7 g/dL — ABNORMAL LOW (ref 12.0–16.0)
MCH: 27.9 pg (ref 26.0–34.0)
MCHC: 31.7 g/dL — ABNORMAL LOW (ref 32.0–36.0)
MCV: 88 fL (ref 80–100)
RBC: 3.46 10*6/uL — ABNORMAL LOW (ref 3.80–5.20)

## 2011-09-03 LAB — CBC WITH DIFFERENTIAL/PLATELET
Eosinophil #: 0.2 10*3/uL (ref 0.0–0.7)
Lymphocyte #: 1.2 10*3/uL (ref 1.0–3.6)
Lymphocyte %: 9.3 %
MCH: 28 pg (ref 26.0–34.0)
MCHC: 31.8 g/dL — ABNORMAL LOW (ref 32.0–36.0)
Monocyte #: 1 x10 3/mm — ABNORMAL HIGH (ref 0.2–0.9)
Neutrophil #: 9.9 10*3/uL — ABNORMAL HIGH (ref 1.4–6.5)
Neutrophil %: 79.7 %
WBC: 12.5 10*3/uL — ABNORMAL HIGH (ref 3.6–11.0)

## 2011-09-03 LAB — BASIC METABOLIC PANEL
BUN: 33 mg/dL — ABNORMAL HIGH (ref 7–18)
Calcium, Total: 8 mg/dL — ABNORMAL LOW (ref 8.5–10.1)
Chloride: 95 mmol/L — ABNORMAL LOW (ref 98–107)
Co2: 28 mmol/L (ref 21–32)
Creatinine: 5.29 mg/dL — ABNORMAL HIGH (ref 0.60–1.30)
Glucose: 78 mg/dL (ref 65–99)
Potassium: 5 mmol/L (ref 3.5–5.1)

## 2011-09-03 LAB — PHOSPHORUS: Phosphorus: 6.1 mg/dL — ABNORMAL HIGH (ref 2.5–4.9)

## 2011-09-04 LAB — CBC WITH DIFFERENTIAL/PLATELET
Basophil #: 0.1 10*3/uL (ref 0.0–0.1)
Basophil %: 0.9 %
Eosinophil %: 1.3 %
Lymphocyte #: 1.2 10*3/uL (ref 1.0–3.6)
Lymphocyte %: 9.5 %
MCHC: 31.7 g/dL — ABNORMAL LOW (ref 32.0–36.0)
MCV: 88 fL (ref 80–100)
Monocyte #: 1.2 x10 3/mm — ABNORMAL HIGH (ref 0.2–0.9)
Monocyte %: 9.7 %
Neutrophil #: 9.6 10*3/uL — ABNORMAL HIGH (ref 1.4–6.5)
Platelet: 199 10*3/uL (ref 150–440)
RBC: 3.28 10*6/uL — ABNORMAL LOW (ref 3.80–5.20)
WBC: 12.3 10*3/uL — ABNORMAL HIGH (ref 3.6–11.0)

## 2011-09-04 LAB — BASIC METABOLIC PANEL
Anion Gap: 10 (ref 7–16)
Calcium, Total: 8.1 mg/dL — ABNORMAL LOW (ref 8.5–10.1)
Creatinine: 4.08 mg/dL — ABNORMAL HIGH (ref 0.60–1.30)
EGFR (African American): 14 — ABNORMAL LOW
EGFR (Non-African Amer.): 12 — ABNORMAL LOW
Glucose: 59 mg/dL — ABNORMAL LOW (ref 65–99)
Osmolality: 273 (ref 275–301)
Sodium: 135 mmol/L — ABNORMAL LOW (ref 136–145)

## 2011-09-06 LAB — CBC WITH DIFFERENTIAL/PLATELET
Basophil #: 0.1 10*3/uL (ref 0.0–0.1)
Basophil #: 0.1 10*3/uL (ref 0.0–0.1)
Eosinophil #: 0.2 10*3/uL (ref 0.0–0.7)
Eosinophil #: 0.2 10*3/uL (ref 0.0–0.7)
Eosinophil %: 2.2 %
HCT: 27.8 % — ABNORMAL LOW (ref 35.0–47.0)
HGB: 8.8 g/dL — ABNORMAL LOW (ref 12.0–16.0)
Lymphocyte #: 1.4 10*3/uL (ref 1.0–3.6)
Lymphocyte #: 1.3 10*3/uL (ref 1.0–3.6)
Lymphocyte %: 12.8 %
Lymphocyte %: 12 %
MCH: 27.8 pg (ref 26.0–34.0)
MCH: 28 pg (ref 26.0–34.0)
MCHC: 31.6 g/dL — ABNORMAL LOW (ref 32.0–36.0)
MCV: 88 fL (ref 80–100)
MCV: 88 fL (ref 80–100)
Monocyte #: 1.2 x10 3/mm — ABNORMAL HIGH (ref 0.2–0.9)
Monocyte %: 10.7 %
Monocyte %: 10.8 %
Neutrophil #: 8 10*3/uL — ABNORMAL HIGH (ref 1.4–6.5)
Neutrophil #: 8.3 10*3/uL — ABNORMAL HIGH (ref 1.4–6.5)
Neutrophil %: 74 %
RBC: 3.16 10*6/uL — ABNORMAL LOW (ref 3.80–5.20)
RDW: 18.2 % — ABNORMAL HIGH (ref 11.5–14.5)
WBC: 11.2 10*3/uL — ABNORMAL HIGH (ref 3.6–11.0)

## 2011-09-06 LAB — BASIC METABOLIC PANEL
Anion Gap: 10 (ref 7–16)
BUN: 57 mg/dL — ABNORMAL HIGH (ref 7–18)
Chloride: 96 mmol/L — ABNORMAL LOW (ref 98–107)
Creatinine: 6.36 mg/dL — ABNORMAL HIGH (ref 0.60–1.30)
Glucose: 65 mg/dL (ref 65–99)
Potassium: 5.3 mmol/L — ABNORMAL HIGH (ref 3.5–5.1)
Sodium: 132 mmol/L — ABNORMAL LOW (ref 136–145)

## 2011-09-07 LAB — CULTURE, BLOOD (SINGLE)

## 2011-09-08 LAB — WOUND CULTURE

## 2011-09-14 LAB — PATHOLOGY REPORT

## 2011-09-15 ENCOUNTER — Ambulatory Visit: Payer: Medicare Other | Admitting: Family Medicine

## 2011-09-21 ENCOUNTER — Ambulatory Visit: Payer: Medicare Other | Admitting: Family Medicine

## 2011-10-21 ENCOUNTER — Ambulatory Visit: Payer: Self-pay | Admitting: Vascular Surgery

## 2011-11-17 ENCOUNTER — Ambulatory Visit: Payer: Medicare Other | Admitting: Family Medicine

## 2011-12-15 ENCOUNTER — Inpatient Hospital Stay: Payer: Self-pay | Admitting: Internal Medicine

## 2011-12-15 LAB — URINALYSIS, COMPLETE
Bacteria: NONE SEEN
Glucose,UR: 150 mg/dL (ref 0–75)
Nitrite: NEGATIVE
Ph: 8 (ref 4.5–8.0)
RBC,UR: 19 /HPF (ref 0–5)

## 2011-12-15 LAB — COMPREHENSIVE METABOLIC PANEL
Albumin: 2.7 g/dL — ABNORMAL LOW (ref 3.4–5.0)
Anion Gap: 9 (ref 7–16)
Bilirubin,Total: 1.1 mg/dL — ABNORMAL HIGH (ref 0.2–1.0)
Calcium, Total: 7.8 mg/dL — ABNORMAL LOW (ref 8.5–10.1)
Chloride: 97 mmol/L — ABNORMAL LOW (ref 98–107)
Co2: 26 mmol/L (ref 21–32)
EGFR (African American): 7 — ABNORMAL LOW
Glucose: 116 mg/dL — ABNORMAL HIGH (ref 65–99)
Osmolality: 280 (ref 275–301)
Potassium: 5.4 mmol/L — ABNORMAL HIGH (ref 3.5–5.1)
SGOT(AST): 22 U/L (ref 15–37)
Sodium: 132 mmol/L — ABNORMAL LOW (ref 136–145)

## 2011-12-15 LAB — SEDIMENTATION RATE: Erythrocyte Sed Rate: 73 mm/hr — ABNORMAL HIGH (ref 0–20)

## 2011-12-15 LAB — CBC
HCT: 34.9 % — ABNORMAL LOW (ref 35.0–47.0)
HGB: 11.5 g/dL — ABNORMAL LOW (ref 12.0–16.0)
MCH: 29.8 pg (ref 26.0–34.0)
MCV: 91 fL (ref 80–100)
Platelet: 176 10*3/uL (ref 150–440)
RDW: 17.2 % — ABNORMAL HIGH (ref 11.5–14.5)
WBC: 11.3 10*3/uL — ABNORMAL HIGH (ref 3.6–11.0)

## 2011-12-15 LAB — PHOSPHORUS: Phosphorus: 7.8 mg/dL — ABNORMAL HIGH (ref 2.5–4.9)

## 2011-12-16 LAB — CBC WITH DIFFERENTIAL/PLATELET
Basophil #: 0.1 10*3/uL (ref 0.0–0.1)
Basophil %: 1.1 %
HCT: 36.7 % (ref 35.0–47.0)
Lymphocyte #: 0.8 10*3/uL — ABNORMAL LOW (ref 1.0–3.6)
Lymphocyte %: 9.8 %
MCH: 29.5 pg (ref 26.0–34.0)
MCV: 91 fL (ref 80–100)
Monocyte %: 8.4 %
Neutrophil #: 6.8 10*3/uL — ABNORMAL HIGH (ref 1.4–6.5)
RBC: 4.01 10*6/uL (ref 3.80–5.20)
RDW: 17.2 % — ABNORMAL HIGH (ref 11.5–14.5)
WBC: 8.6 10*3/uL (ref 3.6–11.0)

## 2011-12-16 LAB — BASIC METABOLIC PANEL
Anion Gap: 13 (ref 7–16)
Calcium, Total: 8 mg/dL — ABNORMAL LOW (ref 8.5–10.1)
Co2: 27 mmol/L (ref 21–32)
Sodium: 133 mmol/L — ABNORMAL LOW (ref 136–145)

## 2011-12-16 LAB — LIPID PANEL: Ldl Cholesterol, Calc: 57 mg/dL (ref 0–100)

## 2011-12-17 LAB — CBC WITH DIFFERENTIAL/PLATELET
Basophil %: 1.1 %
Eosinophil #: 0.2 10*3/uL (ref 0.0–0.7)
HCT: 33.5 % — ABNORMAL LOW (ref 35.0–47.0)
HGB: 11.2 g/dL — ABNORMAL LOW (ref 12.0–16.0)
Lymphocyte %: 11.6 %
MCH: 30.3 pg (ref 26.0–34.0)
MCHC: 33.4 g/dL (ref 32.0–36.0)
Monocyte #: 0.8 x10 3/mm (ref 0.2–0.9)
Neutrophil #: 5.8 10*3/uL (ref 1.4–6.5)
Platelet: 157 10*3/uL (ref 150–440)
RDW: 16.9 % — ABNORMAL HIGH (ref 11.5–14.5)

## 2011-12-17 LAB — RENAL FUNCTION PANEL
Anion Gap: 11 (ref 7–16)
BUN: 63 mg/dL — ABNORMAL HIGH (ref 7–18)
Calcium, Total: 7.6 mg/dL — ABNORMAL LOW (ref 8.5–10.1)
Creatinine: 6.56 mg/dL — ABNORMAL HIGH (ref 0.60–1.30)
EGFR (African American): 8 — ABNORMAL LOW
EGFR (Non-African Amer.): 7 — ABNORMAL LOW
Glucose: 110 mg/dL — ABNORMAL HIGH (ref 65–99)
Osmolality: 283 (ref 275–301)
Potassium: 4.8 mmol/L (ref 3.5–5.1)
Sodium: 132 mmol/L — ABNORMAL LOW (ref 136–145)

## 2011-12-17 LAB — PHOSPHORUS: Phosphorus: 8.1 mg/dL — ABNORMAL HIGH (ref 2.5–4.9)

## 2011-12-17 LAB — HEMOGLOBIN A1C: Hemoglobin A1C: 5.9 % (ref 4.2–6.3)

## 2011-12-18 LAB — URINE CULTURE

## 2011-12-20 LAB — CULTURE, BLOOD (SINGLE)

## 2011-12-30 ENCOUNTER — Ambulatory Visit: Payer: Medicare Other | Admitting: Family Medicine

## 2011-12-30 DIAGNOSIS — Z0289 Encounter for other administrative examinations: Secondary | ICD-10-CM

## 2012-01-06 ENCOUNTER — Ambulatory Visit: Payer: Self-pay | Admitting: Specialist

## 2012-01-06 LAB — BASIC METABOLIC PANEL
BUN: 43 mg/dL — ABNORMAL HIGH (ref 7–18)
Calcium, Total: 8.3 mg/dL — ABNORMAL LOW (ref 8.5–10.1)
Chloride: 101 mmol/L (ref 98–107)
Co2: 28 mmol/L (ref 21–32)
Creatinine: 3.72 mg/dL — ABNORMAL HIGH (ref 0.60–1.30)
EGFR (Non-African Amer.): 14 — ABNORMAL LOW
Osmolality: 291 (ref 275–301)
Potassium: 4.1 mmol/L (ref 3.5–5.1)
Sodium: 138 mmol/L (ref 136–145)

## 2012-01-09 LAB — PATHOLOGY REPORT

## 2012-01-21 ENCOUNTER — Emergency Department (HOSPITAL_COMMUNITY): Payer: Medicare Other

## 2012-01-21 ENCOUNTER — Inpatient Hospital Stay (HOSPITAL_COMMUNITY)
Admission: EM | Admit: 2012-01-21 | Discharge: 2012-01-27 | DRG: 871 | Disposition: A | Discharge status: Skilled Nursing Facility | Payer: Medicare Other | Attending: Pulmonary Disease | Admitting: Pulmonary Disease

## 2012-01-21 ENCOUNTER — Encounter (HOSPITAL_COMMUNITY): Payer: Self-pay

## 2012-01-21 DIAGNOSIS — R6520 Severe sepsis without septic shock: Secondary | ICD-10-CM

## 2012-01-21 DIAGNOSIS — S88119A Complete traumatic amputation at level between knee and ankle, unspecified lower leg, initial encounter: Secondary | ICD-10-CM

## 2012-01-21 DIAGNOSIS — E872 Acidosis, unspecified: Secondary | ICD-10-CM | POA: Diagnosis present

## 2012-01-21 DIAGNOSIS — I469 Cardiac arrest, cause unspecified: Secondary | ICD-10-CM | POA: Diagnosis present

## 2012-01-21 DIAGNOSIS — D696 Thrombocytopenia, unspecified: Secondary | ICD-10-CM | POA: Diagnosis not present

## 2012-01-21 DIAGNOSIS — I739 Peripheral vascular disease, unspecified: Secondary | ICD-10-CM | POA: Diagnosis present

## 2012-01-21 DIAGNOSIS — J9600 Acute respiratory failure, unspecified whether with hypoxia or hypercapnia: Secondary | ICD-10-CM | POA: Diagnosis present

## 2012-01-21 DIAGNOSIS — E119 Type 2 diabetes mellitus without complications: Secondary | ICD-10-CM | POA: Diagnosis present

## 2012-01-21 DIAGNOSIS — A419 Sepsis, unspecified organism: Principal | ICD-10-CM | POA: Diagnosis present

## 2012-01-21 DIAGNOSIS — G9340 Encephalopathy, unspecified: Secondary | ICD-10-CM | POA: Diagnosis present

## 2012-01-21 DIAGNOSIS — N2581 Secondary hyperparathyroidism of renal origin: Secondary | ICD-10-CM

## 2012-01-21 DIAGNOSIS — J9690 Respiratory failure, unspecified, unspecified whether with hypoxia or hypercapnia: Secondary | ICD-10-CM

## 2012-01-21 DIAGNOSIS — R6521 Severe sepsis with septic shock: Secondary | ICD-10-CM

## 2012-01-21 DIAGNOSIS — M869 Osteomyelitis, unspecified: Secondary | ICD-10-CM

## 2012-01-21 DIAGNOSIS — D649 Anemia, unspecified: Secondary | ICD-10-CM | POA: Diagnosis present

## 2012-01-21 DIAGNOSIS — I10 Essential (primary) hypertension: Secondary | ICD-10-CM

## 2012-01-21 DIAGNOSIS — N186 End stage renal disease: Secondary | ICD-10-CM | POA: Diagnosis present

## 2012-01-21 DIAGNOSIS — J9601 Acute respiratory failure with hypoxia: Secondary | ICD-10-CM | POA: Diagnosis present

## 2012-01-21 DIAGNOSIS — Z992 Dependence on renal dialysis: Secondary | ICD-10-CM

## 2012-01-21 DIAGNOSIS — I120 Hypertensive chronic kidney disease with stage 5 chronic kidney disease or end stage renal disease: Secondary | ICD-10-CM | POA: Diagnosis present

## 2012-01-21 LAB — POCT I-STAT 3, ART BLOOD GAS (G3+)
Acid-base deficit: 16 mmol/L — ABNORMAL HIGH (ref 0.0–2.0)
Bicarbonate: 13.8 mEq/L — ABNORMAL LOW (ref 20.0–24.0)
pCO2 arterial: 49.3 mmHg — ABNORMAL HIGH (ref 35.0–45.0)
pH, Arterial: 7.053 — CL (ref 7.350–7.450)
pO2, Arterial: 258 mmHg — ABNORMAL HIGH (ref 80.0–100.0)

## 2012-01-21 LAB — CBC WITH DIFFERENTIAL/PLATELET
Basophils Absolute: 0.1 10*3/uL (ref 0.0–0.1)
Lymphocytes Relative: 26 % (ref 12–46)
Lymphs Abs: 3.1 10*3/uL (ref 0.7–4.0)
Neutro Abs: 7.8 10*3/uL — ABNORMAL HIGH (ref 1.7–7.7)
Neutrophils Relative %: 64 % (ref 43–77)
Platelets: 147 10*3/uL — ABNORMAL LOW (ref 150–400)
RBC: 4.17 MIL/uL (ref 3.87–5.11)
RDW: 16.8 % — ABNORMAL HIGH (ref 11.5–15.5)
WBC: 12.1 10*3/uL — ABNORMAL HIGH (ref 4.0–10.5)

## 2012-01-21 LAB — POCT I-STAT, CHEM 8
Chloride: 104 mEq/L (ref 96–112)
Glucose, Bld: 264 mg/dL — ABNORMAL HIGH (ref 70–99)
HCT: 41 % (ref 36.0–46.0)
Hemoglobin: 13.9 g/dL (ref 12.0–15.0)
Potassium: 4.8 mEq/L (ref 3.5–5.1)
Sodium: 137 mEq/L (ref 135–145)

## 2012-01-21 LAB — BASIC METABOLIC PANEL
CO2: 15 mEq/L — ABNORMAL LOW (ref 19–32)
Calcium: 7.9 mg/dL — ABNORMAL LOW (ref 8.4–10.5)
Chloride: 94 mEq/L — ABNORMAL LOW (ref 96–112)
Creatinine, Ser: 5.62 mg/dL — ABNORMAL HIGH (ref 0.50–1.10)
GFR calc Af Amer: 9 mL/min — ABNORMAL LOW (ref >90)
Sodium: 136 mEq/L (ref 135–145)

## 2012-01-21 LAB — LACTIC ACID, PLASMA: Lactic Acid, Venous: 9.1 mmol/L — ABNORMAL HIGH (ref 0.5–2.2)

## 2012-01-21 LAB — POCT I-STAT TROPONIN I: Troponin i, poc: 0.02 ng/mL (ref 0.00–0.08)

## 2012-01-21 MED ORDER — MIDAZOLAM HCL 2 MG/2ML IJ SOLN
1.0000 mg | Freq: Once | INTRAMUSCULAR | Status: AC
  Administered 2012-01-21: 1 mg via INTRAVENOUS

## 2012-01-21 MED ORDER — ATROPINE SULFATE 1 MG/ML IJ SOLN
INTRAMUSCULAR | Status: AC | PRN
  Administered 2012-01-21: 1 mg via INTRAVENOUS

## 2012-01-21 MED ORDER — EPINEPHRINE HCL 0.1 MG/ML IJ SOLN
INTRAMUSCULAR | Status: AC | PRN
  Administered 2012-01-21: 0.1 mg via INTRAVENOUS
  Administered 2012-01-21: 0.5 mg via INTRAVENOUS

## 2012-01-21 MED ORDER — MIDAZOLAM HCL 2 MG/2ML IJ SOLN
INTRAMUSCULAR | Status: AC
  Administered 2012-01-21: 1 mg via INTRAVENOUS
  Filled 2012-01-21: qty 2

## 2012-01-21 MED ORDER — NOREPINEPHRINE BITARTRATE 1 MG/ML IJ SOLN
INTRAMUSCULAR | Status: AC
  Administered 2012-01-21: 10 mg
  Filled 2012-01-21: qty 4

## 2012-01-21 MED ORDER — DEXTROSE 5 % IV SOLN
INTRAVENOUS | Status: AC
  Administered 2012-01-21: 250 mL
  Filled 2012-01-21: qty 250

## 2012-01-21 MED ORDER — EPINEPHRINE HCL 0.1 MG/ML IJ SOLN
INTRAMUSCULAR | Status: AC
  Filled 2012-01-21: qty 10

## 2012-01-21 MED ORDER — EPINEPHRINE HCL 1 MG/ML IJ SOLN
0.5000 ug/min | INTRAVENOUS | Status: AC
  Administered 2012-01-21: 10 ug/min via INTRAVENOUS
  Filled 2012-01-21: qty 1

## 2012-01-21 MED ORDER — EPINEPHRINE HCL 0.1 MG/ML IJ SOLN
INTRAMUSCULAR | Status: AC | PRN
  Administered 2012-01-21: 0.1 mg via INTRAVENOUS

## 2012-01-21 MED ORDER — EPINEPHRINE HCL 1 MG/ML IJ SOLN
0.5000 ug/min | INTRAVENOUS | Status: DC
  Administered 2012-01-21: 10 ug/min via INTRAVENOUS
  Administered 2012-01-22: 6 ug/min via INTRAVENOUS
  Administered 2012-01-22: 7 ug/min via INTRAVENOUS
  Filled 2012-01-21 (×4): qty 1

## 2012-01-21 MED ORDER — SODIUM CHLORIDE 0.9 % IV SOLN
2.0000 mg/h | INTRAVENOUS | Status: DC
  Administered 2012-01-21: 2 mg/h via INTRAVENOUS
  Filled 2012-01-21 (×2): qty 10

## 2012-01-21 MED ORDER — SODIUM CHLORIDE 0.9 % IV SOLN
INTRAVENOUS | Status: AC | PRN
  Administered 2012-01-21: 1000 mL/h via INTRAVENOUS

## 2012-01-21 MED ORDER — SODIUM CHLORIDE 0.9 % IV SOLN
30.0000 ug/h | INTRAVENOUS | Status: DC
  Administered 2012-01-21: 30 ug/h via INTRAVENOUS
  Filled 2012-01-21: qty 50

## 2012-01-21 MED ORDER — FENTANYL CITRATE 0.05 MG/ML IJ SOLN
INTRAMUSCULAR | Status: AC
  Filled 2012-01-21: qty 2

## 2012-01-21 MED ORDER — FENTANYL CITRATE 0.05 MG/ML IJ SOLN
50.0000 ug | Freq: Once | INTRAMUSCULAR | Status: AC
  Administered 2012-01-21: 50 ug via INTRAVENOUS

## 2012-01-21 MED ORDER — SODIUM CHLORIDE 0.9 % IV SOLN
1.0000 g | Freq: Once | INTRAVENOUS | Status: AC
  Administered 2012-01-21: 1 g via INTRAVENOUS
  Filled 2012-01-21: qty 10

## 2012-01-21 NOTE — Code Documentation (Signed)
Levophed decreased to 7.5 mcg/min. Pt's BP 189/117

## 2012-01-21 NOTE — Code Documentation (Signed)
Family updated as to patient's status. Family in [facility] waiting room, updated by [facility]

## 2012-01-21 NOTE — Code Documentation (Signed)
Family at beside. Family given emotional support. 

## 2012-01-21 NOTE — ED Notes (Signed)
MD at bedside. Admitting me at bedside

## 2012-01-21 NOTE — ED Provider Notes (Signed)
History     CSN: [id number]  Arrival date & time 01/21/12  6962   First MD Initiated Contact with Patient 01/21/12 2048      Chief Complaint  Patient presents with  . Cardiac Arrest    Level V caveat due to patient mental status: Unresponsive cardiac arrest   (Consider location/radiation/quality/duration/timing/severity/associated sxs/prior treatment)  The history is provided by the spouse and the EMS personnel. The history is limited by the condition of the patient.  [name] is a 51 y.o. female history of hypertension, peripheral vascular disease status post left AKA, end-stage renal disease Tuesday Thursday dialysis to receive her dialysis today with complication of "hypotension" per husband presents unresponsive after PEA arrest and ROSC.  Just prior to arrival, patient fell off of a chair and said she needed help from her husband he attempted to help her up and she became unresponsive. At that point he called EMS but did not know how to perform CPR - there is a 10 minute interval between calling EMS and EMS arrival when they initiated CPR. Found her to be in a PEA rhythm. Intubated in the field with 7.0 ET tube. CPR was performed for 5 minutes, she received 1 mg of IV epinephrine upon which she had return of spontaneous circulation with end-tidal CO2 35 in the field. Patient arrives bradycardic, hypotensive with an epinephrine drip running through a right lower extremity intraosseous access that appears correctly placed.  Past Medical History  Diagnosis Date  . Hypertension   . Anemia   . Diabetes mellitus   . Peripheral vascular disease   . Hemodialysis patient 03/15/11    "Tues; Thurs; Sat; [facility]"  . Blood transfusion   . Chronic back pain   . Rash 03/15/11    "admitted me to find out what its' from; UE, waist, thighs, groin"  . Renal failure     hd 4/12    Past Surgical History  Procedure Date  . Insertion of dialysis catheter 07/29/10   right subclavian  . Arteriovenous graft placement 10/01/10    left upper arm  . Below knee leg amputation 2004    left  . Amputation 03/04/2011    Procedure: AMPUTATION DIGIT;  Surgeon: [name], MD;  Location: [facility] OR;  Service: Orthopedics;  Laterality: Right;  RT GREAT TOE AMP  . Cataract extraction w/ intraocular lens implant 12/2009    right eye  . Carpal tunnel release ~ 2009    left wrist  . Tee without cardioversion 03/18/2011    Procedure: TRANSESOPHAGEAL ECHOCARDIOGRAM (TEE);  Surgeon: [name], MD;  Location: [facility] ENDOSCOPY;  Service: Cardiovascular;  Laterality: N/A;    History reviewed. No pertinent family history.  History  Substance Use Topics  . Smoking status: Never Smoker   . Smokeless tobacco: Never Used  . Alcohol Use: No    OB History    Grav Para Term Preterm Abortions TAB SAB Ect Mult Living                  Review of Systems unable to obtain due to patient mental status  Allergies  Codeine; Levofloxacin; and Penicillins  Home Medications   Current Outpatient Rx  Name Route Sig Dispense Refill  . AMLODIPINE BESYLATE 10 MG PO TABS Oral Take 1 tablet (10 mg total) by mouth at bedtime. 30 tablet 3  . CALCIUM ACETATE (PHOS BINDER) 667 MG/5ML PO SOLN Oral Take 1,334 mg by mouth 3 (three) times daily  with meals.      . INSULIN ASPART 100 UNIT/ML [place] SOLN Subcutaneous Inject 8 Units into the skin daily.     . INSULIN GLARGINE 100 UNIT/ML [place] SOLN Subcutaneous Inject 12 Units into the skin at bedtime.       BP 161/95  Pulse 105  Temp 95.5 F (35.3 C)  Resp 17  SpO2 100%  Physical Exam  Nursing notes reviewed.  Electronic medical record reviewed. VITAL SIGNS:   Filed Vitals:   01/22/12 0032 01/22/12 0045 01/22/12 0111 01/22/12 0115  BP:  145/72 125/66 141/68  Pulse:  102 102 102  Temp:  95 F (35 C) 95.5 F (35.3 C) 95.5 F (35.3 C)  TempSrc:   Core (Comment)   Resp:  18 18 18   SpO2: 100% 100% 100% 100%   CONSTITUTIONAL: Unresponsive,  GCS 3, pale HENT: Atraumatic, normocephalic, oral mucosa pink and moist, airway patent. Nares patent without drainage. External ears normal. EYES: Conjunctiva clear, pupils equal at 7 mm, nonreactive  NECK: Trachea midline, non-tender, supple CARDIOVASCULAR: Recurrent heart rate, Normal rhythm PULMONARY/CHEST: Bilateral breath sounds, some mild crackles in the dependent portions. Non-tender. ABDOMINAL: Obese, distended. Healing open cholecystectomy scar right upper quadrant NEUROLOGIC: Non-focal, moving all four extremities, no gross sensory or motor deficits. EXTREMITIES: Left lower extremity AKA.  SKIN: Cool, mottled upper chest, yeast to the lower pannus and skin full  ED Course  CRITICAL CARE Performed by: Jones Skene Authorized by: Jones Skene  CRITICAL CARE Performed by: Jones Skene Authorized by: Jones Skene Total critical care time: 60 minutes Critical care time was exclusive of separately billable procedures and treating other patients and teaching time. Critical care was necessary to treat or prevent imminent or life-threatening deterioration of the following conditions: cardiac failure, circulatory failure, respiratory failure and shock. Critical care was time spent personally by me on the following activities: development of treatment plan with patient or surrogate, discussions with consultants, interpretation of cardiac output measurements, examination of patient, evaluation of patient's response to treatment, obtaining history from patient or surrogate, ordering and review of laboratory studies, pulse oximetry, review of old charts, vascular access procedures, ventilator management, re-evaluation of patient's condition, ordering and review of radiographic studies and ordering and performing treatments and interventions.  [place] bedside Date/Time: 01/21/2012 2:23 AM Performed by: Jones Skene Authorized by: Jones Skene Comments: Ultrasound of the heart was  done at bedside - poor echocardiographic windows, parasternal long view showed tachycardic rate, no focal areas of hypokinesis seen, but not excluded.     (including critical care time)   Date: 01/22/2012  Rate:   Rhythm: normal sinus rhythm  QRS Axis: normal  Intervals: normal  ST/T Wave abnormalities: normal  Conduction Disutrbances: none  Narrative Interpretation: unremarkable  Labs Reviewed  BASIC METABOLIC PANEL - Abnormal; Notable for the following:    Chloride 94 (*)     CO2 15 (*)     Glucose, Bld 277 (*)     BUN 60 (*)     Creatinine, Ser 5.62 (*)     Calcium 7.9 (*)     GFR calc non Af Amer 8 (*)     GFR calc Af Amer 9 (*)     All other components within normal limits  CBC WITH DIFFERENTIAL - Abnormal; Notable for the following:    WBC 12.1 (*)     RDW 16.8 (*)     Platelets 147 (*)     Neutro Abs 7.8 (*)     All  other components within normal limits  LACTIC ACID, PLASMA - Abnormal; Notable for the following:    Lactic Acid, Venous 9.1 (*)     All other components within normal limits  POCT I-STAT 3, BLOOD GAS (G3+) - Abnormal; Notable for the following:    pH, Arterial 7.053 (*)     pCO2 arterial 49.3 (*)     pO2, Arterial 258.0 (*)     Bicarbonate 13.8 (*)     Acid-base deficit 16.0 (*)     All other components within normal limits  POCT I-STAT, CHEM 8 - Abnormal; Notable for the following:    BUN 74 (*)     Creatinine, Ser 5.00 (*)     Glucose, Bld 264 (*)     Calcium, Ion 0.90 (*)     All other components within normal limits  CBC - Abnormal; Notable for the following:    WBC 25.6 (*)     Hemoglobin 11.8 (*)     RDW 16.9 (*)     All other components within normal limits  CREATININE, SERUM - Abnormal; Notable for the following:    Creatinine, Ser 5.53 (*)     GFR calc non Af Amer 8 (*)     GFR calc Af Amer 10 (*)     All other components within normal limits  GLUCOSE, CAPILLARY - Abnormal; Notable for the following:    Glucose-Capillary 251 (*)       All other components within normal limits  POCT I-STAT TROPONIN I  TROPONIN I  CBC  BASIC METABOLIC PANEL  BLOOD GAS, ARTERIAL  MAGNESIUM  PHOSPHORUS  CULTURE, BLOOD (ROUTINE X 2)  CULTURE, BLOOD (ROUTINE X 2)  LACTIC ACID, PLASMA  TROPONIN I  TROPONIN I   Dg Chest Portable 1 View  01/21/2012  *RADIOLOGY REPORT*  Clinical Data: Cardiac arrest and non responsive.  PORTABLE CHEST - 1 VIEW  Comparison: 01/21/2012  Findings: Endotracheal tube has been slightly pulled back and roughly 1.5 cm above the carina.  The patient has increased densities throughout the lungs.  Findings are concerning for volume loss and edema.  Again noted are defibrillator pads overlying the left hemithorax.  Nasogastric tube extends into the abdomen.  IMPRESSION: Endotracheal tube has been slightly pulled back and located just above the carina.  Increased densities throughout both lungs are concerning for low lung volumes and worsening interstitial edema.   Original Report Authenticated By: Richarda Overlie, M.D.    Dg Chest Portable 1 View  01/21/2012  *RADIOLOGY REPORT*  Clinical Data: Evaluate endotracheal tube placement.  PORTABLE CHEST - 1 VIEW  Comparison: 02/26/2009  Findings: Endotracheal tube extends into the proximal right mainstem bronchus.  Nasogastric tube extends in the abdomen.  There is a defibrillator pad overlying the left hemithorax.  Heart size is upper limits of normal. Prominent interstitial markings are suggestive for volume loss and cannot exclude mild edema.  Negative for a pneumothorax.  IMPRESSION: Endotracheal tube in the proximal right mainstem bronchus. Findings discussed with Dr. Rulon Abide on 01/21/2012 at 9:22 p.m.  Low lung volumes with prominent interstitial markings.   Original Report Authenticated By: Richarda Overlie, M.D.    1. PEA (Pulseless electrical activity)   2. Cardiac arrest   3. ESRD on dialysis   4. Diabetes mellitus   5. Obesities, morbid     MDM  [name] is a 51 y.o. female  who presented status post PEA arrest at return of spontaneous circulation. Per EMS, patient started  reacting to noxious stimuli and hypothermia protocol was stopped. Upon arrival in the emergency department the patient didn't into bradycardia with hypotension required further doses of epinephrine and epinephrine drip to maintain blood pressure and heart rate. Patient's endotracheal tube was confirmed by end-tidal CO2, chest x-ray showed the tip of the ETT was in the right mainstem bronchus was withdrawn for better positioning. Resident placed a left femoral line please refer to his procedure note - I was available for all parts of the procedure.   The patient was placed on a ventilator, ABGs were obtained showing the patient was in a metabolic acidosis, she was breathing over the vent. Vent parameters adjusts for hyperoxia seen on ABG.  Cardiac biomarkers negative. There were EKG changes, however with negative biomarkers do not think arrest was primarily from ACS, and had been having hypotensive episodes with dialysis earlier this evening.  The other white count may be secondary to epinephrine. The patient was not immediately placed on hypothermic protocol due to the labile nature of her blood pressure and tachycardia and secondarily due to physical response to stimuli.  Pt sedated with fentanyl/versed gtt in the ED.  D/w Critical Care for admission.       Jones Skene, MD 01/22/12 1610

## 2012-01-21 NOTE — Code Documentation (Signed)
Pt's HR decreased to 33, unable to obtain a BP, EDP aware, VO given to start Epi IV drip

## 2012-01-21 NOTE — ED Notes (Signed)
EDP Arreta informed of pt's core temp 94.6, VO V&RB to apply bear hugger

## 2012-01-21 NOTE — Code Documentation (Signed)
Family at beside. Family given emotional support. Sister in Social worker and Brother in Social worker at bedside

## 2012-01-21 NOTE — Code Documentation (Signed)
XR at bedside

## 2012-01-21 NOTE — Code Documentation (Addendum)
Spouse reports pt received Dialysis this am, was feeling weak after treatment, spouse reports dialysis unit informed them today her HR and BP were low. Spouse reports he was outside feeding the animals, came in to get ready to go out, pt met him at the door way handed him the keys went back to go sit down in her chair and she collapsed to the floor and was not breathing, he immediately called 911

## 2012-01-21 NOTE — Code Documentation (Addendum)
Per EMS spouse reports approx 1830 he came into house found pt on the floor w/trouble breathing asking spouse to help her up, while assisting pt up pt's breathing changed to agonal respirations and became unresponsive, spouse then called 911.  EMS reports they received dispatch @ 1842 arriving on scene @ 1850 pt found unresponsive, no palpable pulses, PEA on monitor, EMS started chest compressions approx 1855, established an I/O to (R) [place] then administered Epi 0.1 mg @ 1905, w/a return of pulses and no response to sternal rub, Versed 2.5 mg administered and ice cold NS started @ 1906, pt began moving en route and responding to sternal rub, ice cold NS dc'd and NS started, SBP 118. ETT 7.0 placed en route by [facility] w/assisted respirations via bag/mask, placement verified by 2 paramedics by capnography, bilateral breath sounds, equal visualized rise and fall of chest. BP decreased to SBP 80, Epi drip started @ 4 mcg  2 12 leads obtained en route Pt arrives on LSB, C-collar in place

## 2012-01-21 NOTE — Code Documentation (Signed)
Levophed decreased to 5 mcg/min BP 186/105

## 2012-01-21 NOTE — Code Documentation (Signed)
Family at beside. Family given emotional support. Spouse updated on pt's condition and the need for further evaluation by a Cardiologist.

## 2012-01-21 NOTE — Code Documentation (Signed)
[name];;Husband [phone], please call with any results

## 2012-01-22 ENCOUNTER — Inpatient Hospital Stay (HOSPITAL_COMMUNITY): Payer: Medicare Other

## 2012-01-22 DIAGNOSIS — I469 Cardiac arrest, cause unspecified: Secondary | ICD-10-CM

## 2012-01-22 DIAGNOSIS — A419 Sepsis, unspecified organism: Principal | ICD-10-CM | POA: Diagnosis present

## 2012-01-22 DIAGNOSIS — Z992 Dependence on renal dialysis: Secondary | ICD-10-CM

## 2012-01-22 DIAGNOSIS — R6521 Severe sepsis with septic shock: Secondary | ICD-10-CM

## 2012-01-22 DIAGNOSIS — J9600 Acute respiratory failure, unspecified whether with hypoxia or hypercapnia: Secondary | ICD-10-CM

## 2012-01-22 DIAGNOSIS — G9340 Encephalopathy, unspecified: Secondary | ICD-10-CM | POA: Diagnosis present

## 2012-01-22 LAB — CBC
HCT: 36.4 % (ref 36.0–46.0)
HCT: 35.8 % — ABNORMAL LOW (ref 36.0–46.0)
Hemoglobin: 11.8 g/dL — ABNORMAL LOW (ref 12.0–15.0)
Hemoglobin: 11.6 g/dL — ABNORMAL LOW (ref 12.0–15.0)
MCH: 29.8 pg (ref 26.0–34.0)
MCV: 91.9 fL (ref 78.0–100.0)
MCV: 90.6 fL (ref 78.0–100.0)
RBC: 3.96 MIL/uL (ref 3.87–5.11)
RBC: 3.95 MIL/uL (ref 3.87–5.11)
WBC: 25 10*3/uL — ABNORMAL HIGH (ref 4.0–10.5)

## 2012-01-22 LAB — POCT I-STAT 3, ART BLOOD GAS (G3+)
Acid-base deficit: 12 mmol/L — ABNORMAL HIGH (ref 0.0–2.0)
Acid-base deficit: 9 mmol/L — ABNORMAL HIGH (ref 0.0–2.0)
O2 Saturation: 99 %
Patient temperature: 36.7
Patient temperature: 38
pCO2 arterial: 32 mmHg — ABNORMAL LOW (ref 35.0–45.0)

## 2012-01-22 LAB — MRSA PCR SCREENING: MRSA by PCR: NEGATIVE

## 2012-01-22 LAB — BASIC METABOLIC PANEL
BUN: 62 mg/dL — ABNORMAL HIGH (ref 6–23)
CO2: 13 mEq/L — ABNORMAL LOW (ref 19–32)
Chloride: 95 mEq/L — ABNORMAL LOW (ref 96–112)
Creatinine, Ser: 5.59 mg/dL — ABNORMAL HIGH (ref 0.50–1.10)
Potassium: 4.2 mEq/L (ref 3.5–5.1)

## 2012-01-22 LAB — TROPONIN I
Troponin I: <0.3 ng/mL (ref <0.30)
Troponin I: <0.3 ng/mL (ref <0.30)
Troponin I: <0.3 ng/mL (ref <0.30)

## 2012-01-22 LAB — GLUCOSE, CAPILLARY
Glucose-Capillary: 338 mg/dL — ABNORMAL HIGH (ref 70–99)
Glucose-Capillary: 279 mg/dL — ABNORMAL HIGH (ref 70–99)
Glucose-Capillary: 60 mg/dL — ABNORMAL LOW (ref 70–99)
Glucose-Capillary: 72 mg/dL (ref 70–99)

## 2012-01-22 MED ORDER — SODIUM BICARBONATE 8.4 % IV SOLN
INTRAVENOUS | Status: DC
  Administered 2012-01-22: 12:00:00 via INTRAVENOUS
  Filled 2012-01-22 (×3): qty 150

## 2012-01-22 MED ORDER — DEXTROSE 5 % IV SOLN
2.0000 ug/min | INTRAVENOUS | Status: DC
  Administered 2012-01-22: 10 ug/min via INTRAVENOUS
  Administered 2012-01-23: 5 ug/min via INTRAVENOUS
  Filled 2012-01-22 (×2): qty 8

## 2012-01-22 MED ORDER — PANTOPRAZOLE SODIUM 40 MG IV SOLR
40.0000 mg | INTRAVENOUS | Status: DC
  Administered 2012-01-23 – 2012-01-25 (×3): 40 mg via INTRAVENOUS
  Filled 2012-01-22 (×4): qty 40

## 2012-01-22 MED ORDER — MIDAZOLAM BOLUS VIA INFUSION
1.0000 mg | INTRAVENOUS | Status: DC | PRN
  Administered 2012-01-22: 0 mg/h via INTRAVENOUS
  Administered 2012-01-22: 1 mg via INTRAVENOUS
  Filled 2012-01-22: qty 2

## 2012-01-22 MED ORDER — DEXTROSE 50 % IV SOLN: 25.0000 mL | Freq: Once | INTRAVENOUS | Status: AC | PRN

## 2012-01-22 MED ORDER — HEPARIN SODIUM (PORCINE) 5000 UNIT/ML IJ SOLN
5000.0000 [IU] | Freq: Three times a day (TID) | INTRAMUSCULAR | Status: DC
  Administered 2012-01-22 – 2012-01-24 (×7): 5000 [IU] via SUBCUTANEOUS
  Filled 2012-01-22 (×11): qty 1

## 2012-01-22 MED ORDER — NOREPINEPHRINE BITARTRATE 1 MG/ML IJ SOLN
2.0000 ug/min | INTRAVENOUS | Status: DC
  Administered 2012-01-22: 4 ug/min via INTRAVENOUS
  Filled 2012-01-22: qty 4

## 2012-01-22 MED ORDER — BIOTENE DRY MOUTH MT LIQD
15.0000 mL | Freq: Four times a day (QID) | OROMUCOSAL | Status: DC
  Administered 2012-01-22 – 2012-01-27 (×21): 15 mL via OROMUCOSAL

## 2012-01-22 MED ORDER — VANCOMYCIN HCL IN DEXTROSE 1-5 GM/200ML-% IV SOLN
1000.0000 mg | INTRAVENOUS | Status: DC
  Administered 2012-01-24 (×2): 1000 mg via INTRAVENOUS
  Filled 2012-01-22: qty 200

## 2012-01-22 MED ORDER — SODIUM CHLORIDE 0.9 % IV SOLN
INTRAVENOUS | Status: DC
  Administered 2012-01-22: 2.8 [IU]/h via INTRAVENOUS
  Filled 2012-01-22: qty 1

## 2012-01-22 MED ORDER — FENTANYL CITRATE 0.05 MG/ML IJ SOLN
50.0000 ug/h | INTRAMUSCULAR | Status: DC
  Filled 2012-01-22: qty 50

## 2012-01-22 MED ORDER — DEXTROSE 10 % IV SOLN
INTRAVENOUS | Status: DC | PRN
  Administered 2012-01-22: 0.675 mg/kg/min via INTRAVENOUS

## 2012-01-22 MED ORDER — ALBUTEROL SULFATE HFA 108 (90 BASE) MCG/ACT IN AERS
4.0000 | INHALATION_SPRAY | RESPIRATORY_TRACT | Status: DC | PRN
  Filled 2012-01-22: qty 6.7

## 2012-01-22 MED ORDER — INSULIN GLARGINE 100 UNIT/ML ~~LOC~~ SOLN
15.0000 [IU] | SUBCUTANEOUS | Status: DC
  Administered 2012-01-22: 15 [IU] via SUBCUTANEOUS

## 2012-01-22 MED ORDER — SODIUM CHLORIDE 0.9 % IV SOLN
250.0000 mg | Freq: Once | INTRAVENOUS | Status: AC
  Administered 2012-01-22: 250 mg via INTRAVENOUS
  Filled 2012-01-22: qty 250

## 2012-01-22 MED ORDER — VANCOMYCIN HCL 1000 MG IV SOLR
1750.0000 mg | Freq: Once | INTRAVENOUS | Status: AC
  Administered 2012-01-22: 1750 mg via INTRAVENOUS
  Filled 2012-01-22: qty 1750

## 2012-01-22 MED ORDER — CHLORHEXIDINE GLUCONATE 0.12 % MT SOLN
15.0000 mL | Freq: Two times a day (BID) | OROMUCOSAL | Status: DC
  Administered 2012-01-22 – 2012-01-27 (×11): 15 mL via OROMUCOSAL
  Filled 2012-01-22 (×13): qty 15

## 2012-01-22 MED ORDER — FENTANYL BOLUS VIA INFUSION
50.0000 ug | Freq: Four times a day (QID) | INTRAVENOUS | Status: DC | PRN
  Filled 2012-01-22: qty 100

## 2012-01-22 MED ORDER — INSULIN ASPART 100 UNIT/ML ~~LOC~~ SOLN: 2.0000 [IU] | SUBCUTANEOUS | Status: DC

## 2012-01-22 MED ORDER — SODIUM CHLORIDE 0.9 % IV SOLN
250.0000 mg | Freq: Two times a day (BID) | INTRAVENOUS | Status: DC
  Administered 2012-01-22 – 2012-01-27 (×9): 250 mg via INTRAVENOUS
  Filled 2012-01-22 (×12): qty 250

## 2012-01-22 MED ORDER — SODIUM CHLORIDE 0.9 % IV SOLN: 250.0000 mL | INTRAVENOUS | Status: DC | PRN

## 2012-01-22 NOTE — Procedures (Addendum)
Arterial Catheter Insertion Procedure Note [name] [id number] Apr 12, 1961  Procedure: Insertion of Arterial Catheter  Indications: Blood pressure monitoring  Procedure Details Consent: Unable to obtain consent because of emergent medical necessity. Time Out: Verified patient identification, verified procedure, site/side was marked, verified correct patient position, special equipment/implants available, medications/allergies/relevent history reviewed, required imaging and test results available.  Performed  Maximum sterile technique was used including antiseptics, cap, gloves, gown, hand hygiene, mask and sheet. Skin prep: Chlorhexidine; local anesthetic administered 20 gauge catheter was inserted into right radial artery using the Seldinger technique.  Evaluation Blood flow good; BP tracing good. Complications: No apparent complications.  right radial artery x2 attempts, good blood flow, good reading on monitor. Supervised by Jonny Ruiz Day, RRT.  [place] 01/22/2012

## 2012-01-22 NOTE — Progress Notes (Signed)
Chaplain Note:  Chaplain visited with pt and pt's family.  Pt was in trauma bay being treated by [facility] staff.  Chaplain provided spiritual comfort, and support, for pt's family.  Chaplain will follow up as needed.  Verdie Shire, Chaplain [phone]

## 2012-01-22 NOTE — ED Notes (Addendum)
Both BC obtained; notified pharmacy for missing abx doses

## 2012-01-22 NOTE — Progress Notes (Signed)
Wasted 10cc of Versed in sewer system.  Witnessed by Eloise Harman, RN.

## 2012-01-22 NOTE — Progress Notes (Signed)
  Echocardiogram 2D Echocardiogram has been performed.  [name] 01/22/2012, 4:33 PM

## 2012-01-22 NOTE — H&P (Addendum)
Name: [name] MRN: [id number] DOB: 10-28-1960    LOS: 1  PULMONARY / CRITICAL CARE MEDICINE  HPI:   51 years old female with PMH relevant for ESRD on HD three times per week, IDDM, HTN, PVD post left BKA. Had carpal tunnel release surgery about two weeks ago. Was in her usual state of health today. Had dialysis this morning and low blood pressure was documented during HD. She was feeding the animals and came into the house saying that she was not feeling well. Immediately after she collapsed and had agonal breathing. No CPR was initiated by family. EMS was called and arrived in about 10 minutes. Initial rhythm was PEA. CPR and ACLS was initiated with return to spontaneous circulation after 10 minutes. Hypothermia protocol was initiated on route but discontinued because the patient started responding. (details are unclear). A left femoral CVC was placed by ED physicians. At the time of my exam the patient is intubated, had some PRN sedation. BP is slightly elevated on levophed drip. Slightly opens her eyes on painful stimuli. Moves both arms but seems to prefer left side.  Past Medical History  Diagnosis Date  . Hypertension   . Anemia   . Diabetes mellitus   . Peripheral vascular disease   . Hemodialysis patient 03/15/11    "Tues; Thurs; Sat; [facility]"  . Blood transfusion   . Chronic back pain   . Rash 03/15/11    "admitted me to find out what its' from; UE, waist, thighs, groin"  . Renal failure     hd 4/12   Past Surgical History  Procedure Date  . Insertion of dialysis catheter 07/29/10    right subclavian  . Arteriovenous graft placement 10/01/10    left upper arm  . Below knee leg amputation 2004    left  . Amputation 03/04/2011    Procedure: AMPUTATION DIGIT;  Surgeon: [name], MD;  Location: [facility] OR;  Service: Orthopedics;  Laterality: Right;  RT GREAT TOE AMP  . Cataract extraction w/ intraocular lens implant 12/2009    right eye  . Carpal  tunnel release ~ 2009    left wrist  . Tee without cardioversion 03/18/2011    Procedure: TRANSESOPHAGEAL ECHOCARDIOGRAM (TEE);  Surgeon: [name], MD;  Location: [facility] ENDOSCOPY;  Service: Cardiovascular;  Laterality: N/A;   Prior to Admission medications   Medication Sig Start Date End Date Taking? Authorizing Provider  amLODipine (NORVASC) 10 MG tablet Take 1 tablet (10 mg total) by mouth at bedtime. 03/21/11 03/20/12  Ripudeep Jenna Luo, MD  calcium acetate, Phos Binder, (PHOSLYRA) 667 MG/5ML SOLN Take 1,334 mg by mouth 3 (three) times daily with meals.      Historical Provider, MD  insulin aspart (NOVOLOG) 100 UNIT/ML injection Inject 8 Units into the skin daily.     Historical Provider, MD  insulin glargine (LANTUS) 100 UNIT/ML injection Inject 12 Units into the skin at bedtime.     Historical Provider, MD   Allergies Allergies  Allergen Reactions  . Codeine   . Levofloxacin   . Penicillins     Family History History reviewed. No pertinent family history. Social History  reports that she has never smoked. She has never used smokeless tobacco. She reports that she does not drink alcohol or use illicit drugs.  Review Of Systems:  Unable to provide.  Current Status:  Vital Signs: Temp:  [94.6 F (34.8 C)-96.1 F (35.6 C)] 94.6 F (34.8 C) (09/28 2330) Pulse  Rate:  [62-131] 108  (09/28 2330) Resp:  [10-25] 25  (09/28 2330) BP: (37-193)/(17-140) 184/118 mmHg (09/28 2330) SpO2:  [89 %-100 %] 100 % (09/28 2330) FiO2 (%):  [60 %] 60 % (09/28 2000)  Physical Examination: General:  Intubated, mechanically ventilated, no acute distress Neuro:  Sedated, synchronous, Moves both arms, seems to prefer left side. Anisocoria L>R HEENT:  PERRL, pink conjunctivae, moist membranes Neck:  Supple, no JVD   Cardiovascular:  RRR, no M/R/G Lungs:  Adequate air entry, scattered crackles bilaterally Abdomen:  Soft, nontender, nondistended, bowel sounds present Musculoskeletal:  Left BKA.  Left hand covered after carpal tunnel release surgery. AV fistula on left arm. No pedal edema Skin:  No rash    ASSESSMENT AND PLAN  PULMONARY  Lab 01/21/12 2010  PHART 7.053*  PCO2ART 49.3*  PO2ART 258.0*  HCO3 13.8*  O2SAT 100.0   Ventilator Settings: Vent Mode:  [-] PRVC FiO2 (%):  [60 %] 60 % Set Rate:  [18 bmp] 18 bmp Vt Set:  [460 mL] 460 mL PEEP:  [5 cmH20] 5 cmH20 Plateau Pressure:  [26 cmH20] 26 cmH20 CXR:  Bilateral alveolar infiltrates.  ETT:  Adequate position.  A:  1) Post cardiac arrest, unclear etiology 2) Hypothermia protocol suspended due to patient improved mental status during transfer. 3) Acute respiratory failure secondary to cardiac arrest 4) Bilateral lung infiltrates, likely edema. P:   - Admit to ICU - Mechanical ventilation   - PRVC, Vt: 8cc/kg, PEEP: 5, RR: 22, FiO2 40% - VAP prevention order set. - Albuterol PRN  CARDIOVASCULAR  Lab 01/21/12 2200  TROPONINI --  LATICACIDVEN 9.1*  PROBNP --   ECG:  Initial EKG at [facility] showed nodal rhythm with HR of 48, borderline ST depression and T wave inversion in V4-V6 and inferior leads. Will repeat EKG.  Lines: Left femoral line placed by ED physicians. 01/21/12  A:  1) Post cardiac arrest 2) Lactic acidosis 3) Shock (cardiogenic vs septic)  4) Negative troponin P:  - Continue levophed and titrate to MAP of 65 - Will get echocardiogram in am - Insert arterial line. - Follow troponin x 3 - No hypothermia protocol due to patient responsiveness.  RENAL  Lab 01/21/12 2016 01/21/12 2010  NA 137 136  K 4.8 4.9  CL 104 94*  CO2 -- 15*  BUN 74* 60*  CREATININE 5.00* 5.62*  CALCIUM -- 7.9*  MG -- --  PHOS -- --   Intake/Output    None      A:   1) ESRD on HD via AV fistula 2) Had dialysis today (Tuesday, Thursday and Saturday) P:   - Will consult nephrology in am for HD  GASTROINTESTINAL No results found for this basename: AST:5,ALT:5,ALKPHOS:5,BILITOT:5,PROT:5,ALBUMIN:5 in the  last 168 hours  A:   1) No issues P:   - NGT - NPO - Protonix  HEMATOLOGIC  Lab 01/21/12 2016 01/21/12 2010  HGB 13.9 12.3  HCT 41.0 38.6  PLT -- 147*  INR -- --  APTT -- --   A:   1) No issues P:  - Monitor CBC  INFECTIOUS  Lab 01/21/12 2010  WBC 12.1*  PROCALCITON --   Cultures: Blood cultures ordered. Antibiotics: Imipenem and Vancomycin (penicillin allergy)  A:   1) Reported hypotension during dialysis. Now on levophed. Cannot rule out sepsis. P:   - Will start Imipenem and Vancomycin - Will follow cultures.  ENDOCRINE No results found for this basename: GLUCAP:5 in the last 168 hours  A:   1) IDDM  P:   - ICU hyperglycemia protocol  NEUROLOGIC  A:   1) Post cardiac arrest. Had PRN sedation before my exam. Opening her eyes with painful stimuli. Moving both arms but seems to prefer left side. Unequal pupils. P:   - Will get STAT CT of the head. - Continuous sedation with Versed and Fentanyl - Daily awakening  BEST PRACTICE / DISPOSITION - Level of Care:  ICU - Primary Service:  PCCM - Consultants:  Nephrology in am - Code Status:  Full code - Diet:  NPO - DVT Px:  Heparin - GI Px:  Protonix - Skin Integrity:  No decubitus ulcers  - Social / Family:  Family updated at bedside.  The patient is critically ill with multiple organ systems failure and requires high complexity decision making for assessment and support, frequent evaluation and titration of therapies, application of advanced monitoring technologies and extensive interpretation of multiple databases.   Critical Care Time devoted to patient care services described in this note is: 1 Hour  Overton Mam, M.D. Pulmonary and Critical Care Medicine [facility] Pager: [phone]  01/22/2012, 12:25 AM

## 2012-01-22 NOTE — ED Notes (Signed)
Report given to 2300; RN aware that was unable to give abx d/t had not received from pharmacy yet, and ED secretary to send once they arrive

## 2012-01-22 NOTE — ED Notes (Signed)
Levophed decreased to 4 mcg/min per VO by admitting MD

## 2012-01-22 NOTE — Progress Notes (Signed)
ANTIBIOTIC CONSULT NOTE - INITIAL  Pharmacy Consult for Vancocin and Primaxin Indication: rule out sepsis s/p cardiac arrest  Allergies  Allergen Reactions  . Codeine   . Levofloxacin   . Penicillins     Patient Measurements: Height: 5\' 3"  (160 cm) Weight: 217 lb 13 oz (98.8 kg) IBW/kg (Calculated) : 52.4   Vital Signs: Temp: 95.5 F (35.3 C) (09/29 0115) Temp src: Core (Comment) (09/29 0111) BP: 141/68 mmHg (09/29 0115) Pulse Rate: 102  (09/29 0115)  Labs:  Basename 01/22/12 0056 01/21/12 2016 01/21/12 2010  WBC 25.6* -- 12.1*  HGB 11.8* 13.9 12.3  PLT 171 -- 147*  LABCREA -- -- --  CREATININE 5.53* 5.00* 5.62*   Estimated Creatinine Clearance: 13.8 ml/min (by C-G formula based on Cr of 5.53).  Microbiology: No results found for this or any previous visit (from the past 720 hour(s)).  Medical History: Past Medical History  Diagnosis Date  . Hypertension   . Anemia   . Diabetes mellitus   . Peripheral vascular disease   . Hemodialysis patient 03/15/11    "Tues; Thurs; Sat; [facility]"  . Blood transfusion   . Chronic back pain   . Rash 03/15/11    "admitted me to find out what its' from; UE, waist, thighs, groin"  . Renal failure     hd 4/12    Medications:  Prescriptions prior to admission  Medication Sig Dispense Refill  . amLODipine (NORVASC) 10 MG tablet Take 1 tablet (10 mg total) by mouth at bedtime.  30 tablet  3  . calcium acetate, Phos Binder, (PHOSLYRA) 667 MG/5ML SOLN Take 1,334 mg by mouth 3 (three) times daily with meals.        . insulin aspart (NOVOLOG) 100 UNIT/ML injection Inject 8 Units into the skin daily.       . insulin glargine (LANTUS) 100 UNIT/ML injection Inject 12 Units into the skin at bedtime.        Scheduled:    . calcium gluconate 1 GM IV  1 g Intravenous Once  . dextrose      . epinephrine  0.5-20 mcg/min Intravenous To ER  . fentaNYL  50 mcg Intravenous Once  . heparin  5,000 Units Subcutaneous  Q8H  . imipenem-cilastatin  250 mg Intravenous Once  . imipenem-cilastatin  250 mg Intravenous Q12H  . insulin aspart  2-6 Units Subcutaneous Q4H  . midazolam  1 mg Intravenous Once  . norepinephrine      . pantoprazole (PROTONIX) IV  40 mg Intravenous Q24H  . vancomycin  1,750 mg Intravenous Once  . vancomycin  1,000 mg Intravenous Q T,Th,Sa-HD    Assessment: 51yo female had HD in the am with hypotension, at home she said she didn't feel well and collapsed, family called EMS immediately but did not begin CPR, initial rhythm PEA, CPR/ACLS resulted in ROSC after ten minutes, unclear etiology of cardiac arrest, to begin IV ABX for possible sepsis.  Goal of Therapy:  Pre-HD level 15-25  Plan:  Vancomycin 1750mg  IV and Primaxin 250mg  IV x1 ordered; will continue with vancomycin 1000mg  IV after each HD and Primaxin 250mg  IV Q12H and monitor CBC, Cx, levels prn.  Colleen Can PharmD BCPS 01/22/2012,2:35 AM

## 2012-01-22 NOTE — Consult Note (Signed)
CARDIOLOGY CONSULT NOTE  Patient ID: [name]  MRN: [id number]  DOB: December 03, 1960  Admit date: 01/21/2012 Date of Consult: 01/22/2012  Primary Physician: Sheila Oats, MD Primary Cardiologist:  Chipper Oman Electrophysiologist:  None   Reason for Consultation: PEA arrest  History of Present Illness: [name] is a 51 y.o. 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    She was admitted to the hospital yesterday after suffering cardiac arrest. She developed respiratory failure at home. EMS was summoned and she was found to be in PEA. She had return of spontaneous circulation with intraosseous epinephrine. She presented to the emergency room with bradycardia and hypotension. Initial ECG demonstrated sinus bradycardia with heart rates in the 40s with inferolateral T wave inversions. She remains intubated. Initial ABGs were consistent with metabolic acidosis. This is improving. Lactic acid was initially 9. She is being followed by critical care as well as nephrology for ongoing hemodialysis. There is some concern for sepsis as a cause of her arrest due to possible infection of her right great toe site and/or her left middle finger site. Cardiology is asked to evaluate due to her presentation with PEA arrest. Of note, when she was admitted in 11/12 with fungal sepsis she had a TEE performed. By Dr Shirlee Latch  This demonstrated normal LV function, mild TR and no vegetation.   Past Medical History  Diagnosis Date  . Hypertension   . Anemia   . Diabetes mellitus   . Peripheral vascular disease   . Hemodialysis patient 03/15/11    "Tues; Thurs; Sat; [facility]"    . Blood transfusion   . Chronic back pain   . Rash 03/15/11    "admitted me to find out what its' from; UE, waist, thighs, groin"  . Renal failure     hd 4/12    Past Surgical History  Procedure Date  . Insertion of dialysis catheter 07/29/10    right subclavian  . Arteriovenous graft placement 10/01/10    left upper arm  . Below knee leg amputation 2004    left  . Amputation 03/04/2011    Procedure: AMPUTATION DIGIT;  Surgeon: [name], MD;  Location: [facility] OR;  Service: Orthopedics;  Laterality: Right;  RT GREAT TOE AMP  . Cataract extraction w/ intraocular lens implant 12/2009    right eye  . Carpal tunnel release ~ 2009    left wrist  . Tee without cardioversion 03/18/2011    Procedure: TRANSESOPHAGEAL ECHOCARDIOGRAM (TEE);  Surgeon: [name], MD;  Location: [facility] ENDOSCOPY;  Service: Cardiovascular;  Laterality: N/A;     Home Meds: Prior to Admission medications   Medication Sig Start Date End Date Taking? Authorizing Provider  amLODipine (NORVASC) 10 MG tablet Take 1 tablet (10 mg total) by mouth at bedtime. 03/21/11 03/20/12  Ripudeep Jenna Luo, MD  calcium acetate, Phos Binder, (PHOSLYRA) 667 MG/5ML SOLN Take 1,334 mg by mouth 3 (three) times daily with meals.      Historical Provider, MD  insulin aspart (NOVOLOG) 100 UNIT/ML injection Inject 8 Units into the skin daily.     Historical Provider, MD  insulin glargine (LANTUS) 100 UNIT/ML injection Inject 12 Units into the  skin at bedtime.     Historical Provider, MD      Allergies: Allergies  Allergen Reactions  . Codeine   . Levofloxacin   . Penicillins      History  Substance Use Topics  . Smoking status: Never Smoker   . Smokeless tobacco: Never Used  . Alcohol Use: No      History reviewed. No pertinent family history.    ROS:  Please see the history of present illness.   Cannot obtain as patient is sedated and ventilated.  All other systems reviewed and negative.    Vital Signs: Blood pressure  96/38, pulse 89, temperature 100.6 F (38.1 C), temperature source Core (Comment), resp. rate 18, height 5\' 3"  (1.6 m), weight 217 lb 13 oz (98.8 kg), SpO2 100.00%.   PHYSICAL EXAM: General:  Well-nourished female currently sedated on the ventilator HEENT: normal Lymph: no adenopathy Neck: no JVD  Right IJ  Endocrine:  No thryomegaly Cardiac:  normal S1, S2; RRR; no murmur; no rub Lungs:  clear to auscultation bilaterally, no wheezing, rhonchi or rales Abd: soft Ext: no edema; left middle finger amputation site with some slight erythema Musculoskeletal:  Left BKA site intact Skin: warm and dry Neuro:  Sedated  Labs:  Basename 01/22/12 0450 01/22/12 0056  CKTOTAL -- --  CKMB -- --  TROPONINI <0.30 <0.30   Lab Results  Component Value Date   WBC 25.0* 01/22/2012   HGB 11.6* 01/22/2012   HCT 35.8* 01/22/2012   MCV 90.6 01/22/2012   PLT 177 01/22/2012     Lab 01/22/12 0450  NA 134*  K 4.2  CL 95*  CO2 13*  BUN 62*  CREATININE 5.59*  CALCIUM 6.9*  PROT --  BILITOT --  ALKPHOS --  ALT --  AST --  GLUCOSE 375*   Lab Results  Component Value Date   CHOL 195 03/15/2011   HDL 43 03/15/2011   LDLCALC 130* 03/15/2011   TRIG 108 03/15/2011    Lactic acid 9.1 => 5.6 Procalcitonin 7.94 4 Blood cultures pending   Radiology/Studies:  Ct Head Wo Contrast  01/22/2012  *RADIOLOGY REPORT*  Clinical Data: Post cardiac arrest.  CT HEAD WITHOUT CONTRAST  Technique:  Contiguous axial images were obtained from the base of the skull through the vertex without contrast.  Comparison: None.  Findings: No acute intracranial hemorrhage.  No focal mass lesion. No CT evidence of acute infarction.   No midline shift or mass effect.  No hydrocephalus.  Basilar cisterns are patent.  Mild cortical atrophy.  Vessel calcification is noted.  Mild periventricular white matter hypodensities.  There is a fluid in the posterior nasopharynx related to intubation. Mastoid air cells are clear.    IMPRESSION:  1.  No acute intracranial findings.  2.   Atrophy  and microvascular disease.   Original Report Authenticated By: Genevive Bi, M.D.    Dg Chest Port 1 View  01/22/2012  *RADIOLOGY REPORT*  Clinical Data: Evaluate endotracheal tube position and lung fields.  PORTABLE CHEST - 1 VIEW  Comparison: 1 day prior  Findings: Endotracheal tube terminates 2.1 cm above carina. Nasogastric extends beyond the  inferior aspect of the film.  Cardiomegaly accentuated by AP portable technique.  No pleural effusion or pneumothorax.  Improved inspiratory effort.  Resolved interstitial edema.  Mildly low lung volumes remain, with resultant pulmonary interstitial prominence.  Mild right infrahilar volume loss/atelectasis.  IMPRESSION:  1.  Cardiomegaly with improved aeration secondary to resolved interstitial edema. 2.  Endotracheal tube 2.1 cm above carina. Recommend attention on follow-up.   Original Report Authenticated By: Consuello Bossier, M.D.    Dg Chest Portable 1 View  01/21/2012  *RADIOLOGY REPORT*  Clinical Data: Cardiac arrest and non responsive.  PORTABLE CHEST - 1 VIEW  Comparison: 01/21/2012  Findings: Endotracheal tube has been slightly pulled back and roughly 1.5 cm above the carina.  The patient has increased densities throughout the lungs.  Findings are concerning for volume loss and edema.  Again noted are defibrillator pads overlying the left hemithorax.  Nasogastric tube extends into the abdomen.  IMPRESSION: Endotracheal tube has been slightly pulled back and located just above the carina.  Increased densities throughout both lungs are concerning for low lung volumes and worsening interstitial edema.   Original Report Authenticated By: Richarda Overlie, M.D.    Dg Chest Portable 1 View  01/21/2012  *RADIOLOGY REPORT*  Clinical Data: Evaluate endotracheal tube placement.  PORTABLE CHEST - 1 VIEW  Comparison: 02/26/2009  Findings: Endotracheal tube extends into the proximal right mainstem bronchus.   Nasogastric tube extends in the abdomen.  There is a defibrillator pad overlying the left hemithorax.  Heart size is upper limits of normal. Prominent interstitial markings are suggestive for volume loss and cannot exclude mild edema.  Negative for a pneumothorax.  IMPRESSION: Endotracheal tube in the proximal right mainstem bronchus. Findings discussed with Dr. Rulon Abide on 01/21/2012 at 9:22 p.m.  Low lung volumes with prominent interstitial markings.   Original Report Authenticated By: Richarda Overlie, M.D.     ASSESSMENT AND PLAN:  1. PEA Arrest: Low suspicion for acute coronary syndrome. Given her presentation, sepsis seems to be the most likely on the differential. She remains on pressors for blood pressure support. She is also on empiric antibiotics for possible sepsis. She does have an abnormal ECG. When compared to prior ECGs, the inferolateral T wave inversions are new. However, her cardiac markers have been normal. Obtain an echocardiogram to assess LV function. If her blood cultures return positive she may need a transesophageal echocardiogram. Further recommendations to follow.  2. End-Stage Renal Disease: Management per nephrology.  Do not run so dry given need for pressers  3. Ventilator Dependent Respiratory Failure: Management per Critical Care. CXR improving   Signed, Tereso Newcomer, PA-C 01/22/2012, 12:11 PM   Patient examined and chart reviewed.  Suspect rhythm abnormalities ppt by sepsis, CRF and acidosis.  Unusual to have negative troponin in CRF patient but agree no signs of acute coronary syndrome.  EF normal by TEE  11/12 and will check TTE to see what it is now.  Continue supportive vent care, broad coverage antibiotics and dialysis  Charlton Haws 1:16 PM 01/22/2012

## 2012-01-22 NOTE — Procedures (Signed)
Central Line Placement with Ultrasound Guidance Procedure Note    Indication: Post arrest hypotension    Consent: Unable to be obtained due to the emergent nature of this procedure.    Procedure: The patient was positioned appropriately and the skin over the L femoral vein was prepped with chlorhexadine and draped in a sterile fashion after visualization of the target with ultrasound. Local anesthesia wasnot performed due to the emergent nature of this procedure.  Under direct ultrasound guidance, a large bore needle was used to puncture the vein which drew back dark, non-pulsatile blood.  A guide wire was then inserted into the vein through the needle. A triple lumen catheter was then inserted into the vessel over the guide wire using the Seldinger technique.  All ports showed good, free flowing blood return and were flushed with saline solution.  The catheter was then securely fastened to the skin with sutures and covered with a sterile dressing.     The patient tolerated the procedure well    Complications: None

## 2012-01-22 NOTE — Consult Note (Signed)
[facility] Renal Consultation Note  Indication for Consultation:  Management of ESRD/hemodialysis; anemia, hypertension/volume and secondary hyperparathyroidism  HPI: [name] is a 51 y.o. female with ESRD on dialysis at [facility] on TTS who had her regularly scheduled treatment yesterday, noted only for low blood pressures, but during the evening was found by her husband sitting in the floor and unable to explain what had happened.  When he attempted to help her up, she collapsed and was unresponsive.  EMS was called and arrived within ten minutes to find her initial rhythm to be PEA, at which time CPR and ACLS was initiated with a return to spontaneous circulation.  She received a left femoral CVC and was intubated.  Currently she is intubated, on pressors, and sedated, but her husband was present and provided information.  Dialysis Orders:  Pending  Past Medical History  Diagnosis Date  . Hypertension   . Anemia   . Diabetes mellitus   . Peripheral vascular disease   . Hemodialysis patient 03/15/11    "Tues; Thurs; Sat; [facility]"  . Blood transfusion   . Chronic back pain   . Rash 03/15/11    "admitted me to find out what its' from; UE, waist, thighs, groin"  . Renal failure     hd 4/12   Past Surgical History  Procedure Date  . Insertion of dialysis catheter 07/29/10    right subclavian  . Arteriovenous graft placement 10/01/10    left upper arm  . Below knee leg amputation 2004    left  . Amputation 03/04/2011    Procedure: AMPUTATION DIGIT;  Surgeon: [name], MD;  Location: [facility] OR;  Service: Orthopedics;  Laterality: Right;  RT GREAT TOE AMP  . Cataract extraction w/ intraocular lens implant 12/2009    right eye  . Carpal tunnel release ~ 2009    left wrist  . Tee without cardioversion 03/18/2011    Procedure: TRANSESOPHAGEAL ECHOCARDIOGRAM (TEE);  Surgeon: [name], MD;  Location: [facility] ENDOSCOPY;  Service: Cardiovascular;   Laterality: N/A;   History reviewed. No pertinent family history.  Social History Her husband reports that she has never used tobacco, alcohol, or illicit drugs.  She worked previously as a Arboriculturist for [employer] and currently lives with her husband.  They have no children.  Allergies  Allergen Reactions  . Codeine   . Levofloxacin   . Penicillins    Prior to Admission medications   Medication Sig Start Date End Date Taking? Authorizing Provider  amLODipine (NORVASC) 10 MG tablet Take 1 tablet (10 mg total) by mouth at bedtime. 03/21/11 03/20/12  Ripudeep Jenna Luo, MD  calcium acetate, Phos Binder, (PHOSLYRA) 667 MG/5ML SOLN Take 1,334 mg by mouth 3 (three) times daily with meals.      Historical Provider, MD  insulin aspart (NOVOLOG) 100 UNIT/ML injection Inject 8 Units into the skin daily.     Historical Provider, MD  insulin glargine (LANTUS) 100 UNIT/ML injection Inject 12 Units into the skin at bedtime.     Historical Provider, MD   Labs:  Results for orders placed during the hospital encounter of 01/21/12 (from the past 48 hour(s))  BASIC METABOLIC PANEL     Status: Abnormal   Collection Time   01/21/12  8:10 PM      Component Value Range Comment   Sodium 136  135 - 145 mEq/L    Potassium 4.9  3.5 - 5.1 mEq/L  Chloride 94 (*) 96 - 112 mEq/L    CO2 15 (*) 19 - 32 mEq/L    Glucose, Bld 277 (*) 70 - 99 mg/dL    BUN 60 (*) 6 - 23 mg/dL    Creatinine, Ser 1.61 (*) 0.50 - 1.10 mg/dL    Calcium 7.9 (*) 8.4 - 10.5 mg/dL    GFR calc non Af Amer 8 (*) >90 mL/min    GFR calc Af Amer 9 (*) >90 mL/min   CBC WITH DIFFERENTIAL     Status: Abnormal   Collection Time   01/21/12  8:10 PM      Component Value Range Comment   WBC 12.1 (*) 4.0 - 10.5 K/uL    RBC 4.17  3.87 - 5.11 MIL/uL    Hemoglobin 12.3  12.0 - 15.0 g/dL    HCT 09.6  04.5 - 40.9 %    MCV 92.6  78.0 - 100.0 fL    MCH 29.5  26.0 - 34.0 pg    MCHC 31.9  30.0 - 36.0 g/dL    RDW 81.1 (*) 91.4 - 15.5 %     Platelets 147 (*) 150 - 400 K/uL    Neutrophils Relative 64  43 - 77 %    Neutro Abs 7.8 (*) 1.7 - 7.7 K/uL    Lymphocytes Relative 26  12 - 46 %    Lymphs Abs 3.1  0.7 - 4.0 K/uL    Monocytes Relative 8  3 - 12 %    Monocytes Absolute 1.0  0.1 - 1.0 K/uL    Eosinophils Relative 2  0 - 5 %    Eosinophils Absolute 0.2  0.0 - 0.7 K/uL    Basophils Relative 1  0 - 1 %    Basophils Absolute 0.1  0.0 - 0.1 K/uL   POCT I-STAT 3, BLOOD GAS (G3+)     Status: Abnormal   Collection Time   01/21/12  8:10 PM      Component Value Range Comment   pH, Arterial 7.053 (*) 7.350 - 7.450    pCO2 arterial 49.3 (*) 35.0 - 45.0 mmHg    pO2, Arterial 258.0 (*) 80.0 - 100.0 mmHg    Bicarbonate 13.8 (*) 20.0 - 24.0 mEq/L    TCO2 15  0 - 100 mmol/L    O2 Saturation 100.0      Acid-base deficit 16.0 (*) 0.0 - 2.0 mmol/L    Patient temperature 98.6 F      Collection site RADIAL, ALLEN'S TEST ACCEPTABLE      Drawn by RT      Sample type ARTERIAL      Comment NOTIFIED PHYSICIAN     POCT I-STAT TROPONIN I     Status: Normal   Collection Time   01/21/12  8:14 PM      Component Value Range Comment   Troponin i, poc 0.02  0.00 - 0.08 ng/mL    Comment 3            POCT I-STAT, CHEM 8     Status: Abnormal   Collection Time   01/21/12  8:16 PM      Component Value Range Comment   Sodium 137  135 - 145 mEq/L    Potassium 4.8  3.5 - 5.1 mEq/L    Chloride 104  96 - 112 mEq/L    BUN 74 (*) 6 - 23 mg/dL    Creatinine, Ser 7.82 (*) 0.50 - 1.10 mg/dL    Glucose,  Bld 264 (*) 70 - 99 mg/dL    Calcium, Ion 3.24 (*) 1.12 - 1.23 mmol/L    TCO2 18  0 - 100 mmol/L    Hemoglobin 13.9  12.0 - 15.0 g/dL    HCT 40.1  02.7 - 25.3 %   LACTIC ACID, PLASMA     Status: Abnormal   Collection Time   01/21/12 10:00 PM      Component Value Range Comment   Lactic Acid, Venous 9.1 (*) 0.5 - 2.2 mmol/L   CBC     Status: Abnormal   Collection Time   01/22/12 12:56 AM      Component Value Range Comment   WBC 25.6 (*) 4.0 - 10.5 K/uL     RBC 3.96  3.87 - 5.11 MIL/uL    Hemoglobin 11.8 (*) 12.0 - 15.0 g/dL    HCT 66.4  40.3 - 47.4 %    MCV 91.9  78.0 - 100.0 fL    MCH 29.8  26.0 - 34.0 pg    MCHC 32.4  30.0 - 36.0 g/dL    RDW 25.9 (*) 56.3 - 15.5 %    Platelets 171  150 - 400 K/uL   CREATININE, SERUM     Status: Abnormal   Collection Time   01/22/12 12:56 AM      Component Value Range Comment   Creatinine, Ser 5.53 (*) 0.50 - 1.10 mg/dL    GFR calc non Af Amer 8 (*) >90 mL/min    GFR calc Af Amer 10 (*) >90 mL/min   TROPONIN I     Status: Normal   Collection Time   01/22/12 12:56 AM      Component Value Range Comment   Troponin I <0.30  <0.30 ng/mL   GLUCOSE, CAPILLARY     Status: Abnormal   Collection Time   01/22/12  1:14 AM      Component Value Range Comment   Glucose-Capillary 251 (*) 70 - 99 mg/dL   MRSA PCR SCREENING     Status: Normal   Collection Time   01/22/12  3:08 AM      Component Value Range Comment   MRSA by PCR NEGATIVE  NEGATIVE   GLUCOSE, CAPILLARY     Status: Abnormal   Collection Time   01/22/12  3:42 AM      Component Value Range Comment   Glucose-Capillary 338 (*) 70 - 99 mg/dL   CBC     Status: Abnormal   Collection Time   01/22/12  4:50 AM      Component Value Range Comment   WBC 25.0 (*) 4.0 - 10.5 K/uL    RBC 3.95  3.87 - 5.11 MIL/uL    Hemoglobin 11.6 (*) 12.0 - 15.0 g/dL    HCT 87.5 (*) 64.3 - 46.0 %    MCV 90.6  78.0 - 100.0 fL    MCH 29.4  26.0 - 34.0 pg    MCHC 32.4  30.0 - 36.0 g/dL    RDW 32.9 (*) 51.8 - 15.5 %    Platelets 177  150 - 400 K/uL   BASIC METABOLIC PANEL     Status: Abnormal   Collection Time   01/22/12  4:50 AM      Component Value Range Comment   Sodium 134 (*) 135 - 145 mEq/L    Potassium 4.2  3.5 - 5.1 mEq/L    Chloride 95 (*) 96 - 112 mEq/L DELTA CHECK NOTED   CO2 13 (*)  19 - 32 mEq/L    Glucose, Bld 375 (*) 70 - 99 mg/dL    BUN 62 (*) 6 - 23 mg/dL    Creatinine, Ser 1.61 (*) 0.50 - 1.10 mg/dL    Calcium 6.9 (*) 8.4 - 10.5 mg/dL    GFR calc non  Af Amer 8 (*) >90 mL/min    GFR calc Af Amer 9 (*) >90 mL/min   MAGNESIUM     Status: Normal   Collection Time   01/22/12  4:50 AM      Component Value Range Comment   Magnesium 1.9  1.5 - 2.5 mg/dL   PHOSPHORUS     Status: Abnormal   Collection Time   01/22/12  4:50 AM      Component Value Range Comment   Phosphorus 10.3 (*) 2.3 - 4.6 mg/dL   LACTIC ACID, PLASMA     Status: Abnormal   Collection Time   01/22/12  4:50 AM      Component Value Range Comment   Lactic Acid, Venous 5.6 (*) 0.5 - 2.2 mmol/L   TROPONIN I     Status: Normal   Collection Time   01/22/12  4:50 AM      Component Value Range Comment   Troponin I <0.30  <0.30 ng/mL   POCT I-STAT 3, BLOOD GAS (G3+)     Status: Abnormal   Collection Time   01/22/12  4:50 AM      Component Value Range Comment   pH, Arterial 7.256 (*) 7.350 - 7.450    pCO2 arterial 32.6 (*) 35.0 - 45.0 mmHg    pO2, Arterial 159.0 (*) 80.0 - 100.0 mmHg    Bicarbonate 14.5 (*) 20.0 - 24.0 mEq/L    TCO2 16  0 - 100 mmol/L    O2 Saturation 99.0      Acid-base deficit 12.0 (*) 0.0 - 2.0 mmol/L    Patient temperature 36.7 C      Collection site ARTERIAL LINE      Drawn by Operator      Sample type ARTERIAL     GLUCOSE, CAPILLARY     Status: Abnormal   Collection Time   01/22/12  6:17 AM      Component Value Range Comment   Glucose-Capillary 311 (*) 70 - 99 mg/dL   PROCALCITONIN     Status: Normal   Collection Time   01/22/12  8:00 AM      Component Value Range Comment   Procalcitonin 7.94      Review of systems not obtained due to patient factors.  Physical Exam: Filed Vitals:   01/22/12 0900  BP: 105/50  Pulse: 84  Temp: 100.4 F (38 C)  Resp: 21     General appearance: Intubated, sedated, in no distress Head: Normocephalic, without obvious abnormality, atraumatic Neck: no adenopathy, no carotid bruit, no JVD and supple, symmetrical, trachea midline Resp: clear to auscultation bilaterally Cardio: regular rate and rhythm, S1, S2  normal, no murmur, click, rub or gallop GI: soft, non-tender; bowel sounds normal; no masses,  no organomegaly Extremities: No lower extremity edema, left BKA, left middle finger amp, right great toe amp Dialysis Access: AVF @ LUA with + bruit   Assessment/Plan: 1. Acute respiratory failure, secondary to cardiac arrest - intubated, on levophed; chest x-ray shows CM with resolved interstitial edema, CT head with no acute findings; initial EKG showed nodal rhythm (HR 48) with borderline ST depression and T wave inversion in V4-V6 and inferior leads; ?  Sepsis, blood cultures pending, on Vancomycin and Primaxin.  2. Acidosis - metabolic ?, ABGs improving. 3. ESRD -  HD on TTS @ [facility] [place], K 4.2.  Obtain HD information from center tomorrow, next HD likely on 10/1.  4. Hypertension/volume  - BP 109/44 most recently; chest x-ray negative, current wt 98.8 kg. 5. Anemia  - Hgb 11.6, outpatient meds uncertain.  No Aranesp now. 6. Metabolic bone disease -  Ca 6.9 (7.5 corrected), P 10.3.  Obtain outpatient meds tomorrow. 7. Nutrition - Alb 3.2. 8. DM - BG 311 this AM, on Insulin. 9. Hx fungal sepsis - BCs + for Candida parapsilosis in 02/2011, TEE negative.  LYLES,CHARLES 01/22/2012, 10:35 AM   Attending Nephrologist: Delano Metz, MD  Patient seen and examined and agree with assessment and plan as above. 51 yo female with HTN, DM and ESRD has cardiac arrest at home last night.  She had had HD earlier in the day. She was resuscitated and is now on the vent and sedated on pressors.  No need for dialysis right now, will try to wait until pressors are off or until Tuesday which is her next scheduled session. See rec's above also. Will follow.  Vinson Moselle  MD [employer] [phone] pgr    [phone] cell 01/22/2012, 7:53 PM

## 2012-01-22 NOTE — Progress Notes (Signed)
Hypoglycemic Event  CBG: 33  Treatment: D50 IV 25 mL  Symptoms: None  Follow-up CBG: Time:1545 CBG Result:60  Possible Reasons for Event: Medication regimen: lantus 15  Comments/MD notified:babcock    [name], [name]  Remember to initiate Hypoglycemia Order Set & complete

## 2012-01-22 NOTE — CV Procedure (Signed)
Central Venous Catheter Insertion Procedure Note [name] [id number] January 18, 1961  Procedure: Insertion of Central Venous Catheter Indications: Assessment of intravascular volume, Drug and/or fluid administration and Frequent blood sampling  Procedure Details Consent: Risks of procedure as well as the alternatives and risks of each were explained to the (patient/caregiver).  Consent for procedure obtained. Time Out: Verified patient identification, verified procedure, site/side was marked, verified correct patient position, special equipment/implants available, medications/allergies/relevent history reviewed, required imaging and test results available.  Performed Real time [place] used to ID and cannulate the vessel  Maximum sterile technique was used including antiseptics, cap, gloves, gown, hand hygiene, mask and sheet. Skin prep: Chlorhexidine; local anesthetic administered A antimicrobial bonded/coated triple lumen catheter was placed in the right internal jugular vein using the Seldinger technique.  Evaluation Blood flow good Complications: No apparent complications Patient did tolerate procedure well. Chest X-ray ordered to verify placement.  CXR: pending.  [name] 01/22/2012, 12:01 PM   Supervised by me CXR shows RIJ in good position -OK to use.  [name] V.

## 2012-01-22 NOTE — Progress Notes (Signed)
Name: [name] MRN: [id number] DOB: 17-Jan-1961    LOS: 1  PULMONARY / CRITICAL CARE MEDICINE  HPI:   51 years old female with PMH relevant for ESRD on HD three times per week, IDDM, HTN, PVD post left BKA. Had amputation of left middle finger about two weeks PTA. Had dialysis 9/28 and low blood pressure was documented during HD. She was feeding the animals and came into the house saying that she was not feeling well. Immediately after she collapsed and had agonal breathing. No CPR was initiated by family. EMS was called and arrived in about 10 minutes. Initial rhythm was PEA. CPR and ACLS was initiated with return to spontaneous circulation after 10 minutes. Hypothermia protocol was initiated on route but discontinued because the patient started responding. (details are unclear). A left femoral CVC was placed by ED physicians. At the time of initial CCM exam the patient was intubated, had some PRN sedation. BP is slightly elevated on levophed drip. Slightly opens her eyes on painful stimuli. Moves both arms but seems to prefer left side. TEE 2012 (for bacteremia) nml LV fn  Current Status: Intubated, critically ill, on pressors, sedated on drips  Vital Signs: Temp:  [94.6 F (34.8 C)-100.4 F (38 C)] 100.4 F (38 C) (09/29 0900) Pulse Rate:  [62-131] 84  (09/29 0900) Resp:  [8-26] 21  (09/29 0900) BP: (37-193)/(17-140) 105/50 mmHg (09/29 0900) SpO2:  [89 %-100 %] 100 % (09/29 0900) Arterial Line BP: (98-142)/(50-65) 122/63 mmHg (09/29 0900) FiO2 (%):  [40 %-60 %] 40 % (09/29 0805) Weight:  [98.8 kg (217 lb 13 oz)] 98.8 kg (217 lb 13 oz) (09/29 0458)  Physical Examination: General:  Intubated, mechanically ventilated, no acute distress Neuro:  Sedated, pupils dilated NRTL, corneals + (Initial exam prior to sedation - Moves both arms, seems to prefer left side. Anisocoria L>R) HEENT:  PERRL, pink conjunctivae, moist membranes Neck:  Supple, no JVD   Cardiovascular:  RRR, no  M/R/G Lungs:  Adequate air entry, scattered crackles bilaterally Abdomen:  Soft, nontender, nondistended, bowel sounds present Musculoskeletal:  Left BKA. Left hand covered -sutures of left middle digital amputn surgery. AV fistula on left arm. No pedal edema Skin:  No rash. Right foot sutures to great toe and first toe area,.    ASSESSMENT AND PLAN  PULMONARY  Lab 01/22/12 0450 01/21/12 2010  PHART 7.256* 7.053*  PCO2ART 32.6* 49.3*  PO2ART 159.0* 258.0*  HCO3 14.5* 13.8*  O2SAT 99.0 100.0   Ventilator Settings: Vent Mode:  [-] PRVC FiO2 (%):  [40 %-60 %] 40 % Set Rate:  [18 bmp] 18 bmp Vt Set:  [46 mL-460 mL] 46 mL PEEP:  [5 cmH20] 5 cmH20 Plateau Pressure:  [19 cmH20-26 cmH20] 19 cmH20 CXR:  improved interstitial edema. , ETT in position ETT: 9/28 >> Metabolic acidosis will prevent weaning at this point. Think she will need HD prior to weaning efforts.  A:  1) Post cardiac arrest, unclear etiology 2) Acute respiratory failure secondary to cardiac arrest 3) Bilateral lung infiltrates, likely edema, as improved w/ positive pressure. P:   Mechanical ventilation PRVC, Vt: 8cc/kg, PEEP: 5, RR: 22, FiO2 40% VAP prevention order set. Albuterol PRN  CARDIOVASCULAR  Lab 01/22/12 0450 01/22/12 0056 01/21/12 2200  TROPONINI <0.30 <0.30 --  LATICACIDVEN 5.6* -- 9.1*  PROBNP -- -- --   ECG:  Initial EKG at [facility] showed nodal rhythm with HR of 48, borderline ST depression and T wave inversion in V4-V6 and inferior  leads. Will repeat EKG.  Lines: Left femoral line placed by ED physicians. 01/21/12  A:  1) Post cardiac arrest - sepsis (low BP on HD) , doubt tamponade 2) Lactic acidosis (with clearance of LA) 3) Shock (cardiogenic vs septic)  4) Negative troponin At this point working dx is sepsis. Suspect PEA in setting of acidosis and hypoperfusion. CEs negative.  P:  - Continue levophed and titrate to MAP of 65 - Await echocardiogram & cards input - No hypothermia protocol  due to patient responsiveness and presumed sepsis etiology   RENAL  Lab 01/22/12 0450 01/22/12 0056 01/21/12 2016 01/21/12 2010  NA 134* -- 137 136  K 4.2 -- 4.8 --  CL 95* -- 104 94*  CO2 13* -- -- 15*  BUN 62* -- 74* 60*  CREATININE 5.59* 5.53* 5.00* 5.62*  CALCIUM 6.9* -- -- 7.9*  MG 1.9 -- -- --  PHOS 10.3* -- -- --   Intake/Output      09/28 0701 - 09/29 0700 09/29 0701 - 09/30 0700   I.V. (mL/kg) 1140.2 (11.5) 303.8 (3.1)   NG/GT 30    IV Piggyback 200    Total Intake(mL/kg) 1370.2 (13.9) 303.8 (3.1)   Urine (mL/kg/hr)  100   Emesis/NG output 800    Total Output 800 100   Net +570.2 +203.8          A:   1) ESRD on HD via AV fistula 2) Had dialysis sat 9/28 (Tuesday, Thursday and Saturday) 3) metabolic acidosis.  Likely will need CRRT.  P:   - Will consult nephrology for HD -no acute indication - bicarb gtt until we can get her HD  GASTROINTESTINAL No results found for this basename: AST:5,ALT:5,ALKPHOS:5,BILITOT:5,PROT:5,ALBUMIN:5 in the last 168 hours  A:   1) No issues P:   - NGT - NPO - Protonix  HEMATOLOGIC  Lab 01/22/12 0450 01/22/12 0056 01/21/12 2016 01/21/12 2010  HGB 11.6* 11.8* 13.9 12.3  HCT 35.8* 36.4 41.0 38.6  PLT 177 171 -- 147*  INR -- -- -- --  APTT -- -- -- --   A:   1)anemia No evidence of bleeding.  P:  - Monitor CBC  INFECTIOUS  Lab 01/22/12 0800 01/22/12 0450 01/22/12 0056 01/21/12 2010  WBC -- 25.0* 25.6* 12.1*  PROCALCITON 7.94 -- -- --   Cultures: Blood cultures ordered. Antibiotics: Imipenem and Vancomycin (penicillin allergy)  A:   1) Reported hypotension during dialysis. Now on levophed. Cannot rule out sepsis. 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 Primary concern would be Osteo of Right great toe and first toe amputation site and/or left  middle finger amputation site.  P:   -cont  Imipenem and Vancomycin - Will follow cultures. -2 view of RLE and Lhand  ENDOCRINE  Lab 01/22/12 0617 01/22/12 0342 01/22/12 0114  GLUCAP 311* 338* 251*   A:   1) IDDM  Remains hyperglycemic.  P:   - ICU hyperglycemia protocol - on insulin drip  NEUROLOGIC  A:   1) Post cardiac arrest encephalopathy (Had PRN sedation before initial exam - Opening her eyes with painful stimuli. Moving both arms but seems to prefer left side. Unequal pupils.) P:    - dc Versed , use Fentanyl prn - Daily awakening  BEST PRACTICE / DISPOSITION - Level of Care:  ICU - Primary  Service:  PCCM - Consultants:  Nephrology in am - Code Status:  Full code - Diet:  NPO - DVT Px:  Heparin - GI Px:  Protonix - Skin Integrity:  No decubitus ulcers  - Social / Family:  Family updated at bedside.   01/22/2012, 10:45 AM   Care during the described time interval was provided by me and/or other providers on the critical care team.  I have reviewed this patient's available data, including medical history, events of note, physical examination and test results as part of my evaluation  CC time x  45 m  Cyril Mourning MD. Tonny Bollman. [facility] Pager [phone] If no response call [phone]

## 2012-01-22 NOTE — Progress Notes (Signed)
Name: [name] MRN: [id number] DOB: [birth year]    LOS: 1  PULMONARY / CRITICAL CARE MEDICINE  HPI:   51 years old female with PMH relevant for ESRD on HD three times per week, IDDM, HTN, PVD post left BKA. Had carpal tunnel release surgery about two weeks PTA. Had dialysis 9/28 and low blood pressure was documented during HD. She was feeding the animals and came into the house saying that she was not feeling well. Immediately after she collapsed and had agonal breathing. No CPR was initiated by family. EMS was called and arrived in about 10 minutes. Initial rhythm was PEA. CPR and ACLS was initiated with return to spontaneous circulation after 10 minutes. Hypothermia protocol was initiated on route but discontinued because the patient started responding. (details are unclear). A left femoral CVC was placed by ED physicians. At the time of initial CCM exam the patient was intubated, had some PRN sedation. BP is slightly elevated on levophed drip. Slightly opens her eyes on painful stimuli. Moves both arms but seems to prefer left side. TEE 2012 (for bacteremia) nml LV fn  Current Status: Intubated, critically ill, on pressors, sedated on drips  Vital Signs: Temp:  [94.6 F (34.8 C)-99.7 F (37.6 C)] 99.7 F (37.6 C) (09/29 0700) Pulse Rate:  [62-131] 92  (09/29 0700) Resp:  [8-26] 14  (09/29 0700) BP: (37-193)/(17-140) 114/37 mmHg (09/29 0700) SpO2:  [89 %-100 %] 100 % (09/29 0700) Arterial Line BP: (98-142)/(50-65) 119/52 mmHg (09/29 0700) FiO2 (%):  [40 %-60 %] 40 % (09/29 0414) Weight:  [98.8 kg (217 lb 13 oz)] 98.8 kg (217 lb 13 oz) (09/29 0458)  Physical Examination: General:  Intubated, mechanically ventilated, no acute distress Neuro:  Sedated, pupils dilated NRTL, corneals + (Initial exam prior to sedation - Moves both arms, seems to prefer left side. Anisocoria L>R) HEENT:  PERRL, pink conjunctivae, moist membranes Neck:  Supple, no JVD   Cardiovascular:  RRR, no  M/R/G Lungs:  Adequate air entry, scattered crackles bilaterally Abdomen:  Soft, nontender, nondistended, bowel sounds present Musculoskeletal:  Left BKA. Left hand covered -sutures of left middle digital amputn surgery. AV fistula on left arm. No pedal edema Skin:  No rash    ASSESSMENT AND PLAN  PULMONARY  Lab 01/22/12 0450 01/21/12 2010  PHART 7.256* 7.053*  PCO2ART 32.6* 49.3*  PO2ART 159.0* 258.0*  HCO3 14.5* 13.8*  O2SAT 99.0 100.0   Ventilator Settings: Vent Mode:  [-] PRVC FiO2 (%):  [40 %-60 %] 40 % Set Rate:  [18 bmp] 18 bmp Vt Set:  [460 mL] 460 mL PEEP:  [5 cmH20] 5 cmH20 Plateau Pressure:  [19 cmH20-26 cmH20] 19 cmH20 CXR:  improved interstitial edema. , ETT in position ETT: 9/28 >>  A:  1) Post cardiac arrest, unclear etiology 2) Hypothermia protocol suspended due to patient improved mental status during transfer. 3) Acute respiratory failure secondary to cardiac arrest 4) Bilateral lung infiltrates, likely edema. P:   - Admit to ICU - Mechanical ventilation   - PRVC, Vt: 8cc/kg, PEEP: 5, RR: 22, FiO2 40% - VAP prevention order set. - Albuterol PRN  CARDIOVASCULAR  Lab 01/22/12 0450 01/22/12 0056 01/21/12 2200  TROPONINI <0.30 <0.30 --  LATICACIDVEN 5.6* -- 9.1*  PROBNP -- -- --   ECG:  Initial EKG at [facility] showed nodal rhythm with HR of 48, borderline ST depression and T wave inversion in V4-V6 and inferior leads. Will repeat EKG.  Lines: Left femoral line placed by ED  physicians. 01/21/12  A:  1) Post cardiac arrest - sepsis (low BP on HD) , doubt tamponade 2) Lactic acidosis 3) Shock (cardiogenic vs septic)  4) Negative troponin P:  - Continue levophed and titrate to MAP of 65, titrate down epi to off - Await echocardiogram & cards input - Follow troponin x 3 - No hypothermia protocol due to patient responsiveness.  RENAL  Lab 01/22/12 0450 01/22/12 0056 01/21/12 2016 01/21/12 2010  NA 134* -- 137 136  K 4.2 -- 4.8 --  CL 95* -- 104 94*   CO2 13* -- -- 15*  BUN 62* -- 74* 60*  CREATININE 5.59* 5.53* 5.00* 5.62*  CALCIUM 6.9* -- -- 7.9*  MG 1.9 -- -- --  PHOS 10.3* -- -- --   Intake/Output      09/28 0701 - 09/29 0700 09/29 0701 - 09/30 0700   NG/GT 30    IV Piggyback 200    Total Intake(mL/kg) 230 (2.3)    Emesis/NG output 800    Total Output 800    Net -570            A:   1) ESRD on HD via AV fistula 2) Had dialysis today (Tuesday, Thursday and Saturday) P:   - Will consult nephrology for HD -no acute indication  GASTROINTESTINAL No results found for this basename: AST:5,ALT:5,ALKPHOS:5,BILITOT:5,PROT:5,ALBUMIN:5 in the last 168 hours  A:   1) No issues P:   - NGT - NPO - Protonix  HEMATOLOGIC  Lab 01/22/12 0450 01/22/12 0056 01/21/12 2016 01/21/12 2010  HGB 11.6* 11.8* 13.9 12.3  HCT 35.8* 36.4 41.0 38.6  PLT 177 171 -- 147*  INR -- -- -- --  APTT -- -- -- --   A:   1) No issues P:  - Monitor CBC  INFECTIOUS  Lab 01/22/12 0450 01/22/12 0056 01/21/12 2010  WBC 25.0* 25.6* 12.1*  PROCALCITON -- -- --   Cultures: Blood cultures ordered. Antibiotics: Imipenem and Vancomycin (penicillin allergy)  A:   1) Reported hypotension during dialysis. Now on levophed. Cannot rule out sepsis. - cover wound source P:   - Will start Imipenem and Vancomycin - Will follow cultures.  ENDOCRINE  Lab 01/22/12 0617 01/22/12 0342 01/22/12 0114  GLUCAP 311* 338* 251*   A:   1) IDDM  P:   - ICU hyperglycemia protocol - on insulin drip  NEUROLOGIC  A:   1) Post cardiac arrest encephalopathy (Had PRN sedation before initial exam - Opening her eyes with painful stimuli. Moving both arms but seems to prefer left side. Unequal pupils.) P:    - dc Versed , use Fentanyl prn - Daily awakening  BEST PRACTICE / DISPOSITION - Level of Care:  ICU - Primary Service:  PCCM - Consultants:  Nephrology in am - Code Status:  Full code - Diet:  NPO - DVT Px:  Heparin - GI Px:  Protonix - Skin  Integrity:  No decubitus ulcers  - Social / Family:  Family updated at bedside.  The patient is critically ill with multiple organ systems failure and requires high complexity decision making for assessment and support, frequent evaluation and titration of therapies, application of advanced monitoring technologies and extensive interpretation of multiple databases.   Critical Care Time devoted to patient care services described in this note is: 45 m    01/22/2012, 7:28 AM

## 2012-01-23 ENCOUNTER — Inpatient Hospital Stay (HOSPITAL_COMMUNITY): Payer: Medicare Other

## 2012-01-23 DIAGNOSIS — I4589 Other specified conduction disorders: Secondary | ICD-10-CM

## 2012-01-23 DIAGNOSIS — J9601 Acute respiratory failure with hypoxia: Secondary | ICD-10-CM | POA: Diagnosis present

## 2012-01-23 DIAGNOSIS — N186 End stage renal disease: Secondary | ICD-10-CM

## 2012-01-23 LAB — BASIC METABOLIC PANEL
BUN: 72 mg/dL — ABNORMAL HIGH (ref 6–23)
CO2: 18 mEq/L — ABNORMAL LOW (ref 19–32)
Chloride: 92 mEq/L — ABNORMAL LOW (ref 96–112)
Creatinine, Ser: 6.41 mg/dL — ABNORMAL HIGH (ref 0.50–1.10)
Glucose, Bld: 123 mg/dL — ABNORMAL HIGH (ref 70–99)

## 2012-01-23 LAB — GLUCOSE, CAPILLARY
Glucose-Capillary: 105 mg/dL — ABNORMAL HIGH (ref 70–99)
Glucose-Capillary: 117 mg/dL — ABNORMAL HIGH (ref 70–99)
Glucose-Capillary: 83 mg/dL (ref 70–99)
Glucose-Capillary: 99 mg/dL (ref 70–99)

## 2012-01-23 LAB — POCT I-STAT 3, ART BLOOD GAS (G3+)
Patient temperature: 98.4
pCO2 arterial: 33.4 mmHg — ABNORMAL LOW (ref 35.0–45.0)
pH, Arterial: 7.369 (ref 7.350–7.450)

## 2012-01-23 LAB — CBC
HCT: 35.4 % — ABNORMAL LOW (ref 36.0–46.0)
MCH: 29 pg (ref 26.0–34.0)
MCHC: 32.8 g/dL (ref 30.0–36.0)
MCV: 88.5 fL (ref 78.0–100.0)
RDW: 16.5 % — ABNORMAL HIGH (ref 11.5–15.5)

## 2012-01-23 LAB — PROCALCITONIN: Procalcitonin: 21.05 ng/mL

## 2012-01-23 MED ORDER — DARBEPOETIN ALFA-POLYSORBATE 60 MCG/0.3ML IJ SOLN
60.0000 ug | INTRAMUSCULAR | Status: DC
  Administered 2012-01-24: 60 ug via INTRAVENOUS
  Filled 2012-01-23: qty 0.3

## 2012-01-23 MED ORDER — SODIUM CHLORIDE 0.9 % IV SOLN
25.0000 ug/h | INTRAVENOUS | Status: DC
  Filled 2012-01-23: qty 50

## 2012-01-23 MED ORDER — HEPARIN SODIUM (PORCINE) 1000 UNIT/ML DIALYSIS
20.0000 [IU]/kg | INTRAMUSCULAR | Status: DC | PRN
  Filled 2012-01-23: qty 2

## 2012-01-23 MED ORDER — NEPRO/CARBSTEADY PO LIQD
1000.0000 mL | ORAL | Status: DC
  Administered 2012-01-23: 1000 mL via ORAL
  Filled 2012-01-23 (×3): qty 1000

## 2012-01-23 MED ORDER — FENTANYL CITRATE 0.05 MG/ML IJ SOLN: 25.0000 ug | INTRAMUSCULAR | Status: DC | PRN

## 2012-01-23 MED ORDER — INSULIN ASPART 100 UNIT/ML ~~LOC~~ SOLN
0.0000 [IU] | SUBCUTANEOUS | Status: DC
  Administered 2012-01-23: 1 [IU] via SUBCUTANEOUS

## 2012-01-23 NOTE — Progress Notes (Signed)
Patient able to follow commands. Remained calm. BP increasing and patient nodded yes to pain - sedation medication resumed at 100%.

## 2012-01-23 NOTE — Progress Notes (Signed)
[facility]  Subjective:  Intubated, sedated, squeezed my finger with R hand. NE 8 mcg/min Primaxin + vancomycin    Objective: Vital signs in last 24 hours: Blood pressure 147/72, pulse 83, temperature 99.7 F (37.6 C), temperature source Axillary, resp. rate 18, height 5\' 3"  (1.6 m), weight 97.1 kg (214 lb 1.1 oz), SpO2 100.00%.    PHYSICAL EXAM General--as noted above  Chest--rhonchi Heart--no rub Abd--RUQ scar + BTL scar, nontender Extr--AVF patent L upper arm (very faint bruit), scratches R LE (? Cat scratch), R 1st and 2nd toes amputated,  L BKA  Lab Results:   Lab 01/23/12 0354 01/22/12 0450 01/22/12 0056 01/21/12 2016 01/21/12 2010  NA 130* 134* -- 137 --  K 4.8 4.2 -- 4.8 --  CL 92* 95* -- 104 --  CO2 18* 13* -- -- 15*  BUN 72* 62* -- 74* --  CREATININE 6.41* 5.59* 5.53* -- --  ALB -- -- -- -- --  GLUCOSE 123* -- -- -- --  CALCIUM 6.8* 6.9* -- -- 7.9*  PHOS -- 10.3* -- -- --     Basename 01/23/12 0354 01/22/12 0450  WBC 14.1* 25.0*  HGB 11.6* 11.6*  HCT 35.4* 35.8*  PLT 131* 177    DIALYSIS  ORDERS (outpatient)  4 1/4 hr, 2K, 2.25 Ca, EDW 88 kg, Heparin 8000 U, EPO 7800 TIW, zemplar 1 mcg TIW  Fe studies and PTH were sent from [facility] on 28 Sep (pending)  Prior to Admission:   Scheduled: Continuous:  Assessment/Plan:  1. Acute respiratory failure, secondary to cardiac arrest - intubated, on levophed; chest x-ray shows CM with resolved interstitial edema, CT head with no acute findings; initial EKG showed nodal rhythm (HR 48) with borderline ST depression and T wave inversion in V4-V6 and inferior leads; ? Sepsis, blood cultures pending, on Vancomycin and Primaxin. Husband says scratches on LE are from cat 2. Acidosis -ABG today pH 7.37, pO2 139, pCO2 33 on 40% FIO2. D/c IV bicarb 3. ESRD - HD on TTS @ [facility], K 4.2.  HD likely on 10/1.  4. Hypertension/volume - BP better--pressors weaning, chest x-ray negative, current wt 97.1  kg. 5. Anemia - Hgb 11.6--restart EPO, check Fe studies when back from outpt ctr. 6. Metabolic bone disease - Ca 6.9 (7.5 corrected), P 10.3. Was purportedly on Renvela powder 1600 TID, phosL3 TID, Fosrenol 1 g TID 7. Nutrition - Alb 3.2. 8. DM - BG 123 this AM, on SSI 9. Hx fungal sepsis - BCs + for Candida parapsilosis in 02/2011, TEE negative.  LOS: 2 days   Lama Narayanan F 01/23/2012,9:12 AM   .labalb

## 2012-01-23 NOTE — Progress Notes (Signed)
Name: [name] MRN: [id number] DOB: June 03, 1960    LOS: 2  PULMONARY / CRITICAL CARE MEDICINE  HPI:   51 years old female with PMH relevant for ESRD on HD three times per week, IDDM, HTN, PVD post left BKA. Had amputation of left middle finger about two weeks PTA. Had dialysis 9/28 and low blood pressure was documented during HD. She was feeding the animals and came into the house saying that she was not feeling well. Immediately after she collapsed and had agonal breathing. No CPR was initiated by family. EMS was called and arrived in about 10 minutes. Initial rhythm was PEA. CPR and ACLS was initiated with return to spontaneous circulation after 10 minutes. Hypothermia protocol was initiated on route but discontinued because the patient started responding. (details are unclear). A left femoral CVC was placed by ED physicians. At the time of initial CCM exam the patient was intubated, had some PRN sedation. BP is slightly elevated on levophed drip. Slightly opens her eyes on painful stimuli. Moves both arms but seems to prefer left side.   TEE 2012 (for bacteremia) nml LV fn  Current Status: remains intubated. + F/C. Tolerates PSV 5-10 cm H2O  Vital Signs: Temp:  [97.8 F (36.6 C)-99.7 F (37.6 C)] 99 F (37.2 C) (09/30 1548) Pulse Rate:  [80-91] 91  (09/30 1800) Resp:  [9-20] 12  (09/30 1800) BP: (114-148)/(62-81) 126/64 mmHg (09/30 1800) SpO2:  [100 %] 100 % (09/30 1800) Arterial Line BP: (88-151)/(48-78) 125/64 mmHg (09/30 1800) FiO2 (%):  [40 %] 40 % (09/30 1512) Weight:  [97.1 kg (214 lb 1.1 oz)] 97.1 kg (214 lb 1.1 oz) (09/30 0500)  Physical Examination: General:  Intubated, mechanically ventilated, no acute distress Neuro:  MAEs, + F/C, RASS -1 HEENT:  PERRL, pink conjunctivae, moist membranes Neck:  Supple, no JVD   Cardiovascular:  RRR, no M/R/G Lungs:  Dependent rales, no wheezes Abdomen:  Soft, nontender, nondistended, bowel sounds present Musculoskeletal:  Left  BKA. Left hand dressed -sutures of left middle digital amputn surgery. AV fistula on left arm. No pedal edema Skin:  Diffuse scratches on RLE (by cat per pt's husband)   ASSESSMENT AND PLAN  PULMONARY  Lab 01/23/12 0359 01/22/12 1044 01/22/12 0450 01/21/12 2010  PHART 7.369 7.309* 7.256* 7.053*  PCO2ART 33.4* 32.0* 32.6* 49.3*  PO2ART 139.0* 178.0* 159.0* 258.0*  HCO3 19.3* 15.9* 14.5* 13.8*  O2SAT 99.0 99.0 99.0 100.0   Ventilator Settings: Vent Mode:  [-] PSV FiO2 (%):  [40 %] 40 % Set Rate:  [18 bmp] 18 bmp Vt Set:  [460 mL] 460 mL PEEP:  [5 cmH20] 5 cmH20 Pressure Support:  [14 cmH20] 14 cmH20 Plateau Pressure:  [21 cmH20-22 cmH20] 22 cmH20 CXR:  Edema pattern with R>L effusions ETT: 9/28 >>  A:  1) Post cardiac arrest, unclear etiology 2) Acute respiratory failure secondary to cardiac arrest 3) Bilateral lung infiltrates, likely edema, as improved w/ positive pressure. P:   Discussed with Dr Caryn Section - HD today Work in PSV mode today Assess for extubation 10/01  CARDIOVASCULAR  Lab 01/23/12 0401 01/22/12 1158 01/22/12 0450 01/22/12 0056 01/21/12 2200  TROPONINI -- <0.30 <0.30 <0.30 --  LATICACIDVEN 1.2 -- 5.6* -- 9.1*  PROBNP -- -- -- -- --   ECG:  Initial EKG at [facility] showed nodal rhythm with HR of 48, borderline ST depression and T wave inversion in V4-V6 and inferior leads. Will repeat EKG.  Lines: Left femoral line placed by ED physicians.  01/21/12  A:  1) Post cardiac arrest - sepsis (low BP on HD) 2) Lactic acidosis - resolved 3) Shock (cardiogenic vs septic) - resolved. Off pressors 9/30 4) Negative troponin At this point working dx is sepsis. Suspect PEA in setting of acidosis and hypoperfusion. CEs negative.  P:  - Cont to monitor   RENAL  Lab 01/23/12 0354 01/22/12 0450 01/22/12 0056 01/21/12 2016 01/21/12 2010  NA 130* 134* -- 137 136  K 4.8 4.2 -- -- --  CL 92* 95* -- 104 94*  CO2 18* 13* -- -- 15*  BUN 72* 62* -- 74* 60*  CREATININE 6.41* 5.59*  5.53* 5.00* 5.62*  CALCIUM 6.8* 6.9* -- -- 7.9*  MG -- 1.9 -- -- --  PHOS -- 10.3* -- -- --   Intake/Output      09/29 0701 - 09/30 0700 09/30 0701 - 10/01 0700   I.V. (mL/kg) 2333.8 (24) 315.6 (3.3)   NG/GT 180 60   IV Piggyback 210    Total Intake(mL/kg) 2723.8 (28.1) 375.6 (3.9)   Urine (mL/kg/hr) 100 (0)    Emesis/NG output 300 150   Total Output 400 150   Net +2323.8 +225.6          A:   1) ESRD on HD via AV fistula 2) Had dialysis sat 9/28 (Tuesday, Thursday and Saturday) 3) metabolic acidosis, improved  P:   - HD today - discussed with Dr Caryn Section  GASTROINTESTINAL No results found for this basename: AST:5,ALT:5,ALKPHOS:5,BILITOT:5,PROT:5,ALBUMIN:5 in the last 168 hours  A:   1) No issues P:   - NGT -begin TFs. D/C D10 - Protonix  HEMATOLOGIC  Lab 01/23/12 0354 01/22/12 0450 01/22/12 0056 01/21/12 2016 01/21/12 2010  HGB 11.6* 11.6* 11.8* 13.9 12.3  HCT 35.4* 35.8* 36.4 41.0 38.6  PLT 131* 177 171 -- 147*  INR -- -- -- -- --  APTT -- -- -- -- --   A:   1)anemia, mild thrombocytopenia No evidence of bleeding.  P:  - Monitor CBC  INFECTIOUS  Lab 01/23/12 0354 01/22/12 0800 01/22/12 0450 01/22/12 0056 01/21/12 2010  WBC 14.1* -- 25.0* 25.6* 12.1*  PROCALCITON 21.05 7.94 -- -- --   Cultures: Blood cultures ordered. Antibiotics: Imipenem and Vancomycin (penicillin allergy)  A:   1) Reported hypotension during dialysis. Cannot rule out sepsis. 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 Primary concern would be Osteo of Right great toe and first toe amputation site and/or left middle finger amputation site.  P:   -cont  Imipenem and Vancomycin - Will follow cultures. -2 view of RLE and Lhand   ENDOCRINE  Lab 01/23/12 1547 01/23/12 1216 01/23/12 0802 01/23/12 0354 01/23/12 0020  GLUCAP 115* 121* 105* 117* 99   A:     1) IDDM  Remains hyperglycemic.  P:   - ICU hyperglycemia protocol - on insulin drip  NEUROLOGIC  A:   1) Post cardiac arrest encephalopathy Much improved P:   Minimize sedation Anticipate extubation 10/01  BEST PRACTICE / DISPOSITION - Level of Care:  ICU - Primary Service:  PCCM - Consultants:  Nephrology in am - Code Status:  Full code - Diet:  NPO - DVT Px:  Heparin - GI Px:  Protonix - Skin Integrity:  No decubitus ulcers  - Social / Family:  Family updated at bedside.   01/23/2012, 6:12 PM  CC time x  35 m Husband updated @ bedside   Billy Fischer, MD ; [facility] [phone].  After 5:30 PM or weekends, call [phone]

## 2012-01-23 NOTE — Progress Notes (Signed)
Patient ID: [name], female   DOB: Aug 13, 1960, 51 y.o.   MRN: [id number]   PCT high, troponin negative.  Suspect septic shock.  Echo showed normal LV systolic function but hypokinetic RV with mild pulmonary HTN.  We will follow at a distance for now.  If cultures positive and concern for endocarditis, please let us know for TEE.   [name] 01/23/2012 8:24 AM

## 2012-01-23 NOTE — Progress Notes (Signed)
INITIAL ADULT NUTRITION ASSESSMENT Date: 01/23/2012   Time: 9:38 AM  INTERVENTION:  Recommend Nepro formula at 20 ml/hr with Prostat liquid protein 30 ml 4 times daily via tube to provide 1264 (68% of estimated kcal needs), 99 gm protein (99% of estimated protein needs), 349 ml of free water  Liquid MVI via tube daily RD to follow for nutrition care plan  Reason for Assessment: VDRF  ASSESSMENT: Female 51 y.o.  Dx: PEA arrest  Hx:  Past Medical History  Diagnosis Date  . Hypertension   . Anemia   . Diabetes mellitus   . Peripheral vascular disease   . Hemodialysis patient 03/15/11    "Tues; Thurs; Sat; [facility]"  . Blood transfusion   . Chronic back pain   . Rash 03/15/11    "admitted me to find out what its' from; UE, waist, thighs, groin"  . Renal failure     hd 4/12    Related Meds:     . antiseptic oral rinse  15 mL Mouth Rinse QID  . chlorhexidine  15 mL Mouth Rinse BID  . darbepoetin (ARANESP) injection - DIALYSIS  60 mcg Intravenous Q Tue-HD  . heparin  5,000 Units Subcutaneous Q8H  . imipenem-cilastatin  250 mg Intravenous Q12H  . insulin aspart  2-6 Units Subcutaneous Q4H  . insulin glargine  15 Units Subcutaneous Q24H  . pantoprazole (PROTONIX) IV  40 mg Intravenous Q24H  . vancomycin  1,000 mg Intravenous Q T,Th,Sa-HD    Ht: 5\' 3"  (160 cm)  Wt: 214 lb 1.1 oz (97.1 kg)  Ideal Wt: 52.2 kg % Ideal Wt: 186%  Usual Wt: 87 kg -- per flowsheet records % Usual Wt: 111%  Body mass index is 37.92 kg/(m^2).  Food/Nutrition Related Hx: admission nutrition screen incomplete  Labs:  CMP     Component Value Date/Time   NA 130* 01/23/2012 0354   K 4.8 01/23/2012 0354   CL 92* 01/23/2012 0354   CO2 18* 01/23/2012 0354   GLUCOSE 123* 01/23/2012 0354   BUN 72* 01/23/2012 0354   CREATININE 6.41* 01/23/2012 0354   CALCIUM 6.8* 01/23/2012 0354   CALCIUM 8.0* 03/02/2009 1350   PROT 9.8* 03/15/2011 2153   ALBUMIN 3.2* 03/21/2011 0636   AST  18 03/15/2011 2153   ALT 11 03/15/2011 2153   ALKPHOS 299* 03/15/2011 2153   BILITOT 1.3* 03/15/2011 2153   GFRNONAA 7* 01/23/2012 0354   GFRAA 8* 01/23/2012 0354    Phosphorus  Date Value Range Status  01/22/2012 10.3* 2.3 - 4.6 mg/dL Final    Magnesium  Date Value Range Status  01/22/2012 1.9  1.5 - 2.5 mg/dL Final     Intake/Output Summary (Last 24 hours) at 01/23/12 1045 Last data filed at 01/23/12 0800  Gross per 24 hour  Intake 2526.24 ml  Output    450 ml  Net 2076.24 ml    CBG (last 3)   Basename 01/23/12 0802 01/23/12 0354 01/23/12 0020  GLUCAP 105* 117* 99    Diet Order: NPO  Supplements/Tube Feeding: N/A  IVF:    dextrose Last Rate: 0.675 mg/kg/min (01/22/12 1706)  fentaNYL infusion INTRAVENOUS Last Rate: 30 mcg/hr (01/21/12 2359)  fentaNYL infusion INTRAVENOUS Last Rate: 50 mcg/hr (01/23/12 0800)  insulin (NOVOLIN-R) infusion Last Rate: Stopped (01/22/12 1228)  norepinephrine (LEVOPHED) Adult infusion Last Rate: 8 mcg/min (01/23/12 0800)  DISCONTD: epinephrine Last Rate: Stopped (01/22/12 0900)  DISCONTD: norepinephrine (LEVOPHED) Adult infusion Last Rate: 10 mcg/min (01/22/12 0800)  DISCONTD:  sodium bicarbonate infusion 1000 mL Last Rate: 50 mL/hr at 01/22/12 1228    Estimated Nutritional Needs:   Kcal: 1840 Protein: 100-110 gm Fluid: per MD  Patient is currently intubated on ventilator support MV: 8.7 Temp: 37.6  Patient admitted after collapsing at home; no CPR was initiated by family -- CPR and ACLS was initiated by EMS with return to spontaneous circulation after 10 minutes; patient with ESRD on HD; per Renal Service, likely will receive 10/1; OGT in place; + Fentanyl and Norepinephrine drips; Nepro formula ordered to initiate at 20 ml/hr  NUTRITION DIAGNOSIS: -Inadequate oral intake (NI-2.1).  Status: Ongoing  RELATED TO: inability to eat  AS EVIDENCE BY: NPO status  MONITORING/EVALUATION(Goals): Goal: EN to provide 60-70% of  estimated calorie needs (22-25 kcals/kg ideal body weight) and >/= 90% of estimated protein needs, based on ASPEN guidelines for permissive underfeeding in critically ill obese individuals Monitor: EN regimen & tolerance, respiratory status, weight, labs, I/O's  EDUCATION NEEDS: -No education needs identified at this time  Dietitian #: [phone]  DOCUMENTATION CODES Per approved criteria  -Obesity Unspecified    Alger Memos 01/23/2012, 9:38 AM

## 2012-01-24 ENCOUNTER — Inpatient Hospital Stay (HOSPITAL_COMMUNITY): Payer: Medicare Other

## 2012-01-24 DIAGNOSIS — D696 Thrombocytopenia, unspecified: Secondary | ICD-10-CM | POA: Diagnosis not present

## 2012-01-24 DIAGNOSIS — M869 Osteomyelitis, unspecified: Secondary | ICD-10-CM

## 2012-01-24 DIAGNOSIS — D649 Anemia, unspecified: Secondary | ICD-10-CM

## 2012-01-24 DIAGNOSIS — A419 Sepsis, unspecified organism: Secondary | ICD-10-CM | POA: Diagnosis present

## 2012-01-24 LAB — CBC
HCT: 30.3 % — ABNORMAL LOW (ref 36.0–46.0)
Hemoglobin: 9.9 g/dL — ABNORMAL LOW (ref 12.0–15.0)
MCH: 29.2 pg (ref 26.0–34.0)
MCH: 29 pg (ref 26.0–34.0)
MCHC: 33.6 g/dL (ref 30.0–36.0)
MCHC: 32.7 g/dL (ref 30.0–36.0)
MCV: 87.1 fL (ref 78.0–100.0)
Platelets: 108 10*3/uL — ABNORMAL LOW (ref 150–400)
RBC: 3.49 MIL/uL — ABNORMAL LOW (ref 3.87–5.11)
RBC: 3.41 MIL/uL — ABNORMAL LOW (ref 3.87–5.11)

## 2012-01-24 LAB — POCT I-STAT 3, ART BLOOD GAS (G3+)
Acid-Base Excess: 3 mmol/L — ABNORMAL HIGH (ref 0.0–2.0)
O2 Saturation: 90 %
TCO2: 28 mmol/L (ref 0–100)
pCO2 arterial: 36 mmHg (ref 35.0–45.0)

## 2012-01-24 LAB — GLUCOSE, CAPILLARY
Glucose-Capillary: 103 mg/dL — ABNORMAL HIGH (ref 70–99)
Glucose-Capillary: 104 mg/dL — ABNORMAL HIGH (ref 70–99)
Glucose-Capillary: 105 mg/dL — ABNORMAL HIGH (ref 70–99)
Glucose-Capillary: 125 mg/dL — ABNORMAL HIGH (ref 70–99)
Glucose-Capillary: 62 mg/dL — ABNORMAL LOW (ref 70–99)
Glucose-Capillary: 87 mg/dL (ref 70–99)

## 2012-01-24 LAB — RENAL FUNCTION PANEL
BUN: 45 mg/dL — ABNORMAL HIGH (ref 6–23)
CO2: 23 mEq/L (ref 19–32)
CO2: 24 mEq/L (ref 19–32)
Calcium: 7.2 mg/dL — ABNORMAL LOW (ref 8.4–10.5)
Creatinine, Ser: 4.28 mg/dL — ABNORMAL HIGH (ref 0.50–1.10)
GFR calc Af Amer: 11 mL/min — ABNORMAL LOW (ref >90)
GFR calc non Af Amer: 11 mL/min — ABNORMAL LOW (ref >90)
Glucose, Bld: 112 mg/dL — ABNORMAL HIGH (ref 70–99)
Glucose, Bld: 91 mg/dL (ref 70–99)
Phosphorus: 6.4 mg/dL — ABNORMAL HIGH (ref 2.3–4.6)
Phosphorus: 7.1 mg/dL — ABNORMAL HIGH (ref 2.3–4.6)
Potassium: 4.1 mEq/L (ref 3.5–5.1)
Sodium: 134 mEq/L — ABNORMAL LOW (ref 135–145)
Sodium: 135 mEq/L (ref 135–145)

## 2012-01-24 LAB — HEPATITIS B SURFACE ANTIGEN: Hepatitis B Surface Ag: NEGATIVE

## 2012-01-24 MED ORDER — HEPARIN SODIUM (PORCINE) 1000 UNIT/ML DIALYSIS
20.0000 [IU]/kg | INTRAMUSCULAR | Status: DC | PRN
  Administered 2012-01-24: 1900 [IU] via INTRAVENOUS_CENTRAL
  Filled 2012-01-24: qty 2

## 2012-01-24 MED ORDER — VANCOMYCIN HCL IN DEXTROSE 1-5 GM/200ML-% IV SOLN
1000.0000 mg | Freq: Once | INTRAVENOUS | Status: DC
  Filled 2012-01-24: qty 200

## 2012-01-24 MED ORDER — DARBEPOETIN ALFA-POLYSORBATE 60 MCG/0.3ML IJ SOLN
INTRAMUSCULAR | Status: AC
  Filled 2012-01-24: qty 0.3

## 2012-01-24 MED ORDER — SODIUM CHLORIDE 0.9 % IV SOLN
125.0000 mg | Freq: Once | INTRAVENOUS | Status: AC
  Administered 2012-01-24: 125 mg via INTRAVENOUS
  Filled 2012-01-24: qty 10

## 2012-01-24 MED ORDER — DEXTROSE 50 % IV SOLN
INTRAVENOUS | Status: AC
  Administered 2012-01-24: 25 mL via INTRAVENOUS
  Filled 2012-01-24: qty 50

## 2012-01-24 MED ORDER — FENTANYL CITRATE 0.05 MG/ML IJ SOLN
12.5000 ug | INTRAMUSCULAR | Status: DC | PRN
  Administered 2012-01-25: 25 ug via INTRAVENOUS
  Filled 2012-01-24: qty 2

## 2012-01-24 MED ORDER — DEXTROSE-NACL 5-0.9 % IV SOLN
INTRAVENOUS | Status: DC
  Administered 2012-01-24: 11:00:00 via INTRAVENOUS

## 2012-01-24 MED ORDER — SEVELAMER CARBONATE 800 MG PO TABS
1600.0000 mg | ORAL_TABLET | Freq: Three times a day (TID) | ORAL | Status: DC
  Filled 2012-01-24 (×12): qty 2

## 2012-01-24 MED ORDER — DEXTROSE 50 % IV SOLN
25.0000 mL | Freq: Once | INTRAVENOUS | Status: AC | PRN
  Administered 2012-01-24: 25 mL via INTRAVENOUS

## 2012-01-24 NOTE — Progress Notes (Signed)
50 ml's fentanyl wasted from when continuous sedation was d/c'd with Howell Pringle at [phone].  Medication was wasted in sink and flushed with water.

## 2012-01-24 NOTE — Progress Notes (Signed)
Patient had increase WOB around 1330, RR's high 20's - 30's, sat's around 90% on [place] 4L.  Patient able to be aroused but very drowsy.  Able to nod head appropriately and follow commands but still not speaking well since extubated this morning, only "mouthing" words and some whispering intermittently.  ABG obtained and Dr. Bard Herbert at bedside to assess.  Orders received to have respiratory NTS PRN and wean O2 appropriately.   Patient placed on Venturi mask later in the day around 4pm.  Maintaining sat's 95% and above.  RR 20's. Patient denies pain and still lethargic.  Dr Bard Herbert back to assess and wants to continue with current care.  Dialysis was started around 1645.  Will continue to monitor.

## 2012-01-24 NOTE — Procedures (Signed)
Extubation Procedure Note  Patient Details:   Name: [name] DOB: 07/18/1960 MRN: [id number]   Airway Documentation:  AIRWAYS 7 mm (Active)    Evaluation  O2 sats: stable throughout Complications: No apparent complications Patient did tolerate procedure well. Bilateral Breath Sounds: Rhonchi Suctioning: Airway;Oral Yes   Parke Poisson 01/24/2012, 10:24 AM

## 2012-01-24 NOTE — Progress Notes (Signed)
[facility]  Subjective:  Intubated, sedation off.  Extubation planned soon Responds to questions   Objective: Vital signs in last 24 hours: Blood pressure 121/49, pulse 75, temperature 98.6 F (37 C), temperature source Oral, resp. rate 18, height 5\' 3"  (1.6 m), weight 95.3 kg (210 lb 1.6 oz), SpO2 98.00%.    PHYSICAL EXAM General--as above  Chest--rhonchi Heart--no rub Abd--nontender Extr--AVF patent L upper arm, L BKA, scratches from cat on ant thighs   Date  Weight 29 Sep  98.8 kg 30 Sep  95.8  1 Oct  95.3  EDW (estimated dry weight) at outpt dialysis ctr--88 kg Lab Results:   Lab 01/23/12 0354 01/22/12 2315 01/22/12 0450  NA 130* 134* 134*  K 4.8 3.7 4.2  CL 92* 97 95*  CO2 18* 23 13*  BUN 72* 39* 62*  CREATININE 6.41* 4.28* 5.59*  ALB -- -- --  GLUCOSE 123* -- --  CALCIUM 6.8* 7.2* 6.9*  PHOS -- 6.4* 10.3*     Basename 01/23/12 0354 01/22/12 2315  WBC 14.1* 9.7  HGB 11.6* 10.2*  HCT 35.4* 30.4*  PLT 131* 108*       Assessment/Plan: 1. Acute respiratory failure, secondary to cardiac arrest - intubated, off levophed; chest x-ray shows CM with  interstitial edema.ialyzed yesterday.   Sepsis, blood cultures neg so far, on Vancomycin and Primaxin. Husband says scratches on LE are from cat.  Dialyze again today.  2. Acidosis -ABG yesterday pH 7.37, pO2 139, pCO2 33 on 40% FIO2. off IV bicarb.  Lab to be done in dialysis today 3. ESRD - HD on TTS @ [facility], K 4.2. HD today 10/1--3.5 L off.  4. Hypertension/volume - BP better--pressors off, chest x-ray still wet 5. Anemia - Hgb 11.6--restart EPO, check Fe studies when back from outpt ctr.Fe/TIBC 21 % on 28 Sep.  Rx IV Fe 6. Metabolic bone disease - Ca 6.9 (7.5 corrected), P 10.3. Was purportedly on Renvela powder 1600 TID, phosL3 TID, Fosrenol 1 g TID.   Restart renvela 3 TID 7. Nutrition - Alb 3.2. 8. DM - BG 123 this AM, on SSI 9. Hx fungal sepsis - BCs + for Candida parapsilosis in 02/2011,  TEE negative.    LOS: 3 days   [name] F 01/24/2012,9:52 AM   .labalb

## 2012-01-24 NOTE — Progress Notes (Signed)
Name: [name] MRN: [id number] DOB: [birth year]    LOS: 3  PULMONARY / CRITICAL CARE MEDICINE  PROFILE:   51 years old female with PMH of ESRD, IDDM, HTN, PVD, L BKA, recent amputation of left middle finger about two weeks PTA. Suffered PEA arrest 9/28 with approx 10 mins pulselessness. Did not undergo hypothermia protocol. Treated for presumed sepsis based on elevated PCT and hx of osteomyelitis   Studies/Events: 9/29 CT head: no acute intracranial processes 9/29 Echo: LVEF 60-65%. RVSP est 50 mmHg 9/29 L hand Xray: Status post amputation of the long finger with cortical irregularity at the amputation site worrisome for osteomyelitis. 9/29 R foot Xray: No acute finding.  Lines, Tubes, etc: ETT 9/28 >> 10/01 R IJ CVL 9/29 >>  R radial A-line 9/29 >>   Microbiology: Procalcitonin: 9/29 >> 7.94,  9//30 >> 21.05,   MRSA PCR 9/28 >> NEG Blood 9/29 >>  resp 10/01 >>   Antibiotics:  Vanc  9/29 >>  Imipenem  9/29 >>    Consults:  Renal    Best Practice: DVT: sq heparin SUP: PPI Nutrition: NPO after ext Glycemic control:  SSI Sedation/analgesia:  PRN fentanyl    Current Status: RASS 0. + F/C. Passed SBT and extubated. Looks good early post-ext  Vital Signs: Temp:  [97.8 F (36.6 C)-99 F (37.2 C)] 97.9 F (36.6 C) (10/01 1226) Pulse Rate:  [71-92] 71  (10/01 1130) Resp:  [3-24] 17  (10/01 1130) BP: (108-153)/(40-81) 131/77 mmHg (10/01 0945) SpO2:  [98 %-100 %] 99 % (10/01 1130) Arterial Line BP: (98-154)/(44-78) 118/52 mmHg (10/01 1130) FiO2 (%):  [40 %] 40 % (10/01 0800) Weight:  [95.3 kg (210 lb 1.6 oz)-98.8 kg (217 lb 13 oz)] 95.3 kg (210 lb 1.6 oz) (10/01 0500)  Physical Examination: General:  NAD Neuro:  MAEs, + F/C, RASS 0 HEENT:  PERRL, pink conjunctivae, moist membranes Neck:  Supple, no JVD   Cardiovascular:  RRR, no M/R/G Lungs:  Clear anteriorly Abdomen:  Soft, nontender, nondistended, bowel sounds present Musculoskeletal:  Left BKA. Left  hand dressed -sutures of left middle digital amputn surgery. AV fistula on left arm. No pedal edema Skin:  Diffuse scratches on RLE (by cat per pt's husband)   BMET    Component Value Date/Time   NA 135 01/24/2012 0952   K 4.1 01/24/2012 0952   CL 96 01/24/2012 0952   CO2 24 01/24/2012 0952   GLUCOSE 91 01/24/2012 0952   BUN 45* 01/24/2012 0952   CREATININE 4.94* 01/24/2012 0952   CALCIUM 7.1* 01/24/2012 0952   CALCIUM 8.0* 03/02/2009 1350   GFRNONAA 9* 01/24/2012 0952   GFRAA 11* 01/24/2012 0952    CBC    Component Value Date/Time   WBC 8.8 01/24/2012 0952   RBC 3.41* 01/24/2012 0952   HGB 9.9* 01/24/2012 0952   HCT 30.3* 01/24/2012 0952   PLT 113* 01/24/2012 0952   MCV 88.9 01/24/2012 0952   MCH 29.0 01/24/2012 0952   MCHC 32.7 01/24/2012 0952   RDW 16.3* 01/24/2012 0952   LYMPHSABS 3.1 01/21/2012 2010   MONOABS 1.0 01/21/2012 2010   EOSABS 0.2 01/21/2012 2010   BASOSABS 0.1 01/21/2012 2010   ABG    Component Value Date/Time   PHART 7.369 01/23/2012 0359   PCO2ART 33.4* 01/23/2012 0359   PO2ART 139.0* 01/23/2012 0359   HCO3 19.3* 01/23/2012 0359   TCO2 20 01/23/2012 0359   ACIDBASEDEF 5.0* 01/23/2012 0359   O2SAT 99.0 01/23/2012 0359  Lab 01/23/12 0401 01/22/12 1158 01/22/12 0450 01/22/12 0056 01/21/12 2200  TROPONINI -- <0.30 <0.30 <0.30 --  LATICACIDVEN 1.2 -- 5.6* -- 9.1*  PROBNP -- -- -- -- --     CXR: interstitial prominence, probable improvement in R effusion    ASSESSMENT AND PLAN   1) Cardiac arrest (initial rhythm PEA) - etiology unclear 2) VDRF after cardiac arrest - looks good post extubation 10/01 3) Bilateral lung infiltrates/effusions, likely edema. 4) ESRD on HD via AV fistula 5) Suspected sepsis 6) Lactic acidosis - resolved 7) Shock - resolved. Off pressors 9/30. At this point working dx is sepsis. Suspect PEA in setting of acidosis and hypoperfusion. CEs negative.  8) H/O DM2, transient hypoglycemia 9/30 - resolved with D10 9) anemia 10) mild  thrombocytopenia 11) R hand osteomyelitis 12) Post cardiac arrest encephalopathy. Much improved   Plan: Monitor post extubation in ICU Cont current abx and f/u cx results. Repeat PCT AM 10/02 Discussed with Dr Caryn Section - consider further HD today Will D/C SQ heparin due to dropping plts Monitor CBC NPO post extubation. Swallow eval ordered for AM 10/02    01/24/2012, 1:10 PM   CC time x  45 m Husband and daughter updated @ bedside   Billy Fischer, MD ; [facility] [phone].  After 5:30 PM or weekends, call [phone]

## 2012-01-24 NOTE — Progress Notes (Signed)
Hypoglycemic Event  CBG: 62  Treatment: 25ml D50  Symptoms: none  Follow-up CBG: Time:0415 CBG Result:125  Possible Reasons for Event: unknown Comments/MD notified:    [name]  Remember to initiate Hypoglycemia Order Set & complete

## 2012-01-25 ENCOUNTER — Inpatient Hospital Stay (HOSPITAL_COMMUNITY): Payer: Medicare Other

## 2012-01-25 DIAGNOSIS — E119 Type 2 diabetes mellitus without complications: Secondary | ICD-10-CM

## 2012-01-25 LAB — CBC
HCT: 31.8 % — ABNORMAL LOW (ref 36.0–46.0)
Hemoglobin: 10.2 g/dL — ABNORMAL LOW (ref 12.0–15.0)
MCHC: 32.1 g/dL (ref 30.0–36.0)
MCV: 90.6 fL (ref 78.0–100.0)
WBC: 7.5 10*3/uL (ref 4.0–10.5)

## 2012-01-25 LAB — BASIC METABOLIC PANEL
BUN: 24 mg/dL — ABNORMAL HIGH (ref 6–23)
CO2: 27 mEq/L (ref 19–32)
Chloride: 98 mEq/L (ref 96–112)
Creatinine, Ser: 3.41 mg/dL — ABNORMAL HIGH (ref 0.50–1.10)
GFR calc Af Amer: 17 mL/min — ABNORMAL LOW (ref >90)
Potassium: 3.5 mEq/L (ref 3.5–5.1)

## 2012-01-25 LAB — GLUCOSE, CAPILLARY
Glucose-Capillary: 71 mg/dL (ref 70–99)
Glucose-Capillary: 78 mg/dL (ref 70–99)
Glucose-Capillary: 80 mg/dL (ref 70–99)

## 2012-01-25 LAB — PRO B NATRIURETIC PEPTIDE: Pro B Natriuretic peptide (BNP): 69207 pg/mL — ABNORMAL HIGH (ref 0–125)

## 2012-01-25 MED ORDER — LIDOCAINE HCL 2 % EX GEL
Freq: Once | CUTANEOUS | Status: AC
  Administered 2012-01-25: 5
  Filled 2012-01-25: qty 5

## 2012-01-25 MED ORDER — VANCOMYCIN HCL IN DEXTROSE 1-5 GM/200ML-% IV SOLN
1000.0000 mg | Freq: Once | INTRAVENOUS | Status: AC
  Administered 2012-01-25: 1000 mg via INTRAVENOUS
  Filled 2012-01-25: qty 200

## 2012-01-25 MED ORDER — DEXTROSE-NACL 5-0.45 % IV SOLN
INTRAVENOUS | Status: DC
  Administered 2012-01-25: 10:00:00 via INTRAVENOUS

## 2012-01-25 MED ORDER — LIDOCAINE VISCOUS 2 % MT SOLN
20.0000 mL | OROMUCOSAL | Status: DC | PRN
  Filled 2012-01-25: qty 20

## 2012-01-25 MED ORDER — HEPARIN SODIUM (PORCINE) 1000 UNIT/ML DIALYSIS
20.0000 [IU]/kg | INTRAMUSCULAR | Status: DC | PRN
  Administered 2012-01-25: 1800 [IU] via INTRAVENOUS_CENTRAL
  Filled 2012-01-25: qty 2

## 2012-01-25 MED ORDER — FENTANYL CITRATE 0.05 MG/ML IJ SOLN
12.5000 ug | INTRAMUSCULAR | Status: DC | PRN
  Administered 2012-01-25 (×3): 12.5 ug via INTRAVENOUS
  Administered 2012-01-26: 25 ug via INTRAVENOUS
  Administered 2012-01-26 – 2012-01-27 (×3): 12.5 ug via INTRAVENOUS
  Filled 2012-01-25 (×4): qty 2

## 2012-01-25 NOTE — Progress Notes (Signed)
RT note Nasal trumpet placed per MD order with lidocaine jelly due to frequent NTS. Pt tolerated well with no complications noted. NTS performed with no complications. Moderate amount, pink tinged.

## 2012-01-25 NOTE — Progress Notes (Signed)
Name: RANE DUMM MRN: [id number] DOB: [birth year]    LOS: 4  PULMONARY / CRITICAL CARE MEDICINE  PROFILE:   51 years old female with PMH of ESRD, IDDM, HTN, PVD, L BKA, recent amputation of left middle finger about two weeks PTA. Suffered PEA arrest 9/28 with approx 10 mins pulselessness. Did not undergo hypothermia protocol. Treated for presumed sepsis based on elevated PCT and hx of osteomyelitis   Studies/Events: 9/29 CT head: no acute intracranial processes 9/29 Echo: LVEF 60-65%. RVSP est 50 mmHg 9/29 L hand Xray: Status post amputation of the long finger with cortical irregularity at the amputation site worrisome for osteomyelitis. 9/29 R foot Xray: No acute finding.  Lines, Tubes, etc: ETT 9/28 >> 10/01 R IJ CVL 9/29 >>  R radial A-line 9/29 >> 10/02  Microbiology: Procalcitonin: 9/29 >> 7.94,  9//30 >> 21.05,   MRSA PCR 9/28 >> NEG Blood 9/29 >>  resp 10/01 >>   Antibiotics:  Vanc  9/29 >> 10/02 Imipenem  9/29 >>    Consults:  Renal    Best Practice: DVT: sq heparin SUP: PPI Nutrition: NPO after ext Glycemic control:  SSI Sedation/analgesia:  PRN fentanyl    Current Status: RASS 0. + F/C. Remains marginal post extubation with poor clearance of UA secretions requiring frequent NTS  Vital Signs: Temp:  [96.8 F (36 C)-98.3 F (36.8 C)] 97.4 F (36.3 C) (10/02 1825) Pulse Rate:  [53-108] 89  (10/02 1825) Resp:  [14-27] 20  (10/02 1825) BP: (93-162)/(29-104) 144/93 mmHg (10/02 1825) SpO2:  [91 %-100 %] 100 % (10/02 1825) Arterial Line BP: (143-147)/(54-58) 147/58 mmHg (10/01 2100) FiO2 (%):  [40 %-50 %] 40 % (10/02 1000) Weight:  [90.4 kg (199 lb 4.7 oz)-93.5 kg (206 lb 2.1 oz)] 90.4 kg (199 lb 4.7 oz) (10/02 1825)  Physical Examination: General:  NAD Neuro:  MAEs, + F/C, RASS 0 HEENT:  PERRL, pink conjunctivae, moist membranes Neck:  Supple, no JVD   Cardiovascular:  RRR, no M/R/G Lungs:  Clear anteriorly Abdomen:  Soft, nontender,  nondistended, bowel sounds present Musculoskeletal:  Left BKA. Left hand dressed -sutures of left middle digital amputn surgery. AV fistula on left arm. No pedal edema Skin:  Diffuse scratches on RLE (by cat per pt's husband)   BMET    Component Value Date/Time   NA 137 01/25/2012 0420   K 3.5 01/25/2012 0420   CL 98 01/25/2012 0420   CO2 27 01/25/2012 0420   GLUCOSE 78 01/25/2012 0420   BUN 24* 01/25/2012 0420   CREATININE 3.41* 01/25/2012 0420   CALCIUM 8.0* 01/25/2012 0420   CALCIUM 8.0* 03/02/2009 1350   GFRNONAA 15* 01/25/2012 0420   GFRAA 17* 01/25/2012 0420    CBC    Component Value Date/Time   WBC 7.5 01/25/2012 0420   RBC 3.51* 01/25/2012 0420   HGB 10.2* 01/25/2012 0420   HCT 31.8* 01/25/2012 0420   PLT 97* 01/25/2012 0420   MCV 90.6 01/25/2012 0420   MCH 29.1 01/25/2012 0420   MCHC 32.1 01/25/2012 0420   RDW 16.4* 01/25/2012 0420   LYMPHSABS 3.1 01/21/2012 2010   MONOABS 1.0 01/21/2012 2010   EOSABS 0.2 01/21/2012 2010   BASOSABS 0.1 01/21/2012 2010   ABG    Component Value Date/Time   PHART 7.478* 01/24/2012 1327   PCO2ART 36.0 01/24/2012 1327   PO2ART 53.0* 01/24/2012 1327   HCO3 26.8* 01/24/2012 1327   TCO2 28 01/24/2012 1327   ACIDBASEDEF 5.0* 01/23/2012 0359  O2SAT 90.0 01/24/2012 1327    Lab 01/23/12 0401 01/22/12 1158 01/22/12 0450 01/22/12 0056 01/21/12 2200  TROPONINI -- <0.30 <0.30 <0.30 --  LATICACIDVEN 1.2 -- 5.6* -- 9.1*  PROBNP -- -- -- -- --     CXR: NSC    ASSESSMENT AND PLAN   1) Cardiac arrest (initial rhythm PEA) - etiology unclear 2) VDRF after cardiac arrest - marginal post extubation due to UA secretions 3) Bilateral lung infiltrates/effusions, likely edema. 4) ESRD on HD via AV fistula 5) Suspected sepsis - unclear source. Elevated PCT. Negative cx's 6) Lactic acidosis - resolved 7) Shock - resolved. Off pressors 9/30.  8) H/O DM2, transient hypoglycemia 9/30 - resolved 9) anemia 10) mild thrombocytopenia 11) R hand osteomyelitis 12) Post  cardiac arrest encephalopathy. Much improved   Plan: Cont to monitor in ICU Nasal trumpet placed for frequent NTS D/C Vanc, Cont imipenem Discussed with Dr Caryn Section - resume TTS HD schedule Monitor plts off heparin (and Vanc) Monitor CBC NPO post extubation. Swallow eval ordered for AM 10/03 Might require formal Neuro eval for prognostication   01/25/2012, 7:26 PM   CC time x  35 m Husband updated @ bedside   Billy Fischer, MD ; [facility] [phone].  After 5:30 PM or weekends, call [phone]

## 2012-01-25 NOTE — Progress Notes (Signed)
[facility]  Subjective:  Awake,extubated, sleepy, responds to questions.  Mental status still far from baseline On Vanco + primaxin   Objective: Vital signs in last 24 hours: Blood pressure 127/45, pulse 69, temperature 97.9 F (36.6 C), temperature source Oral, resp. rate 22, height 5\' 3"  (1.6 m), weight 91.4 kg (201 lb 8 oz), SpO2 99.00%.    PHYSICAL EXAM General--as above Chest--rhonchi Heart--no rub Abd--nontender Extr--edema of UE, AVF patent L upper arm, L BKA, scratches (from cat) on legs  Lab Results:   Lab 01/25/12 0420 01/24/12 0952 01/23/12 0354 01/22/12 2315 01/22/12 0450  NA 137 135 130* -- --  K 3.5 4.1 4.8 -- --  CL 98 96 92* -- --  CO2 27 24 18* -- --  BUN 24* 45* 72* -- --  CREATININE 3.41* 4.94* 6.41* -- --  ALB -- -- -- -- --  GLUCOSE 78 -- -- -- --  CALCIUM 8.0* 7.1* 6.8* -- --  PHOS -- 7.1* -- 6.4* 10.3*     Basename 01/25/12 0420 01/24/12 0952  WBC 7.5 8.8  HGB 10.2* 9.9*  HCT 31.8* 30.3*  PLT 97* 113*   CXR still looks wet  Date   Weight 29 Sep  98.8 kg  30 Sep  95.8   1 Oct   95.3   2 Oct   91.4  EDW (estimated dry weight) at outpt dialysis ctr--88 kg  Assessment/Plan: 1.  Acute respiratory failure, secondary to cardiac arrest - intubated, off levophed; chest x-ray shows CM with interstitial edema.ialyzed yesterday. Sepsis, blood cultures neg so far, on Vancomycin and Primaxin. Husband says scratches on LE are from cat.  X Ray still looks wet and she's several kg > EDW-- Dialyze again today. 2.   Acidosis -ABG yesterday pH 7.47, pO2 53, pCO2 36 on 40% venturi mask. off IV bicarb. Bicarb today 27 3.  ESRD - HD on TTS @ [facility], K 3.5. HD today 4K bath, 3.5 L off.  4.  Hypertension/volume - BP 127/45 today  5.  Anemia - Hgb 11.6--restart EPO, check Fe studies when back from outpt ctr.Fe/TIBC 21 % on 28 Sep. Rx IV Fe x 6.  6.  Metabolic bone disease - Ca 8 (9.6 corrected), P 7.1. Was purportedly on Renvela powder 1600 TID,  phosL3 TID, Fosrenol 1 g TID. Restart renvela 3 TID 7.   Nutrition - Alb 3.2 down to 1.9 today!.  8.  DM - BG 81 this AM, on SSI  9.  Hx fungal sepsis - BCs + for Candida parapsilosis in 02/2011, TEE negative    LOS: 4 days   Jovin Fester F 01/25/2012,10:02 AM   .labalb

## 2012-01-25 NOTE — Evaluation (Signed)
Physical Therapy Evaluation Patient Details Name: [name] MRN: [id number] DOB: [birth year] Today's Date: 01/25/2012 Time: 0865-7846 PT Time Calculation (min): 28 min  PT Assessment / Plan / Recommendation Clinical Impression  Pt adm after cardiac arrest and respiratory failure.  Pt with significant cognitive and mobility deficits.  Expect progress will be very slow.  Recommend LTACH.    PT Assessment  Patient needs continued PT services    Follow Up Recommendations  Post acute inpatient rehab    Barriers to Discharge Decreased caregiver support      Equipment Recommendations  None recommended by PT    Recommendations for Other Services     Frequency Min 3X/week    Precautions / Restrictions Precautions Precautions: Fall   Pertinent Vitals/Pain VSS.  Pt restless but didn't show signs of pain.      Mobility  Bed Mobility Bed Mobility: Rolling Left;Supine to Sit;Sitting - Scoot to [place] of Bed;Sit to Supine Rolling Left: 1: +1 Total assist Supine to Sit: 1: +2 Total assist Supine to Sit: Patient Percentage: 0% Sitting - Scoot to Edge of Bed: 1: +2 Total assist Sitting - Scoot to Edge of Bed: Patient Percentage: 0% Sit to Supine: 1: +2 Total assist Sit to Supine: Patient Percentage: 0% Transfers Details for Transfer Assistance: attempted transfer to chair with maxisky but lift with dead battery.  Put unit on charger.    Shoulder Instructions     Exercises     PT Diagnosis: Difficulty walking;Generalized weakness;Altered mental status  PT Problem List: Decreased strength;Decreased balance;Decreased mobility;Decreased cognition;Decreased knowledge of use of DME;Decreased knowledge of precautions;Decreased safety awareness PT Treatment Interventions: DME instruction;Functional mobility training;Therapeutic activities;Balance training;Therapeutic exercise;Patient/family education   PT Goals Acute Rehab PT Goals PT Goal Formulation: Patient unable to participate in  goal setting Time For Goal Achievement: 02/08/12 Potential to Achieve Goals: Fair Pt will Roll Supine to Right Side: with mod assist PT Goal: Rolling Supine to Right Side - Progress: Goal set today Pt will Roll Supine to Left Side: with mod assist PT Goal: Rolling Supine to Left Side - Progress: Goal set today Pt will go Supine/Side to Sit: with max assist PT Goal: Supine/Side to Sit - Progress: Goal set today Pt will Sit at Edge of Bed: with mod assist;6-10 min;with bilateral upper extremity support PT Goal: Sit at Edge Of Bed - Progress: Goal set today Pt will go Sit to Supine/Side: with max assist PT Goal: Sit to Supine/Side - Progress: Goal set today Pt will Transfer Bed to Chair/Chair to Bed: Other (comment) (using lift) PT Transfer Goal: Bed to Chair/Chair to Bed - Progress: Goal set today  Visit Information  Last PT Received On: 01/25/12 Assistance Needed: +2    Subjective Data  Subjective: No verbalizations. Did nod and shake head. Patient Stated Goal: Pt unable to state.   Prior Functioning  Home Living Lives With: Spouse Available Help at Discharge: Family;Available PRN/intermittently Type of Home: Mobile home Home Access: Stairs to enter Entrance Stairs-Number of Steps: 4-5 Entrance Stairs-Rails: Left;Right Home Layout: One level Bathroom Shower/Tub: Engineer, manufacturing systems: Standard Home Adaptive Equipment: Wheelchair - manual;Straight cane;Walker - rolling;Bedside commode/3-in-1 Prior Function Level of Independence: Independent with assistive device(s) (Uses cane for amb) Able to Take Stairs?: Yes Driving: Yes Vocation: On disability    Cognition  Overall Cognitive Status: Difficult to assess Area of Impairment: Following commands Difficult to assess due to: Impaired communication (pt not verbalizing) Arousal/Alertness: Awake/alert Behavior During Session: Restless Following Commands: Follows one step commands  inconsistently    Extremity/Trunk  Assessment Right Lower Extremity Assessment RLE ROM/Strength/Tone: Unable to fully assess;Due to impaired cognition (active movement noted) Left Lower Extremity Assessment LLE ROM/Strength/Tone: Unable to fully assess;Due to impaired cognition (active movement noted)   Balance Static Sitting Balance Static Sitting - Balance Support: Feet unsupported;Bilateral upper extremity supported Static Sitting - Level of Assistance: 1: +1 Total assist Static Sitting - Comment/# of Minutes: Sat 10 minutes EOB with total assist.  Pt with posterior lean and no righting reactions.  End of Session PT - End of Session Activity Tolerance: Patient tolerated treatment well Patient left: in bed;with call bell/phone within reach;with family/visitor present Nurse Communication: Mobility status;Need for lift equipment  GP     Avis Tirone 01/25/2012, 11:54 AM  [facility] PT [phone]

## 2012-01-25 NOTE — Progress Notes (Signed)
SLP Cancellation Note  Treatment cancelled today due to medical issues with patient which prohibited therapy.  RR elevated and patient requiring frequent suction (NTS).  MD discontinued order for today, and requested that Swallow Evaluation be completed 10/3.  SLP will return 10/3.   Maryjo Rochester T 01/25/2012, 4:50 PM

## 2012-01-26 ENCOUNTER — Inpatient Hospital Stay (HOSPITAL_COMMUNITY): Payer: Medicare Other

## 2012-01-26 DIAGNOSIS — D696 Thrombocytopenia, unspecified: Secondary | ICD-10-CM

## 2012-01-26 LAB — GLUCOSE, CAPILLARY
Glucose-Capillary: 67 mg/dL — ABNORMAL LOW (ref 70–99)
Glucose-Capillary: 83 mg/dL (ref 70–99)

## 2012-01-26 LAB — CULTURE, RESPIRATORY W GRAM STAIN

## 2012-01-26 LAB — COMPREHENSIVE METABOLIC PANEL
ALT: 6 U/L (ref 0–35)
BUN: 17 mg/dL (ref 6–23)
CO2: 28 mEq/L (ref 19–32)
Calcium: 8.4 mg/dL (ref 8.4–10.5)
Creatinine, Ser: 2.85 mg/dL — ABNORMAL HIGH (ref 0.50–1.10)
GFR calc Af Amer: 21 mL/min — ABNORMAL LOW (ref >90)
GFR calc non Af Amer: 18 mL/min — ABNORMAL LOW (ref >90)
Glucose, Bld: 90 mg/dL (ref 70–99)

## 2012-01-26 LAB — CBC
HCT: 33.9 % — ABNORMAL LOW (ref 36.0–46.0)
Hemoglobin: 10.7 g/dL — ABNORMAL LOW (ref 12.0–15.0)
MCH: 29.2 pg (ref 26.0–34.0)
MCV: 92.6 fL (ref 78.0–100.0)
RBC: 3.66 MIL/uL — ABNORMAL LOW (ref 3.87–5.11)

## 2012-01-26 MED ORDER — DEXTROSE 50 % IV SOLN
25.0000 mL | Freq: Once | INTRAVENOUS | Status: AC | PRN
  Administered 2012-01-26: 25 mL via INTRAVENOUS
  Filled 2012-01-26: qty 50

## 2012-01-26 MED ORDER — HEPARIN SODIUM (PORCINE) 1000 UNIT/ML DIALYSIS
20.0000 [IU]/kg | INTRAMUSCULAR | Status: DC | PRN
  Administered 2012-01-27: 1800 [IU] via INTRAVENOUS_CENTRAL
  Filled 2012-01-26: qty 2

## 2012-01-26 NOTE — Progress Notes (Signed)
[facility]  Subjective:  More alert and responsive, husband at bedside    Objective: Vital signs in last 24 hours: Blood pressure 144/76, pulse 87, temperature 96.3 F (35.7 C), temperature source Axillary, resp. rate 15, height 5\' 3"  (1.6 m), weight 89.4 kg (197 lb 1.5 oz), SpO2 100.00%.    PHYSICAL EXAM General--as above Chest--rhonchi Heart--no rub Abd--nontender Extr--AVF patent L upper arm, L BKA, missing 1st and 2nd toes on Rfoot  and middle fingers bilat.  Ischemic ulcers on          fingers L hand  Lab Results:   Lab 01/26/12 0410 01/25/12 0420 01/24/12 0952 01/22/12 2315 01/22/12 0450  NA 139 137 135 -- --  K 3.7 3.5 4.1 -- --  CL 100 98 96 -- --  CO2 28 27 24  -- --  BUN 17 24* 45* -- --  CREATININE 2.85* 3.41* 4.94* -- --  ALB -- -- -- -- --  GLUCOSE 90 -- -- -- --  CALCIUM 8.4 8.0* 7.1* -- --  PHOS -- -- 7.1* 6.4* 10.3*     Basename 01/26/12 0410 01/25/12 0420  WBC 6.0 7.5  HGB 10.7* 10.2*  HCT 33.9* 31.8*  PLT 119* 97*      Assessment/Plan:  1.  Acute respiratory failure, secondary to cardiac arrest - extubated, off levophed; chest x-ray shows CM with      interstitial      edema.  Dialyzed yesterday. 2.  Sepsis, blood cultures neg so far, on Vancomycin and Primaxin. Husband says scratches on LE are from cat.  3.   Acidosis -resolved 4.   ESRD - HD on TTS @ [facility], K 3.7. HD tomorrow 3K bath, wt to 86 kg. X       Ray still looks wet and she's several       kg > EDW-- Dialyze again tomorrow.  5.  Hypertension/volume - BP 144/76 today  6.  Anemia - Hgb 10.7--on  EPO,  Fe studies from outpt ctr.--Fe/TIBC 21 % (28 Sep). Rx IV Fe x 6.  7.  Metabolic bone disease - Ca 8.4 (alb 2.2--Ca 10.8 corrected), P 7.1. Was on Renvela powder 1600 TID PTA and           phosLo3 TID, Fosrenol 1 g TID. Restart renvela 3 TID.  No phosLo with high Ca  8.  Nutrition - Alb 3.2 down to 2.2 today!.  9.  DM - BG 87 this AM, on SSI  10. Hx fungal sepsis - BCs +  for Candida parapsilosis in 02/2011, TEE negative   LOS: 5 days   Taliyah Watrous F 01/26/2012,9:57 AM   .labalb

## 2012-01-26 NOTE — Progress Notes (Signed)
RT note Nasal trumpet removed without any complications noted.

## 2012-01-26 NOTE — Progress Notes (Signed)
Name: [name] MRN: [id number] DOB: 11-13-1960    LOS: 5  PULMONARY / CRITICAL CARE MEDICINE  PROFILE:   51 years old female with PMH of ESRD, IDDM, HTN, PVD, L BKA, recent amputation of left middle finger about two weeks PTA. Suffered PEA arrest 9/28 with approx 10 mins pulselessness. Did not undergo hypothermia protocol. Treated for presumed sepsis based on elevated PCT and hx of osteomyelitis   Studies/Events: 9/29 CT head: no acute intracranial processes 9/29 Echo: LVEF 60-65%. RVSP est 50 mmHg 9/29 L hand Xray: Status post amputation of the long finger with cortical irregularity at the amputation site worrisome for osteomyelitis. 9/29 R foot Xray: No acute finding. 10/03 Failed SLP swallow eval  Lines, Tubes, etc: ETT 9/28 >> 10/01 R IJ CVL 9/29 >>  R radial A-line 9/29 >> 10/02  Microbiology: Procalcitonin: 9/29 >> 7.94,  9//30 >> 21.05,  10/02 >> 14.86, 10/03 >> 11.85 MRSA PCR 9/28 >> NEG Blood 9/29 >>  resp 10/01 >>   Antibiotics:  Vanc  9/29 >> 10/02 Imipenem  9/29 >>    Consults:  Renal    Best Practice: DVT: sq heparin SUP: PPI Nutrition: NPO after ext Glycemic control:  SSI Sedation/analgesia:  PRN fentanyl    Current Status: RASS 0. + F/C. Less dyspneic. Cough more effective  Vital Signs: Temp:  [96.3 F (35.7 C)-98.2 F (36.8 C)] 97.5 F (36.4 C) (10/03 1607) Pulse Rate:  [85-93] 86  (10/03 1500) Resp:  [12-26] 18  (10/03 1500) BP: (91-174)/(50-123) 102/74 mmHg (10/03 1500) SpO2:  [91 %-100 %] 100 % (10/03 1500) Weight:  [89.4 kg (197 lb 1.5 oz)-90.4 kg (199 lb 4.7 oz)] 89.4 kg (197 lb 1.5 oz) (10/03 0500)  Physical Examination: General:  NAD Neuro:  MAEs, + F/C, RASS 0 HEENT:  PERRL, pink conjunctivae, moist membranes Neck:  Supple, no JVD   Cardiovascular:  RRR, no M/R/G Lungs:  Clear anteriorly Abdomen:  Soft, nontender, nondistended, bowel sounds present Musculoskeletal:  Left BKA. Left hand dressed -sutures of left middle  digital amputn surgery. AV fistula on left arm. No pedal edema Skin:  Diffuse scratches on RLE (by cat per pt's husband)   BMET    Component Value Date/Time   NA 139 01/26/2012 0410   K 3.7 01/26/2012 0410   CL 100 01/26/2012 0410   CO2 28 01/26/2012 0410   GLUCOSE 90 01/26/2012 0410   BUN 17 01/26/2012 0410   CREATININE 2.85* 01/26/2012 0410   CALCIUM 8.4 01/26/2012 0410   CALCIUM 8.0* 03/02/2009 1350   GFRNONAA 18* 01/26/2012 0410   GFRAA 21* 01/26/2012 0410    CBC    Component Value Date/Time   WBC 6.0 01/26/2012 0410   RBC 3.66* 01/26/2012 0410   HGB 10.7* 01/26/2012 0410   HCT 33.9* 01/26/2012 0410   PLT 119* 01/26/2012 0410   MCV 92.6 01/26/2012 0410   MCH 29.2 01/26/2012 0410   MCHC 31.6 01/26/2012 0410   RDW 16.2* 01/26/2012 0410   LYMPHSABS 3.1 01/21/2012 2010   MONOABS 1.0 01/21/2012 2010   EOSABS 0.2 01/21/2012 2010   BASOSABS 0.1 01/21/2012 2010   ABG    Component Value Date/Time   PHART 7.478* 01/24/2012 1327   PCO2ART 36.0 01/24/2012 1327   PO2ART 53.0* 01/24/2012 1327   HCO3 26.8* 01/24/2012 1327   TCO2 28 01/24/2012 1327   ACIDBASEDEF 5.0* 01/23/2012 0359   O2SAT 90.0 01/24/2012 1327    Lab 01/23/12 0401 01/22/12 1158 01/22/12 0450 01/22/12 0056  01/21/12 2200  TROPONINI -- <0.30 <0.30 <0.30 --  LATICACIDVEN 1.2 -- 5.6* -- 9.1*  PROBNP -- -- -- -- --     CXR: Increased edema pattern    ASSESSMENT AND PLAN   1) Cardiac arrest (initial rhythm PEA) - etiology unclear 2) VDRF after cardiac arrest - resolved 3) Bilateral lung infiltrates/effusions, likely edema. 4) ESRD on HD via AV fistula 5) Suspected sepsis - unclear source. Elevated PCT. Negative cx's 6) Lactic acidosis - resolved 7) Shock - resolved. Off pressors 9/30.  8) H/O DM2, transient hypoglycemia 9/30 - resolved 9) anemia 10) mild thrombocytopenia, improving 11) suspected R hand osteomyelitis 12) Post cardiac arrest encephalopathy. Improving    Plan: Cont to monitor in ICU Cont imipenem HD per  Renal Service Monitor CBC Cont NPO. SLP to re-eval 10/04. Will likely need feeding tube, ?PEG Might require formal Neuro eval for prognostication   01/26/2012, 5:12 PM   CC time x  35 m Husband updated @ bedside   Billy Fischer, MD ; [facility] [phone].  After 5:30 PM or weekends, call [phone]

## 2012-01-26 NOTE — Evaluation (Signed)
Clinical/Bedside Swallow Evaluation Patient Details  Name: [name] MRN: [id number] Date of Birth: 1960/05/07  Today's Date: 01/26/2012 Time: 7829-5621 SLP Time Calculation (min): 25 min  Past Medical History:  Past Medical History  Diagnosis Date  . Hypertension   . Anemia   . Diabetes mellitus   . Peripheral vascular disease   . Hemodialysis patient 03/15/11    "Tues; Thurs; Sat; [facility]"  . Blood transfusion   . Chronic back pain   . Rash 03/15/11    "admitted me to find out what its' from; UE, waist, thighs, groin"  . Renal failure     hd 4/12   Past Surgical History:  Past Surgical History  Procedure Date  . Insertion of dialysis catheter 07/29/10    right subclavian  . Arteriovenous graft placement 10/01/10    left upper arm  . Below knee leg amputation 2004    left  . Amputation 03/04/2011    Procedure: AMPUTATION DIGIT;  Surgeon: [name], MD;  Location: [facility] OR;  Service: Orthopedics;  Laterality: Right;  RT GREAT TOE AMP  . Cataract extraction w/ intraocular lens implant 12/2009    right eye  . Carpal tunnel release ~ 2009    left wrist  . Tee without cardioversion 03/18/2011    Procedure: TRANSESOPHAGEAL ECHOCARDIOGRAM (TEE);  Surgeon: [name], MD;  Location: [facility] ENDOSCOPY;  Service: Cardiovascular;  Laterality: N/A;   HPI:  51 years old female with PMH of ESRD, IDDM, HTN, PVD, L BKA, recent amputation of left middle finger about two weeks PTA. Suffered PEA arrest 9/28 with approx 10 mins pulselessness. Did not undergo hypothermia protocol. Treated for presumed sepsis based on elevated PCT and hx of osteomyelitis. Intubated from 9/28-10/1. Pt with copius secretions yesterday. Confused.   Assessment / Plan / Recommendation Clinical Impression  Pt presents with a modertae oral dyspahgia due to cognitive impairment. Pt with only briefly focused attention to POs, requires max verbal cues to transit and swallow all boluses. Often  liquid and solid boluses sit in her oral cavity with no transit. Suspect poor containment of bolus with spillage to pharynx and aspiration of thin liquids (delayed and immediate hard coughing). Aspiration risk is high with all consistencies at this time. Would recommend pt remain NPO, SLP will f/u in am tomorrow for PO trials. Pt may need objective test prior to initiating PO. Would recommend MBS to visualize oral phase.     Aspiration Risk  Moderate    Diet Recommendation NPO        Other  Recommendations Recommended Consults: MBS Oral Care Recommendations: Oral care QID   Follow Up Recommendations  Inpatient Rehab    Frequency and Duration        Pertinent Vitals/Pain NA    SLP Swallow Goals Goal #3: Pt will sustain attention to PO trials with min verbal cues for 30 seconds to determine readiness for diet or MBS.    Swallow Study Prior Functional Status       General HPI: 51 years old female with PMH of ESRD, IDDM, HTN, PVD, L BKA, recent amputation of left middle finger about two weeks PTA. Suffered PEA arrest 9/28 with approx 10 mins pulselessness. Did not undergo hypothermia protocol. Treated for presumed sepsis based on elevated PCT and hx of osteomyelitis. Intubated from 9/28-10/1. Pt with copius secretions yesterday. Confused. Type of Study: Bedside swallow evaluation Diet Prior to this Study: NPO Temperature Spikes Noted: No Respiratory Status: Supplemental O2 delivered  via (comment) Behavior/Cognition: Confused;Requires cueing;Distractible;Alert Oral Cavity - Dentition: Adequate natural dentition Self-Feeding Abilities: Total assist Patient Positioning: Upright in bed Baseline Vocal Quality: Clear Volitional Cough: Cognitively unable to elicit Volitional Swallow: Able to elicit    Oral/Motor/Sensory Function Overall Oral Motor/Sensory Function: Appears within functional limits for tasks assessed   Ice Chips     Thin Liquid Thin Liquid: Impaired Presentation:  Cup;Straw Oral Phase Impairments: Reduced labial seal;Poor awareness of bolus;Impaired anterior to posterior transit;Reduced lingual movement/coordination Oral Phase Functional Implications: Right anterior spillage;Left anterior spillage;Prolonged oral transit Pharyngeal  Phase Impairments: Suspected delayed Swallow;Decreased hyoid-laryngeal movement;Cough - Immediate;Cough - Delayed    Nectar Thick Nectar Thick Liquid: Impaired Presentation: Cup Oral Phase Impairments: Reduced labial seal;Poor awareness of bolus;Impaired anterior to posterior transit;Reduced lingual movement/coordination Oral phase functional implications: Prolonged oral transit Pharyngeal Phase Impairments: Suspected delayed Swallow;Decreased hyoid-laryngeal movement   Honey Thick Honey Thick Liquid: Not tested   Puree Puree: Impaired Presentation: Spoon Oral Phase Impairments: Reduced labial seal;Reduced lingual movement/coordination;Impaired anterior to posterior transit;Poor awareness of bolus Oral Phase Functional Implications: Right lateral sulci pocketing;Left lateral sulci pocketing;Prolonged oral transit;Oral residue;Oral holding Pharyngeal Phase Impairments: Suspected delayed Swallow;Decreased hyoid-laryngeal movement   Solid   GO    Solid: Not tested      Harlon Ditty, MA CCC-SLP [phone]  Domonic Kimball, Riley Nearing 01/26/2012,1:46 PM

## 2012-01-26 NOTE — Care Management Note (Signed)
    Page 1 of 2   01/27/2012     4:24:12 PM   CARE MANAGEMENT NOTE 01/27/2012  Patient:  [name], [name]   Account Number:  [id number]  Date Initiated:  01/23/2012  Documentation initiated by:  MAYO,HENRIETTA  Subjective/Objective Assessment:   51 yr-old female with ESRD on HD adm s/p cardiac arrest; lives with family, has h/o home health services through [facility]     Action/Plan:   Anticipated DC Date:  01/29/2012   Anticipated DC Plan:  LONG TERM ACUTE CARE (LTAC)  In-house referral  Clinical Social Worker      DC Planning Services  CM consult      Choice offered to / List presented to:             Status of service:  Completed, signed off Medicare Important Message given?   (If response is "NO", the following Medicare IM given date fields will be blank) Date Medicare IM given:   Date Additional Medicare IM given:    Discharge Disposition:  LONG TERM ACUTE CARE (LTAC)  Per UR Regulation:  Reviewed for med. necessity/level of care/duration of stay  If discussed at Long Length of Stay Meetings, dates discussed:    Comments:  PCP: L[facility] Sender [facility])  Contact: Kalayna Noy, spouse [phone]  01/27/12 Carry Ortez,RN,BSN 1130-2PM NO HD BEDS AVAILABLE AT [facility] TODAY, AND IT IS NOT KNOWN WHEN THEY WILL HAVE [name] BED.  CHECKED WITH [facility] AND THEY DO HAVE HD BEDS AVAILABLE, AND ARE READY TO EXTEND OFFER FOR LTAC ADMISSION.  DR SIMONDS NOTIFIED; STATES PT OK TO DC TO LTAC TODAY.  SPOKE WITH PT'S HUSBAND, CLIFTON, AND HE IS AGREEABLE TO TRANSFER, STATES [facility] IS CLOSE TO HIS HOUSE.  COBRA FORM SIGNED AND COMPLETED.  FAXED COMPLETED DC SUMMARY TO JEANNETTE AT [facility].  LINDA ON 2500 AWARE TO CALL REPORT TO [facility] AND THEN CALL [facility] FOR TRANSPORT.  01/26/12 Emmerson Shuffield,RN,BSN 1115 P.T. STATES PT WOULD BENEFIT FROM LTAC SERVICES.  ORDER OBTAINED FROM MD.  SPOKE WITH FAMILY RE: LTAC, AND THEY PREFER [facility], IF POSSIBLE.  REFERRAL TO  [facility]EN[facility], [facility] FOR ELIGIBILITY.  WILL FOLLOW.

## 2012-01-26 NOTE — Progress Notes (Signed)
Nutrition Follow-up  Intervention:    Await swallow evaluation/SLP recommendations RD to follow for nutrition care plan, add interventions accordingly  Assessment:   Patient extubated 10/1. Nepro formula discontinued via OGT with extubation. Dialyzed yesterday. Swallow evaluation ordered; cancelled yesterday due to medical issues which prohibited therapy.  Diet Order:  NPO  Meds: Scheduled Meds:   . antiseptic oral rinse  15 mL Mouth Rinse QID  . chlorhexidine  15 mL Mouth Rinse BID  . darbepoetin (ARANESP) injection - DIALYSIS  60 mcg Intravenous Q Tue-HD  . imipenem-cilastatin  250 mg Intravenous Q12H  . insulin aspart  0-9 Units Subcutaneous Q4H  . lidocaine   Other Once  . sevelamer  1,600 mg Oral TID WC  . vancomycin  1,000 mg Intravenous Once  . DISCONTD: vancomycin  1,000 mg Intravenous Q T,Th,Sa-HD  . DISCONTD: vancomycin  1,000 mg Intravenous Once   Continuous Infusions:   . DISCONTD: dextrose 5 % and 0.45% NaCl 40 mL/hr at 01/26/12 0900  . DISCONTD: dextrose 5 % and 0.9% NaCl 20 mL/hr at 01/26/12 0900   PRN Meds:.sodium chloride, fentaNYL, heparin, heparin, heparin, heparin, DISCONTD: lidocaine  Labs:  CMP     Component Value Date/Time   NA 139 01/26/2012 0410   K 3.7 01/26/2012 0410   CL 100 01/26/2012 0410   CO2 28 01/26/2012 0410   GLUCOSE 90 01/26/2012 0410   BUN 17 01/26/2012 0410   CREATININE 2.85* 01/26/2012 0410   CALCIUM 8.4 01/26/2012 0410   CALCIUM 8.0* 03/02/2009 1350   PROT 6.4 01/26/2012 0410   ALBUMIN 2.2* 01/26/2012 0410   AST 24 01/26/2012 0410   ALT 6 01/26/2012 0410   ALKPHOS 217* 01/26/2012 0410   BILITOT 0.7 01/26/2012 0410   GFRNONAA 18* 01/26/2012 0410   GFRAA 21* 01/26/2012 0410    Phosphorus  Date Value Range Status  01/24/2012 7.1* 2.3 - 4.6 mg/dL Final    Magnesium  Date Value Range Status  01/22/2012 1.9  1.5 - 2.5 mg/dL Final     Intake/Output Summary (Last 24 hours) at 01/26/12 1103 Last data filed at 01/26/12 0900  Gross per 24  hour  Intake   1310 ml  Output   3156 ml  Net  -1846 ml    CBG (last 3)   Basename 01/26/12 0755 01/26/12 0400 01/25/12 2356  GLUCAP 87 79 75    Weight Status:  89.4 kg (10/3) -- stable  Re-estimated needs:  1950-2150 kcals, 100-110 gm protein  Nutrition Dx:  Inadequate Oral Intake r/t inability to eat as evidenced by NPO status, ongoing  Goal:  Oral intake vs EN re-initiation to meet >/= 90% of estimated nutrition needs, currently unmet  Monitor:  PO diet advancement, EN re-initiation, weight, labs, I/O's  Kirkland Hun, RD, LDN Pager #: [phone] After-Hours Pager #: [phone]

## 2012-01-27 ENCOUNTER — Inpatient Hospital Stay (HOSPITAL_COMMUNITY): Payer: Medicare Other

## 2012-01-27 LAB — GLUCOSE, CAPILLARY: Glucose-Capillary: 108 mg/dL — ABNORMAL HIGH (ref 70–99)

## 2012-01-27 LAB — RENAL FUNCTION PANEL
Albumin: 2.3 g/dL — ABNORMAL LOW (ref 3.5–5.2)
GFR calc Af Amer: 13 mL/min — ABNORMAL LOW (ref >90)
Phosphorus: 5.9 mg/dL — ABNORMAL HIGH (ref 2.3–4.6)
Potassium: 3.8 mEq/L (ref 3.5–5.1)
Sodium: 139 mEq/L (ref 135–145)

## 2012-01-27 LAB — CBC
Platelets: 136 10*3/uL — ABNORMAL LOW (ref 150–400)
RDW: 15.9 % — ABNORMAL HIGH (ref 11.5–15.5)
WBC: 6.1 10*3/uL (ref 4.0–10.5)

## 2012-01-27 MED ORDER — DEXTROSE 50 % IV SOLN: 25.0000 mL | Freq: Once | INTRAVENOUS | Status: DC | PRN

## 2012-01-27 MED ORDER — DEXTROSE 50 % IV SOLN
25.0000 mL | Freq: Once | INTRAVENOUS | Status: AC | PRN
  Administered 2012-01-27: 25 mL via INTRAVENOUS

## 2012-01-27 MED ORDER — INSULIN ASPART 100 UNIT/ML ~~LOC~~ SOLN: 0.0000 [IU] | SUBCUTANEOUS | Status: DC

## 2012-01-27 MED ORDER — SODIUM CHLORIDE 0.9 % IV SOLN: 250.0000 mg | Freq: Three times a day (TID) | INTRAVENOUS | Status: DC

## 2012-01-27 MED ORDER — DEXTROSE 50 % IV SOLN
INTRAVENOUS | Status: AC
  Administered 2012-01-27: 25 mL
  Filled 2012-01-27: qty 50

## 2012-01-27 MED ORDER — SODIUM CHLORIDE 0.9 % IV SOLN: 250.0000 mL | INTRAVENOUS | Status: DC | PRN

## 2012-01-27 MED ORDER — DARBEPOETIN ALFA-POLYSORBATE 60 MCG/0.3ML IJ SOLN: 60.0000 ug | INTRAMUSCULAR | Status: DC

## 2012-01-27 MED ORDER — HEPARIN SODIUM (PORCINE) 1000 UNIT/ML DIALYSIS: 20.0000 [IU]/kg | INTRAMUSCULAR | Status: DC | PRN

## 2012-01-27 NOTE — Progress Notes (Signed)
Hypoglycemic Event  CBG: 68  Treatment: D50   Symptoms:   Follow-up CBG: Time:1245 CBG Result:82  Possible Reasons for Event:   Comments/MD notified:    [name]  Remember to initiate Hypoglycemia Order Set & complete

## 2012-01-27 NOTE — Progress Notes (Signed)
PT Cancellation Note  Treatment cancelled today due to patient receiving procedure or test (pt at HD > 6 hours). Pt now to transfer to [facility].  [name] 01/27/2012, 3:08 PM

## 2012-01-27 NOTE — Progress Notes (Signed)
SLP Cancellation Note  Attempt x1 for dysphagia treatment and not completed as patient in HD. ST to attempt at later time this date.  Moreen Fowler MS, CCC-SLP [phone]  [facility] 01/27/2012, 9:29 AM

## 2012-01-27 NOTE — Progress Notes (Signed)
Patient constantly is moving Left arm and each time the hemodialysis machine alarms.  Dr Caryn Section notified and wrist restraint ordered for length of treatment and then discontinue it.

## 2012-01-27 NOTE — Progress Notes (Signed)
ANTIBIOTIC CONSULT NOTE   Pharmacy Consult for Primaxin Indication: rule out sepsis s/p cardiac arrest  Allergies  Allergen Reactions  . Codeine   . Levofloxacin   . Penicillins     Patient Measurements: Height: 5\' 3"  (160 cm) Weight: 195 lb 15.8 oz (88.9 kg) IBW/kg (Calculated) : 52.4   Vital Signs: Temp: 96.7 F (35.9 C) (10/04 0815) Temp src: Oral (10/04 0815) BP: 129/66 mmHg (10/04 0815) Pulse Rate: 77  (10/04 0815)  Labs:  Basename 01/26/12 0410 01/25/12 0420 01/24/12 0952  WBC 6.0 7.5 8.8  HGB 10.7* 10.2* 9.9*  PLT 119* 97* 113*  LABCREA -- -- --  CREATININE 2.85* 3.41* 4.94*   Estimated Creatinine Clearance: 25.3 ml/min (by C-G formula based on Cr of 2.85).  Microbiology: Recent Results (from the past 720 hour(s))  CULTURE, BLOOD (ROUTINE X 2)     Status: Normal (Preliminary result)   Collection Time   01/22/12 12:50 AM      Component Value Range Status Comment   Specimen Description BLOOD CENTRAL LINE   Final    Special Requests BOTTLES DRAWN AEROBIC AND ANAEROBIC 5CC EA   Final    Culture  Setup Time 01/22/2012 11:41   Final    Culture     Final    Value:        BLOOD CULTURE RECEIVED NO GROWTH TO DATE CULTURE WILL BE HELD FOR 5 DAYS BEFORE ISSUING A FINAL NEGATIVE REPORT   Report Status PENDING   Incomplete   CULTURE, BLOOD (ROUTINE X 2)     Status: Normal (Preliminary result)   Collection Time   01/22/12  1:00 AM      Component Value Range Status Comment   Specimen Description BLOOD RIGHT HAND   Final    Special Requests BOTTLES DRAWN AEROBIC ONLY 8CC   Final    Culture  Setup Time 01/22/2012 11:41   Final    Culture     Final    Value:        BLOOD CULTURE RECEIVED NO GROWTH TO DATE CULTURE WILL BE HELD FOR 5 DAYS BEFORE ISSUING A FINAL NEGATIVE REPORT   Report Status PENDING   Incomplete   MRSA PCR SCREENING     Status: Normal   Collection Time   01/22/12  3:08 AM      Component Value Range Status Comment   MRSA by PCR NEGATIVE  NEGATIVE Final     CULTURE, RESPIRATORY     Status: Normal   Collection Time   01/24/12  9:56 AM      Component Value Range Status Comment   Specimen Description TRACHEAL ASPIRATE   Final    Special Requests NONE   Final    Gram Stain     Final    Value: FEW WBC PRESENT,BOTH PMN AND MONONUCLEAR     NO SQUAMOUS EPITHELIAL CELLS SEEN     NO ORGANISMS SEEN   Culture RARE CANDIDA ALBICANS   Final    Report Status 01/26/2012 FINAL   Final     Medical History: Past Medical History  Diagnosis Date  . Hypertension   . Anemia   . Diabetes mellitus   . Peripheral vascular disease   . Hemodialysis patient 03/15/11    "Tues; Thurs; Sat; [facility]"  . Blood transfusion   . Chronic back pain   . Rash 03/15/11    "admitted me to find out what its' from; UE, waist, thighs, groin"  . Renal  failure     hd 4/12    Medications:  Prescriptions prior to admission  Medication Sig Dispense Refill  . clonazePAM (KLONOPIN) 0.5 MG tablet Take 0.5 mg by mouth 3 (three) times a week. With dialysis      . diphenoxylate-atropine (LOMOTIL) 2.5-0.025 MG per tablet Take 1 tablet by mouth Three times daily as needed. For diarrhea      . gabapentin (NEURONTIN) 300 MG capsule Take 300 mg by mouth at bedtime.       Marland Kitchen HYDROcodone-acetaminophen (NORCO/VICODIN) 5-325 MG per tablet Take 1 tablet by mouth Every 6 hours as needed. For pain      . insulin aspart (NOVOLOG) 100 UNIT/ML injection Inject 8 Units into the skin daily.       . insulin glargine (LANTUS) 100 UNIT/ML injection Inject 12 Units into the skin at bedtime.       Marland Kitchen RENVELA 800 MG tablet Take 1,600 mg by mouth 3 (three) times daily with meals.       Scheduled:     . antiseptic oral rinse  15 mL Mouth Rinse QID  . chlorhexidine  15 mL Mouth Rinse BID  . darbepoetin (ARANESP) injection - DIALYSIS  60 mcg Intravenous Q Tue-HD  . imipenem-cilastatin  250 mg Intravenous Q12H  . insulin aspart  0-9 Units Subcutaneous Q4H  . sevelamer  1,600 mg  Oral TID WC    Assessment: 51yo female had HD in the am with hypotension, at home she said she didn't feel well and collapsed, family called EMS immediately but did not begin CPR, initial rhythm PEA, CPR/ACLS resulted in ROSC after ten minutes, unclear etiology of cardiac arrest, to begin IV ABX for possible sepsis.  Goal of Therapy:  Eradication of infection  Plan:  Continue primaxin  250mg  IV Q12H and monitor CBC, Cx, clinical progress  Woodfin Ganja PharmD  01/27/2012,8:42 AM

## 2012-01-27 NOTE — Progress Notes (Signed)
Report given to receiving nurse, Eunice Blase, at [facility]. [facility] was notified of transfer and will be here shortly. Pt's CBG was brought up to 108. Pt confused and oriented to self and knows who her husband is only. Pt's vital signs are WNL. Pt's husband is aware of transfer and will be notified when patient is leaving with [facility].

## 2012-01-27 NOTE — Procedures (Signed)
[facility]  On HD viaL upper arm AVF BP--129/66  Goal--3L.  Weighrt today 88.9 kg (EDW as outpatient 88 kg)--weight was up to 98.8 kg on 29 Sep  Hgb--pending   K+--pending Still on vanco and primaxin--all cultures neg so far

## 2012-01-27 NOTE — Discharge Summary (Addendum)
Physician Discharge Summary  Patient ID: [name] MRN: [id number] DOB/AGE: 1960-10-19 52 y.o.  Admit date: 01/21/2012 Discharge date: 01/27/2012  Problem List 1) Cardiac arrest (initial rhythm PEA) - etiology unclear  2) VDRF after cardiac arrest - resolved  3) Bilateral lung infiltrates/effusions, likely edema.  4) ESRD on HD via AV fistula  5) Suspected sepsis - likely osteomyelitis. Elevated PCT. Negative cx's  6) Lactic acidosis - resolved  7) Shock - resolved.  8) H/O DM2, transient hypoglycemia 9/30 - resolved  9) anemia  10) mild thrombocytopenia, improving  11) suspected R hand osteomyelitis  12) Post cardiac arrest encephalopathy. Improving    HPI: 51 years old female with PMH of ESRD, IDDM, HTN, PVD, L BKA, recent amputation of left middle finger about two weeks PTA. Suffered PEA arrest 9/28 with approx 10 mins pulselessness. Did not undergo hypothermia protocol. Treated for presumed sepsis based on elevated PCT and hx of osteomyelitis  Hospital Course:  Studies/Events:  9/29 CT head: no acute intracranial processes  9/29 Echo: LVEF 60-65%. RVSP est 50 mmHg  9/29 L hand Xray: Status post amputation of the long finger with cortical irregularity at the amputation site worrisome for osteomyelitis.  9/29 R foot Xray: No acute finding.  10/03 Failed SLP swallow eval   Lines, Tubes, etc:  ETT 9/28 >> 10/01  R IJ CVL 9/29 >>  R radial A-line 9/29 >> 10/02   Microbiology:  Procalcitonin: 9/29 >> 7.94, 9//30 >> 21.05, 10/02 >> 14.86, 10/03 >> 11.85  MRSA PCR 9/28 >> NEG  Blood 9/29 >> NEG resp 10/01 >> rare Candida  Antibiotics:  Vanc 9/29 >> 10/02  Imipenem 9/29 >>   Consults:  Renal   Physical Examination:  General: NAD  Neuro: MAEs, + F/C, RASS 0  HEENT: PERRL, pink conjunctivae, moist membranes  Neck: Supple, no JVD  Cardiovascular: RRR, no M/R/G  Lungs: Clear anteriorly  Abdomen: Soft, nontender, nondistended, bowel sounds present    Musculoskeletal: Left BKA. Left hand dressed -sutures of left middle digital amputn surgery. AV fistula on left arm. No pedal edema  Skin: Diffuse scratches on RLE (by cat per pt's husband    Labs at discharge Lab Results  Component Value Date   CREATININE 4.20* 01/27/2012   BUN 28* 01/27/2012   NA 139 01/27/2012   K 3.8 01/27/2012   CL 100 01/27/2012   CO2 25 01/27/2012   Lab Results  Component Value Date   WBC 6.1 01/27/2012   HGB 10.6* 01/27/2012   HCT 34.5* 01/27/2012   MCV 93.0 01/27/2012   PLT 136* 01/27/2012   Lab Results  Component Value Date   ALT 6 01/26/2012   AST 24 01/26/2012   ALKPHOS 217* 01/26/2012   BILITOT 0.7 01/26/2012   Lab Results  Component Value Date   INR 1.09 03/15/2011   INR 1.13 03/15/2011   INR 1.05 03/05/2011    Current radiology studies Dg Chest Port 1 View  01/26/2012  *RADIOLOGY REPORT*  Clinical Data: Pulmonary edema  PORTABLE CHEST - 1 VIEW  Comparison: 01/25/2012  Findings: There is a right IJ catheter with tip in the cavoatrial junction.  Heart size appears mildly enlarged.  There is been interval increase and interstitial edema.  Lung volumes remain low.  IMPRESSION:  1.  Worsening interstitial edema pattern. 2.  Low lung volumes.   Original Report Authenticated By: Rosealee Albee, M.D.     Disposition:  01-Home or Self Care  Discharge Orders  Future Orders Please Complete By Expires   Discharge to SNF when bed available          Medication List     As of 01/27/2012  1:53 PM    STOP taking these medications         clonazePAM 0.5 MG tablet   Commonly known as: KLONOPIN      diphenoxylate-atropine 2.5-0.025 MG per tablet   Commonly known as: LOMOTIL      gabapentin 300 MG capsule   Commonly known as: NEURONTIN      HYDROcodone-acetaminophen 5-325 MG per tablet   Commonly known as: NORCO/VICODIN      insulin glargine 100 UNIT/ML injection   Commonly known as: LANTUS      TAKE these medications         darbepoetin 60  MCG/0.3ML Soln   Commonly known as: ARANESP   Inject 0.3 mLs (60 mcg total) into the vein every Tuesday with hemodialysis.      heparin 1000 unit/mL Soln injection   1.8 mLs (1,800 Units total) by Dialysis route as needed (in dialysis).      insulin aspart 100 UNIT/ML injection   Commonly known as: novoLOG   Inject 0-9 Units into the skin every 4 (four) hours.      RENVELA 800 MG tablet   Generic drug: sevelamer   Take 1,600 mg by mouth 3 (three) times daily with meals.      sodium chloride 0.9 % infusion   Inject 250 mLs into the vein as needed (if IV carrier fluid needed.).      sodium chloride 0.9 % SOLN 100 mL with imipenem-cilastatin 250 MG SOLR 250 mg   Inject 250 mg into the vein every 8 (eight) hours.         Discharged Condition:fair Vital signs at Discharge. Temp:  [96.7 F (35.9 C)-97.8 F (36.6 C)] 96.7 F (35.9 C) (10/04 0815) Pulse Rate:  [72-94] 89  (10/04 1300) Resp:  [13-23] 18  (10/04 1300) BP: (102-156)/(43-97) 127/48 mmHg (10/04 1300) SpO2:  [100 %] 100 % (10/04 0856) Weight:  [87.7 kg (193 lb 5.5 oz)-88.9 kg (195 lb 15.8 oz)] 88.9 kg (195 lb 15.8 oz) (10/04 0815) Office follow up Special Information or instructions. Per [facility] Signed: Brett Canales Minor ACNP Adolph Pollack PCCM Pager [phone] till 3 pm If no answer page [phone] 01/27/2012, 1:53 PM     Agree with above. The exact source of sepsis is likely osteomyelitis. She should remain on abx for this for 3-4 wks. Would probably be OK to narrow to Unasyn or Augmentin when she is able to take POs   Merwyn Katos, MD

## 2012-01-27 NOTE — Progress Notes (Signed)
Hypoglycemic Event  CBG: 62  Treatment: D50 IV 25 mL  Symptoms: None  Follow-up CBG: Time 16:46 CBG Result:108  Possible Reasons for Event: Inadequate meal intake  Comments/MD notified: Dr. Sung Amabile notified.     [name] D  Remember to initiate Hypoglycemia Order Set & complete

## 2012-01-28 LAB — CULTURE, BLOOD (ROUTINE X 2)

## 2012-02-25 ENCOUNTER — Encounter (HOSPITAL_COMMUNITY): Payer: Self-pay | Admitting: *Deleted

## 2012-02-25 ENCOUNTER — Emergency Department (HOSPITAL_COMMUNITY)
Admission: EM | Admit: 2012-02-25 | Discharge: 2012-02-25 | Disposition: A | Discharge status: Home / Self Care | Payer: Medicare Other | Attending: Emergency Medicine | Admitting: Emergency Medicine

## 2012-02-25 ENCOUNTER — Emergency Department (HOSPITAL_COMMUNITY): Payer: Medicare Other

## 2012-02-25 DIAGNOSIS — G8929 Other chronic pain: Secondary | ICD-10-CM | POA: Insufficient documentation

## 2012-02-25 DIAGNOSIS — E119 Type 2 diabetes mellitus without complications: Secondary | ICD-10-CM | POA: Insufficient documentation

## 2012-02-25 DIAGNOSIS — Z992 Dependence on renal dialysis: Secondary | ICD-10-CM | POA: Insufficient documentation

## 2012-02-25 DIAGNOSIS — N19 Unspecified kidney failure: Secondary | ICD-10-CM | POA: Insufficient documentation

## 2012-02-25 DIAGNOSIS — M549 Dorsalgia, unspecified: Secondary | ICD-10-CM | POA: Insufficient documentation

## 2012-02-25 DIAGNOSIS — Z452 Encounter for adjustment and management of vascular access device: Secondary | ICD-10-CM | POA: Insufficient documentation

## 2012-02-25 DIAGNOSIS — Z794 Long term (current) use of insulin: Secondary | ICD-10-CM | POA: Insufficient documentation

## 2012-02-25 DIAGNOSIS — Z79899 Other long term (current) drug therapy: Secondary | ICD-10-CM | POA: Insufficient documentation

## 2012-02-25 DIAGNOSIS — I129 Hypertensive chronic kidney disease with stage 1 through stage 4 chronic kidney disease, or unspecified chronic kidney disease: Secondary | ICD-10-CM | POA: Insufficient documentation

## 2012-02-25 DIAGNOSIS — I739 Peripheral vascular disease, unspecified: Secondary | ICD-10-CM | POA: Insufficient documentation

## 2012-02-25 DIAGNOSIS — Z862 Personal history of diseases of the blood and blood-forming organs and certain disorders involving the immune mechanism: Secondary | ICD-10-CM | POA: Insufficient documentation

## 2012-02-25 LAB — COMPREHENSIVE METABOLIC PANEL
ALT: 9 U/L (ref 0–35)
Alkaline Phosphatase: 424 U/L — ABNORMAL HIGH (ref 39–117)
BUN: 23 mg/dL (ref 6–23)
Chloride: 96 mEq/L (ref 96–112)
GFR calc Af Amer: 13 mL/min — ABNORMAL LOW (ref >90)
Glucose, Bld: 84 mg/dL (ref 70–99)
Potassium: 4.2 mEq/L (ref 3.5–5.1)
Sodium: 139 mEq/L (ref 135–145)
Total Bilirubin: 0.5 mg/dL (ref 0.3–1.2)

## 2012-02-25 LAB — CBC WITH DIFFERENTIAL/PLATELET
Hemoglobin: 11 g/dL — ABNORMAL LOW (ref 12.0–15.0)
Lymphocytes Relative: 22 % (ref 12–46)
Lymphs Abs: 1.9 10*3/uL (ref 0.7–4.0)
Monocytes Relative: 8 % (ref 3–12)
Neutro Abs: 4.9 10*3/uL (ref 1.7–7.7)
Neutrophils Relative %: 58 % (ref 43–77)
RBC: 3.76 MIL/uL — ABNORMAL LOW (ref 3.87–5.11)
WBC: 8.6 10*3/uL (ref 4.0–10.5)

## 2012-02-25 LAB — URINE MICROSCOPIC-ADD ON

## 2012-02-25 LAB — URINALYSIS, ROUTINE W REFLEX MICROSCOPIC
Hgb urine dipstick: NEGATIVE
Ketones, ur: NEGATIVE mg/dL
Protein, ur: >300 mg/dL — AB
Urobilinogen, UA: 0.2 mg/dL (ref 0.0–1.0)

## 2012-02-25 LAB — PROTIME-INR: Prothrombin Time: 14.7 seconds (ref 11.6–15.2)

## 2012-02-25 NOTE — ED Notes (Signed)
Patient transported to X-ray 

## 2012-02-25 NOTE — ED Notes (Signed)
Spoke with IV team.  They states they will not be able to place a PICC line tonight.

## 2012-02-25 NOTE — ED Notes (Signed)
[facility] called for transport.  

## 2012-02-25 NOTE — ED Notes (Signed)
Per report pt was at [facility] care for rehab.  She was a post arrest that was in rehab.  She is now being sent into the ER by the rehab facility for a PICC line placement.

## 2012-02-25 NOTE — ED Provider Notes (Addendum)
History     CSN: [id number]  Arrival date & time 02/25/12  1758   First MD Initiated Contact with Patient 02/25/12 1833      Chief Complaint  Patient presents with  . Vascular Access Problem    (Consider location/radiation/quality/duration/timing/severity/associated sxs/prior treatment) HPI Comments: Patient presents from nursing home with request for PICC line for IV antibiotics for osteomyelitis of the hand. EMS was able to place an IV at the facility wanted her transported anyway. Patient has a history of PEA arrest one month ago thought to be secondary to osteomyelitis of her hand. She is at her neurological baseline per facility and per the nurse who has seen this patient before. She denies any pain. She is oriented to self and place. No provoking or palliating factors. No pain or radiation.  The history is provided by the EMS personnel and the patient.    Past Medical History  Diagnosis Date  . Hypertension   . Anemia   . Diabetes mellitus   . Peripheral vascular disease   . Hemodialysis patient 03/15/11    "Tues; Thurs; Sat; [facility]"  . Blood transfusion   . Chronic back pain   . Rash 03/15/11    "admitted me to find out what its' from; UE, waist, thighs, groin"  . Renal failure     hd 4/12    Past Surgical History  Procedure Date  . Insertion of dialysis catheter 07/29/10    right subclavian  . Arteriovenous graft placement 10/01/10    left upper arm  . Below knee leg amputation 2004    left  . Amputation 03/04/2011    Procedure: AMPUTATION DIGIT;  Surgeon: [name], MD;  Location: [facility] OR;  Service: Orthopedics;  Laterality: Right;  RT GREAT TOE AMP  . Cataract extraction w/ intraocular lens implant 12/2009    right eye  . Carpal tunnel release ~ 2009    left wrist  . Tee without cardioversion 03/18/2011    Procedure: TRANSESOPHAGEAL ECHOCARDIOGRAM (TEE);  Surgeon: [name], MD;  Location: [facility] ENDOSCOPY;  Service: Cardiovascular;   Laterality: N/A;    History reviewed. No pertinent family history.  History  Substance Use Topics  . Smoking status: Never Smoker   . Smokeless tobacco: Never Used  . Alcohol Use: No    OB History    Grav Para Term Preterm Abortions TAB SAB Ect Mult Living                  Review of Systems  Constitutional: Negative for fever, activity change and appetite change.  Respiratory: Negative for cough and chest tightness.   Cardiovascular: Negative for chest pain.  Gastrointestinal: Negative for nausea, vomiting and abdominal pain.  Musculoskeletal: Negative for back pain.  Skin: Positive for wound.  Neurological: Negative for headaches.  A complete 10 system review of systems was obtained and all systems are negative except as noted in the HPI and PMH.    Allergies  Codeine; Levofloxacin; and Penicillins  Home Medications   Current Outpatient Rx  Name Route Sig Dispense Refill  . INVANZ IV Intravenous Inject 500 mg into the vein daily.    . GUAIFENESIN-DM 100-10 MG/5ML PO SYRP Oral Take 30 mLs by mouth 4 (four) times daily as needed. For congestion    . HYDROCODONE-ACETAMINOPHEN 5-325 MG PO TABS Oral Take 1 tablet by mouth every 4 (four) hours as needed. For pain    . INSULIN ASPART 100 UNIT/ML [place] SOLN Subcutaneous  Inject 0-10 Units into the skin 4 (four) times daily -  before meals and at bedtime. Sliding scale >151-200=2 units; 201-250=4 units; 251-300=6 units; 301-350=8units; 351-400=10units; >400 notify MD    . IPRATROPIUM-ALBUTEROL 0.5-2.5 (3) MG/3ML IN SOLN Nebulization Take 3 mLs by nebulization every 6 (six) hours as needed. For shortness of breath    . LABETALOL HCL 100 MG PO TABS Oral Take 50 mg by mouth 2 (two) times daily.    Marland Kitchen METOCLOPRAMIDE HCL 5 MG PO TABS Oral Take 5 mg by mouth 4 (four) times daily.    Marland Kitchen METOPROLOL TARTRATE 25 MG PO TABS Oral Take 12.5 mg by mouth 2 (two) times daily.    Marland Kitchen ONDANSETRON HCL 4 MG PO TABS Oral Take 4 mg by mouth every 6 (six) hours  as needed. For nausea    . PANTOPRAZOLE SODIUM 40 MG PO TBEC Oral Take 40 mg by mouth 2 (two) times daily.    . SERTRALINE HCL 100 MG PO TABS Oral Take 100 mg by mouth daily.      BP 152/64  Pulse 80  Temp 96.9 F (36.1 C) (Rectal)  Resp 16  SpO2 100%  Physical Exam  Constitutional: She is oriented to person, place, and time. She appears well-developed and well-nourished. No distress.  HENT:  Head: Normocephalic and atraumatic.  Mouth/Throat: Oropharynx is clear and moist. No oropharyngeal exudate.  Eyes: Conjunctivae normal are normal. Pupils are equal, round, and reactive to light.  Neck: Normal range of motion. Neck supple.  Cardiovascular: Normal rate, regular rhythm and normal heart sounds.   No murmur heard. Pulmonary/Chest: Effort normal and breath sounds normal. No respiratory distress.  Abdominal: Soft. There is no tenderness. There is no rebound and no guarding.  Musculoskeletal: Normal range of motion. She exhibits no edema and no tenderness.       S/p L BKA S/p multiple finger amputations. L middle finger amputation site with scabbed abrasion. No abscess or cellulitis. LUE fistula with bruit and thrill  Neurological: She is alert and oriented to person, place, and time. No cranial nerve deficit.  Skin: Skin is warm.    ED Course  Procedures (including critical care time)  Labs Reviewed  CBC WITH DIFFERENTIAL - Abnormal; Notable for the following:    RBC 3.76 (*)     Hemoglobin 11.0 (*)     HCT 34.7 (*)     Eosinophils Relative 11 (*)     Eosinophils Absolute 1.0 (*)     All other components within normal limits  COMPREHENSIVE METABOLIC PANEL - Abnormal; Notable for the following:    Creatinine, Ser 4.39 (*)     Albumin 3.4 (*)     AST 44 (*)     Alkaline Phosphatase 424 (*)     GFR calc non Af Amer 11 (*)     GFR calc Af Amer 13 (*)     All other components within normal limits  URINALYSIS, ROUTINE W REFLEX MICROSCOPIC - Abnormal; Notable for the  following:    Color, Urine AMBER (*)  BIOCHEMICALS MAY BE AFFECTED BY COLOR   APPearance TURBID (*)     Glucose, UA 100 (*)     Bilirubin Urine SMALL (*)     Protein, ur >300 (*)     All other components within normal limits  PROTIME-INR  URINE MICROSCOPIC-ADD ON  CULTURE, BLOOD (ROUTINE X 2)  CULTURE, BLOOD (ROUTINE X 2)   Dg Chest 2 View  02/25/2012  *RADIOLOGY REPORT*  Clinical Data: Confusion and weakness  CHEST - 2 VIEW  Comparison: February 20, 2012  Findings:  The left lung is now clear.  Patchy pneumonitis remains in the right base, stable.  There is no new opacity.  Heart size and pulmonary vascularity are normal.  No adenopathy.  No bone lesions.  IMPRESSION: Persistent patchy infiltrate right base.  Left lung now clear.  No new opacity.   Original Report Authenticated By: Bretta Bang, M.D.      1. Encounter for adjustment or management of vascular access device       MDM  Sent from nursing home for requested PICC line placement. Patient receiving IV Invanz for osteomyelitis of the hand. She denies complaints and has no fevers or chills.  PICC line not available overnight.  IV access established by EMS.  According to patient's discharge summary on October 4 she was in 3-4 weeks of IV antibiotics for osteomyelitis. It should be completed by this point. She notes also state that she could be switched to Augmentin when she was able to tolerate by mouth. Is not clear she even needs a PICC line.  She does have IV access at the request of the nursing home and they're comfortable taking her back. Patient's husband confirms she is at her mental baseline he has no concerns.     Glynn Octave, MD 02/25/12 1478  Glynn Octave, MD 05/29/12 1540

## 2012-03-03 LAB — CULTURE, BLOOD (ROUTINE X 2): Culture: NO GROWTH

## 2012-03-12 ENCOUNTER — Other Ambulatory Visit (HOSPITAL_COMMUNITY): Payer: Self-pay | Admitting: Internal Medicine

## 2012-03-12 DIAGNOSIS — B999 Unspecified infectious disease: Secondary | ICD-10-CM

## 2012-03-13 ENCOUNTER — Ambulatory Visit (HOSPITAL_COMMUNITY)
Admission: RE | Admit: 2012-03-13 | Discharge: 2012-03-13 | Disposition: A | Discharge status: Home / Self Care | Payer: Medicare Other | Source: Ambulatory Visit | Attending: Internal Medicine | Admitting: Internal Medicine

## 2012-03-13 ENCOUNTER — Other Ambulatory Visit (HOSPITAL_COMMUNITY): Payer: Self-pay | Admitting: Internal Medicine

## 2012-03-13 DIAGNOSIS — B999 Unspecified infectious disease: Secondary | ICD-10-CM

## 2012-03-13 DIAGNOSIS — N186 End stage renal disease: Secondary | ICD-10-CM | POA: Insufficient documentation

## 2012-03-13 DIAGNOSIS — M869 Osteomyelitis, unspecified: Secondary | ICD-10-CM | POA: Insufficient documentation

## 2012-03-13 LAB — GLUCOSE, CAPILLARY: Glucose-Capillary: 90 mg/dL (ref 70–99)

## 2012-03-13 NOTE — Procedures (Signed)
Successful fluoroscopic guided placement of tunneled right internal jugular approach PICC line with tip terminating at the superior caval atrial junction.  Catheter is ready for immediate use.  No immediate post procedural complications.

## 2012-03-13 NOTE — Progress Notes (Signed)
Dr. Grace Isaac in to see patient. VS stable. He feels she is OK to go to the nursing home with SCAT.

## 2012-03-19 ENCOUNTER — Emergency Department (HOSPITAL_COMMUNITY): Payer: Medicare Other

## 2012-03-19 ENCOUNTER — Observation Stay (HOSPITAL_COMMUNITY)
Admission: EM | Admit: 2012-03-19 | Discharge: 2012-03-21 | Disposition: A | Discharge status: Skilled Nursing Facility | Payer: Medicare Other | Attending: Family Medicine | Admitting: Family Medicine

## 2012-03-19 DIAGNOSIS — R6521 Severe sepsis with septic shock: Secondary | ICD-10-CM

## 2012-03-19 DIAGNOSIS — D649 Anemia, unspecified: Secondary | ICD-10-CM

## 2012-03-19 DIAGNOSIS — J9690 Respiratory failure, unspecified, unspecified whether with hypoxia or hypercapnia: Secondary | ICD-10-CM

## 2012-03-19 DIAGNOSIS — G40909 Epilepsy, unspecified, not intractable, without status epilepticus: Secondary | ICD-10-CM | POA: Insufficient documentation

## 2012-03-19 DIAGNOSIS — A419 Sepsis, unspecified organism: Secondary | ICD-10-CM

## 2012-03-19 DIAGNOSIS — N2581 Secondary hyperparathyroidism of renal origin: Secondary | ICD-10-CM

## 2012-03-19 DIAGNOSIS — N186 End stage renal disease: Secondary | ICD-10-CM

## 2012-03-19 DIAGNOSIS — I10 Essential (primary) hypertension: Secondary | ICD-10-CM

## 2012-03-19 DIAGNOSIS — G9340 Encephalopathy, unspecified: Secondary | ICD-10-CM

## 2012-03-19 DIAGNOSIS — M9080 Osteopathy in diseases classified elsewhere, unspecified site: Secondary | ICD-10-CM | POA: Insufficient documentation

## 2012-03-19 DIAGNOSIS — Z992 Dependence on renal dialysis: Secondary | ICD-10-CM | POA: Insufficient documentation

## 2012-03-19 DIAGNOSIS — E1169 Type 2 diabetes mellitus with other specified complication: Secondary | ICD-10-CM | POA: Insufficient documentation

## 2012-03-19 DIAGNOSIS — M869 Osteomyelitis, unspecified: Secondary | ICD-10-CM

## 2012-03-19 DIAGNOSIS — D631 Anemia in chronic kidney disease: Secondary | ICD-10-CM | POA: Insufficient documentation

## 2012-03-19 DIAGNOSIS — A0472 Enterocolitis due to Clostridium difficile, not specified as recurrent: Secondary | ICD-10-CM | POA: Insufficient documentation

## 2012-03-19 DIAGNOSIS — M549 Dorsalgia, unspecified: Secondary | ICD-10-CM | POA: Insufficient documentation

## 2012-03-19 DIAGNOSIS — R4182 Altered mental status, unspecified: Secondary | ICD-10-CM | POA: Diagnosis present

## 2012-03-19 DIAGNOSIS — I953 Hypotension of hemodialysis: Secondary | ICD-10-CM

## 2012-03-19 DIAGNOSIS — G8929 Other chronic pain: Secondary | ICD-10-CM | POA: Insufficient documentation

## 2012-03-19 DIAGNOSIS — I120 Hypertensive chronic kidney disease with stage 5 chronic kidney disease or end stage renal disease: Secondary | ICD-10-CM | POA: Insufficient documentation

## 2012-03-19 DIAGNOSIS — R6520 Severe sepsis without septic shock: Secondary | ICD-10-CM

## 2012-03-19 DIAGNOSIS — L89309 Pressure ulcer of unspecified buttock, unspecified stage: Secondary | ICD-10-CM | POA: Insufficient documentation

## 2012-03-19 DIAGNOSIS — L8990 Pressure ulcer of unspecified site, unspecified stage: Secondary | ICD-10-CM | POA: Insufficient documentation

## 2012-03-19 DIAGNOSIS — D696 Thrombocytopenia, unspecified: Secondary | ICD-10-CM

## 2012-03-19 DIAGNOSIS — R55 Syncope and collapse: Principal | ICD-10-CM

## 2012-03-19 DIAGNOSIS — S88119A Complete traumatic amputation at level between knee and ankle, unspecified lower leg, initial encounter: Secondary | ICD-10-CM | POA: Insufficient documentation

## 2012-03-19 DIAGNOSIS — R569 Unspecified convulsions: Secondary | ICD-10-CM

## 2012-03-19 LAB — URINALYSIS, ROUTINE W REFLEX MICROSCOPIC
Glucose, UA: NEGATIVE mg/dL
Protein, ur: >300 mg/dL — AB
Specific Gravity, Urine: 1.018 (ref 1.005–1.030)
Urobilinogen, UA: 0.2 mg/dL (ref 0.0–1.0)

## 2012-03-19 LAB — URINE MICROSCOPIC-ADD ON

## 2012-03-19 LAB — HEPATIC FUNCTION PANEL
AST: 32 U/L (ref 0–37)
Bilirubin, Direct: 0.2 mg/dL (ref 0.0–0.3)
Indirect Bilirubin: 0.2 mg/dL — ABNORMAL LOW (ref 0.3–0.9)
Total Bilirubin: 0.4 mg/dL (ref 0.3–1.2)

## 2012-03-19 LAB — CBC WITH DIFFERENTIAL/PLATELET
Eosinophils Absolute: 0.4 10*3/uL (ref 0.0–0.7)
Hemoglobin: 14 g/dL (ref 12.0–15.0)
Lymphs Abs: 1.1 10*3/uL (ref 0.7–4.0)
MCH: 29.7 pg (ref 26.0–34.0)
Monocytes Relative: 8 % (ref 3–12)
Neutro Abs: 4.1 10*3/uL (ref 1.7–7.7)
Neutrophils Relative %: 67 % (ref 43–77)
Platelets: 107 10*3/uL — ABNORMAL LOW (ref 150–400)
RBC: 4.72 MIL/uL (ref 3.87–5.11)
WBC: 6.2 10*3/uL (ref 4.0–10.5)

## 2012-03-19 LAB — BASIC METABOLIC PANEL
Calcium: 9.9 mg/dL (ref 8.4–10.5)
GFR calc Af Amer: 7 mL/min — ABNORMAL LOW (ref >90)
GFR calc non Af Amer: 6 mL/min — ABNORMAL LOW (ref >90)
Glucose, Bld: 104 mg/dL — ABNORMAL HIGH (ref 70–99)
Potassium: 5 mEq/L (ref 3.5–5.1)
Sodium: 138 mEq/L (ref 135–145)

## 2012-03-19 LAB — CK: Total CK: 46 U/L (ref 7–177)

## 2012-03-19 LAB — TROPONIN I: Troponin I: <0.3 ng/mL (ref <0.30)

## 2012-03-19 LAB — GLUCOSE, CAPILLARY: Glucose-Capillary: 108 mg/dL — ABNORMAL HIGH (ref 70–99)

## 2012-03-19 NOTE — ED Notes (Signed)
Consulting MD at bedside

## 2012-03-19 NOTE — ED Provider Notes (Signed)
History     CSN: [id number]  Arrival date & time 03/19/12  1438   First MD Initiated Contact with Patient 03/19/12 1505      Chief Complaint  Patient presents with  . Seizures    (Consider location/radiation/quality/duration/timing/severity/associated sxs/prior treatment) HPI Comments: [name] is a 51 y.o. Female who reportedly had a seizure while on dialysis today. She had been on the machine only an hour and 20 minutes, when she had a seizure. It is reported that she had a generalized tonic-clonic seizure. Duration was 1-2 minutes. Apparently, there was no postictal state. She did not receive any treatment. She has never previously had a seizure. The patient states that she is at her baseline now. She had no prodrome and no preceding illness leading up to this problem. She lives in a rest home. She has been using her medications as usual. She's in the ED, with a family member.  Patient is a 51 y.o. female presenting with seizures. The history is provided by the patient.  Seizures     Past Medical History  Diagnosis Date  . Hypertension   . Anemia   . Diabetes mellitus   . Peripheral vascular disease   . Hemodialysis patient 03/15/11    "Tues; Thurs; Sat; [facility]"  . Blood transfusion   . Chronic back pain   . Rash 03/15/11    "admitted me to find out what its' from; UE, waist, thighs, groin"  . Renal failure     hd 4/12    Past Surgical History  Procedure Date  . Insertion of dialysis catheter 07/29/10    right subclavian  . Arteriovenous graft placement 10/01/10    left upper arm  . Below knee leg amputation 2004    left  . Amputation 03/04/2011    Procedure: AMPUTATION DIGIT;  Surgeon: [name], MD;  Location: [facility] OR;  Service: Orthopedics;  Laterality: Right;  RT GREAT TOE AMP  . Cataract extraction w/ intraocular lens implant 12/2009    right eye  . Carpal tunnel release ~ 2009    left wrist  . Tee without cardioversion  03/18/2011    Procedure: TRANSESOPHAGEAL ECHOCARDIOGRAM (TEE);  Surgeon: [name], MD;  Location: [facility] ENDOSCOPY;  Service: Cardiovascular;  Laterality: N/A;    No family history on file.  History  Substance Use Topics  . Smoking status: Never Smoker   . Smokeless tobacco: Never Used  . Alcohol Use: No    OB History    Grav Para Term Preterm Abortions TAB SAB Ect Mult Living                  Review of Systems  Neurological: Positive for seizures.  All other systems reviewed and are negative.    Allergies  Codeine; Levofloxacin; and Penicillins  Home Medications   Current Outpatient Rx  Name  Route  Sig  Dispense  Refill  . INVANZ IV   Intravenous   Inject 500 mg into the vein daily.         . GUAIFENESIN-DM 100-10 MG/5ML PO SYRP   Oral   Take 30 mLs by mouth 4 (four) times daily as needed. For congestion         . HYDROCODONE-ACETAMINOPHEN 5-325 MG PO TABS   Oral   Take 1 tablet by mouth every 4 (four) hours as needed. For pain         . INSULIN ASPART 100 UNIT/ML [place] SOLN  Subcutaneous   Inject 0-10 Units into the skin 4 (four) times daily -  before meals and at bedtime. Sliding scale >151-200=2 units; 201-250=4 units; 251-300=6 units; 301-350=8units; 351-400=10units; >400 notify MD         . IPRATROPIUM-ALBUTEROL 0.5-2.5 (3) MG/3ML IN SOLN   Nebulization   Take 3 mLs by nebulization every 6 (six) hours as needed. For shortness of breath         . LABETALOL HCL 100 MG PO TABS   Oral   Take 50 mg by mouth 2 (two) times daily.         Marland Kitchen METOCLOPRAMIDE HCL 5 MG PO TABS   Oral   Take 5 mg by mouth 4 (four) times daily.         Marland Kitchen METOPROLOL TARTRATE 25 MG PO TABS   Oral   Take 12.5 mg by mouth 2 (two) times daily.         Marland Kitchen ONDANSETRON HCL 4 MG PO TABS   Oral   Take 4 mg by mouth every 6 (six) hours as needed. For nausea         . PANTOPRAZOLE SODIUM 40 MG PO TBEC   Oral   Take 40 mg by mouth 2 (two) times daily.         .  SERTRALINE HCL 100 MG PO TABS   Oral   Take 100 mg by mouth daily.           BP 116/69  Pulse 74  Resp 16  SpO2 99%  Physical Exam  Nursing note and vitals reviewed. Constitutional: She is oriented to person, place, and time. She appears well-developed.       Appears older than stated age. She is unable to sit in a stretcher, for what she states is, chronic, weakness.  HENT:  Head: Normocephalic and atraumatic.       No tongue or lip injury  Eyes: Conjunctivae normal and EOM are normal. Pupils are equal, round, and reactive to light.  Neck: Normal range of motion and phonation normal. Neck supple.  Cardiovascular: Normal rate, regular rhythm and intact distal pulses.        Left arm fistula, with good thrill  Pulmonary/Chest: Effort normal and breath sounds normal. She exhibits no tenderness.  Abdominal: Soft. She exhibits no distension. There is no tenderness. There is no guarding.  Musculoskeletal: Normal range of motion.       Left leg, BKA, with prosthetic device.  Neurological: She is alert and oriented to person, place, and time. She has normal strength. No cranial nerve deficit. She exhibits normal muscle tone.  Skin: Skin is warm and dry.  Psychiatric: Her behavior is normal.       She appears depressed    ED Course  Procedures (including critical care time)     Review of prior records: Patient had sudden cardiac death in 2012-02-02; she had return of spontaneous circulation and was treated with hypothermic protocol. No cause for cardiac arrest was found.     We'll contact nephrology, for guidance regarding the partial dialysis.  We'll arrange admission, likely, to a PCP service.     Date: 02/10/2012  Rate: 74  Rhythm: normal sinus rhythm  QRS Axis: normal  PR and QT Intervals: normal  ST/T Wave abnormalities: nonspecific ST changes  PR and QRS Conduction Disutrbances:none  Narrative Interpretation:   Old EKG Reviewed: changes noted- prior  tracing, was atrial fibrillation, with a slower rate.   Labs  Reviewed  BASIC METABOLIC PANEL - Abnormal; Notable for the following:    Glucose, Bld 104 (*)     BUN 30 (*)     Creatinine, Ser 7.46 (*)     GFR calc non Af Amer 6 (*)     GFR calc Af Amer 7 (*)     All other components within normal limits  CBC WITH DIFFERENTIAL - Abnormal; Notable for the following:    Platelets 107 (*)  PLATELET COUNT CONFIRMED BY SMEAR   Eosinophils Relative 6 (*)     All other components within normal limits  GLUCOSE, CAPILLARY - Abnormal; Notable for the following:    Glucose-Capillary 108 (*)     All other components within normal limits  URINALYSIS, ROUTINE W REFLEX MICROSCOPIC   Dg Chest 2 View  03/19/2012  *RADIOLOGY REPORT*  Clinical Data: Seizures.  CHEST - 2 VIEW  Comparison: February 25, 2012.  Findings: Cardiomediastinal silhouette appears normal.  No acute pulmonary disease is noted.  Right internal jugular catheter tip is noted in expected position of cavoatrial junction.  Bony thorax is intact.  IMPRESSION: No acute cardiopulmonary abnormality seen.   Original Report Authenticated By: Lupita Raider.,  M.D.    Ct Head Wo Contrast  03/19/2012  *RADIOLOGY REPORT*  Clinical Data: Seizure.  CT HEAD WITHOUT CONTRAST  Technique:  Contiguous axial images were obtained from the base of the skull through the vertex without contrast.  Comparison: CT head without contrast 01/22/2012.  Findings: Mild generalized atrophy is present.  Periventricular white matter hypoattenuation is similar to the prior study.  No acute cortical infarct, hemorrhage, or mass lesion is present. The ventricles are proportionate to the degree of atrophy.  No significant extra-axial fluid collection is present.  The paranasal sinuses are clear.  There is some fluid in the mastoid air cells bilaterally, worse on the left.  No obstructing nasopharyngeal lesion is evident.  Atherosclerotic calcifications are again noted within the  cavernous carotid arteries bilaterally.  IMPRESSION:  1.  Stable atrophy and white matter disease. 2.  No acute intracranial abnormality. 3.  Interval clearing of left sphenoid sinus disease. 4.  New bilateral mastoid effusions.   Original Report Authenticated By: Marin Roberts, M.D.      1. Syncope   2. End stage renal disease       MDM  Reported seizure activity, without clear findings for seizure. I've significant concern for cardiac etiology secondary to her recent sudden cardiac death. She was partially dialyzed today. She is no history of seizure disorder. No findings and evaluation today, that are worrisome for a etiology for seizures.        Flint Melter, MD 03/19/12 [id number]

## 2012-03-19 NOTE — ED Notes (Signed)
Per EMS pt was having diaylsis and had a tonic clonic seizure 1-2 in. Pt denies hx of seizures. 1 h of 4 hours dialysis tx. Pt. Baseline is is oriented to person and situation- pt is currently at baseline staff states no post-ictal phase. Vital signs WNL

## 2012-03-19 NOTE — ED Notes (Signed)
Husband asked to notify pt.s facility. RN explained care handoff at discharge. Pt. Husband given phone to call facility.

## 2012-03-19 NOTE — ED Notes (Signed)
Pt brought into room from hallway; pt undressed, in gown, on monitor, continuous pulse oximetry and blood pressure cuff; sheet placed on pt

## 2012-03-19 NOTE — ED Notes (Signed)
Pt. Placed on bedpan to obtain urine specimen pt. Unable to void.

## 2012-03-19 NOTE — H&P (Signed)
[name] is an 51 y.o. female.     Family Medicine Teaching Service PGY-1 H&P Chief Complaint: Altered mental status with possible seizure activity HPI:  Per report, patient was at dialysis today for her ESRD and a possible seizure activity. Although it was reportedly un-wittnessed, it was said to be "tonic-clonic" with "no post-ictal state." The patient does not recall the event. The last moment she remembers she was sitting in the chair receiving dialysis and then waking up in the ambulance. She does not recall soiling herself. She seems to have some baseline metal status deficits, possibly from her PEA in September. Difficult to assess with no family or representative present. EDP with no additional information available except per his note. Attempted to reach spouse at home and cell and no response. She has multiple amputations and wounds to her hands bilaterally and toes of right foot. Her left leg is amputated below the knee. She is not certain when or how she had her fingers amputated. She is unable to tell us where she receives dialysis. She was only able to receive 1 hour and twenty minutes of dialysis due to this seizure-like activity, instead of her normal 4 hours today. Reports "Monday,Tuesday, Thursday" Dialysis.   Patient denies fever, chills, nausea, vomiting, chest pain, shortness of breath, polyuria (states still makes urine but decreased amounts), dysuria, cough, headache, blurry vision, rhythmic movements, history of seizure, family history of seizure. Patient reports she is currently at her baseline.    Past Medical History  Diagnosis Date  . Hypertension   . Anemia   . Diabetes mellitus   . Peripheral vascular disease   . Hemodialysis patient 03/15/11    "Tues; Thurs; Sat; [facility]"  . Blood transfusion   . Chronic back pain   . Rash 03/15/11    "admitted me to find out what its' from; UE, waist, thighs, groin"  . Renal failure     hd 4/12      Past Surgical History  Procedure Date  . Insertion of dialysis catheter 07/29/10    right subclavian  . Arteriovenous graft placement 10/01/10    left upper arm  . Below knee leg amputation 2004    left  . Amputation 03/04/2011    Procedure: AMPUTATION DIGIT;  Surgeon: [name], MD;  Location: [facility] OR;  Service: Orthopedics;  Laterality: Right;  RT GREAT TOE AMP  . Cataract extraction w/ intraocular lens implant 12/2009    right eye  . Carpal tunnel release ~ 2009    left wrist  . Tee without cardioversion 03/18/2011    Procedure: TRANSESOPHAGEAL ECHOCARDIOGRAM (TEE);  Surgeon: [name], MD;  Location: [facility] ENDOSCOPY;  Service: Cardiovascular;  Laterality: N/A;    No family history on file. Social History:  reports that she has never smoked. She has never used smokeless tobacco. She reports that she does not drink alcohol or use illicit drugs.  Allergies:  Allergies  Allergen Reactions  . Codeine   . Levofloxacin   . Penicillins    No current facility-administered medications on file prior to encounter.   Current Outpatient Prescriptions on File Prior to Encounter  Medication Sig Dispense Refill  . Ertapenem Sodium (INVANZ IV) Inject 500 mg into the vein daily.      Marland Kitchen guaiFENesin-dextromethorphan (ROBITUSSIN DM) 100-10 MG/5ML syrup Take 30 mLs by mouth 4 (four) times daily as needed. For congestion      . HYDROcodone-acetaminophen (NORCO/VICODIN) 5-325 MG per tablet Take 1  tablet by mouth every 4 (four) hours as needed. For pain      . insulin aspart (NOVOLOG) 100 UNIT/ML injection Inject 0-10 Units into the skin 4 (four) times daily -  before meals and at bedtime. Sliding scale >151-200=2 units; 201-250=4 units; 251-300=6 units; 301-350=8units; 351-400=10units; >400 notify MD      . ipratropium-albuterol (DUONEB) 0.5-2.5 (3) MG/3ML SOLN Take 3 mLs by nebulization every 6 (six) hours as needed. For shortness of breath      . labetalol (NORMODYNE) 100 MG tablet Take 50 mg by  mouth 2 (two) times daily.      . metoCLOPramide (REGLAN) 5 MG tablet Take 5 mg by mouth 4 (four) times daily.      . metoprolol tartrate (LOPRESSOR) 25 MG tablet Take 12.5 mg by mouth 2 (two) times daily.      . ondansetron (ZOFRAN) 4 MG tablet Take 4 mg by mouth every 6 (six) hours as needed. For nausea      . pantoprazole (PROTONIX) 40 MG tablet Take 40 mg by mouth 2 (two) times daily.      . sertraline (ZOLOFT) 100 MG tablet Take 100 mg by mouth daily.        Results for orders placed during the hospital encounter of 03/19/12 (from the past 48 hour(s))  BASIC METABOLIC PANEL     Status: Abnormal   Collection Time   03/19/12  3:10 PM      Component Value Range Comment   Sodium 138  135 - 145 mEq/L    Potassium 5.0  3.5 - 5.1 mEq/L    Chloride 96  96 - 112 mEq/L    CO2 25  19 - 32 mEq/L    Glucose, Bld 104 (*) 70 - 99 mg/dL    BUN 30 (*) 6 - 23 mg/dL    Creatinine, Ser 1.61 (*) 0.50 - 1.10 mg/dL    Calcium 9.9  8.4 - 09.6 mg/dL    GFR calc non Af Amer 6 (*) >90 mL/min    GFR calc Af Amer 7 (*) >90 mL/min   CBC WITH DIFFERENTIAL     Status: Abnormal   Collection Time   03/19/12  3:10 PM      Component Value Range Comment   WBC 6.2  4.0 - 10.5 K/uL    RBC 4.72  3.87 - 5.11 MIL/uL    Hemoglobin 14.0  12.0 - 15.0 g/dL    HCT 04.5  40.9 - 81.1 %    MCV 90.5  78.0 - 100.0 fL    MCH 29.7  26.0 - 34.0 pg    MCHC 32.8  30.0 - 36.0 g/dL    RDW 91.4  78.2 - 95.6 %    Platelets 107 (*) 150 - 400 K/uL PLATELET COUNT CONFIRMED BY SMEAR   Neutrophils Relative 67  43 - 77 %    Neutro Abs 4.1  1.7 - 7.7 K/uL    Lymphocytes Relative 18  12 - 46 %    Lymphs Abs 1.1  0.7 - 4.0 K/uL    Monocytes Relative 8  3 - 12 %    Monocytes Absolute 0.5  0.1 - 1.0 K/uL    Eosinophils Relative 6 (*) 0 - 5 %    Eosinophils Absolute 0.4  0.0 - 0.7 K/uL    Basophils Relative 1  0 - 1 %    Basophils Absolute 0.1  0.0 - 0.1 K/uL   GLUCOSE, CAPILLARY     Status:  Abnormal   Collection Time   03/19/12  4:05  PM      Component Value Range Comment   Glucose-Capillary 108 (*) 70 - 99 mg/dL   URINALYSIS, ROUTINE W REFLEX MICROSCOPIC     Status: Abnormal   Collection Time   03/19/12  5:14 PM      Component Value Range Comment   Color, Urine AMBER (*) YELLOW BIOCHEMICALS MAY BE AFFECTED BY COLOR   APPearance CLEAR  CLEAR    Specific Gravity, Urine 1.018  1.005 - 1.030    pH 7.5  5.0 - 8.0    Glucose, UA NEGATIVE  NEGATIVE mg/dL    Hgb urine dipstick NEGATIVE  NEGATIVE    Bilirubin Urine SMALL (*) NEGATIVE    Ketones, ur NEGATIVE  NEGATIVE mg/dL    Protein, ur >782 (*) NEGATIVE mg/dL REPEATED TO VERIFY   Urobilinogen, UA 0.2  0.0 - 1.0 mg/dL    Nitrite NEGATIVE  NEGATIVE    Leukocytes, UA TRACE (*) NEGATIVE   URINE MICROSCOPIC-ADD ON     Status: Normal   Collection Time   03/19/12  5:14 PM      Component Value Range Comment   Squamous Epithelial / LPF RARE  RARE    WBC, UA 0-2  <3 WBC/hpf    Bacteria, UA RARE  RARE    CK: 46   Dg Chest 2 View 03/19/2012  *RADIOLOGY REPORT*  Clinical Data: Seizures.  CHEST - 2 VIEW    IMPRESSION: No acute cardiopulmonary abnormality seen.   Original Report Authenticated By: Lupita Raider.,  M.D.    Ct Head Wo Contrast  03/19/2012  *RADIOLOGY REPORT*  Clinical Data: Seizure.  CT HEAD WITHOUT CONTRAST  Technique:   IMPRESSION:  1.  Stable atrophy and white matter disease. 2.  No acute intracranial abnormality. 3.  Interval clearing of left sphenoid sinus disease. 4.  New bilateral mastoid effusions.   Original Report Authenticated By: Marin Roberts, M.D.     ROS Only compliant is back pain, which is chronic Blood pressure 122/76, pulse 74, resp. rate 16, SpO2 100.00%. Physical Exam  Constitutional: She appears well-developed and well-nourished. She is cooperative. No distress.       Jaundice appearing.   Eyes: EOM are normal. Pupils are equal, round, and reactive to light. Right eye exhibits no discharge. Left eye exhibits no discharge. No  scleral icterus.       Slightly icteric?  Neck: Neck supple.  Cardiovascular: Normal rate, regular rhythm and normal heart sounds.   No murmur heard. Respiratory: Effort normal and breath sounds normal. No respiratory distress. She has no wheezes. She has no rales.  GI: Soft. Bowel sounds are normal. She exhibits no distension and no mass. There is tenderness. There is no rebound and no guarding.       RUQ, LUQ and suprapubic tenderness  Genitourinary:     Musculoskeletal: She exhibits no edema and no tenderness.       Left BKA. Amputation site intact. No skin breakdown. Right foot: amputations of toe, no skin breakdown. +2/4 pulse. Sensation intact  Lymphadenopathy:    She has no cervical adenopathy.  Neurological: She is alert. She has normal strength. She displays tremor. No cranial nerve deficit or sensory deficit. GCS eye subscore is 4. GCS verbal subscore is 5. GCS motor subscore is 6.       Left hand contracture?  Tremor left >right hand Frequent yawning during exam. Oriented to person and place. Time of  year (Thanksgiving). Thought it was 2010.     Assessment/Plan KARYSA HEFT is 51 y.o. caucasian female with ESRD, Diabetes Mellitus, HTN, osteomyelitis, h/o PEA (September) that reportedly had seizure-like activity during dialysis. She was admitted for observation overnight due to her extensive comorbid diseases and altered mental status with h/o PEA.   1. Seizure-like activity/ AMS: - Seizure protocol - Telemetry - CK: 46 - Neuro check q 4 hours - trop I q 6hr - Ammonia level pending - LFT's  2. ESRD: - No IVF - Monitor BMP closely - BUN: 30   Cr: 7.46 (baseline: 2.85 - 6.41 over the past month)  3. Diabetes: - SSI  4. Decubitus stage 1: - Wound Care consulted - Frequent pericare  5. HTN: - Lopressor  6. Osteomyelitis:  - Patient was admitted on penem IV, however according to notes should have been DC'd? - Multiple scabs, ? Infections on bilateral  hands.  - Consider imaging studies ---> ortho consult?  7. FENGI: - No IVF - Carb modified, kidney diet - Protonix - Zofran  Heparin 5000u Code: Full, will clarify  Dispo:  Pending resolution of seizure-like activity and baseline mental status  Felix Pacini 03/19/2012, 6:45 PM  Family Medicine Upper Level Addendum:   I have seen and examined the patient independently, discussed with Dr. Claiborne Billings, fully reviewed the H+P and agree with it's contents with the additions as noted in blue text. My independent exam is below.   O: BP 122/76  Pulse 74  Resp 16  SpO2 100% Gen: NAD, resting comfortably in bed, eyes appear to have a twitch HEENT: NCAT, MMM, PERRLA  CV: RRR no mrg  Lungs: CTAB  Abd: soft/nondistended/normal bowel sounds. minimal diffuse pain, easily distractable  MSK: s/p right BKA,. moves all extremities, no edema. Patient with multiple amputations of digits (middle finger on both hands, right 1st toe), remaining fingers on right hand are erythematous with some blackened scale.  Skin: Patient with stage I ulcer on buttocks vs. candidal intertriginous rash in buttocks given satellite lesions.  Neuro: CN II-XII intact, sensation and reflexes normal throughout, 5/5 muscle strength in bilateral upper and lower extremities. Slight tremor of left arm > right.   A/P: 51 y.o. caucasian female with ESRD, Diabetes Mellitus, HTN, osteomyelitis, h/o PEA (September) that reportedly had seizure-like activity during dialysis with only known history of syncope.  Syncope/AMS/?seizure-negative head CT so doubt stroke. No focal neurological deficits currently. Unclear history seizure, will ask for renal input tomorrow on history from dialysis center (reportedly already consulted). No clear indication for neuro consult/ativan/ at this time. Ammonia unremarkable for hepatic origin for encephalopathy.  EKG not concerning for acute ACS (does have 0.64mm depression in V3-V6 but unchanged from  previous). Place patient on telemetry, cycle troponin's q4 neuro checks, seizure precautions, observation overnight. For now will allow diet as patietn has returned to baseline mentation.    Fingers on left hand concerning for osteomyelitis given darkened appearance and historical x-ray findings from last admission. patient completed course of Invanz for osteo at base of 3rd finger right hand. Could consider repeat x-ray while in house to see if any advancement while on antibiotics. May need to contact Dr. Lajoyce Corners who amputated right first toe in 2012.   For hypertension, on review have decided to hold labetalol as unclear why dual beta blockers.   Wound care consult for buttocks, may need to consider diflucan therapy for candidal infection.   Tana Conch, MD, PGY2 03/19/2012 8:50 PM

## 2012-03-19 NOTE — ED Notes (Signed)
Notified Patient's RN of B/p 90/53

## 2012-03-19 NOTE — ED Notes (Signed)
Dr. Wentz at bedside. 

## 2012-03-20 ENCOUNTER — Encounter (HOSPITAL_COMMUNITY): Payer: Self-pay

## 2012-03-20 DIAGNOSIS — R55 Syncope and collapse: Secondary | ICD-10-CM

## 2012-03-20 LAB — CBC
HCT: 37.4 % (ref 36.0–46.0)
Hemoglobin: 12 g/dL (ref 12.0–15.0)
MCH: 29.1 pg (ref 26.0–34.0)
MCHC: 32.1 g/dL (ref 30.0–36.0)
RBC: 4.13 MIL/uL (ref 3.87–5.11)

## 2012-03-20 LAB — COMPREHENSIVE METABOLIC PANEL
Alkaline Phosphatase: 341 U/L — ABNORMAL HIGH (ref 39–117)
BUN: 37 mg/dL — ABNORMAL HIGH (ref 6–23)
CO2: 26 mEq/L (ref 19–32)
GFR calc Af Amer: 6 mL/min — ABNORMAL LOW (ref >90)
GFR calc non Af Amer: 5 mL/min — ABNORMAL LOW (ref >90)
Glucose, Bld: 62 mg/dL — ABNORMAL LOW (ref 70–99)
Potassium: 5.1 mEq/L (ref 3.5–5.1)
Total Protein: 8.1 g/dL (ref 6.0–8.3)

## 2012-03-20 LAB — GLUCOSE, CAPILLARY
Glucose-Capillary: 69 mg/dL — ABNORMAL LOW (ref 70–99)
Glucose-Capillary: 121 mg/dL — ABNORMAL HIGH (ref 70–99)
Glucose-Capillary: 121 mg/dL — ABNORMAL HIGH (ref 70–99)

## 2012-03-20 LAB — TROPONIN I: Troponin I: <0.3 ng/mL (ref <0.30)

## 2012-03-20 LAB — MRSA PCR SCREENING: MRSA by PCR: NEGATIVE

## 2012-03-20 MED ORDER — ACETAMINOPHEN 325 MG PO TABS: 650.0000 mg | ORAL_TABLET | Freq: Four times a day (QID) | ORAL | Status: DC | PRN

## 2012-03-20 MED ORDER — ACETAMINOPHEN 325 MG PO TABS
650.0000 mg | ORAL_TABLET | Freq: Four times a day (QID) | ORAL | Status: DC | PRN
  Filled 2012-03-20: qty 2

## 2012-03-20 MED ORDER — DEXTROSE 50 % IV SOLN
INTRAVENOUS | Status: AC
  Administered 2012-03-20: 50 mL via INTRAVENOUS
  Filled 2012-03-20: qty 50

## 2012-03-20 MED ORDER — LIDOCAINE HCL (PF) 1 % IJ SOLN: 5.0000 mL | INTRAMUSCULAR | Status: DC | PRN

## 2012-03-20 MED ORDER — HEPARIN SODIUM (PORCINE) 1000 UNIT/ML DIALYSIS: 20.0000 [IU]/kg | INTRAMUSCULAR | Status: DC | PRN

## 2012-03-20 MED ORDER — ONDANSETRON HCL 4 MG PO TABS
4.0000 mg | ORAL_TABLET | Freq: Four times a day (QID) | ORAL | Status: DC | PRN
  Filled 2012-03-20: qty 1

## 2012-03-20 MED ORDER — HYDROXYZINE HCL 25 MG PO TABS
25.0000 mg | ORAL_TABLET | Freq: Three times a day (TID) | ORAL | Status: DC | PRN
  Filled 2012-03-20: qty 1

## 2012-03-20 MED ORDER — SERTRALINE HCL 100 MG PO TABS
100.0000 mg | ORAL_TABLET | Freq: Every day | ORAL | Status: DC
  Administered 2012-03-20 – 2012-03-21 (×2): 100 mg via ORAL
  Filled 2012-03-20 (×2): qty 1

## 2012-03-20 MED ORDER — PENTAFLUOROPROP-TETRAFLUOROETH EX AERO: 1.0000 "application " | INHALATION_SPRAY | CUTANEOUS | Status: DC | PRN

## 2012-03-20 MED ORDER — HEPARIN SODIUM (PORCINE) 5000 UNIT/ML IJ SOLN
5000.0000 [IU] | Freq: Three times a day (TID) | INTRAMUSCULAR | Status: DC
  Administered 2012-03-20 – 2012-03-21 (×4): 5000 [IU] via SUBCUTANEOUS
  Filled 2012-03-20 (×8): qty 1

## 2012-03-20 MED ORDER — SODIUM CHLORIDE 0.9 % IV SOLN: 100.0000 mL | INTRAVENOUS | Status: DC | PRN

## 2012-03-20 MED ORDER — INSULIN ASPART 100 UNIT/ML ~~LOC~~ SOLN
0.0000 [IU] | Freq: Three times a day (TID) | SUBCUTANEOUS | Status: DC
  Administered 2012-03-21: 2 [IU] via SUBCUTANEOUS

## 2012-03-20 MED ORDER — SENNA 8.6 MG PO TABS
1.0000 | ORAL_TABLET | Freq: Two times a day (BID) | ORAL | Status: DC
  Administered 2012-03-20 – 2012-03-21 (×2): 8.6 mg via ORAL
  Filled 2012-03-20 (×5): qty 1

## 2012-03-20 MED ORDER — ALTEPLASE 2 MG IJ SOLR
2.0000 mg | Freq: Once | INTRAMUSCULAR | Status: DC | PRN
  Filled 2012-03-20: qty 2

## 2012-03-20 MED ORDER — LIDOCAINE-PRILOCAINE 2.5-2.5 % EX CREA
1.0000 "application " | TOPICAL_CREAM | CUTANEOUS | Status: DC | PRN
  Filled 2012-03-20: qty 5

## 2012-03-20 MED ORDER — DEXTROSE 50 % IV SOLN
1.0000 | Freq: Once | INTRAVENOUS | Status: AC
  Administered 2012-03-20: 50 mL via INTRAVENOUS

## 2012-03-20 MED ORDER — NEPRO/CARBSTEADY PO LIQD
237.0000 mL | Freq: Three times a day (TID) | ORAL | Status: DC | PRN
  Filled 2012-03-20: qty 237

## 2012-03-20 MED ORDER — DOCUSATE SODIUM 283 MG RE ENEM
1.0000 | ENEMA | RECTAL | Status: DC | PRN
  Filled 2012-03-20: qty 1

## 2012-03-20 MED ORDER — SODIUM CHLORIDE 0.9 % IJ SOLN
3.0000 mL | Freq: Two times a day (BID) | INTRAMUSCULAR | Status: DC
  Administered 2012-03-20 – 2012-03-21 (×3): 3 mL via INTRAVENOUS

## 2012-03-20 MED ORDER — ONDANSETRON HCL 4 MG PO TABS: 4.0000 mg | ORAL_TABLET | Freq: Four times a day (QID) | ORAL | Status: DC | PRN

## 2012-03-20 MED ORDER — ONDANSETRON HCL 4 MG/2ML IJ SOLN: 4.0000 mg | Freq: Four times a day (QID) | INTRAMUSCULAR | Status: DC | PRN

## 2012-03-20 MED ORDER — ZOLPIDEM TARTRATE 5 MG PO TABS: 5.0000 mg | ORAL_TABLET | Freq: Every evening | ORAL | Status: DC | PRN

## 2012-03-20 MED ORDER — SORBITOL 70 % SOLN
30.0000 mL | Status: DC | PRN
  Filled 2012-03-20: qty 30

## 2012-03-20 MED ORDER — LANTHANUM CARBONATE 500 MG PO CHEW
1000.0000 mg | CHEWABLE_TABLET | Freq: Three times a day (TID) | ORAL | Status: DC
  Administered 2012-03-20 – 2012-03-21 (×3): 1000 mg via ORAL
  Filled 2012-03-20 (×6): qty 2

## 2012-03-20 MED ORDER — METOCLOPRAMIDE HCL 5 MG PO TABS
5.0000 mg | ORAL_TABLET | Freq: Three times a day (TID) | ORAL | Status: DC
  Administered 2012-03-20 – 2012-03-21 (×4): 5 mg via ORAL
  Filled 2012-03-20 (×9): qty 1

## 2012-03-20 MED ORDER — ACETAMINOPHEN 650 MG RE SUPP: 650.0000 mg | Freq: Four times a day (QID) | RECTAL | Status: DC | PRN

## 2012-03-20 MED ORDER — METOPROLOL TARTRATE 12.5 MG HALF TABLET
12.5000 mg | ORAL_TABLET | Freq: Two times a day (BID) | ORAL | Status: DC
  Administered 2012-03-20 – 2012-03-21 (×3): 12.5 mg via ORAL
  Filled 2012-03-20 (×5): qty 1

## 2012-03-20 MED ORDER — PANTOPRAZOLE SODIUM 40 MG PO TBEC
40.0000 mg | DELAYED_RELEASE_TABLET | Freq: Two times a day (BID) | ORAL | Status: DC
  Administered 2012-03-20 – 2012-03-21 (×3): 40 mg via ORAL
  Filled 2012-03-20 (×3): qty 1

## 2012-03-20 MED ORDER — CALCIUM CARBONATE 1250 MG/5ML PO SUSP
500.0000 mg | Freq: Four times a day (QID) | ORAL | Status: DC | PRN
  Filled 2012-03-20: qty 5

## 2012-03-20 MED ORDER — RENA-VITE PO TABS
1.0000 | ORAL_TABLET | Freq: Every day | ORAL | Status: DC
  Administered 2012-03-20: 1 via ORAL
  Filled 2012-03-20 (×3): qty 1

## 2012-03-20 MED ORDER — NEPRO/CARBSTEADY PO LIQD
237.0000 mL | Freq: Three times a day (TID) | ORAL | Status: DC
  Administered 2012-03-20 – 2012-03-21 (×4): 237 mL via ORAL
  Filled 2012-03-20 (×3): qty 237

## 2012-03-20 MED ORDER — ACETAMINOPHEN 650 MG RE SUPP
650.0000 mg | Freq: Four times a day (QID) | RECTAL | Status: DC | PRN
  Filled 2012-03-20: qty 1

## 2012-03-20 MED ORDER — HEPARIN SODIUM (PORCINE) 1000 UNIT/ML DIALYSIS: 1000.0000 [IU] | INTRAMUSCULAR | Status: DC | PRN

## 2012-03-20 MED ORDER — CAMPHOR-MENTHOL 0.5-0.5 % EX LOTN
1.0000 "application " | TOPICAL_LOTION | Freq: Three times a day (TID) | CUTANEOUS | Status: DC | PRN
  Filled 2012-03-20: qty 222

## 2012-03-20 NOTE — Consult Note (Signed)
WOC consult Note Reason for Consult: skin breakdown, buttocks. Pt is incontinent of stool and has blanchable redness of the bilateral buttocks. She does reports she is non ambulatory at SNF, does not use prothesis.  States they get her up to [facility] for treatments, she has been in HD since 8am.   Noted as well she has necrosis of the left fingers (1st, and 3rd) more severe on the 3rd, with surrounding erythema of the 3rd digit.   Wound type: MASD (moisture associated skin damage) bilateral buttocks Pressure Ulcer POA: Yes/No Measurement:redness no noted openings Periwound: intact and blanchable Dressing procedure/placement/frequency: zinc based barrier ointment to the buttocks, to applied liberally to the buttocks daily and after each incontinence episode.  Re consult if needed, will not follow at this time. Thanks  Beckie Viscardi [employer], CWOCN [phone])

## 2012-03-20 NOTE — Progress Notes (Signed)
Chaplain responded to request for Spiritual Care Consult.  Found patient resting quietly.  Patient responded when addressed but appeared confused in her responses.  Patient stated she does not know what is going on and she is afraid.  Chaplain provided spiritual and emotional support and prayed with patient.  Will follow up as needed.  Chaplain Rutherford Nail

## 2012-03-20 NOTE — Progress Notes (Addendum)
MAR received from her NH  Meds at [facility]:  Invanz 500 mg q 24 hrs for 42 days started 11/01 for osteo labetolol 50 mg bid hold in am on HD days Metoprolol 12.5 bid; hold in am of HD days reglan 5 q 6 hours   protonix 40 bid zoloft 100 No binders  PRNs duonebs vicodin 5/325 Phenergan 25 zofran 4 mg   Bard Herbert, PA-C

## 2012-03-20 NOTE — Progress Notes (Signed)
INITIAL ADULT NUTRITION ASSESSMENT Date: 03/20/2012   Time: 10:29 AM  Reason for Assessment: Malnutrition Screening  INTERVENTION: 1. Recommend swallow evaluation to determine most appropriate diet texture and liquid consistency given pt's diet PTA was Mechanical Soft with Honey Thickened Liquids per RD at [facility]. Recommend pt is NPO until SLP evaluation. MD paged regarding this issue. 2. MD: Monitor magnesium, potassium, and phosphorus daily for at least 3 days, MD to replete as needed, as pt is at risk for refeeding syndrome given dx of severe malnutrition and recent significant weight loss. 3. RD to add supplements once pt seen by SLP 4. Recommend Nephro-Vite 5. RD to continue to follow nutrition care plan  DOCUMENTATION CODES Per approved criteria  -Severe malnutrition in the context of chronic illness   ASSESSMENT: Female 51 y.o.  Dx: AMS and possible seizure activity  Hx:  Past Medical History  Diagnosis Date  . Hypertension   . Anemia   . Diabetes mellitus   . Peripheral vascular disease   . Hemodialysis patient 03/15/11    "Tues; Thurs; Sat; [facility]"  . Blood transfusion   . Chronic back pain   . Rash 03/15/11    "admitted me to find out what its' from; UE, waist, thighs, groin"  . Renal failure     hd 4/12   Past Surgical History  Procedure Date  . Insertion of dialysis catheter 07/29/10    right subclavian  . Arteriovenous graft placement 10/01/10    left upper arm  . Below knee leg amputation 2004    left  . Amputation 03/04/2011    Procedure: AMPUTATION DIGIT;  Surgeon: [name], MD;  Location: [facility] OR;  Service: Orthopedics;  Laterality: Right;  RT GREAT TOE AMP  . Cataract extraction w/ intraocular lens implant 12/2009    right eye  . Carpal tunnel release ~ 2009    left wrist  . Tee without cardioversion 03/18/2011    Procedure: TRANSESOPHAGEAL ECHOCARDIOGRAM (TEE);  Surgeon: [name], MD;  Location: [facility]  ENDOSCOPY;  Service: Cardiovascular;  Laterality: N/A;   Related Meds:     . heparin  5,000 Units Subcutaneous Q8H  . insulin aspart  0-10 Units Subcutaneous TID AC & HS  . metoCLOPramide  5 mg Oral TID AC & HS  . metoprolol tartrate  12.5 mg Oral BID  . pantoprazole  40 mg Oral BID  . senna  1 tablet Oral BID  . sertraline  100 mg Oral Daily  . sodium chloride  3 mL Intravenous Q12H   Ht: 5\' 3"  (160 cm)  Wt: 130 lb 11.7 oz (59.3 kg)  Ideal Wt: 115 lb/52.3 kg Adjusted Ideal Wt (L BKA): 49.2 kg % Ideal Wt: 121%  Wt Readings from Last 15 Encounters:  03/20/12 130 lb 11.7 oz (59.3 kg)  01/27/12 185 lb 6.5 oz (84.1 kg)  03/20/11 191 lb 12.8 oz (87 kg)  03/20/11 191 lb 12.8 oz (87 kg)  03/20/11 191 lb 12.8 oz (87 kg)  03/05/11 208 lb 12.4 oz (94.7 kg)  03/05/11 208 lb 12.4 oz (94.7 kg)  BKA in 2004 Usual Wt: 190 lb/88 kg - 2 months ago % Usual Wt: 67%; 33% wt loss x 2 months  BMI is 24.6 (using adjusted wt for BKA) Weight is WNL  Labs:  CMP     Component Value Date/Time   NA 138 03/20/2012 0630   K 5.1 03/20/2012 0630   CL 96 03/20/2012 0630  CO2 26 03/20/2012 0630   GLUCOSE 62* 03/20/2012 0630   BUN 37* 03/20/2012 0630   CREATININE 8.14* 03/20/2012 0630   CALCIUM 9.7 03/20/2012 0630   CALCIUM 8.0* 03/02/2009 1350   PROT 8.1 03/20/2012 0630   ALBUMIN 3.1* 03/20/2012 0630   AST 27 03/20/2012 0630   ALT <5 03/20/2012 0630   ALKPHOS 341* 03/20/2012 0630   BILITOT 0.4 03/20/2012 0630   GFRNONAA 5* 03/20/2012 0630   GFRAA 6* 03/20/2012 0630   No recent magnesium or phosphorus.  No intake or output data in the 24 hours ending 03/20/12 1030  Diet Order: Renal 80-90; 1200 ml Fluid Restriction  Supplements/Tube Feeding: Nepro TID PRN  IVF:    Estimated Nutritional Needs:   Kcal: 1600 - 1800 kcal Protein: 72 - 85 grams Fluid: 1.2 liters daily  Pt with baseline mental status deficits, possibly from PEA in September. Noted per MD, pt is jaundice appearing.  Stage I pressure ulcer on buttocks.  RD saw pt in HD. Pt complaining of pain. Pt unable to state usual weight. Confirms weight loss. RD unable to complete physical exam, however pt with severe muscle wasting in temples. Discussed weight history with PA Bard Herbert. Per HD center notes, pt's weight was approximately 88 kg prior to PEA in September. Pt with weight loss of 33% wt loss in 2 months. Pt meets criteria for severe MALNUTRITION in the context of chronic illness as evidenced by <75% of estimated energy intake x at least 1 month and severe muscle mass wasting.  This RD called patient's facility to discuss recent weight loss. Per RD at facility, unsure if pt has had weight loss. RD unable to provide any information on PO intake or supplement use. Does note that pt is on a mechanical soft diet with honey thickened liquids at facility. This RD paged MD regarding this.  NUTRITION DIAGNOSIS: Unintentional wt loss r/t unknown etiology AEB wt loss of 33% x 2 months.  MONITORING/EVALUATION(Goals): Goal: Pt to meet >/= 90% of their estimated nutrition needs Monitor: weight trends, lab trends, I/O's, PO intake, supplement tolerance  EDUCATION NEEDS: -No education needs identified at this time  Jarold Motto MS, RD, LDN Pager: [phone] After-hours pager: [phone]

## 2012-03-20 NOTE — H&P (Signed)
Family Medicine Teaching Service Attending Note  I interviewed and examined patient [name] and reviewed their tests and x-rays.  I discussed with Dr. Durene Cal and reviewed their note for today.  I agree with their assessment and plan.     Additionally  This AM is awake alert and oriented to self place and year 2014 She feels she is in her normal state of health No obvious signs of CNS or cardiac source of possible syncope  ? Syncope - need to contact place where this occurred to try to get more information on event.  Has history of arrest (see previous admit) so could be at high risk for cardiac source - continue monitoring and contact patients cardiologist (assume she has one).   Also need to clarify current condition with her next of kin (prior notes mention husband) and her EOL wishes

## 2012-03-20 NOTE — Consult Note (Signed)
[facility] Renal Consultation Note    Indication for Consultation:  Management of ESRD/hemodialysis; anemia, hypertension/volume and secondary hyperparathyroidism  HPI: [name] is a 51 y.o. female with ESRD with ESRD secondary to DM and HTN on HD since April 2012.  She was discharged from [facility] to  [facility] 02/24/2012. She had been admitted there 10/04 after an acute admission at [facility] for PEA arrest with VDRF, septic shock and infiltrates. She had been dialyzing without problems at [facility] since her discharge from [facility].  She was having an uneventful treatment when about an hour and 20 minutes into treatment she reportedly had the onset of tonic clonic seizure like activity and was sent to the ED.  When evaluated in the ED, she was at baseline mentally and had no recollection of the event.  She tells me today, she has had seizures in the past, but is not on seizure medications.  CXR and Head CT were negative in the ED. WBC and diff nromal. Pt was and is still afebrile.  At present, she is on dialysis. Goal is set at 1 liter, but BP dropped so she is being kept even. She denies HA/visual changes. She has no pain anywhere except her back and the needles in her HD access.  She has no SOB, CP, N, V, D, fever or chills.  Appetite is poor.  She has no feeling in her right LE (not new).  She is not aware of any sores on her bottom. She makes a small amount of urine, but has no dysuria.  Past Medical History  Diagnosis Date  . Hypertension   . Anemia   . Diabetes mellitus   . Peripheral vascular disease   . Hemodialysis patient 03/15/11    "Tues; Thurs; Sat; [facility]"  . Blood transfusion   . Chronic back pain   . Rash 03/15/11    "admitted me to find out what its' from; UE, waist, thighs, groin"  . Renal failure     hd 4/12   Past Surgical History  Procedure Date  . Insertion of dialysis catheter 07/29/10    right subclavian  .  Arteriovenous graft placement 10/01/10    left upper arm  . Below knee leg amputation 2004    left  . Amputation 03/04/2011    Procedure: AMPUTATION DIGIT;  Surgeon: [name], MD;  Location[facility] OR;  Service: Orthopedics;  Laterality: Right;  RT GREAT TOE AMP  . Cataract extraction w/ intraocular lens implant 12/2009    right eye  . Carpal tunnel release ~ 2009    left wrist  . Tee without cardioversion 03/18/2011    Procedure: TRANSESOPHAGEAL ECHOCARDIOGRAM (TEE);  Surgeon: [name], MD;  Location: [facility] ENDOSCOPY;  Service: Cardiovascular;  Laterality: N/A;   History reviewed. No pertinent family history. Social History:  reports that she has never smoked. She has never used smokeless tobacco. She reports that she does not drink alcohol or use illicit drugs. Allergies  Allergen Reactions  . Codeine   . Levofloxacin   . Penicillins    Prior to Admission medications   Medication Sig Start Date End Date Taking? Authorizing Provider  Ertapenem Sodium (INVANZ IV) Inject 500 mg into the vein daily.   Yes Historical Provider, MD  guaiFENesin-dextromethorphan (ROBITUSSIN DM) 100-10 MG/5ML syrup Take 30 mLs by mouth every 6 (six) hours as needed. For congestion   Yes Historical Provider, MD  HYDROcodone-acetaminophen (NORCO/VICODIN) 5-325 MG per tablet  Take 1 tablet by mouth every 4 (four) hours as needed. For pain   Yes Historical Provider, MD  insulin aspart (NOVOLOG) 100 UNIT/ML injection Inject 0-10 Units into the skin 4 (four) times daily -  before meals and at bedtime. Sliding scale >151-200=2 units; 201-250=4 units; 251-300=6 units; 301-350=8units; 351-400=10units; >400 notify MD   Yes Historical Provider, MD  ipratropium-albuterol (DUONEB) 0.5-2.5 (3) MG/3ML SOLN Take 3 mLs by nebulization every 6 (six) hours.   Yes Historical Provider, MD  labetalol (NORMODYNE) 100 MG tablet Take 50 mg by mouth 2 (two) times daily.   Yes Historical Provider, MD  metoCLOPramide (REGLAN) 5 MG tablet  Take 5 mg by mouth 4 (four) times daily.   Yes Historical Provider, MD  metoprolol tartrate (LOPRESSOR) 25 MG tablet Take 12.5 mg by mouth 2 (two) times daily.   Yes Historical Provider, MD  ondansetron (ZOFRAN) 4 MG tablet Take 4 mg by mouth every 6 (six) hours as needed. For nausea   Yes Historical Provider, MD  pantoprazole (PROTONIX) 40 MG tablet Take 40 mg by mouth 2 (two) times daily.   Yes Historical Provider, MD  sertraline (ZOLOFT) 100 MG tablet Take 100 mg by mouth daily.   Yes Historical Provider, MD   Current Facility-Administered Medications  Medication Dose Route Frequency Provider Last Rate Last Dose  . 0.9 %  sodium chloride infusion  100 mL Intravenous PRN Sheffield Slider, PA      . 0.9 %  sodium chloride infusion  100 mL Intravenous PRN Sheffield Slider, PA      . acetaminophen (TYLENOL) tablet 650 mg  650 mg Oral Q6H PRN Sheffield Slider, PA       Or  . acetaminophen (TYLENOL) suppository 650 mg  650 mg Rectal Q6H PRN Sheffield Slider, PA      . alteplase (CATHFLO ACTIVASE) injection 2 mg  2 mg Intracatheter Once PRN Sheffield Slider, PA      . calcium carbonate (dosed in mg elemental calcium) suspension 500 mg of elemental calcium  500 mg of elemental calcium Oral Q6H PRN Sheffield Slider, PA      . camphor-menthol [facility]) lotion 1 application  1 application Topical Q8H PRN Sheffield Slider, PA       And  . hydrOXYzine (ATARAX/VISTARIL) tablet 25 mg  25 mg Oral Q8H PRN Sheffield Slider, PA      . docusate sodium [facility]) enema 283 mg  1 enema Rectal PRN Sheffield Slider, PA      . feeding supplement (NEPRO CARB STEADY) liquid 237 mL  237 mL Oral TID PRN Sheffield Slider, PA      . heparin injection 1,000 Units  1,000 Units Dialysis PRN Sheffield Slider, PA      . heparin injection 1,200 Units  20 Units/kg Dialysis PRN Sheffield Slider, PA      . heparin injection 5,000 Units  5,000 Units Subcutaneous Q8H Renee A Kuneff, DO   5,000 Units at 03/20/12 0618  .  insulin aspart (novoLOG) injection 0-10 Units  0-10 Units Subcutaneous TID AC & HS Renee A Kuneff, DO      . lidocaine (XYLOCAINE) 1 % injection 5 mL  5 mL Intradermal PRN Sheffield Slider, PA      . lidocaine-prilocaine (EMLA) cream 1 application  1 application Topical PRN Sheffield Slider, PA      . metoCLOPramide (REGLAN) tablet 5 mg  5 mg Oral TID AC &  HS Renee A Kuneff, DO      . metoprolol tartrate (LOPRESSOR) tablet 12.5 mg  12.5 mg Oral BID Renee A Kuneff, DO      . ondansetron (ZOFRAN) tablet 4 mg  4 mg Oral Q6H PRN Sheffield Slider, PA       Or  . ondansetron (ZOFRAN) injection 4 mg  4 mg Intravenous Q6H PRN Sheffield Slider, PA      . pantoprazole (PROTONIX) EC tablet 40 mg  40 mg Oral BID Renee A Kuneff, DO      . pentafluoroprop-tetrafluoroeth (GEBAUERS) aerosol 1 application  1 application Topical PRN Sheffield Slider, PA      . senna (SENOKOT) tablet 8.6 mg  1 tablet Oral BID Renee A Kuneff, DO      . sertraline (ZOLOFT) tablet 100 mg  100 mg Oral Daily Renee A Kuneff, DO      . sodium chloride 0.9 % injection 3 mL  3 mL Intravenous Q12H Renee A Kuneff, DO      . sorbitol 70 % solution 30 mL  30 mL Oral PRN Sheffield Slider, PA      . zolpidem (AMBIEN) tablet 5 mg  5 mg Oral QHS PRN Sheffield Slider, PA      . [DISCONTINUED] acetaminophen (TYLENOL) suppository 650 mg  650 mg Rectal Q6H PRN Renee A Kuneff, DO      . [DISCONTINUED] acetaminophen (TYLENOL) tablet 650 mg  650 mg Oral Q6H PRN Renee A Kuneff, DO      . [DISCONTINUED] ondansetron (ZOFRAN) injection 4 mg  4 mg Intravenous Q6H PRN Renee A Kuneff, DO      . [DISCONTINUED] ondansetron (ZOFRAN) tablet 4 mg  4 mg Oral Q6H PRN Renee A Kuneff, DO      . [DISCONTINUED] ondansetron (ZOFRAN) tablet 4 mg  4 mg Oral Q6H PRN Natalia Leatherwood, DO       Labs: Basic Metabolic Panel:  Lab 03/20/12 1610 03/19/12 1510  NA 138 138  K 5.1 5.0  CL 96 96  CO2 26 25  GLUCOSE 62* 104*  BUN 37* 30*  CREATININE 8.14* 7.46*  CALCIUM  9.7 9.9  ALB -- --  PHOS -- --   Liver Function Tests:  Lab 03/20/12 0630 03/19/12 1942  AST 27 32  ALT <5 <5  ALKPHOS 341* 363*  BILITOT 0.4 0.4  PROT 8.1 8.3  ALBUMIN 3.1* 3.1*    Lab 03/19/12 1943  AMMONIA 21   CBC:  Lab 03/20/12 0630 03/19/12 1510  WBC 6.6 6.2  NEUTROABS -- 4.1  HGB 12.0 14.0  HCT 37.4 42.7  MCV 90.6 90.5  PLT 117* 107*   Cardiac Enzymes:  Lab 03/20/12 0630 03/20/12 0123 03/19/12 1942 03/19/12 1510  CKTOTAL -- -- -- 46  CKMB -- -- -- --  CKMBINDEX -- -- -- --  TROPONINI <0.30 <0.30 <0.30 --   CBG:  Lab 03/20/12 0121 03/20/12 0032 03/19/12 1605 03/13/12 1318  GLUCAP 69* 73 108* 90  Studies/Results: Dg Chest 2 View  03/19/2012  *RADIOLOGY REPORT*  Clinical Data: Seizures.  CHEST - 2 VIEW  Comparison: February 25, 2012.  Findings: Cardiomediastinal silhouette appears normal.  No acute pulmonary disease is noted.  Right internal jugular catheter tip is noted in expected position of cavoatrial junction.  Bony thorax is intact.  IMPRESSION: No acute cardiopulmonary abnormality seen.   Original Report Authenticated By: Lupita Raider.,  M.D.    Ct Head Wo Contrast  03/19/2012  *RADIOLOGY REPORT*  Clinical Data: Seizure.  CT HEAD WITHOUT CONTRAST  Technique:  Contiguous axial images were obtained from the base of the skull through the vertex without contrast.  Comparison: CT head without contrast 01/22/2012.  Findings: Mild generalized atrophy is present.  Periventricular white matter hypoattenuation is similar to the prior study.  No acute cortical infarct, hemorrhage, or mass lesion is present. The ventricles are proportionate to the degree of atrophy.  No significant extra-axial fluid collection is present.  The paranasal sinuses are clear.  There is some fluid in the mastoid air cells bilaterally, worse on the left.  No obstructing nasopharyngeal lesion is evident.  Atherosclerotic calcifications are again noted within the cavernous carotid arteries  bilaterally.  IMPRESSION:  1.  Stable atrophy and white matter disease. 2.  No acute intracranial abnormality. 3.  Interval clearing of left sphenoid sinus disease. 4.  New bilateral mastoid effusions.   Original Report Authenticated By: Marin Roberts, M.D.    ROS:  As per HPI  Physical Exam: Filed Vitals:   03/20/12 0833 03/20/12 0900 03/20/12 0929 03/20/12 0957  BP: 155/63 105/41 89/41 102/41  Pulse: 78 86 88 88  Temp:      TempSrc:      Resp:      Height:      Weight:      SpO2:         General: Pale in Trendelenburg on HD appears older than stated age Head: Normocephalic, atraumatic, sclera non-icteric, mouth dry Neck: Supple. JVD not elevated. Lungs: Clear bilaterally to auscultation without wheezes, rales, or rhonchi. Breathing is unlabored. Heart: RRR with S1 S2. No murmurs, rubs, or gallops appreciated. Abdomen: Soft, non-tender, non-distended with normoactive bowel sounds. No rebound/guarding. No obvious abdominal masses. Lower abdominal midline scar M-S:  Strength and tone diminished  for age. 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 Moves all extremities spontaneously. Psych:  Responds to questions appropriately with a normal affect. Dialysis Access: left upper AVF Qb300  Dialysis Orders: Center: [facility]  on MWF EDW 62 HD [place] 2K  Time 4 Heparin 6100. Access left upper AVF BFR 400 DFR 600   Zemplar none mcg IV/HD Epogen none  Units IV/HD  Venofer 50/wee  Assessment/Plan: 1. Seizure activity with LOC work up negative so far; per primary;  2. ESRD -  MWF - had partial HD treatment yesterday.  Completing today. Keeping even due to BP drop. HD Wed if still here to keep on usual HD schedule. Some difficulty with AVF today.  Use 16 gauge and Qb 300 tomorrow. 3. Hypertension/volume  -  BP variable. Raise EDW due  to clear CXR.  - on metoprolol 12.5 bid;  4. Anemia  - Hgb 12, no ESA; weekly Fe. Last tsat 44% 5. Metabolic bone disease -  Not on zemplar; it is not known if she is on binders; the kidney center did not have a current med list. iPTH was not drawn with November labs and none from October due to [facility] admission.  I have requested a current MAR be sent from her NH. P at her HD center was 6.9. She doesn't know what binders she is on, but thinks it is some kind of pill.  Will review MAR when we get it and add binders. 6. Nutrition - renal diet; add nepro;  she has lost about 50# since late September.  Her EDW was 20 KK 9/.24 at her dialysis center.  Spoke with RD. She said the pt was on a mech soft and honey thick liquid at her NH.  Has been getting thin liquids here.  She will discuss with the resident. 7. Decubitus - in the setting of extreme weight loss.  Interestingly, her albumin was 3.9 with 11/20 labs at her dialysis center.  Per primary 8. DM - diet controlled 9. Thrombocytopenia - acute drop; they were 174,000 on 03/14/12 follow  Sheffield Slider, PA-[name] [facility] Beeper [phone] 03/20/2012, 10:14 AM   Attending Note:  [name] reports to me no prior history of seizures and has no recollection of the events related to her current hospitalization.  She does not realize she has been here overnight.  Her current exam shows slowed speech but appropriate answers, eyes are fluttering and there is some nystagmus.  She is hypotensive and her skin turgor is markedly diminished and there is no edema.  Hbg is 12g today, Hgb was 14 on admission and only 11g on 11/2.  Blood sugar was 62mg /dl this AM.  She probably needs a neurological consultation and close monitoring for hypoglycemia.  [name]

## 2012-03-20 NOTE — Procedures (Signed)
She is hypotensive and appears clinically hypovolemic with diminshed skin turgor.  We will give a liter of saline during dialysis.  Sheis also hemoconcentrated with a Hgb of 14 on admission.  Otherwise HD is proceeding without events today. Bowen Kia C

## 2012-03-20 NOTE — Progress Notes (Signed)
Family Medicine Teaching Service Daily Progress Note Service Page: [phone]  Patient Assessment: 51 y.o. caucasian female with ESRD, DM, HTN, osteomyelitis, h/o PEA (September) that reportedly had seizure-like activity during dialysis on 11/25.   Subjective:  Feeling in her normal health today, has not eaten this morning. Has not had any more episodes.   Objective: Temp:  [97.7 F (36.5 C)-98 F (36.7 C)] 97.9 F (36.6 C) (11/26 0823) Pulse Rate:  [64-85] 81  (11/26 0823) Resp:  [12-16] 12  (11/26 0823) BP: (77-140)/(50-125) 138/54 mmHg (11/26 0823) SpO2:  [96 %-100 %] 97 % (11/26 0823) Weight:  [126 lb 12.2 oz (57.5 kg)-130 lb 11.7 oz (59.3 kg)] 130 lb 11.7 oz (59.3 kg) (11/26 1914)  Exam: Gen: NAD, alert, cooperative with exam HEENT: NCAT, EOMI, PERRL CV: RRR, good S1/S2, no murmur Resp: CTABL, no wheezes, non-labored Abd: SNTND, BS present, no guarding or organomegaly Ext: No edema, warm, L below knee amputation, R foot with great and 2nd toe amputation with no lesions.   L hand with 3rd finger amputation at MCP with erythema and hemecrust on the nub, 4th finger with erythema and hem crusting and amputaion at PIP.   R hand with 3rd finger amputation at MCP with heme crusting and no erythema.  Neuro: Alert and oriented to person, place and year of 2014, sensation intact on all four extremities and moves all four extremities freely.    I have reviewed the patient's medications, labs, imaging, and diagnostic testing.  Notable results are summarized below.  CBC BMET   Lab 03/20/12 0630 03/19/12 1510  WBC 6.6 6.2  HGB 12.0 14.0  HCT 37.4 42.7  PLT 117* 107*    Lab 03/20/12 0630 03/19/12 1510  NA 138 138  K 5.1 5.0  CL 96 96  CO2 26 25  BUN 37* 30*  CREATININE 8.14* 7.46*  GLUCOSE 62* 104*  CALCIUM 9.7 9.9     Imaging/Diagnostic Tests: CT Head w/o 11/25 IMPRESSION:  1. Stable atrophy and white matter disease.  2. No acute intracranial abnormality.  3. Interval  clearing of left sphenoid sinus disease.  4. New bilateral mastoid effusions.  CXR 03/19/2012 IMPRESSION:  No acute cardiopulmonary abnormality seen.   Plan: [name] is 51 y.o. caucasian female with ESRD, DM, HTN, osteomyelitis, h/o PEA (September) that reportedly had seizure-like activity during dialysis on 11/25.   Syncope, AMS, Suspicion of Seizure - No further events, going to dialysis currently - Will contact Dialyisis center to hear what happened - Seizure vs syncope vs ACS vs Hepatic encephalopathy vs Stroke vs pseudoseizure - Exam non-focal and negative head CT so CVA not likely, LFTs and ammonia WNL so hepatic encephalopathy not likely - consider neuro consult for EEG if happens again - With recent PEA, cardiac etiology is highly likely, will consult [facility] to eval  - Troponin neg times 3 and no EKG changes from previous rules out ACS but would like their aopinion with recent PEA.   ESRD:  - No IVF, Monitor BMP closely  - Renal on board and HD today   Hx of PEA in September - Due at that time to sepsis per [place] cardiology note.  - Echo from 9/29 with EF of 60-65% and no mention of diastolic dysf.  - monitor on tele  Diabetes:  - CBGs 62-108 - SSI   Decubitus ulcer - Wound Care consulted   HTN:  - 77-155/50-125 - Cont Lopressor, holding labetalol  Osteomyelitis:  - admitted  in September for osteo related sepsis, treated with IV Invanz that has been continued until current admission - Stopped Iv antibiotics - Fingers erythematous as above, no fever, no elevated WBC - Consider contacting Dr. Lajoyce Corners who has seen her for osteo in the past   FEN/GI: Saline lock IV, Renal diet PPx: sub q heparin Dispo: tele for now, home(?) pending clinical improvement and no more episodes, will contact dialysis center and husband to clarify home situation Code Status: Full for now, will clarify with husband  Kevin Fenton, MD 03/20/2012, 9:10 AM

## 2012-03-20 NOTE — Progress Notes (Signed)
Utilization review completed.  

## 2012-03-20 NOTE — Consult Note (Signed)
CARDIOLOGY CONSULT NOTE   Patient ID: [name] MRN: [id number] DOB/AGE: Nov 17, 1960 51 y.o.  Admit date: 03/19/2012  Primary Physician   DEFAULT,PROVIDER, MD Primary Cardiologist   PN  - saw in-hosp 9/13 Reason for Consultation   Possible syncope  WUJ:[id number] [name] is Bonnie 51 y.o. female with Bonnie history of PEA arrest 9/13 in the setting of sepsis but no CAD.  She had an episode of ?seizure activity/syncope during dialysis yesterday and was admitted. Cardiology was asked to evaluate her.  Spoke with Consulting civil engineer at dialysis center [facility]). Pt was hypotensive on HD, 89/42 but her BP is always up/down at HD. Pt was noted to left facial droop, then became unresponsive, then developed tonic/clonic activity (glucose 123). Pt was not incontinent. The tonic/clonic activity lasted 2-3 minutes. After the seizure activity, pt slowly returned to consciousness. Pt was able to ask staff to call her husband. Pt does not remember anything from yesterday. Currently, she complains of left arm pain from the dialysis site in her upper left arm down to her hand. This is worse with movement. She denies any recent history of chest pain. She denies palpitations or presyncope prior to admission. She became hypotensive today during dialysis but is currently in Trendelenburg with Bonnie systolic blood pressure approximately 100.   Past Medical History  Diagnosis Date  . Hypertension   . Anemia   . Diabetes mellitus   . Peripheral vascular disease   . Hemodialysis patient 03/15/11    "Tues; Thurs; Sat; [facility]"  . Blood transfusion   . Chronic back pain   . Rash 03/15/11    "admitted me to find out what its' from; UE, waist, thighs, groin"  . Renal failure     hd 4/12     Past Surgical History  Procedure Date  . Insertion of dialysis catheter 07/29/10    right subclavian  . Arteriovenous graft placement 10/01/10    left upper arm  . Below knee leg amputation 2004    left  .  Amputation 03/04/2011    Procedure: AMPUTATION DIGIT;  Surgeon: [name], MD;  Location: [facility] OR;  Service: Orthopedics;  Laterality: Right;  RT GREAT TOE AMP  . Cataract extraction w/ intraocular lens implant 12/2009    right eye  . Carpal tunnel release ~ 2009    left wrist  . Tee without cardioversion 03/18/2011    Procedure: TRANSESOPHAGEAL ECHOCARDIOGRAM (TEE);  Surgeon: [name], MD;  Location: [facility] ENDOSCOPY;  Service: Cardiovascular;  Laterality: N/Bonnie;    Allergies  Allergen Reactions  . Codeine   . Levofloxacin   . Penicillins     I have reviewed the patient's current medications    . heparin  5,000 Units Subcutaneous Q8H  . insulin aspart  0-10 Units Subcutaneous TID AC & HS  . metoCLOPramide  5 mg Oral TID AC & HS  . metoprolol tartrate  12.5 mg Oral BID  . pantoprazole  40 mg Oral BID  . senna  1 tablet Oral BID  . sertraline  100 mg Oral Daily  . sodium chloride  3 mL Intravenous Q12H     sodium chloride, sodium chloride, acetaminophen, acetaminophen, alteplase, calcium carbonate (dosed in mg elemental calcium), camphor-menthol, docusate sodium, feeding supplement (NEPRO CARB STEADY), heparin, heparin, hydrOXYzine, lidocaine, lidocaine-prilocaine, ondansetron (ZOFRAN) IV, ondansetron, pentafluoroprop-tetrafluoroeth, sorbitol, zolpidem, [DISCONTINUED] acetaminophen, [DISCONTINUED] acetaminophen, [DISCONTINUED] ondansetron (ZOFRAN) IV [DISCONTINUED] ondansetron, [DISCONTINUED] ondansetron  Medication Sig  Ertapenem Sodium (INVANZ IV) Inject 500  mg into the vein daily.  guaiFENesin-dextromethorphan (ROBITUSSIN DM) 100-10 MG/5ML syrup Take 30 mLs by mouth every 6 (six) hours as needed. For congestion  HYDROcodone-acetaminophen (NORCO/VICODIN) 5-325 MG per tablet Take 1 tablet by mouth every 4 (four) hours as needed. For pain  insulin aspart (NOVOLOG) 100 UNIT/ML injection Inject 0-10 Units into the skin 4 (four) times daily -  before meals and at bedtime. Sliding  scale >151-200=2 units; 201-250=4 units; 251-300=6 units; 301-350=8units; 351-400=10units; >400 notify MD  ipratropium-albuterol (DUONEB) 0.5-2.5 (3) MG/3ML SOLN Take 3 mLs by nebulization every 6 (six) hours.  labetalol (NORMODYNE) 100 MG tablet Take 50 mg by mouth 2 (two) times daily.  metoCLOPramide (REGLAN) 5 MG tablet Take 5 mg by mouth 4 (four) times daily.  metoprolol tartrate (LOPRESSOR) 25 MG tablet Take 12.5 mg by mouth 2 (two) times daily.  ondansetron (ZOFRAN) 4 MG tablet Take 4 mg by mouth every 6 (six) hours as needed. For nausea  pantoprazole (PROTONIX) 40 MG tablet Take 40 mg by mouth 2 (two) times daily.  sertraline (ZOLOFT) 100 MG tablet Take 100 mg by mouth daily.     History   Social History  . Marital Status: Married    Spouse Name: N/Bonnie    Number of Children: N/Bonnie  . Years of Education: N/Bonnie   Occupational History  .  disabled    Social History Main Topics  . Smoking status: Never Smoker   . Smokeless tobacco: Never Used  . Alcohol Use: No  . Drug Use: No  . Sexually Active: No   Other Topics Concern  . Not on file   Social History Narrative  .  Patient husband is primary caregiver. The patient not able to remember family information and the only information in records is that her mother had diabetes      ROS: Limited to 2 patient memory but, other than left arm pain, she has no current complaints. Review of systems complete and found to be negative unless listed above.  Physical Exam: Blood pressure 108/55, pulse 90, temperature 97.9 F (36.6 C), temperature source Oral, resp. rate 12, height 5\' 3"  (1.6 m), weight 130 lb 11.7 oz (59.3 kg), SpO2 97.00%.  General: Well developed, well nourished, female in mild/moderate distress Head: Eyes PERRLA, No xanthomas.   Normocephalic and atraumatic, oropharynx without edema or exudate. Dentition: poor Lungs: bilateral basilar rales Heart: HRRR S1 S2, no rub/gallop,  murmur. pulses are 2+ right upper extrem,  decreased left radial pulse but patient is currently undergoing dialysis and extremity is warm with good capillary refill. She is status post left BKA and right lower extremity has decreased pulses and slightly delayed capillary refill.   Neck: No carotid bruits. No lymphadenopathy.  JVD not elevated. Abdomen: Bowel sounds present but decreased, abdomen soft and non-tender without masses or hernias noted. Msk:  No joint effusions. Extremities: No clubbing or cyanosis. No edema. Right foot: amputations of toe, no skin breakdown; Left BKA. Amputation site intact. No skin breakdown; has had amputations of several fingers, sites are well-healed.  Neuro: Alert and oriented X 2. No focal deficits noted. Psych: responds appropriately Skin: No rashes or lesions noted.  Labs:   Lab Results  Component Value Date   WBC 6.6 03/20/2012   HGB 12.0 03/20/2012   HCT 37.4 03/20/2012   MCV 90.6 03/20/2012   PLT 117* 03/20/2012   No results found for this basename: INR in the last 72 hours  Lab 03/20/12 0630  NA 138  K 5.1  CL 96  CO2 26  BUN 37*  CREATININE 8.14*  CALCIUM 9.7  PROT 8.1  BILITOT 0.4  ALKPHOS 341*  ALT <5  AST 27  GLUCOSE 62*    Basename 03/20/12 0630 03/20/12 0123 03/19/12 1942 03/19/12 1510  CKTOTAL -- -- -- 46  CKMB -- -- -- --  TROPONINI <0.30 <0.30 <0.30 --   Echo: 01/22/2012 Study Conclusions - Left ventricle: The cavity size was normal. Wall thickness was normal. Systolic function was normal. The estimated ejection fraction was in the range of 60% to 65%. - Right ventricle: Difficult to see endocardium well in apical views. Overall RVEF appears moderately depressed. The cavity size was mildly dilated. - Pulmonary arteries: PA peak pressure: 50mm Hg (S).  ECG:  19-Mar-2012 16:07:43 [facility]/ED ROUTINE RECORD SINUS RHYTHM ~ normal P axis, V-rate 50- 99 BORDERLINE RIGHT AXIS DEVIATION ~ QRS axis ( 90, 99) NONSPECIFIC T ABNORMALITIES, DIFFUSE  LEADS ~ T <-0.89mV, ant/lat/inf Standard 12 Lead Report ~ Unconfirmed Interpretation Abnormal ECG 31mm/s 59mm/mV 150Hz  8.0.1 12SL 235 CID: [id number] Referred by: Unconfirmed Vent. rate 74 BPM PR interval 184 ms QRS duration 82 ms QT/QTc 412/457 ms P-R-T axes 69 91 228  Radiology:  Dg Chest 2 View 03/19/2012  *RADIOLOGY REPORT*  Clinical Data: Seizures.  CHEST - 2 VIEW  Comparison: February 25, 2012.  Findings: Cardiomediastinal silhouette appears normal.  No acute pulmonary disease is noted.  Right internal jugular catheter tip is noted in expected position of cavoatrial junction.  Bony thorax is intact.  IMPRESSION: No acute cardiopulmonary abnormality seen.   Original Report Authenticated By: Lupita Raider.,  M.D.    Ct Head Wo Contrast 03/19/2012  *RADIOLOGY REPORT*  Clinical Data: Seizure.  CT HEAD WITHOUT CONTRAST  Technique:  Contiguous axial images were obtained from the base of the skull through the vertex without contrast.  Comparison: CT head without contrast 01/22/2012.  Findings: Mild generalized atrophy is present.  Periventricular white matter hypoattenuation is similar to the prior study.  No acute cortical infarct, hemorrhage, or mass lesion is present. The ventricles are proportionate to the degree of atrophy.  No significant extra-axial fluid collection is present.  The paranasal sinuses are clear.  There is some fluid in the mastoid air cells bilaterally, worse on the left.  No obstructing nasopharyngeal lesion is evident.  Atherosclerotic calcifications are again noted within the cavernous carotid arteries bilaterally.  IMPRESSION:  1.  Stable atrophy and white matter disease. 2.  No acute intracranial abnormality. 3.  Interval clearing of left sphenoid sinus disease. 4.  New bilateral mastoid effusions.   Original Report Authenticated By: Marin Roberts, M.D.     ASSESSMENT AND PLAN:   The patient was seen today by Dr Antoine Poche, the patient evaluated and the data reviewed.   Altered mental status/ Seizure-like activity - telemetry reviewed and patient has maintained sinus rhythm without significant ectopy. Her cardiac enzymes are negative for MI. The seizure activity was preceded by left facial droop. She was also known to be hypotensive prior to the event. The patient had no complaints of chest pain, shortness of breath dizziness or presyncope. There is no report of any arrhythmia. Her EF was normal in September. M.D. advise further evaluation.  Otherwise, per primary care and nephrology.  Active Problems:  ESRD (end stage renal disease) on dialysis  Signed: Theodore Demark 03/20/2012, 10:46 AM Co-Sign MD  History and all data above reviewed.  Patient examined.  I agree with  the findings as above.  The patient does not recall the event.  The description is as above.  She does have Bonnie prolonged QT on EKG but no evidence of dysrhythmias since her previous hospitalization.  She was hypotensive and became hypotensive again today.  She denies any palpitations.  She has been basically in the bed accept for transfer to Bonnie chair since her last hospitalizationThe patient exam reveals COR:RRR  ,  Lungs: Decreased breath sounds at the bases  ,  Abd: Positive bowel sounds, no rebound no guarding, Ext No edema, left BKA  .  All available labs, radiology testing, previous records reviewed. Agree with documented assessment and plan. At this point I do not suspect Bonnie cardiac etiology to her event.  She was hypotensive which certainly could have contributed.  No further cardiac work up.    Fayrene Fearing Jayln Branscom  1:43 PM  03/20/2012

## 2012-03-20 NOTE — Progress Notes (Signed)
FMTS Attending Daily Note: Brentley Horrell MD [phone] pager office [phone] I have discussed this patient with the resident and reviewed the assessment and plan as documented above. I agree wit the resident's findings and plan.  

## 2012-03-20 NOTE — Progress Notes (Signed)
   CARE MANAGEMENT NOTE 03/20/2012  Patient:  [name], [name]   Account Number:  [id number]  Date Initiated:  03/20/2012  Documentation initiated by:  Darlyne Russian  Subjective/Objective Assessment:   Patient admitted with seizure     Action/Plan:   Progression of care and discharge planning.   Anticipated DC Date:     Anticipated DC Plan:    In-house referral  Clinical Social Worker      DC Planning Services  CM consult      Choice offered to / List presented to:             Status of service:  In process, will continue to follow Medicare Important Message given?   (If response is "NO", the following Medicare IM given date fields will be blank) Date Medicare IM given:   Date Additional Medicare IM given:    Discharge Disposition:    Per UR Regulation:    If discussed at Long Length of Stay Meetings, dates discussed:    Comments:  03/20/2012 [address] RN, [place] [phone] CM referral:   [facility]  Call to [facility] regarding referral.

## 2012-03-21 DIAGNOSIS — I10 Essential (primary) hypertension: Secondary | ICD-10-CM

## 2012-03-21 DIAGNOSIS — N186 End stage renal disease: Secondary | ICD-10-CM

## 2012-03-21 LAB — RENAL FUNCTION PANEL
CO2: 28 mEq/L (ref 19–32)
Calcium: 9.3 mg/dL (ref 8.4–10.5)
Creatinine, Ser: 5.22 mg/dL — ABNORMAL HIGH (ref 0.50–1.10)
Glucose, Bld: 91 mg/dL (ref 70–99)

## 2012-03-21 LAB — CBC
HCT: 35.6 % — ABNORMAL LOW (ref 36.0–46.0)
Hemoglobin: 11.5 g/dL — ABNORMAL LOW (ref 12.0–15.0)
RDW: 14.7 % (ref 11.5–15.5)
WBC: 6.7 10*3/uL (ref 4.0–10.5)

## 2012-03-21 LAB — GLUCOSE, CAPILLARY: Glucose-Capillary: 175 mg/dL — ABNORMAL HIGH (ref 70–99)

## 2012-03-21 LAB — CLOSTRIDIUM DIFFICILE BY PCR: Toxigenic C. Difficile by PCR: POSITIVE — AB

## 2012-03-21 MED ORDER — SODIUM CHLORIDE 0.9 % IJ SOLN
10.0000 mL | INTRAMUSCULAR | Status: DC | PRN
  Administered 2012-03-21: 10 mL

## 2012-03-21 MED ORDER — RENA-VITE PO TABS: 1.0000 | ORAL_TABLET | Freq: Every day | ORAL | Status: DC

## 2012-03-21 MED ORDER — DOXYCYCLINE HYCLATE 100 MG PO TABS
100.0000 mg | ORAL_TABLET | Freq: Two times a day (BID) | ORAL | Status: DC
  Administered 2012-03-21: 100 mg via ORAL
  Filled 2012-03-21 (×3): qty 1

## 2012-03-21 MED ORDER — METRONIDAZOLE 500 MG PO TABS: 500.0000 mg | ORAL_TABLET | Freq: Three times a day (TID) | ORAL | Status: DC

## 2012-03-21 MED ORDER — LANTHANUM CARBONATE 1000 MG PO CHEW: 1000.0000 mg | CHEWABLE_TABLET | Freq: Three times a day (TID) | ORAL | Status: DC

## 2012-03-21 MED ORDER — SODIUM CHLORIDE 0.9 % IJ SOLN: 10.0000 mL | Freq: Two times a day (BID) | INTRAMUSCULAR | Status: DC

## 2012-03-21 MED ORDER — METRONIDAZOLE 500 MG PO TABS
500.0000 mg | ORAL_TABLET | Freq: Three times a day (TID) | ORAL | Status: DC
  Filled 2012-03-21 (×4): qty 1

## 2012-03-21 MED ORDER — DOXYCYCLINE HYCLATE 100 MG PO TABS: 100.0000 mg | ORAL_TABLET | Freq: Two times a day (BID) | ORAL | Status: DC

## 2012-03-21 NOTE — Progress Notes (Signed)
CRITICAL VALUE ALERT  Critical value received:  cdiff pcr    Date of notification:  03/21/2012      Time of notification:  1156 Critical value read back:yes  Nurse who received alert:  Melina Schools, RN  MD notified (1st page): Ermalinda Memos  Time of first page: 1157  MD notified (2nd page):  Time of second page:  Responding MD:  Ali Lowe   Time MD responded:  1201

## 2012-03-21 NOTE — Discharge Summary (Signed)
[facility] Discharge Summary  Patient name: [name] Medical record number: [id number] Date of birth: 01-21-1961 Age: 51 y.o. Gender: female Date of Admission: 03/19/2012  Date of Discharge: 03/21/2012 Admitting Physician: Carney Living, MD  Primary Care Provider: Sheila Oats, MD  Indication for Hospitalization: Altered mental status, seizure activity Discharge Diagnoses:  1. Syncope 2. ESRD 3. DM2 4. C. Diff positive diarrhea 5. Hx of PEA 6. Decubitus Ulcer 7. HTN 8. Osteomyelitis   Consultations:  - Cardiology- Caryl Ada - Renal- [facility], [facility]le - Orthopedic Surgery - Dr. Lajoyce Corners with [facility] orthopedics.  - Wound care  Significant Labs and Imaging:  ALT <5 AST 27 Ammonia 21  BMET    Component Value Date/Time   NA 137 03/21/2012 0524   K 3.6 03/21/2012 0524   CL 98 03/21/2012 0524   CO2 28 03/21/2012 0524   GLUCOSE 91 03/21/2012 0524   BUN 22 03/21/2012 0524   CREATININE 5.22* 03/21/2012 0524   CALCIUM 9.3 03/21/2012 0524   CALCIUM 8.0* 03/02/2009 1350   GFRNONAA 9* 03/21/2012 0524   GFRAA 10* 03/21/2012 0524    CBC    Component Value Date/Time   WBC 6.7 03/21/2012 0524   RBC 3.90 03/21/2012 0524   HGB 11.5* 03/21/2012 0524   HCT 35.6* 03/21/2012 0524   PLT 95* 03/21/2012 0524   MCV 91.3 03/21/2012 0524   MCH 29.5 03/21/2012 0524   MCHC 32.3 03/21/2012 0524   RDW 14.7 03/21/2012 0524   LYMPHSABS 1.1 03/19/2012 1510   MONOABS 0.5 03/19/2012 1510   EOSABS 0.4 03/19/2012 1510   BASOSABS 0.1 03/19/2012 1510   CT Head w/o 11/25  IMPRESSION:  1. Stable atrophy and white matter disease.  2. No acute intracranial abnormality.  3. Interval clearing of left sphenoid sinus disease.  4. New bilateral mastoid effusions.   CXR 03/19/2012  IMPRESSION:  No acute cardiopulmonary abnormality seen.  Procedures: Hemodialyses 03/20/2012  Brief Hospital Course: ] Plan:  [name] is 51 y.o. caucasian female with ESRD, DM, HTN, osteomyelitis, h/o PEA (September) that reportedly had seizure-like activity during dialysis on 11/25.   Syncope, AMS, Suspicion of Seizure  She presented to our ED via EMS after having L sided facial droop and tonic clonic activity during hemodialyses on 11/25. She was admitted, started on fluids, placed on telemetry, and observed. Initially we held one of her beta blockers as we could not clearly see a reason for dual beta blockade. We got a CT head which ruled out hemmorhagic stroke, LFTs and ammmonia were normal, making hepatic encephalopathy unlikely and we started ACS rule-out. Her troponin I's were negative times three, her EKG did not show any new changes, her Tele strip did not show any arrythmias, and cardiology examined her finding that her presentation could not be explained by cardiac source. Her exam was non-focal at the time of presentation and she was oriented to person, place, and time (year of 2014) which made ischemic stroke less likely also. It was noted that during HD her blood pressure dipped as low as 89/41which responded well to a 1L bolus during HD at which time her BP went back to the 150s systolic. She denied feelings of lightheadedness during HD. After our work up we feel that HD related hypotension is the most likely explanation for her symptoms that brought her to the hospital.   ESRD:  She was treated with HD per nephrology while inpatient.   C. Diff  positive diarrhea  She was noted to have foul smelling diarrhea and a C. Diff PCR was found to be positive. We started her on treatment for it which is Flagyl PO 500 TID for 14 days.   Hx of PEA in September She was evaluated by cardiology and had no arrythmias on telemetry throughout her admission. It was due to sepsis back in September when it happened.   Diabetes:  She was controlled on SSI throughout her admission  Decubitus ulcer  Wound  Care was consulted who  found no open lesions and felt that the areas were due to incontinence and chronic moisture complicated by bowel incontinence. They recommend barrier cream daily and after each BM.   HTN:  Her BP was labile throughout admission. At times as high as 160/72 and other very low as described above. We continued her lopressor at 12.5 BID but discontinued her labetalol. Her hypotension responded well to 1 L fluid bolus as above.   Osteomyelitis:  She had  A recent admission for osteo related sepsis in September. She was treated with IV invanz at that time and appears to have been continued on it until currently. It was discontinued at admission and her fingers examined by Dr. Lajoyce Corners, orthopedic surgeon, who felt that her finger were most consistent with ischemic changes and that PO doxycycline would be adequate treatment.He would like to f/u with her in 2 weeks as scheduled below. Dr. Lajoyce Corners reccomends 4 weeks of doxycycline to begin with and will re-evaluate her as described.   Dispo: SNF- [facility], f/u with Ortho in two weeks.   Discharge Medications:    Medication List     As of 03/21/2012  1:44 PM    STOP taking these medications         INVANZ IV      labetalol 100 MG tablet   Commonly known as: NORMODYNE      TAKE these medications         doxycycline 100 MG tablet   Commonly known as: VIBRA-TABS   Take 1 tablet (100 mg total) by mouth every 12 (twelve) hours.      guaiFENesin-dextromethorphan 100-10 MG/5ML syrup   Commonly known as: ROBITUSSIN DM   Take 30 mLs by mouth every 6 (six) hours as needed. For congestion      HYDROcodone-acetaminophen 5-325 MG per tablet   Commonly known as: NORCO/VICODIN   Take 1 tablet by mouth every 4 (four) hours as needed. For pain      insulin aspart 100 UNIT/ML injection   Commonly known as: novoLOG   Inject 0-10 Units into the skin 4 (four) times daily -  before meals and at bedtime. Sliding scale >151-200=2 units; 201-250=4 units;  251-300=6 units; 301-350=8units; 351-400=10units; >400 notify MD      ipratropium-albuterol 0.5-2.5 (3) MG/3ML Soln   Commonly known as: DUONEB   Take 3 mLs by nebulization every 6 (six) hours.      lanthanum 1000 MG chewable tablet   Commonly known as: FOSRENOL   Chew 1 tablet (1,000 mg total) by mouth 3 (three) times daily with meals.      metoCLOPramide 5 MG tablet   Commonly known as: REGLAN   Take 5 mg by mouth 4 (four) times daily.      metoprolol tartrate 25 MG tablet   Commonly known as: LOPRESSOR   Take 12.5 mg by mouth 2 (two) times daily.      metroNIDAZOLE 500 MG tablet   Commonly  known as: FLAGYL   Take 1 tablet (500 mg total) by mouth every 8 (eight) hours. Last dose 04/04/2012      multivitamin Tabs tablet   Take 1 tablet by mouth at bedtime.      ondansetron 4 MG tablet   Commonly known as: ZOFRAN   Take 4 mg by mouth every 6 (six) hours as needed. For nausea      pantoprazole 40 MG tablet   Commonly known as: PROTONIX   Take 40 mg by mouth 2 (two) times daily.      sertraline 100 MG tablet   Commonly known as: ZOLOFT   Take 100 mg by mouth daily.         Issues for Follow Up:  - re-evaluate HTN as we stopped one of her two BB during this admission - Ensure f/u with Dr. Lajoyce Corners, orthopedic surgeon, for continued management of hand wounds.  - continue Flagyl for C. Diff + diarrhea for 14 days  Outstanding Results: Blood cultures X 2  Discharge Instructions: Please refer to Patient Instructions section of EMR for full details.  Patient was counseled important signs and symptoms that should prompt return to medical care, changes in medications, dietary instructions, activity restrictions, and follow up appointments.   Follow-up Information    Follow up with [name], MD. On 04/03/2012. (Scheduled at 3:15)    Contact information:   [address] Raelyn Number [place] [address] [phone]          Discharge Condition: Carlena Sax,  MD 03/21/2012, 1:44 PM

## 2012-03-21 NOTE — Progress Notes (Addendum)
Report given to [facility] at [facility]. Per Loree RN's request, leave the PICC line intact with patient and will address and reassess need for PICC line in 24 hours. M.D [facility] notified and made aware. Agrees with decision to leave the PICC line intact. Gilman Schmidt

## 2012-03-21 NOTE — Progress Notes (Signed)
TELEMETRY: Reviewed telemetry pt in NSR: Filed Vitals:   03/20/12 1800 03/20/12 2042 03/20/12 2043 03/21/12 0446  BP: 139/78  138/80 153/75  Pulse: 102  99 92  Temp: 97.9 F (36.6 C)  99 F (37.2 C) 98.1 F (36.7 C)  TempSrc: Oral  Oral Oral  Resp: 18  18 18   Height:      Weight:  58.4 kg (128 lb 12 oz)    SpO2: 93%  96% 96%    Intake/Output Summary (Last 24 hours) at 03/21/12 0840 Last data filed at 03/21/12 0700  Gross per 24 hour  Intake    240 ml  Output   -832 ml  Net   1072 ml    SUBJECTIVE Feels OK. Denies any chest pain or SOB. No further spells.  LABS: Basic Metabolic Panel:  Basename 03/21/12 0524 03/20/12 0630  NA 137 138  K 3.6 5.1  CL 98 96  CO2 28 26  GLUCOSE 91 62*  BUN 22 37*  CREATININE 5.22* 8.14*  CALCIUM 9.3 9.7  MG -- --  PHOS 3.8 --   Liver Function Tests:  Basename 03/21/12 0524 03/20/12 0630 03/19/12 1942  AST -- 27 32  ALT -- <5 <5  ALKPHOS -- 341* 363*  BILITOT -- 0.4 0.4  PROT -- 8.1 8.3  ALBUMIN 2.8* 3.1* --   No results found for this basename: LIPASE:2,AMYLASE:2 in the last 72 hours CBC:  Basename 03/21/12 0524 03/20/12 0630 03/19/12 1510  WBC 6.7 6.6 --  NEUTROABS -- -- 4.1  HGB 11.5* 12.0 --  HCT 35.6* 37.4 --  MCV 91.3 90.6 --  PLT 95* 117* --   Cardiac Enzymes:  Basename 03/20/12 0630 03/20/12 0123 03/19/12 1942 03/19/12 1510  CKTOTAL -- -- -- 46  CKMB -- -- -- --  CKMBINDEX -- -- -- --  TROPONINI <0.30 <0.30 <0.30 --    Radiology/Studies:  Dg Chest 2 View  03/19/2012  *RADIOLOGY REPORT*  Clinical Data: Seizures.  CHEST - 2 VIEW  Comparison: February 25, 2012.  Findings: Cardiomediastinal silhouette appears normal.  No acute pulmonary disease is noted.  Right internal jugular catheter tip is noted in expected position of cavoatrial junction.  Bony thorax is intact.  IMPRESSION: No acute cardiopulmonary abnormality seen.   Original Report Authenticated By: Lupita Raider.,  M.D.    Dg Chest 2  View  02/25/2012  *RADIOLOGY REPORT*  Clinical Data: Confusion and weakness  CHEST - 2 VIEW  Comparison: February 20, 2012  Findings:  The left lung is now clear.  Patchy pneumonitis remains in the right base, stable.  There is no new opacity.  Heart size and pulmonary vascularity are normal.  No adenopathy.  No bone lesions.  IMPRESSION: Persistent patchy infiltrate right base.  Left lung now clear.  No new opacity.   Original Report Authenticated By: Bretta Bang, M.D.    Ct Head Wo Contrast  03/19/2012  *RADIOLOGY REPORT*  Clinical Data: Seizure.  CT HEAD WITHOUT CONTRAST  Technique:  Contiguous axial images were obtained from the base of the skull through the vertex without contrast.  Comparison: CT head without contrast 01/22/2012.  Findings: Mild generalized atrophy is present.  Periventricular white matter hypoattenuation is similar to the prior study.  No acute cortical infarct, hemorrhage, or mass lesion is present. The ventricles are proportionate to the degree of atrophy.  No significant extra-axial fluid collection is present.  The paranasal sinuses are clear.  There is some fluid in the mastoid  air cells bilaterally, worse on the left.  No obstructing nasopharyngeal lesion is evident.  Atherosclerotic calcifications are again noted within the cavernous carotid arteries bilaterally.  IMPRESSION:  1.  Stable atrophy and white matter disease. 2.  No acute intracranial abnormality. 3.  Interval clearing of left sphenoid sinus disease. 4.  New bilateral mastoid effusions.   Original Report Authenticated By: Marin Roberts, M.D.    Ir Fluoro Guide Cv Line Right  03/13/2012  *RADIOLOGY REPORT*  Indication: Poor venous access, end stage renal disease, on hemodialysis; in need of intravenous access for antibiotic administration  TUNNELED PICC LINE PLACEMENT WITH ULTRASOUND AND FLUOROSCOPIC GUIDANCE  Intravenous Medications: None  Contrast: None  Fluoroscopy Time: 2.4 minutes.  Complications:   None immediate  Findings / Procedure:  Informed written consent was obtained from the patient husband after a discussion of the risks, benefits, and alternatives to treatment.  Questions regarding the procedure were encouraged and answered.  The right neck and chest were prepped with chlorhexidine in a sterile fashion, and a sterile drape was applied covering the operative field.  Maximum barrier sterile technique with sterile gowns and gloves were used for the procedure.  A timeout was performed prior to the initiation of the procedure.  After creating a small venotomy incision, a micropuncture kit was utilized to access the right internal jugular vein under direct, real-time ultrasound guidance after the overlying soft tissues were anesthetized with 1% lidocaine with epinephrine.  Ultrasound image documentation was performed.  The microwire was kinked to measure appropriate catheter length.   The micropuncture sheath was exchanged for a peel-away sheath over a guidewire.  A 21 cm single lumen tunneled PICC catheter was tunneled in a retrograde fashion from the anterior chest wall to the venotomy incision.  The catheter was then placed through the peel-away sheath with tip ultimately positioned at the superior cavoatrial junction.  Final catheter positioning was confirmed and documented with a spot radiographic image.  The catheter aspirates and flushes normally. The catheter was flushed with appropriate volume heparin dwells.  The catheter exit site was secured with a 0-Prolene retention suture.  The venotomy incision was closed with Dermabond and Steri- strips.  Dressings were applied.  The patient tolerated the procedure well without immediate post procedural complication.  IMPRESSION:  Successful placement of tunneled 21 cm single-lumen PICC line via the right internal jugular vein with tip terminating within the superior cavoatrial junction.  The catheter is ready for immediate use.   Original Report  Authenticated By: Tacey Ruiz, MD    Ir US Guide Vasc Access Right  03/13/2012  *RADIOLOGY REPORT*  Indication: Poor venous access, end stage renal disease, on hemodialysis; in need of intravenous access for antibiotic administration  TUNNELED PICC LINE PLACEMENT WITH ULTRASOUND AND FLUOROSCOPIC GUIDANCE  Intravenous Medications: None  Contrast: None  Fluoroscopy Time: 2.4 minutes.  Complications:  None immediate  Findings / Procedure:  Informed written consent was obtained from the patient husband after a discussion of the risks, benefits, and alternatives to treatment.  Questions regarding the procedure were encouraged and answered.  The right neck and chest were prepped with chlorhexidine in a sterile fashion, and a sterile drape was applied covering the operative field.  Maximum barrier sterile technique with sterile gowns and gloves were used for the procedure.  A timeout was performed prior to the initiation of the procedure.  After creating a small venotomy incision, a micropuncture kit was utilized to access the right internal jugular vein under  direct, real-time ultrasound guidance after the overlying soft tissues were anesthetized with 1% lidocaine with epinephrine.  Ultrasound image documentation was performed.  The microwire was kinked to measure appropriate catheter length.   The micropuncture sheath was exchanged for a peel-away sheath over a guidewire.  A 21 cm single lumen tunneled PICC catheter was tunneled in a retrograde fashion from the anterior chest wall to the venotomy incision.  The catheter was then placed through the peel-away sheath with tip ultimately positioned at the superior cavoatrial junction.  Final catheter positioning was confirmed and documented with a spot radiographic image.  The catheter aspirates and flushes normally. The catheter was flushed with appropriate volume heparin dwells.  The catheter exit site was secured with a 0-Prolene retention suture.  The venotomy  incision was closed with Dermabond and Steri- strips.  Dressings were applied.  The patient tolerated the procedure well without immediate post procedural complication.  IMPRESSION:  Successful placement of tunneled 21 cm single-lumen PICC line via the right internal jugular vein with tip terminating within the superior cavoatrial junction.  The catheter is ready for immediate use.   Original Report Authenticated By: Tacey Ruiz, MD     PHYSICAL EXAM General: Well developed, well nourished, in no acute distress. Head: Normal Neck: Negative for carotid bruits. JVD not elevated. Lungs: Mild basilar rales Heart: RRR S1 S2 without murmurs, rubs, or gallops.  Abdomen: Soft, non-tender, non-distended with normoactive bowel sounds. No hepatomegaly. No rebound/guarding. No obvious abdominal masses. Extremities: L BKA, right leg pulses poor. Neuro: Alert and oriented X 3. Moves all extremities spontaneously. Psych:  Responds to questions appropriately with a normal affect.  ASSESSMENT AND PLAN: 1. Altered mental status/ seizure. Probably related to hypotension on dialysis. BP improved now. No arrhythmias. Normal EF by Echo in September. I don't think recent event cardiac related other than low BP. Will sign off now. Please call with further questions.   Active Problems:  ESRD (end stage renal disease) on dialysis  Altered mental status  Seizure-like activity    Signed, Peter [place] MD,FACC 03/21/2012 8:46 AM

## 2012-03-21 NOTE — Procedures (Signed)
Stable hemodynamics.  Alert but flat affect.  Arouses and appropriate.  Skin turgor improved but still a little diminished.  Using left arm AV access.  EDW 62 as out patient, weighed 59.5 post HD yesterday. Will keep even. Genevra Orne C

## 2012-03-21 NOTE — Progress Notes (Signed)
Patients skin is clean, dry and intact, no evidence of skin break down. IV site discontinued and catheter remains intact. Site without signs and symptoms of complications. Dressing and pressure applied.  Patient escorted to ambulance in a gurney with husband and transporter,  no distress noted upon discharge. Pt being transferred to [facility] care.  Gilman Schmidt 03/21/2012 6:49 PM

## 2012-03-21 NOTE — Consult Note (Signed)
RI have reviewed the patient's current medications.eason for Consult: Left hand ischemic changes  Referring Physician: Deshanti [name] is an 51 y.o. female.  HPI: Patient is a 51 year old woman with diabetes hypertension peripheral vascular disease hemodialysis who is status post a multiple amputations presents at this time with ischemic changes to the fingers on the left hand.  Past Medical History  Diagnosis Date  . Hypertension   . Anemia   . Diabetes mellitus   . Peripheral vascular disease   . Hemodialysis patient 03/15/11    "Tues; Thurs; Sat; [facility]"  . Blood transfusion   . Chronic back pain   . Rash 03/15/11    "admitted me to find out what its' from; UE, waist, thighs, groin"  . Renal failure     hd 4/12    Past Surgical History  Procedure Date  . Insertion of dialysis catheter 07/29/10    right subclavian  . Arteriovenous graft placement 10/01/10    left upper arm  . Below knee leg amputation 2004    left  . Amputation 03/04/2011    Procedure: AMPUTATION DIGIT;  Surgeon: [name], MD;  Location: [facility] OR;  Service: Orthopedics;  Laterality: Right;  RT GREAT TOE AMP  . Cataract extraction w/ intraocular lens implant 12/2009    right eye  . Carpal tunnel release ~ 2009    left wrist  . Tee without cardioversion 03/18/2011    Procedure: TRANSESOPHAGEAL ECHOCARDIOGRAM (TEE);  Surgeon: [name], MD;  [facility] ENDOSCOPY;  Service: Cardiovascular;  Laterality: N/A;    History reviewed. No pertinent family history.  Social History:  reports that she has never smoked. She has never used smokeless tobacco. She reports that she does not drink alcohol or use illicit drugs.  Allergies:  Allergies  Allergen Reactions  . Codeine   . Levofloxacin   . Penicillins     Medications: I have reviewed the patient's current medications.  Results for orders placed during the hospital encounter of 03/19/12 (from the past 48 hour(s))    BASIC METABOLIC PANEL     Status: Abnormal   Collection Time   03/19/12  3:10 PM      Component Value Range Comment   Sodium 138  135 - 145 mEq/L    Potassium 5.0  3.5 - 5.1 mEq/L    Chloride 96  96 - 112 mEq/L    CO2 25  19 - 32 mEq/L    Glucose, Bld 104 (*) 70 - 99 mg/dL    BUN 30 (*) 6 - 23 mg/dL    Creatinine, Ser 1.61 (*) 0.50 - 1.10 mg/dL    Calcium 9.9  8.4 - 09.6 mg/dL    GFR calc non Af Amer 6 (*) >90 mL/min    GFR calc Af Amer 7 (*) >90 mL/min   CBC WITH DIFFERENTIAL     Status: Abnormal   Collection Time   03/19/12  3:10 PM      Component Value Range Comment   WBC 6.2  4.0 - 10.5 K/uL    RBC 4.72  3.87 - 5.11 MIL/uL    Hemoglobin 14.0  12.0 - 15.0 g/dL    HCT 04.5  40.9 - 81.1 %    MCV 90.5  78.0 - 100.0 fL    MCH 29.7  26.0 - 34.0 pg    MCHC 32.8  30.0 - 36.0 g/dL    RDW 91.4  78.2 - 95.6 %  Platelets 107 (*) 150 - 400 K/uL PLATELET COUNT CONFIRMED BY SMEAR   Neutrophils Relative 67  43 - 77 %    Neutro Abs 4.1  1.7 - 7.7 K/uL    Lymphocytes Relative 18  12 - 46 %    Lymphs Abs 1.1  0.7 - 4.0 K/uL    Monocytes Relative 8  3 - 12 %    Monocytes Absolute 0.5  0.1 - 1.0 K/uL    Eosinophils Relative 6 (*) 0 - 5 %    Eosinophils Absolute 0.4  0.0 - 0.7 K/uL    Basophils Relative 1  0 - 1 %    Basophils Absolute 0.1  0.0 - 0.1 K/uL   CK     Status: Normal   Collection Time   03/19/12  3:10 PM      Component Value Range Comment   Total CK 46  7 - 177 U/L   GLUCOSE, CAPILLARY     Status: Abnormal   Collection Time   03/19/12  4:05 PM      Component Value Range Comment   Glucose-Capillary 108 (*) 70 - 99 mg/dL   URINALYSIS, ROUTINE W REFLEX MICROSCOPIC     Status: Abnormal   Collection Time   03/19/12  5:14 PM      Component Value Range Comment   Color, Urine AMBER (*) YELLOW BIOCHEMICALS MAY BE AFFECTED BY COLOR   APPearance CLEAR  CLEAR    Specific Gravity, Urine 1.018  1.005 - 1.030    pH 7.5  5.0 - 8.0    Glucose, UA NEGATIVE  NEGATIVE mg/dL    Hgb  urine dipstick NEGATIVE  NEGATIVE    Bilirubin Urine SMALL (*) NEGATIVE    Ketones, ur NEGATIVE  NEGATIVE mg/dL    Protein, ur >621 (*) NEGATIVE mg/dL REPEATED TO VERIFY   Urobilinogen, UA 0.2  0.0 - 1.0 mg/dL    Nitrite NEGATIVE  NEGATIVE    Leukocytes, UA TRACE (*) NEGATIVE   URINE MICROSCOPIC-ADD ON     Status: Normal   Collection Time   03/19/12  5:14 PM      Component Value Range Comment   Squamous Epithelial / LPF RARE  RARE    WBC, UA 0-2  <3 WBC/hpf    Bacteria, UA RARE  RARE   HEPATIC FUNCTION PANEL     Status: Abnormal   Collection Time   03/19/12  7:42 PM      Component Value Range Comment   Total Protein 8.3  6.0 - 8.3 g/dL    Albumin 3.1 (*) 3.5 - 5.2 g/dL    AST 32  0 - 37 U/L    ALT <5  0 - 35 U/L    Alkaline Phosphatase 363 (*) 39 - 117 U/L    Total Bilirubin 0.4  0.3 - 1.2 mg/dL    Bilirubin, Direct 0.2  0.0 - 0.3 mg/dL HEMOLYSIS AT THIS LEVEL MAY AFFECT RESULT   Indirect Bilirubin 0.2 (*) 0.3 - 0.9 mg/dL   TROPONIN I     Status: Normal   Collection Time   03/19/12  7:42 PM      Component Value Range Comment   Troponin I <0.30  <0.30 ng/mL   AMMONIA     Status: Normal   Collection Time   03/19/12  7:43 PM      Component Value Range Comment   Ammonia 21  11 - 60 umol/L   GLUCOSE, CAPILLARY     Status:  Normal   Collection Time   03/20/12 12:32 AM      Component Value Range Comment   Glucose-Capillary 73  70 - 99 mg/dL    Comment 1 Documented in Chart      Comment 2 Notify RN     GLUCOSE, CAPILLARY     Status: Abnormal   Collection Time   03/20/12  1:21 AM      Component Value Range Comment   Glucose-Capillary 69 (*) 70 - 99 mg/dL   TROPONIN I     Status: Normal   Collection Time   03/20/12  1:23 AM      Component Value Range Comment   Troponin I <0.30  <0.30 ng/mL   TROPONIN I     Status: Normal   Collection Time   03/20/12  6:30 AM      Component Value Range Comment   Troponin I <0.30  <0.30 ng/mL   CBC     Status: Abnormal   Collection Time     03/20/12  6:30 AM      Component Value Range Comment   WBC 6.6  4.0 - 10.5 K/uL    RBC 4.13  3.87 - 5.11 MIL/uL    Hemoglobin 12.0  12.0 - 15.0 g/dL    HCT 56.2  13.0 - 86.5 %    MCV 90.6  78.0 - 100.0 fL    MCH 29.1  26.0 - 34.0 pg    MCHC 32.1  30.0 - 36.0 g/dL    RDW 78.4  69.6 - 29.5 %    Platelets 117 (*) 150 - 400 K/uL CONSISTENT WITH PREVIOUS RESULT  COMPREHENSIVE METABOLIC PANEL     Status: Abnormal   Collection Time   03/20/12  6:30 AM      Component Value Range Comment   Sodium 138  135 - 145 mEq/L    Potassium 5.1  3.5 - 5.1 mEq/L    Chloride 96  96 - 112 mEq/L    CO2 26  19 - 32 mEq/L    Glucose, Bld 62 (*) 70 - 99 mg/dL    BUN 37 (*) 6 - 23 mg/dL    Creatinine, Ser 2.84 (*) 0.50 - 1.10 mg/dL    Calcium 9.7  8.4 - 13.2 mg/dL    Total Protein 8.1  6.0 - 8.3 g/dL    Albumin 3.1 (*) 3.5 - 5.2 g/dL    AST 27  0 - 37 U/L    ALT <5  0 - 35 U/L REPEATED TO VERIFY   Alkaline Phosphatase 341 (*) 39 - 117 U/L    Total Bilirubin 0.4  0.3 - 1.2 mg/dL    GFR calc non Af Amer 5 (*) >90 mL/min    GFR calc Af Amer 6 (*) >90 mL/min   MRSA PCR SCREENING     Status: Normal   Collection Time   03/20/12 10:10 AM      Component Value Range Comment   MRSA by PCR NEGATIVE  NEGATIVE   GLUCOSE, CAPILLARY     Status: Abnormal   Collection Time   03/20/12 11:38 AM      Component Value Range Comment   Glucose-Capillary 69 (*) 70 - 99 mg/dL   GLUCOSE, CAPILLARY     Status: Abnormal   Collection Time   03/20/12 12:50 PM      Component Value Range Comment   Glucose-Capillary 121 (*) 70 - 99 mg/dL   CULTURE, BLOOD (ROUTINE X 2)  Status: Normal (Preliminary result)   Collection Time   03/20/12  1:22 PM      Component Value Range Comment   Specimen Description BLOOD FOREARM RIGHT      Special Requests BOTTLES DRAWN AEROBIC ONLY 8CC      Culture  Setup Time 03/20/2012 18:08      Culture        Value:        BLOOD CULTURE RECEIVED NO GROWTH TO DATE CULTURE WILL BE HELD FOR 5 DAYS  BEFORE ISSUING A FINAL NEGATIVE REPORT   Report Status PENDING     CULTURE, BLOOD (ROUTINE X 2)     Status: Normal (Preliminary result)   Collection Time   03/20/12  1:25 PM      Component Value Range Comment   Specimen Description BLOOD FINGER RIGHT      Special Requests BOTTLES DRAWN AEROBIC ONLY 8CC      Culture  Setup Time 03/20/2012 18:08      Culture        Value:        BLOOD CULTURE RECEIVED NO GROWTH TO DATE CULTURE WILL BE HELD FOR 5 DAYS BEFORE ISSUING A FINAL NEGATIVE REPORT   Report Status PENDING     GLUCOSE, CAPILLARY     Status: Normal   Collection Time   03/20/12  5:35 PM      Component Value Range Comment   Glucose-Capillary 91  70 - 99 mg/dL   GLUCOSE, CAPILLARY     Status: Abnormal   Collection Time   03/20/12  9:04 PM      Component Value Range Comment   Glucose-Capillary 121 (*) 70 - 99 mg/dL   RENAL FUNCTION PANEL     Status: Abnormal   Collection Time   03/21/12  5:24 AM      Component Value Range Comment   Sodium 137  135 - 145 mEq/L    Potassium 3.6  3.5 - 5.1 mEq/L    Chloride 98  96 - 112 mEq/L    CO2 28  19 - 32 mEq/L    Glucose, Bld 91  70 - 99 mg/dL    BUN 22  6 - 23 mg/dL DELTA CHECK NOTED   Creatinine, Ser 5.22 (*) 0.50 - 1.10 mg/dL DELTA CHECK NOTED   Calcium 9.3  8.4 - 10.5 mg/dL    Phosphorus 3.8  2.3 - 4.6 mg/dL    Albumin 2.8 (*) 3.5 - 5.2 g/dL    GFR calc non Af Amer 9 (*) >90 mL/min    GFR calc Af Amer 10 (*) >90 mL/min   CBC     Status: Abnormal   Collection Time   03/21/12  5:24 AM      Component Value Range Comment   WBC 6.7  4.0 - 10.5 K/uL    RBC 3.90  3.87 - 5.11 MIL/uL    Hemoglobin 11.5 (*) 12.0 - 15.0 g/dL    HCT 54.0 (*) 98.1 - 46.0 %    MCV 91.3  78.0 - 100.0 fL    MCH 29.5  26.0 - 34.0 pg    MCHC 32.3  30.0 - 36.0 g/dL    RDW 19.1  47.8 - 29.5 %    Platelets 95 (*) 150 - 400 K/uL CONSISTENT WITH PREVIOUS RESULT  GLUCOSE, CAPILLARY     Status: Abnormal   Collection Time   03/21/12  7:43 AM      Component Value  Range Comment  Glucose-Capillary 175 (*) 70 - 99 mg/dL    Comment 1 Documented in Chart      Comment 2 Notify RN       Dg Chest 2 View  03/19/2012  *RADIOLOGY REPORT*  Clinical Data: Seizures.  CHEST - 2 VIEW  Comparison: February 25, 2012.  Findings: Cardiomediastinal silhouette appears normal.  No acute pulmonary disease is noted.  Right internal jugular catheter tip is noted in expected position of cavoatrial junction.  Bony thorax is intact.  IMPRESSION: No acute cardiopulmonary abnormality seen.   Original Report Authenticated By: Lupita Raider.,  M.D.    Ct Head Wo Contrast  03/19/2012  *RADIOLOGY REPORT*  Clinical Data: Seizure.  CT HEAD WITHOUT CONTRAST  Technique:  Contiguous axial images were obtained from the base of the skull through the vertex without contrast.  Comparison: CT head without contrast 01/22/2012.  Findings: Mild generalized atrophy is present.  Periventricular white matter hypoattenuation is similar to the prior study.  No acute cortical infarct, hemorrhage, or mass lesion is present. The ventricles are proportionate to the degree of atrophy.  No significant extra-axial fluid collection is present.  The paranasal sinuses are clear.  There is some fluid in the mastoid air cells bilaterally, worse on the left.  No obstructing nasopharyngeal lesion is evident.  Atherosclerotic calcifications are again noted within the cavernous carotid arteries bilaterally.  IMPRESSION:  1.  Stable atrophy and white matter disease. 2.  No acute intracranial abnormality. 3.  Interval clearing of left sphenoid sinus disease. 4.  New bilateral mastoid effusions.   Original Report Authenticated By: Marin Roberts, M.D.     Review of Systems  All other systems reviewed and are negative.   Blood pressure 153/75, pulse 92, temperature 98.1 F (36.7 C), temperature source Oral, resp. rate 18, height 5\' 3"  (1.6 m), weight 58.4 kg (128 lb 12 oz), SpO2 96.00%. Physical Exam On examination  patient has several toes amputated on the right foot the foot is stable no cellulitis no open wounds. She has a stable left transtibial amputation. Examination of right hand she has several digits amputated which are stable no cellulitis. Examination the left hand she has black ischemic changes dorsally to the residual fingers. There is redness there is thin atrophic changes but there is no sausage digit swelling no signs of abscess she has flexion contracture of the fingers. Assessment/Plan: Assessment ischemic changes with black dry eschar fingers left hand.  Plan: Would recommend discharged on by mouth doxycycline. There is no signs of abscess patient's ischemic changes are stable. Feel that I can follow this as an outpatient.  [name] V 03/21/2012, 8:12 AM

## 2012-03-21 NOTE — Progress Notes (Signed)
Chaplain responded to request for spiritual care consult. Visited with pt while she was having hemodialysis in [facility]. Pt said she lives in a retirement home and routinely has dialysis 3X per week at another facility. She was unsure why she was having dialysis at [facility] today. After checking with nurse, I explained to her that she had had a seizure on Monday during her dialysis treatment at the other facility, and she was brought to [facility] at that time. Pt did not seem to realize that she had been here at [facility] for the past two days. Pt observed that her own mother had had seizures for many years prior to her death, and pt expressed concern that she might be inheriting this illness from her mom.

## 2012-03-21 NOTE — Discharge Summary (Signed)
Family Medicine Teaching Service  Discharge Note : Attending Iliana Hutt MD Pager [phone] Office [phone] I have seen and examined this patient, reviewed their chart and discussed discharge planning wit the resident at the time of discharge. I agree with the discharge plan as above.  

## 2012-03-21 NOTE — Progress Notes (Signed)
FMTS Attending Daily Note: Denny Levy MD [phone] pager office [phone] I  have seen and examined this patient, reviewed their chart. I have discussed this patient with the resident. I agree with the resident's findings, assessment and care plan. Likely her "episode" during dialysis was related to hypotension. Agree with stopping one of her TWO beta blockers, unknown why she was on two different.  Rest of cardiac work up to date has been negative.

## 2012-03-21 NOTE — Progress Notes (Signed)
Family Medicine Teaching Service Daily Progress Note Service Page: [phone]  Patient Assessment: 51 y.o. caucasian female with ESRD, DM, HTN, osteomyelitis, h/o PEA (September) that reportedly had seizure-like activity during dialysis on 11/25.   Subjective:  She's feeling in her usual health tiday. Denies any more episodes. Only having some pain in her L 4th finger. Has a good appetite. Denies lightheadedness before the event or during HD yesterday.   Per discussion with HD center staff where the event happened, she had L sided facial droop 15 min into HD followed by tonic clonic activity for minutes that was followed by a few minutes of unconsciousness.   Per her husband she lives in [facility] currently doing rehab. He's noticed the redness in her fingers starting two months ago. She see's Dr. Vear Clock in [place] as her PCP. No unusual symptoms lately.   Objective: Temp:  [97.9 F (36.6 C)-99 F (37.2 C)] 98.1 F (36.7 C) (11/27 0446) Pulse Rate:  [78-102] 92  (11/27 0446) Resp:  [12-27] 18  (11/27 0446) BP: (89-160)/(41-83) 153/75 mmHg (11/27 0446) SpO2:  [93 %-98 %] 96 % (11/27 0446) Weight:  [128 lb 12 oz (58.4 kg)-131 lb 2.8 oz (59.5 kg)] 128 lb 12 oz (58.4 kg) (11/26 2042)  Exam: Gen: NAD, alert, cooperative with exam HEENT: NCAT, EOMI, PERRL CV: RRR, good S1/S2, no murmur Resp: CTABL, no wheezes, non-labored Abd: SNTND, BS present, no guarding or organomegaly Ext: No edema, warm, L below knee amputation, R foot with great and 2nd toe amputation with no lesions.   L hand with 3rd finger amputation at MCP with erythema and hemecrust on the nub, 4th finger with erythema and hem crusting and amputaion at PIP.   R hand with 3rd finger amputation at MCP with heme crusting and no erythema.  Neuro: Alert and oriented to person, place and year of 2019, sensation intact on all four extremities and moves all four extremities freely.    I have reviewed the patient's  medications, labs, imaging, and diagnostic testing.  Notable results are summarized below.  CBC BMET   Lab 03/21/12 0524 03/20/12 0630 03/19/12 1510  WBC 6.7 6.6 6.2  HGB 11.5* 12.0 14.0  HCT 35.6* 37.4 42.7  PLT 95* 117* 107*    Lab 03/21/12 0524 03/20/12 0630 03/19/12 1510  NA 137 138 138  K 3.6 5.1 5.0  CL 98 96 96  CO2 28 26 25   BUN 22 37* 30*  CREATININE 5.22* 8.14* 7.46*  GLUCOSE 91 62* 104*  CALCIUM 9.3 9.7 9.9     Imaging/Diagnostic Tests: CT Head w/o 11/25 IMPRESSION:  1. Stable atrophy and white matter disease.  2. No acute intracranial abnormality.  3. Interval clearing of left sphenoid sinus disease.  4. New bilateral mastoid effusions.  CXR 03/19/2012 IMPRESSION:  No acute cardiopulmonary abnormality seen.   Plan: [name] is 51 y.o. caucasian female with ESRD, DM, HTN, osteomyelitis, h/o PEA (September) that reportedly had seizure-like activity during dialysis on 11/25.   Syncope, AMS, Suspicion of Seizure - Improved with no more events - Hypotension during dialyses, consider HD related hypotension as most likely etiology for syncopal epsiode. Responded well to 1 L bolus in HD on 11/26 - Exam non-focal and negative head CT so CVA not likely, LFTs and ammonia WNL so hepatic encephalopathy not likely - With recent PEA, [place] [facility] consulted- appreciate reccomendations  - No arrythmia on tele, MI ruled out with enzymes and no CP/dyspnea- no further work up needed  at this time - consider neuro consult for EEG if happens again - Consider reducing antihypertensives in light of recent hypotension  ESRD:  - No IVF, Lytes WNL - Renal on board and HD on 11/26, MWF schedule- recommendations appreciated  Hx of PEA in September - Due at that time to sepsis per [place] cardiology note.  - Echo from 9/29 with EF of 60-65% and no mention of diastolic dysf.  - Cardiology evaluated and recc no further workup currently - monitor on tele  Diabetes:  - CBGs  69-121 - SSI   Decubitus ulcer - Wound Care consulted - Barrier cream daily and after BMs   HTN:  - 89-160/41-72, Down to 89/41 during HD, low 100s over 40 and 50s after that.  - increased to 150's systolic with 1 L of fluid - Cont Lopressor for now , holding labetalol.   Osteomyelitis:  - admitted in September for osteo related sepsis, treated with IV Invanz that has been continued until current admission - per [facility] from [facility] she's on Invanz starting on 11/1 for 42 days - held - Fingers erythematous as above, no fever, no elevated WBC - Evaluated By Dr. Lajoyce Corners this am - Appreciate reccomendations  - reccomends 4 weeks PO doxycycline and f/u with him in 2 weeks PO Doxy started and will contact Dr. Audrie Lia office for f/u appt.    FEN/GI: Saline lock IV, mechanically soft with honey thick liquids per her diet at SNF.  PPx: sub q heparin Dispo: tele for now, back to SNF possibly today if bed available Code Status: Full   Kevin Fenton, MD 03/21/2012, 7:11 AM

## 2012-03-26 LAB — CULTURE, BLOOD (ROUTINE X 2): Culture: NO GROWTH

## 2012-03-26 LAB — GLUCOSE, CAPILLARY

## 2012-04-20 ENCOUNTER — Other Ambulatory Visit: Payer: Self-pay | Admitting: *Deleted

## 2012-04-20 DIAGNOSIS — T82598A Other mechanical complication of other cardiac and vascular devices and implants, initial encounter: Secondary | ICD-10-CM

## 2012-04-24 ENCOUNTER — Ambulatory Visit: Payer: Medicare Other | Admitting: Vascular Surgery

## 2012-04-30 ENCOUNTER — Encounter: Payer: Self-pay | Admitting: Vascular Surgery

## 2012-05-01 ENCOUNTER — Encounter: Payer: Self-pay | Admitting: Vascular Surgery

## 2012-05-01 ENCOUNTER — Encounter (INDEPENDENT_AMBULATORY_CARE_PROVIDER_SITE_OTHER): Payer: Medicare Other | Admitting: *Deleted

## 2012-05-01 ENCOUNTER — Ambulatory Visit (INDEPENDENT_AMBULATORY_CARE_PROVIDER_SITE_OTHER): Payer: Medicare Other | Admitting: Vascular Surgery

## 2012-05-01 VITALS — BP 144/81 | HR 90 | Resp 16 | Ht 63.0 in | Wt 130.0 lb

## 2012-05-01 DIAGNOSIS — T82598A Other mechanical complication of other cardiac and vascular devices and implants, initial encounter: Secondary | ICD-10-CM

## 2012-05-01 NOTE — Progress Notes (Signed)
Patient presents today for evaluation of difficult to access left upper arm AV fistula. The patient and her husband are very poor historians. Apparently there has been difficult cannulation. I do not how long this fistula is been present. Apparently she has had worked in both [place] and in [place]. She dialyzes at [facility] dialysis center.  Past Medical History  Diagnosis Date  . Hypertension   . Anemia   . Diabetes mellitus   . Peripheral vascular disease   . Hemodialysis patient 03/15/11    "Tues; Thurs; Sat; [facility]"  . Blood transfusion   . Chronic back pain   . Rash 03/15/11    "admitted me to find out what its' from; UE, waist, thighs, groin"  . Renal failure     hd 4/12    History  Substance Use Topics  . Smoking status: Never Smoker   . Smokeless tobacco: Never Used  . Alcohol Use: No    No family history on file.  Allergies  Allergen Reactions  . Codeine   . Levofloxacin   . Penicillins     Current outpatient prescriptions:guaiFENesin-dextromethorphan (ROBITUSSIN DM) 100-10 MG/5ML syrup, Take 30 mLs by mouth every 6 (six) hours as needed. For congestion, Disp: , Rfl: ;  HYDROcodone-acetaminophen (NORCO/VICODIN) 5-325 MG per tablet, Take 1 tablet by mouth every 4 (four) hours as needed. For pain, Disp: , Rfl:  insulin aspart (NOVOLOG) 100 UNIT/ML injection, Inject 0-10 Units into the skin 4 (four) times daily -  before meals and at bedtime. Sliding scale >151-200=2 units; 201-250=4 units; 251-300=6 units; 301-350=8units; 351-400=10units; >400 notify MD, Disp: , Rfl: ;  ipratropium-albuterol (DUONEB) 0.5-2.5 (3) MG/3ML SOLN, Take 3 mLs by nebulization every 6 (six) hours., Disp: , Rfl:  lanthanum (FOSRENOL) 1000 MG chewable tablet, Chew 1 tablet (1,000 mg total) by mouth 3 (three) times daily with meals., Disp: 180 tablet, Rfl: 0;  metoCLOPramide (REGLAN) 5 MG tablet, Take 5 mg by mouth 4 (four) times daily., Disp: , Rfl: ;  metoprolol  tartrate (LOPRESSOR) 25 MG tablet, Take 12.5 mg by mouth 2 (two) times daily., Disp: , Rfl: ;  multivitamin (RENA-VIT) TABS tablet, Take 1 tablet by mouth at bedtime., Disp: 30 tablet, Rfl: 0 pantoprazole (PROTONIX) 40 MG tablet, Take 40 mg by mouth 2 (two) times daily., Disp: , Rfl: ;  promethazine (PHENERGAN) 25 MG tablet, Take 25 mg by mouth every 6 (six) hours as needed., Disp: , Rfl: ;  sertraline (ZOLOFT) 100 MG tablet, Take 100 mg by mouth daily., Disp: , Rfl: ;  doxycycline (VIBRA-TABS) 100 MG tablet, Take 1 tablet (100 mg total) by mouth every 12 (twelve) hours., Disp: 55 tablet, Rfl: 0 metroNIDAZOLE (FLAGYL) 500 MG tablet, Take 1 tablet (500 mg total) by mouth every 8 (eight) hours. Last dose 04/04/2012, Disp: 31 tablet, Rfl: 0;  ondansetron (ZOFRAN) 4 MG tablet, Take 4 mg by mouth every 6 (six) hours as needed. For nausea, Disp: , Rfl:   BP 144/81  Pulse 90  Resp 16  Ht 5\' 3"  (1.6 m)  Wt 130 lb (58.968 kg)  BMI 23.03 kg/m2  Body mass index is 23.03 kg/(m^2).       Review of systems negative other than extensive past medical history.  Physical exam: A very frail-appearing white female in a wheelchair with dry dictation her lower extremity. She does have a recent right middle finger amputation which appears to be healing by secondary intention She has a Kerlix gauze on her left  hand. On removal of this dressing she has a completely paralyzed clawhand there is a full-thickness necrosis over her second third fourth and fifth fingers and also over the thenar prominence of her thumb. There is no evidence of invasive infection. Her upper arm fistula is patent but there does appear to be poor flow through this.  Noninvasive imaging of her left upper arm AV fistula reveals a probable valve near the arteriovenous anastomosis. There are markedly elevated flow is to this. The size of her vein distal this are good but diminished flow related to the proximal stenosis. She is also noted to have  extensive intensive calcification of her brachial and radial arteries.  Impression and plan: Poor function of her left upper arm AV fistula most likely related to a stenosis near the arterial venous anastomosis. I explained this could be treated with potential angioplasty. Of more concern is her necrotic hand. I discussed this with the patient and husband. I explained I do not see any option of the hand amputation. A comminuted she is being seen by hand surgeon in [place] Dr. Marcy Siren. I do not know this surgeon that we will attempt to arrange [facility] followup. She clearly has a nonsalvageable hand. I've also discussed this with Dr. Arrie Aran who follows her at the dialysis center. I would not do any intervention on her left arm fistula until her left hand necrosis is addressed.

## 2012-05-10 ENCOUNTER — Ambulatory Visit: Payer: Self-pay | Admitting: Specialist

## 2012-05-10 LAB — BASIC METABOLIC PANEL
Anion Gap: 7 (ref 7–16)
BUN: 22 mg/dL — ABNORMAL HIGH (ref 7–18)
Calcium, Total: 9.1 mg/dL (ref 8.5–10.1)
Chloride: 99 mmol/L (ref 98–107)
Co2: 32 mmol/L (ref 21–32)
Creatinine: 3.17 mg/dL — ABNORMAL HIGH (ref 0.60–1.30)
EGFR (African American): 19 — ABNORMAL LOW
Sodium: 138 mmol/L (ref 136–145)

## 2012-05-10 LAB — HEMOGLOBIN: HGB: 11.8 g/dL — ABNORMAL LOW (ref 12.0–16.0)

## 2012-05-25 ENCOUNTER — Inpatient Hospital Stay: Payer: Self-pay | Admitting: Specialist

## 2012-05-26 LAB — BASIC METABOLIC PANEL
BUN: 33 mg/dL — ABNORMAL HIGH (ref 7–18)
Calcium, Total: 8.6 mg/dL (ref 8.5–10.1)
Chloride: 101 mmol/L (ref 98–107)
Creatinine: 4.41 mg/dL — ABNORMAL HIGH (ref 0.60–1.30)
Glucose: 69 mg/dL (ref 65–99)
Potassium: 4.5 mmol/L (ref 3.5–5.1)
Sodium: 137 mmol/L (ref 136–145)

## 2012-05-26 LAB — PHOSPHORUS: Phosphorus: 4 mg/dL (ref 2.5–4.9)

## 2012-05-27 LAB — BASIC METABOLIC PANEL
BUN: 16 mg/dL (ref 7–18)
Calcium, Total: 8.2 mg/dL — ABNORMAL LOW (ref 8.5–10.1)
Chloride: 97 mmol/L — ABNORMAL LOW (ref 98–107)
Creatinine: 2.61 mg/dL — ABNORMAL HIGH (ref 0.60–1.30)
EGFR (African American): 24 — ABNORMAL LOW
EGFR (Non-African Amer.): 21 — ABNORMAL LOW
Glucose: 81 mg/dL (ref 65–99)
Osmolality: 272 (ref 275–301)
Potassium: 4 mmol/L (ref 3.5–5.1)

## 2012-05-30 LAB — PATHOLOGY REPORT

## 2012-07-13 ENCOUNTER — Emergency Department (HOSPITAL_COMMUNITY)
Admission: EM | Admit: 2012-07-13 | Discharge: 2012-07-14 | Disposition: A | Discharge status: Home / Self Care | Payer: Medicare PPO | Attending: Emergency Medicine | Admitting: Emergency Medicine

## 2012-07-13 ENCOUNTER — Encounter (HOSPITAL_COMMUNITY): Payer: Self-pay | Admitting: Emergency Medicine

## 2012-07-13 DIAGNOSIS — G8929 Other chronic pain: Secondary | ICD-10-CM | POA: Insufficient documentation

## 2012-07-13 DIAGNOSIS — Z862 Personal history of diseases of the blood and blood-forming organs and certain disorders involving the immune mechanism: Secondary | ICD-10-CM | POA: Insufficient documentation

## 2012-07-13 DIAGNOSIS — Z794 Long term (current) use of insulin: Secondary | ICD-10-CM | POA: Insufficient documentation

## 2012-07-13 DIAGNOSIS — H8110 Benign paroxysmal vertigo, unspecified ear: Secondary | ICD-10-CM | POA: Insufficient documentation

## 2012-07-13 DIAGNOSIS — I10 Essential (primary) hypertension: Secondary | ICD-10-CM | POA: Insufficient documentation

## 2012-07-13 DIAGNOSIS — E119 Type 2 diabetes mellitus without complications: Secondary | ICD-10-CM | POA: Insufficient documentation

## 2012-07-13 DIAGNOSIS — M549 Dorsalgia, unspecified: Secondary | ICD-10-CM | POA: Insufficient documentation

## 2012-07-13 DIAGNOSIS — Z8679 Personal history of other diseases of the circulatory system: Secondary | ICD-10-CM | POA: Insufficient documentation

## 2012-07-13 DIAGNOSIS — R112 Nausea with vomiting, unspecified: Secondary | ICD-10-CM | POA: Insufficient documentation

## 2012-07-13 DIAGNOSIS — Z992 Dependence on renal dialysis: Secondary | ICD-10-CM | POA: Insufficient documentation

## 2012-07-13 LAB — COMPREHENSIVE METABOLIC PANEL
AST: 20 U/L (ref 0–37)
Albumin: 3.2 g/dL — ABNORMAL LOW (ref 3.5–5.2)
Alkaline Phosphatase: 134 U/L — ABNORMAL HIGH (ref 39–117)
BUN: 16 mg/dL (ref 6–23)
Chloride: 97 mEq/L (ref 96–112)
Creatinine, Ser: 2.64 mg/dL — ABNORMAL HIGH (ref 0.50–1.10)
Potassium: 3.5 mEq/L (ref 3.5–5.1)
Total Bilirubin: 0.5 mg/dL (ref 0.3–1.2)
Total Protein: 8.3 g/dL (ref 6.0–8.3)

## 2012-07-13 LAB — CBC
Hemoglobin: 13 g/dL (ref 12.0–15.0)
Platelets: 166 10*3/uL (ref 150–400)
RBC: 4.13 MIL/uL (ref 3.87–5.11)

## 2012-07-13 LAB — PHOSPHORUS: Phosphorus: 2.8 mg/dL (ref 2.3–4.6)

## 2012-07-13 LAB — GLUCOSE, CAPILLARY: Glucose-Capillary: 123 mg/dL — ABNORMAL HIGH (ref 70–99)

## 2012-07-13 LAB — MAGNESIUM: Magnesium: 2.1 mg/dL (ref 1.5–2.5)

## 2012-07-13 NOTE — ED Notes (Signed)
Patient from [facility] per [facility] c/o nausea, vomiting, and dizziness. Per EMS patient was hypertensive with an initial BP of 210/83 and one 5 min later of 180/83.  Patient's BP is currently 161/100.  Patient vomiting at this time.  Will continue to monitor patient.

## 2012-07-13 NOTE — ED Notes (Signed)
Patient cold, clammy and shivering.  CBG 123.

## 2012-07-14 MED ORDER — MECLIZINE HCL 50 MG PO TABS
25.0000 mg | ORAL_TABLET | Freq: Three times a day (TID) | ORAL | Status: DC | PRN
Start: 2012-07-14 — End: 2013-11-15

## 2012-07-14 MED ORDER — MECLIZINE HCL 25 MG PO TABS
25.0000 mg | ORAL_TABLET | Freq: Once | ORAL | Status: AC
  Administered 2012-07-14: 25 mg via ORAL
  Filled 2012-07-14: qty 1

## 2012-07-14 MED ORDER — DIAZEPAM 2 MG PO TABS
2.0000 mg | ORAL_TABLET | Freq: Once | ORAL | Status: AC
  Administered 2012-07-14: 2 mg via ORAL
  Filled 2012-07-14: qty 1

## 2012-07-14 NOTE — ED Provider Notes (Signed)
History     CSN: [id number]  Arrival date & time 07/13/12  2211   First MD Initiated Contact with Patient 07/13/12 2255      Chief Complaint  Patient presents with  . Nausea  . Dizziness    (Consider location/radiation/quality/duration/timing/severity/associated sxs/prior treatment) HPI [name] is a 52 y.o. female arrives via [facility] complaints of nausea vomiting and dizziness-she clarifies this as the room spinning every time she turns over to the left. Patient just noticed this earlier this evening prior to calling EMS and prior to arrival. Patient has a pertinent medical history of diabetes, peripheral vascular disease, hypertension and renal failure currently on hemodialysis through a right upper chest port. Patient was dialyzed today. Patient says when she was laying in bed she turned over on her left-hand side and immediately got very dizzy, she describes a sensation of motion and became sick to her stomach and vomited. She says this has happened multiple times today. She says it is self-limited, and gets better by itself, there has been no headache. She denies headache, neck pain, chest pain, shortness of breath, abdominal pain or diarrhea. No fever or chills.  Past Medical History  Diagnosis Date  . Hypertension   . Anemia   . Diabetes mellitus   . Peripheral vascular disease   . Hemodialysis patient 03/15/11    "Tues; Thurs; Sat; [facility]"  . Blood transfusion   . Chronic back pain   . Rash 03/15/11    "admitted me to find out what its' from; UE, waist, thighs, groin"  . Renal failure     hd 4/12    Past Surgical History  Procedure Laterality Date  . Insertion of dialysis catheter  07/29/10    right subclavian  . Arteriovenous graft placement  10/01/10    left upper arm  . Below knee leg amputation  2004    left  . Amputation  03/04/2011    Procedure: AMPUTATION DIGIT;  Surgeon: [name], MD;  Location: [facility] OR;  Service: Orthopedics;   Laterality: Right;  RT GREAT TOE AMP  . Cataract extraction w/ intraocular lens implant  12/2009    right eye  . Carpal tunnel release  ~ 2009    left wrist  . Tee without cardioversion  03/18/2011    Procedure: TRANSESOPHAGEAL ECHOCARDIOGRAM (TEE);  Surgeon: [name], MD;  Location: [facility] ENDOSCOPY;  Service: Cardiovascular;  Laterality: N/A;    No family history on file.  History  Substance Use Topics  . Smoking status: Never Smoker   . Smokeless tobacco: Never Used  . Alcohol Use: No    OB History   Grav Para Term Preterm Abortions TAB SAB Ect Mult Living                  Review of Systems At least 10pt or greater review of systems completed and are negative except where specified in the HPI.  Allergies  Codeine; Levofloxacin; and Penicillins  Home Medications   Current Outpatient Rx  Name  Route  Sig  Dispense  Refill  . collagenase (SANTYL) ointment   Topical   Apply 1 application topically daily. To left hand incision         . insulin aspart (NOVOLOG) 100 UNIT/ML injection   Subcutaneous   Inject 0-10 Units into the skin 4 (four) times daily -  before meals and at bedtime. Sliding scale >151-200=2 units; 201-250=4 units; 251-300=6 units; 301-350=8units; 351-400=10units; >400 notify MD         .  lanthanum (FOSRENOL) 1000 MG chewable tablet   Oral   Chew 1 tablet (1,000 mg total) by mouth 3 (three) times daily with meals.   180 tablet   0   . loperamide (IMODIUM) 2 MG capsule   Oral   Take 2 mg by mouth 4 (four) times daily as needed for diarrhea or loose stools.         . metoCLOPramide (REGLAN) 5 MG tablet   Oral   Take 5 mg by mouth 4 (four) times daily.         . metoprolol tartrate (LOPRESSOR) 25 MG tablet   Oral   Take 12.5 mg by mouth 2 (two) times daily.         . Multiple Vitamin (MULTIVITAMIN WITH MINERALS) TABS   Oral   Take 1 tablet by mouth daily.         . pantoprazole (PROTONIX) 40 MG tablet   Oral   Take 40 mg by  mouth every 12 (twelve) hours.          . sertraline (ZOLOFT) 100 MG tablet   Oral   Take 100 mg by mouth daily.         Marland Kitchen HYDROcodone-acetaminophen (NORCO/VICODIN) 5-325 MG per tablet   Oral   Take 1 tablet by mouth every 4 (four) hours as needed. For pain         . promethazine (PHENERGAN) 25 MG tablet   Oral   Take 25 mg by mouth every 6 (six) hours as needed.           BP 144/73  Pulse 85  Temp(Src) 97.2 F (36.2 C) (Oral)  Resp 13  SpO2 99%  Physical Exam  PHYSICAL EXAM: VITAL SIGNS:  . Filed Vitals:   07/14/12 0300 07/14/12 0315 07/14/12 0330 07/14/12 0345  BP: 130/72 144/92 155/62 133/45  Pulse:      Temp:      TempSrc:      Resp:      SpO2:       CONSTITUTIONAL: Awake, oriented, appears non-toxic HENT: Atraumatic, normocephalic, oral mucosa pink and moist, airway patent. Nares patent without drainage. External ears normal. EYES: Conjunctiva clear, EOMI, PERRLA NECK: Trachea midline, non-tender, supple CARDIOVASCULAR: Normal heart rate, Normal rhythm, No murmurs, rubs, gallops PULMONARY/CHEST: Clear to auscultation, no rhonchi, wheezes, or rales. Symmetrical breath sounds. CHEST WALL: No lesions. Non-tender. ABDOMINAL: Non-distended, soft, non-tender - no rebound or guarding.  BS normal. NEUROLOGIC: ZO:XWRUEA fields intact. PERRLA, EOMI.  Facial sensation equal to light touch bilaterally.  Good muscle bulk in the masseter muscle and good lateral movement of the jaw.  Facial expressions equal and good strength with smile/frown and puffed cheeks.  Hearing grossly intact to finger rub test.  Uvula, tongue are midline with no deviation. Symmetrical palate elevation.  Trapezius and SCM muscles are 5/5 strength bilaterally.   Rolled Pt to left and pt had immediate symptoms of vertigo, N/V with horizontal nystagmus. Strength: 5/5 strength flexors and extensors in the upper extremities. Moving lower extremities normally. Grip strength, finger adduction/abduction  5/5. Sensation: Sensation intact distally to light touch PT able to perform finger to nose and  Gait and Station: Normal heel/toe, and tandem gait.  Negative Romberg, no pronator drift EXTREMITIES: No clubbing, cyanosis, or edema. Left BKA. No change to LE per pt. SKIN: Warm, Dry, No erythema, No rash   ED Course  Procedures (including critical care time)  Labs Reviewed  GLUCOSE, CAPILLARY - Abnormal; Notable for the following:  Glucose-Capillary 123 (*)    All other components within normal limits  CBC - Abnormal; Notable for the following:    RDW 15.8 (*)    All other components within normal limits  COMPREHENSIVE METABOLIC PANEL - Abnormal; Notable for the following:    Glucose, Bld 117 (*)    Creatinine, Ser 2.64 (*)    Albumin 3.2 (*)    Alkaline Phosphatase 134 (*)    GFR calc non Af Amer 20 (*)    GFR calc Af Amer 23 (*)    All other components within normal limits  MAGNESIUM  PHOSPHORUS   No results found.   1. Benign paroxysmal positional vertigo       MDM  [name] is a 52 y.o. female presenting with BPPV. She has extensive h/o vascular disease but based on history and physical exam, I think she has perhipheral vertigo.  Her symptoms are reproducible, fatigueable, elicit horizontal nystagmus and correct on rest, near finger fixation (eye fixing on a near target) and resolved with low dose of valium and meclizine.  This patient has risk factors for central vertigo, but given her symptoms and physical exam, I think she has BPPV.  Central causes of vertigo seem much less likely at this point. She has no new neurologic deficits.  Labs are stable for this patient.  DC with meclizine for symptoms. Follow up with PCP.  I don't think she requires ENT for further DDX resolution.  I explained the diagnosis of BPPV and have given explicit precautions to return to the ER including any new or worsening symptoms. The patient understands and accepts the medical plan  as it's been dictated and I have answered their questions. Discharge instructions concerning home care and prescriptions have been given.  The patient is STABLE and is discharged to home in good condition.         Jones Skene, MD 07/14/12 [phone]

## 2012-08-14 ENCOUNTER — Other Ambulatory Visit (HOSPITAL_COMMUNITY): Payer: Self-pay | Admitting: Nephrology

## 2012-08-14 DIAGNOSIS — N186 End stage renal disease: Secondary | ICD-10-CM

## 2012-08-16 ENCOUNTER — Ambulatory Visit (HOSPITAL_COMMUNITY): Admission: RE | Admit: 2012-08-16 | Payer: Medicare PPO | Source: Ambulatory Visit

## 2012-08-21 ENCOUNTER — Ambulatory Visit (HOSPITAL_COMMUNITY)
Admission: RE | Admit: 2012-08-21 | Discharge: 2012-08-21 | Disposition: A | Discharge status: Home / Self Care | Payer: Medicare PPO | Source: Ambulatory Visit | Attending: Nephrology | Admitting: Nephrology

## 2012-08-21 DIAGNOSIS — N186 End stage renal disease: Secondary | ICD-10-CM | POA: Insufficient documentation

## 2012-08-21 MED ORDER — IOHEXOL 300 MG/ML  SOLN
100.0000 mL | Freq: Once | INTRAMUSCULAR | Status: AC | PRN
  Administered 2012-08-21: 24 mL via INTRAVENOUS

## 2012-08-21 NOTE — Procedures (Signed)
SUCCESSFUL LUE AVFISTULOGRAM NO SIG STENOSIS NO INTERVENTION NEEDED

## 2012-08-22 ENCOUNTER — Telehealth (HOSPITAL_COMMUNITY): Payer: Self-pay | Admitting: *Deleted

## 2013-01-21 ENCOUNTER — Other Ambulatory Visit: Payer: Self-pay | Admitting: *Deleted

## 2013-01-21 DIAGNOSIS — I739 Peripheral vascular disease, unspecified: Secondary | ICD-10-CM

## 2013-01-21 DIAGNOSIS — L97909 Non-pressure chronic ulcer of unspecified part of unspecified lower leg with unspecified severity: Secondary | ICD-10-CM

## 2013-01-22 ENCOUNTER — Encounter: Payer: Self-pay | Admitting: Vascular Surgery

## 2013-01-22 ENCOUNTER — Ambulatory Visit (HOSPITAL_COMMUNITY)
Admission: RE | Admit: 2013-01-22 | Discharge: 2013-01-22 | Disposition: A | Discharge status: Home / Self Care | Payer: Medicare PPO | Source: Ambulatory Visit | Attending: Vascular Surgery | Admitting: Vascular Surgery

## 2013-01-22 ENCOUNTER — Ambulatory Visit (INDEPENDENT_AMBULATORY_CARE_PROVIDER_SITE_OTHER): Payer: Medicare PPO | Admitting: Vascular Surgery

## 2013-01-22 VITALS — BP 96/56 | HR 80 | Temp 98.2°F | Ht 63.0 in | Wt 130.0 lb

## 2013-01-22 DIAGNOSIS — L97909 Non-pressure chronic ulcer of unspecified part of unspecified lower leg with unspecified severity: Secondary | ICD-10-CM

## 2013-01-22 DIAGNOSIS — I739 Peripheral vascular disease, unspecified: Secondary | ICD-10-CM

## 2013-01-22 NOTE — Progress Notes (Signed)
The patient presents today for evaluation of nonhealing left hand amputation. I've seen the patient on a single prior visit in January of 2014. At that time she has extensive gangrenous changes in her left hand. All of her prior access work and then done in [place] in [place]. I do not have access to these records. 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 Myra [name] and [facility]. He resides in a nursing facility. She continues to have dialysis via her left upper arm AV fistula. She's had slow healing of her hand amputation see me today for this. She has had a prior amputation of her right third finger  Past Medical History  Diagnosis Date  . Hypertension   . Anemia   . Diabetes mellitus   . Peripheral vascular disease   . Hemodialysis patient 03/15/11    "Tues; Thurs; Sat; [facility]"  . Blood transfusion   . Chronic back pain   . Rash 03/15/11    "admitted me to find out what its' from; UE, waist, thighs, groin"  . Renal failure     hd 4/12    History  Substance Use Topics  . Smoking status: Never Smoker   . Smokeless tobacco: Never Used  . Alcohol Use: No    No family history on file.  Allergies  Allergen Reactions  . Codeine   . Levofloxacin   . Penicillins     Current outpatient prescriptions:guaiFENesin-codeine (ROBITUSSIN AC) 100-10 MG/5ML syrup, Take 30 mLs by mouth every 6 (six) hours as needed for cough., Disp: , Rfl: ;  HYDROcodone-acetaminophen (NORCO/VICODIN) 5-325 MG per tablet, Take 1 tablet by mouth every 4 (four) hours as needed. For pain, Disp: , Rfl:  insulin aspart (NOVOLOG) 100 UNIT/ML injection, Inject 0-10 Units into the skin 4 (four) times daily -  before meals and at bedtime. Sliding scale >151-200=2 units; 201-250=4 units; 251-300=6 units; 301-350=8units; 351-400=10units; >400 notify MD, Disp: , Rfl: ;  lanthanum (FOSRENOL) 1000 MG chewable tablet, Chew 1  tablet (1,000 mg total) by mouth 3 (three) times daily with meals., Disp: 180 tablet, Rfl: 0 loperamide (IMODIUM) 2 MG capsule, Take 2 mg by mouth 4 (four) times daily as needed for diarrhea or loose stools., Disp: , Rfl: ;  loratadine (CLARITIN) 10 MG tablet, Take 10 mg by mouth daily., Disp: , Rfl: ;  meclizine (ANTIVERT) 50 MG tablet, Take 0.5 tablets (25 mg total) by mouth 3 (three) times daily as needed for dizziness or nausea., Disp: 30 tablet, Rfl: 0 metoprolol tartrate (LOPRESSOR) 25 MG tablet, Take 12.5 mg by mouth 2 (two) times daily., Disp: , Rfl: ;  Multiple Vitamin (MULTIVITAMIN WITH MINERALS) TABS, Take 1 tablet by mouth daily., Disp: , Rfl: ;  pantoprazole (PROTONIX) 40 MG tablet, Take 40 mg by mouth every 12 (twelve) hours. , Disp: , Rfl: ;  sertraline (ZOLOFT) 100 MG tablet, Take 75 mg by mouth daily. , Disp: , Rfl:  collagenase (SANTYL) ointment, Apply 1 application topically daily. To left hand incision, Disp: , Rfl: ;  metoCLOPramide (REGLAN) 5 MG tablet, Take 5 mg by mouth 4 (four) times daily., Disp: , Rfl: ;  promethazine (PHENERGAN) 25 MG tablet, Take 25 mg by mouth every 6 (six) hours as needed., Disp: , Rfl:   BP 96/56  Pulse 80  Temp(Src) 98.2 F (36.8 C) (Oral)  Ht 5\' 3"  (1.6 m)  Wt 130 lb (58.968 kg)  BMI 23.03 kg/m2  SpO2 100%  Body mass index is 23.03 kg/(m^2).       On physical exam her left wrist disarticulation has some erythema and very small punctate area with does not have any evidence of subcutaneous abscess. She does not have pain. She does not have radial or ulnar pulses bilaterally. On listening with a Doppler she does have a very dampened monophasic radial signal in her left wrist and it is augmented with compression of her AV fistula.  Impression and plan: Very unfortunate woman with a prior amputation of her left hand and also third finger on her right hand. She would have somewhat improved flow into her left wrist stump with ligation of her fistula.  I am quite concerned however the tissue it did not have any access. I would be quite hesitant to proceed with access in her right arm due to ischemia currently in worsening ischemia with steel. She is right-handed. She understands this. I would suggest referral back to her hand surgeon if she has continued healing difficulty. She may require a higher level of amputation.

## 2013-02-07 ENCOUNTER — Ambulatory Visit: Payer: Self-pay | Admitting: Physician Assistant

## 2013-11-15 ENCOUNTER — Emergency Department (HOSPITAL_COMMUNITY): Payer: Medicare PPO

## 2013-11-15 ENCOUNTER — Emergency Department (HOSPITAL_COMMUNITY)
Admission: EM | Admit: 2013-11-15 | Discharge: 2013-11-15 | Disposition: A | Discharge status: Home / Self Care | Payer: Medicare PPO | Attending: Emergency Medicine | Admitting: Emergency Medicine

## 2013-11-15 ENCOUNTER — Encounter (HOSPITAL_COMMUNITY): Payer: Self-pay | Admitting: Emergency Medicine

## 2013-11-15 DIAGNOSIS — Z794 Long term (current) use of insulin: Secondary | ICD-10-CM | POA: Insufficient documentation

## 2013-11-15 DIAGNOSIS — Z862 Personal history of diseases of the blood and blood-forming organs and certain disorders involving the immune mechanism: Secondary | ICD-10-CM | POA: Insufficient documentation

## 2013-11-15 DIAGNOSIS — N186 End stage renal disease: Secondary | ICD-10-CM | POA: Insufficient documentation

## 2013-11-15 DIAGNOSIS — I120 Hypertensive chronic kidney disease with stage 5 chronic kidney disease or end stage renal disease: Secondary | ICD-10-CM | POA: Diagnosis not present

## 2013-11-15 DIAGNOSIS — Z79899 Other long term (current) drug therapy: Secondary | ICD-10-CM | POA: Insufficient documentation

## 2013-11-15 DIAGNOSIS — E119 Type 2 diabetes mellitus without complications: Secondary | ICD-10-CM | POA: Insufficient documentation

## 2013-11-15 DIAGNOSIS — M25511 Pain in right shoulder: Secondary | ICD-10-CM

## 2013-11-15 DIAGNOSIS — M25519 Pain in unspecified shoulder: Secondary | ICD-10-CM | POA: Diagnosis not present

## 2013-11-15 DIAGNOSIS — G8929 Other chronic pain: Secondary | ICD-10-CM | POA: Diagnosis not present

## 2013-11-15 DIAGNOSIS — Z992 Dependence on renal dialysis: Secondary | ICD-10-CM | POA: Diagnosis not present

## 2013-11-15 DIAGNOSIS — Z880 Allergy status to penicillin: Secondary | ICD-10-CM | POA: Insufficient documentation

## 2013-11-15 LAB — CBC
HCT: 37.2 % (ref 36.0–46.0)
HEMOGLOBIN: 12.4 g/dL (ref 12.0–15.0)
MCH: 32.6 pg (ref 26.0–34.0)
MCHC: 33.3 g/dL (ref 30.0–36.0)
MCV: 97.9 fL (ref 78.0–100.0)
Platelets: 128 10*3/uL — ABNORMAL LOW (ref 150–400)
RBC: 3.8 MIL/uL — ABNORMAL LOW (ref 3.87–5.11)
RDW: 14.8 % (ref 11.5–15.5)
WBC: 4.8 10*3/uL (ref 4.0–10.5)

## 2013-11-15 LAB — COMPREHENSIVE METABOLIC PANEL
ALBUMIN: 4.1 g/dL (ref 3.5–5.2)
ALK PHOS: 121 U/L — AB (ref 39–117)
ALT: 12 U/L (ref 0–35)
AST: 24 U/L (ref 0–37)
Anion gap: 19 — ABNORMAL HIGH (ref 5–15)
BUN: 18 mg/dL (ref 6–23)
CO2: 29 mEq/L (ref 19–32)
Calcium: 9.9 mg/dL (ref 8.4–10.5)
Chloride: 95 mEq/L — ABNORMAL LOW (ref 96–112)
Creatinine, Ser: 3.73 mg/dL — ABNORMAL HIGH (ref 0.50–1.10)
GFR calc Af Amer: 15 mL/min — ABNORMAL LOW (ref >90)
GFR calc non Af Amer: 13 mL/min — ABNORMAL LOW (ref >90)
GLUCOSE: 141 mg/dL — AB (ref 70–99)
POTASSIUM: 4.7 meq/L (ref 3.7–5.3)
SODIUM: 143 meq/L (ref 137–147)
TOTAL PROTEIN: 8.5 g/dL — AB (ref 6.0–8.3)
Total Bilirubin: 0.4 mg/dL (ref 0.3–1.2)

## 2013-11-15 LAB — TROPONIN I: Troponin I: <0.3 ng/mL (ref <0.30)

## 2013-11-15 MED ORDER — TRAMADOL HCL 50 MG PO TABS
50.0000 mg | ORAL_TABLET | Freq: Once | ORAL | Status: AC
  Administered 2013-11-15: 50 mg via ORAL
  Filled 2013-11-15: qty 1

## 2013-11-15 MED ORDER — HYDROCODONE-ACETAMINOPHEN 5-325 MG PO TABS: 1.0000 | ORAL_TABLET | Freq: Four times a day (QID) | ORAL | Status: DC | PRN

## 2013-11-15 MED ORDER — HYDROCODONE-ACETAMINOPHEN 5-325 MG PO TABS
2.0000 | ORAL_TABLET | Freq: Once | ORAL | Status: AC
  Administered 2013-11-15: 2 via ORAL
  Filled 2013-11-15: qty 2

## 2013-11-15 NOTE — ED Provider Notes (Signed)
CSN: [id number]     Arrival date & time 11/15/13  1512 History   First MD Initiated Contact with Patient 11/15/13 1514     Chief Complaint  Patient presents with  . Shoulder Pain     (Consider location/radiation/quality/duration/timing/severity/associated sxs/prior Treatment) The history is provided by the patient.  pt c/o right shoulder pain for the past couple weeks. Constant. Dull. Non radiating. No relation to activity or exertion, states esp bothers at night when trying to rest/sleep.  Denies trauma, fall, injury, or strain. No radicular pain, or arm numbness or weakness. Pain is located right shoulder posteriorly. No anterior pain. No chest pain or discomfort of any sort. Pain is not pleuritic. No associated sob, nv or diaphoresis.  No acute or abrupt change in pain today.  Patient states she told dialyses staff that she was having shoulder pain so they abruptly d/c'd dialyses and sent to ED via EMS.  Pt denies cough or sob. No fever or chills. No abd pain or nv.     Past Medical History  Diagnosis Date  . Hypertension   . Anemia   . Diabetes mellitus   . Peripheral vascular disease   . Hemodialysis patient 03/15/11    "Tues; Thurs; Sat; [facility]"  . Blood transfusion   . Chronic back pain   . Rash 03/15/11    "admitted me to find out what its' from; UE, waist, thighs, groin"  . Renal failure     hd 4/12   Past Surgical History  Procedure Laterality Date  . Insertion of dialysis catheter  07/29/10    right subclavian  . Arteriovenous graft placement  10/01/10    left upper arm  . Below knee leg amputation  2004    left  . Amputation  03/04/2011    Procedure: AMPUTATION DIGIT;  Surgeon: Nadara MustardMarcus V Duda, MD;  [facility];  Service: Orthopedics;  Laterality: Right;  RT GREAT TOE AMP  . Cataract extraction w/ intraocular lens implant  12/2009    right eye  . Carpal tunnel release  ~ 2009    left wrist  . Tee without cardioversion  03/18/2011   Procedure: TRANSESOPHAGEAL ECHOCARDIOGRAM (TEE);  Surgeon: Marca Anconaalton McLean, MD;  [facility];  Service: Cardiovascular;  Laterality: N/A;   No family history on file. History  Substance Use Topics  . Smoking status: Never Smoker   . Smokeless tobacco: Never Used  . Alcohol Use: No   OB History   Grav Para Term Preterm Abortions TAB SAB Ect Mult Living                 Review of Systems  Constitutional: Negative for fever and chills.  HENT: Negative for sore throat.   Eyes: Negative for redness.  Respiratory: Negative for shortness of breath.   Cardiovascular: Negative for chest pain and leg swelling.  Gastrointestinal: Negative for nausea, vomiting, abdominal pain, diarrhea and constipation.  Genitourinary: Negative for flank pain.  Musculoskeletal: Negative for back pain and neck pain.  Skin: Negative for rash.  Neurological: Negative for headaches.  Hematological: Does not bruise/bleed easily.  Psychiatric/Behavioral: Negative for confusion.      Allergies  Codeine; Levofloxacin; and Penicillins  Home Medications   Prior to Admission medications   Medication Sig Start Date End Date Taking? Authorizing Provider  collagenase (SANTYL) ointment Apply 1 application topically daily. To left hand incision    Historical Provider, MD  guaiFENesin-codeine (ROBITUSSIN AC) 100-10 MG/5ML syrup Take 30 mLs  by mouth every 6 (six) hours as needed for cough.    Historical Provider, MD  HYDROcodone-acetaminophen (NORCO/VICODIN) 5-325 MG per tablet Take 1 tablet by mouth every 4 (four) hours as needed. For pain    Historical Provider, MD  insulin aspart (NOVOLOG) 100 UNIT/ML injection Inject 0-10 Units into the skin 4 (four) times daily -  before meals and at bedtime. Sliding scale >151-200=2 units; 201-250=4 units; 251-300=6 units; 301-350=8units; 351-400=10units; >400 notify MD    Historical Provider, MD  lanthanum (FOSRENOL) 1000 MG chewable tablet Chew 1 tablet (1,000 mg total)  by mouth 3 (three) times daily with meals. 03/21/12   Elenora Gamma, MD  loperamide (IMODIUM) 2 MG capsule Take 2 mg by mouth 4 (four) times daily as needed for diarrhea or loose stools.    Historical Provider, MD  loratadine (CLARITIN) 10 MG tablet Take 10 mg by mouth daily.    Historical Provider, MD  meclizine (ANTIVERT) 50 MG tablet Take 0.5 tablets (25 mg total) by mouth 3 (three) times daily as needed for dizziness or nausea. 07/14/12   John-Adam Bonk, MD  metoCLOPramide (REGLAN) 5 MG tablet Take 5 mg by mouth 4 (four) times daily.    Historical Provider, MD  metoprolol tartrate (LOPRESSOR) 25 MG tablet Take 12.5 mg by mouth 2 (two) times daily.    Historical Provider, MD  Multiple Vitamin (MULTIVITAMIN WITH MINERALS) TABS Take 1 tablet by mouth daily.    Historical Provider, MD  pantoprazole (PROTONIX) 40 MG tablet Take 40 mg by mouth every 12 (twelve) hours.     Historical Provider, MD  promethazine (PHENERGAN) 25 MG tablet Take 25 mg by mouth every 6 (six) hours as needed.    Historical Provider, MD  sertraline (ZOLOFT) 100 MG tablet Take 75 mg by mouth daily.     Historical Provider, MD   There were no vitals taken for this visit. Physical Exam  Nursing note and vitals reviewed. Constitutional: She is oriented to person, place, and time. She appears well-developed and well-nourished. No distress.  HENT:  Mouth/Throat: Oropharynx is clear and moist.  Eyes: Conjunctivae are normal. Pupils are equal, round, and reactive to light. No scleral icterus.  Neck: Normal range of motion. Neck supple. No tracheal deviation present.  Cardiovascular: Normal rate, regular rhythm, normal heart sounds and intact distal pulses.   Pulmonary/Chest: Effort normal and breath sounds normal. No respiratory distress.  Abdominal: Soft. Normal appearance and bowel sounds are normal. She exhibits no distension and no mass. There is no tenderness. There is no rebound and no guarding.  Genitourinary:  No cva  tenderness  Musculoskeletal: She exhibits no edema and no tenderness.  Good rom right shoulder without pain. No sts, no skin changes or erythema. Muscular tenderness right trapezius and scapula area. No RUE swelling. Radial pulse 2+. CT spine non tender.   Neurological: She is alert and oriented to person, place, and time.  Motor fxn intact RUE, str 5/5, sens intact.  Skin: Skin is warm and dry. No rash noted.  Psychiatric: She has a normal mood and affect.    ED Course  Procedures (including critical care time)   EKG Interpretation   Date/Time:  Friday November 15 2013 15:29:29 EDT Ventricular Rate:  77 PR Interval:  168 QRS Duration: 82 QT Interval:  473 QTC Calculation: 535 R Axis:   58 Text Interpretation:  Sinus rhythm Prolonged QT interval Nonspecific ST  abnormality No significant change since last tracing Confirmed by Jamaria Amborn   MD,  Maycee Blasco ([id number]) on 11/15/2013 3:36:23 PM      MDM  Xrays.  Reviewed nursing notes and prior charts for additional history.   vicodin po.  Pt symptoms appear most likely musculoskeletal in etiology.  Recheck pt, no pain. No fevers.   No current or recent cp or discomfort of any sort.   Pt denies feeling weak, fatigued or sob.  Pt appears stable for d/c.     Suzi Roots, MD 11/15/13 [phone]

## 2013-11-15 NOTE — Progress Notes (Signed)
Left arm diaysis needles x 2 removed.  Pressure held until no bleeding about 15 minutes.  Pressure dressing applied

## 2013-11-15 NOTE — ED Notes (Signed)
Rt. Arm pain into her rt. Shoulder pain began about 1 month ago.   Denies any injury.  Pt. Was sent to us from dialysis. Pt. Is alert and oriented X 3 Denies any chest pain or sob.  Pt. Arrived with her dialysis catheter accessed.  Skin is p/w/d

## 2013-11-15 NOTE — Discharge Instructions (Signed)
You may take hydrocodone as need for pain. No driving for the next 6 hours or when taking hydrocodone. Also, do not take tylenol or acetaminophen containing medication when taking hydrocodone. Follow up with your doctor in the coming week - discuss possible referral to orthopedist if shoulder pain persists or worsens. As you did not complete your dialyses today, contact your kidney doctor today or tomorrow morning and discuss plan for your next dialyses, and/or whether they can dialyze you tomorrow if needed.  Your potassium today is normal. Return to ER if worse, new symptoms, fevers, chest pain, trouble breathing, other concern.     Shoulder Pain The shoulder is the joint that connects your arms to your body. The bones that form the shoulder joint include the upper arm bone (humerus), the shoulder blade (scapula), and the collarbone (clavicle). The top of the humerus is shaped like a ball and fits into a rather flat socket on the scapula (glenoid cavity). A combination of muscles and strong, fibrous tissues that connect muscles to bones (tendons) support your shoulder joint and hold the ball in the socket. Small, fluid-filled sacs (bursae) are located in different areas of the joint. They act as cushions between the bones and the overlying soft tissues and help reduce friction between the gliding tendons and the bone as you move your arm. Your shoulder joint allows a wide range of motion in your arm. This range of motion allows you to do things like scratch your back or throw a ball. However, this range of motion also makes your shoulder more prone to pain from overuse and injury. Causes of shoulder pain can originate from both injury and overuse and usually can be grouped in the following four categories:  Redness, swelling, and pain (inflammation) of the tendon (tendinitis) or the bursae (bursitis).  Instability, such as a dislocation of the joint.  Inflammation of the joint  (arthritis).  Broken bone (fracture). HOME CARE INSTRUCTIONS   Apply ice to the sore area.  Put ice in a plastic bag.  Place a towel between your skin and the bag.  Leave the ice on for 15-20 minutes, 3-4 times per day for the first 2 days, or as directed by your health care provider.  Stop using cold packs if they do not help with the pain.  If you have a shoulder sling or immobilizer, wear it as long as your caregiver instructs. Only remove it to shower or bathe. Move your arm as little as possible, but keep your hand moving to prevent swelling.  Squeeze a soft ball or foam pad as much as possible to help prevent swelling.  Only take over-the-counter or prescription medicines for pain, discomfort, or fever as directed by your caregiver. SEEK MEDICAL CARE IF:   Your shoulder pain increases, or new pain develops in your arm, hand, or fingers.  Your hand or fingers become cold and numb.  Your pain is not relieved with medicines. SEEK IMMEDIATE MEDICAL CARE IF:   Your arm, hand, or fingers are numb or tingling.  Your arm, hand, or fingers are significantly swollen or turn white or blue. MAKE SURE YOU:   Understand these instructions.  Will watch your condition.  Will get help right away if you are not doing well or get worse. Document Released: 01/19/2005 Document Revised: 08/26/2013 Document Reviewed: 03/26/2011 [facility] Patient Information 2015 [employer] [place] [place]. This information is not intended to replace advice given to you by your health care provider. Make sure you discuss any  questions you have with your health care provider.

## 2014-07-17 IMAGING — CR LEFT MIDDLE FINGER 2+V
1 series · 4 of 4 positions shown · non-contrast
Comparison: none

REASON FOR EXAM: redness swelling with injury
COMMENTS:

PROCEDURE:     DXR - DXR FINGER MID 3RD DIGIT LT HAND  - December 15, 2011  [DATE]
RESULT:     Comparison: None.

[Series 1: pa · 0.17mm/px · 4 of 4 slices shown]
[im 1/4]
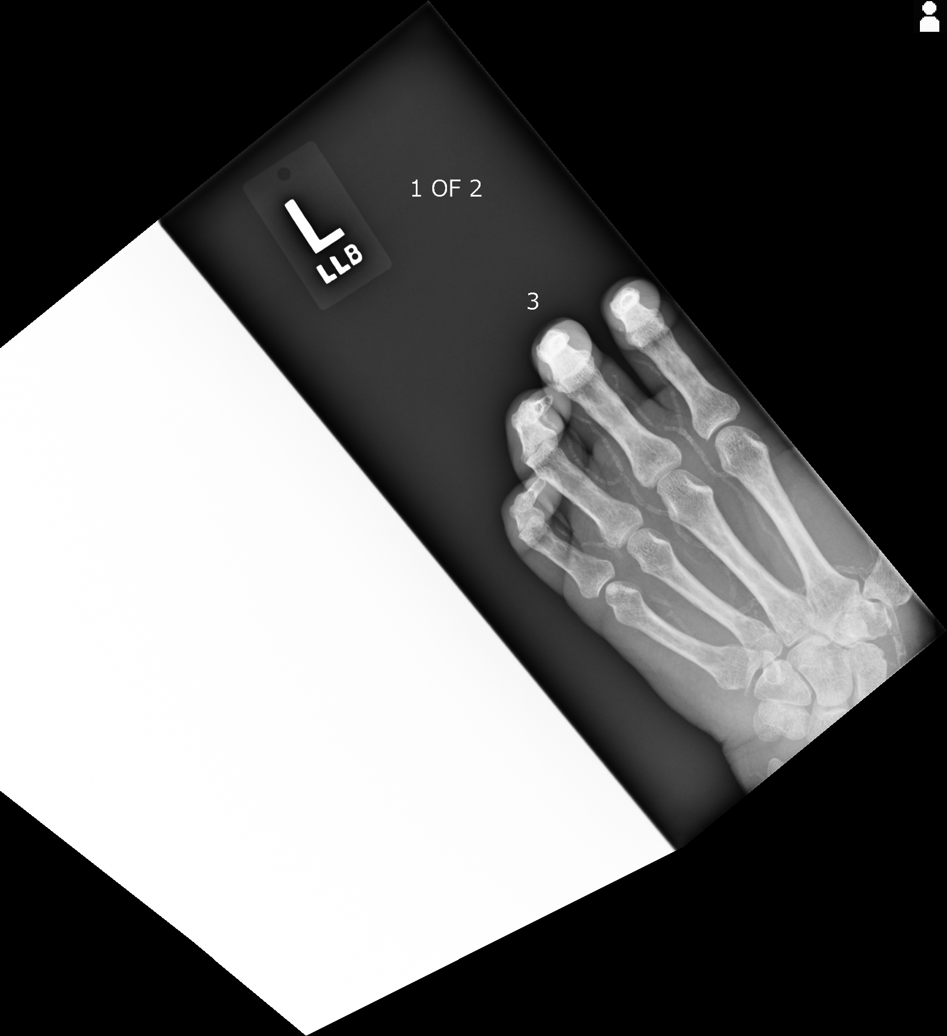
[im 2/4]
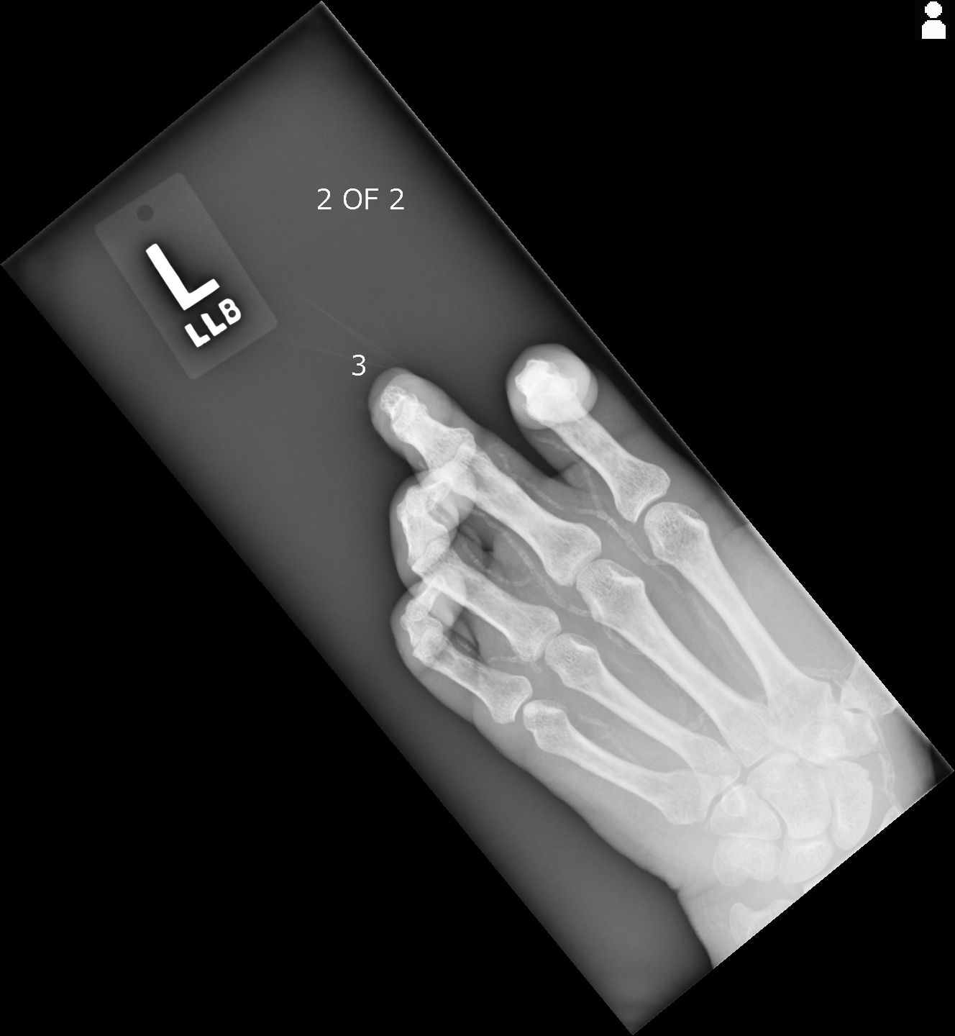
[im 3/4]
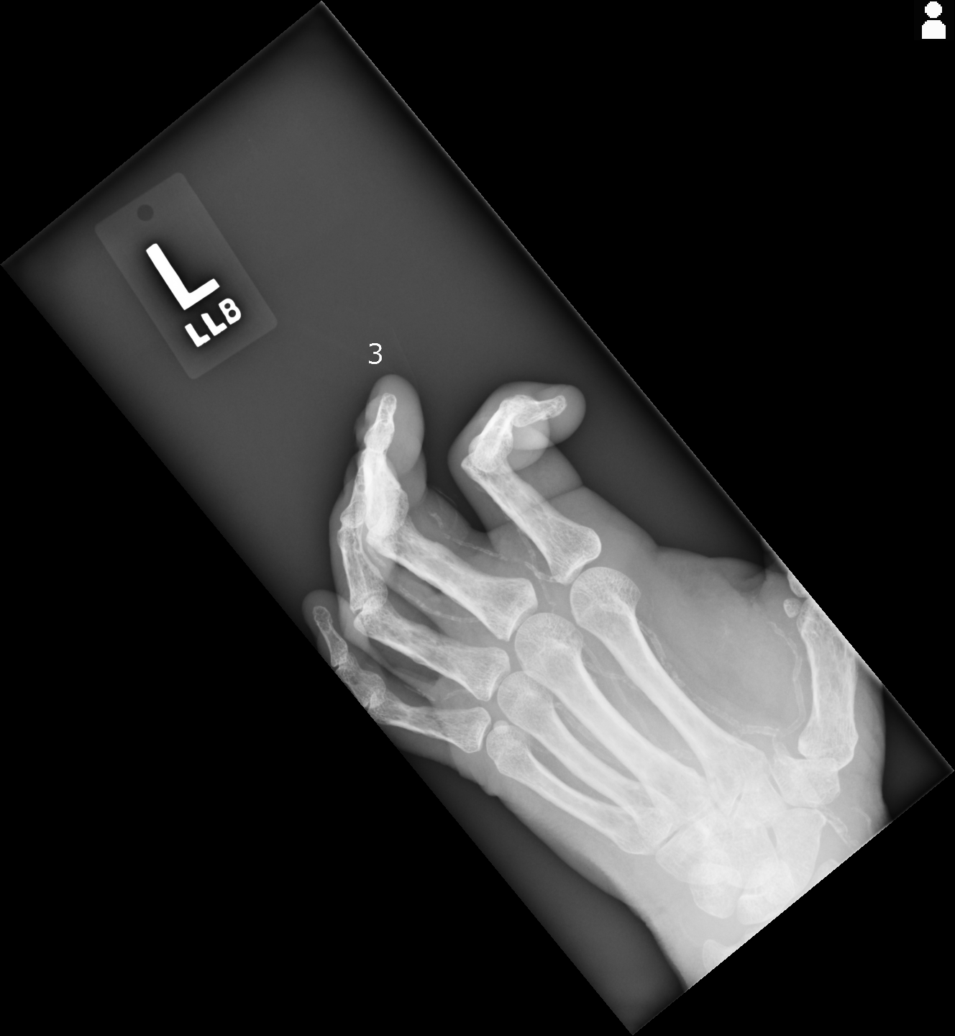
[im 4/4]
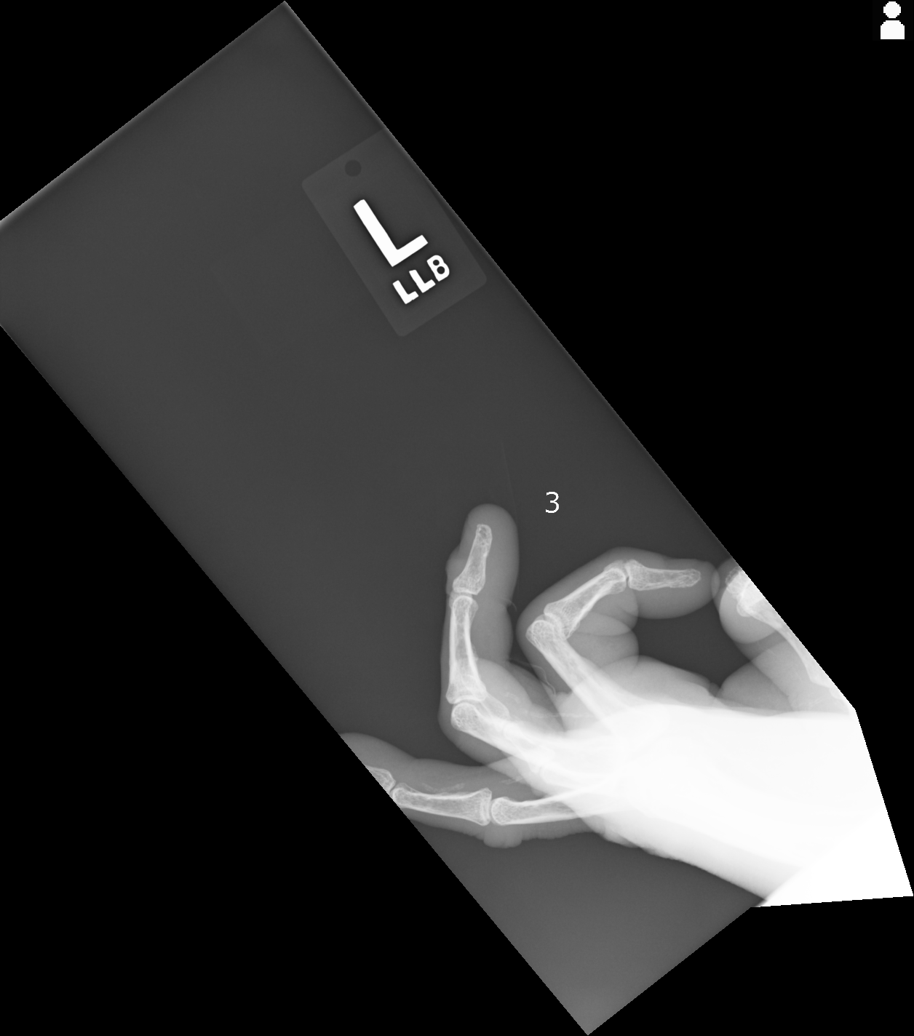

[4 of 4 positions shown; findings below may reference images not displayed]

FINDINGS: Evaluation is limited by persistent flexion of the fingers. By report, these
were the best images possible. There are extensive vascular calcifications.
Small calcific density adjacent to the ulnar side of the middle phalanx of
the third digit is likely secondary to vascular calcification. No definite
fracture seen. Small lucency in the base of the distal phalanx of the third
digit is felt to be secondary to a prominent nutrient vessel.
IMPRESSION: No definite fracture seen. Evaluation is limited by the persistent flexion
of the fingers.

[REDACTED]

## 2014-08-12 NOTE — Consult Note (Signed)
PATIENT NAME:  [name], [name]:  [id number] DATE OF BIRTH:  05-Mar-1961  DATE OF CONSULTATION:  12/16/2011  REFERRING PHYSICIAN:   CONSULTING PHYSICIAN:  Kathreen DevoidKevin L. Susannah Carbin, MD  REASON FOR CONSULTATION: Left middle finger necrosis with possible cellulitis.   HISTORY: Ms. [name] is a 54 year old female who has end-stage renal disease, hyperlipidemia, hypertension and diabetes with neuropathy who presented to the Emergency Room on 12/15/2011. The patient complained of pain in her left middle finger and had caught the right hand in a car door by report. In the ER the patient was found to have a dusky cold left middle finger. She was seen by Dr. Wyn Quakerew from vascular who felt that there was no acute vascular compromise or need for acute vascular intervention. I was consulted to see the patient from the orthopedic standpoint. My partner, Dr. Myra Rudehristopher Smith, had performed an amputation on the right middle finger for similar vascular compromise and infection.   At the bedside, the patient states that her left middle finger pain has improved. She is currently comfortable without complaint.   PHYSICAL EXAMINATION: The digits on the left hand have chronic flexion contracture. The left middle finger has dry necrosis over the middle phalanx. The proximal phalanx is not necrotic and there is a slight rim of erythema over the proximal border of the necrotic region. This is likely reactive erythema. There is no drainage from the finger. The distal phalanx also does not have necrosis. There is slightly dusky appearance to this, but the patient has sensation at the tip. There are no other injuries or signs of infection involving the other digits or palm. She has a palpable radial pulse.   RADIOLOGY: Films of the right middle finger taken in the Emergency Room were reviewed. This shows no evidence of fracture. There are calcified vessels on the AP view throughout the hand.   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   PLAN: The patient has been receiving IV antibiotics. She was admitted to medical service. I have discussed the case with Dr. Clemens Catholicagsdale from the Emergency Room, Dr. Wyn Quakerew from vascular, as well as my partner, Dr. Myra Rudehristopher Smith. Dr. Katrinka BlazingSmith was due to see the patient in the office today. I informed the patient that he would be happy to see her again in the office for further evaluation of her left finger. The patient will contact our office for follow up when she is discharged from the hospital. I will defer to the medical service for antibiotic recommendations. The patient does have dry necrosis and will likely require amputation in the future, but this does not to be performed acutely. Dr. Katrinka BlazingSmith will arrange with Dr. Wyn Quakerew if he requires angiography prior to any procedure.    ____________________________ Kathreen DevoidKevin L. Ireanna Finlayson, MD klk:ap D: 12/20/2011 17:06:29 ET T: 12/20/2011 17:32:47 ET JOB#: [id number]  cc: Kathreen DevoidKevin L. Joy Reiger, MD, <Dictator> Kathreen DevoidKEVIN L Perkins Molina MD ELECTRONICALLY SIGNED 12/20/2011 23:14

## 2014-08-12 NOTE — Op Note (Signed)
PATIENT NAME:  [name], [name] MR#:  [id number] DATE OF BIRTH:  [birth year]  DATE OF PROCEDURE:  01/06/2012  PREOPERATIVE DIAGNOSIS: Gangrenous left long finger.   POSTOPERATIVE DIAGNOSIS: Gangrenous left long finger.   PROCEDURE: Amputation of left long finger through base of proximal phalanx.   SURGEON: Myra Rudehristopher Gionna Polak, M.D.   ANESTHESIA: General.   COMPLICATIONS: None.   TOURNIQUET TIME: None.   DESCRIPTION OF PROCEDURE: After adequate induction of general anesthesia, the left hand and arm are thoroughly prepped with ChloraPrep and draped in standard sterile fashion. The Esmarch bandage was used briefly about the mid forearm as [name] tourniquet, but the AV fistula in the upper arm was left unharmed. Circumferential skin and tissue incision is made down to the bone of the proximal phalanx. There is good bleeding skin in this area. The digital nerves are pulled, cut, and allowed to retract proximally. The bone is cut off approximately 1 cm shorter than the skin using the rongeur. Specimen is sent to pathology. The wound is thoroughly irrigated multiple times. Skin edges are closed with 4-0 nylon sutures. [name] large vessel drain is brought out through the central portion of the wound. Hypertrophic skin in the palm is debrided. [name] soft bulky dressing is applied. The patient is returned to the recovery room in satisfactory condition having tolerated the procedure quite well. ____________________________ Clare Gandyhristopher E. Jigar Zielke, MD ces:slb D: 01/06/2012 08:42:46 ET     T: 01/06/2012 11:19:03 ET        JOB#: [id number] cc: Clare Gandyhristopher E. [name], MD, <Dictator> Clare GandyHRISTOPHER E Vee Bahe MD ELECTRONICALLY SIGNED 01/06/2012 13:28

## 2014-08-12 NOTE — Consult Note (Signed)
Patient in ER with a gangrenous left third finger.  Has her AVF on that side, but had no ischemic symptoms prior to injuring this finger last week and having it progress to gangrene.  The patient has a history less than a year ago of having the same issue on the right third finger.  This side, did not have an access.  It was treated with amputation and I&D for infection by Orthopedics.  She will need her left third finger removed at some point.  Abx are reasonable to protect viable nearby tissue.  If Orthopedics desires an angiogram prior to their amputation, will be happy to do.  If she is admitted, this can be coordinated in house.  If not, we can schedule for next week as an outpatient if desired by Orthopedics.  Electronic Signatures: Annice Needyew, Leam Madero S (MD)  (Signed on 22-Aug-13 10:48)  Authored  Last Updated: 22-Aug-13 10:48 by Annice Needyew, Chanay Nugent S (MD)
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
## 2014-08-12 NOTE — Discharge Summary (Signed)
PATIENT NAME:  [name], [name] MR#:  [id number] DATE OF BIRTH:  [birth year]  DATE OF ADMISSION:  12/15/2011 DATE OF DISCHARGE:  12/17/2011   DIAGNOSES: 1. Left hand third finger gangrene. 2. End-stage renal disease. 3. Hypertension. 4. Diabetes. 5. Hyperlipidemia. 6. Peripheral vascular disease.   DISPOSITION: The patient is being discharged home.   FOLLOW-UP:  1. She has been instructed to follow-up with Dr. Myra Rudehristopher Smith on Wednesday, August 28th, in his office.  2. Follow-up with Dr. Mady HaagensenMunsoor Lateef in 1 to 2 weeks after discharge. 3. Follow-up with Dr. Loma Senderharles Phillips in 1 to 2 weeks after discharge.   DIET: Low sodium, low phosphorous, 1500 mL fluid restriction.   ACTIVITY: As tolerated.   DISCHARGE MEDICATIONS:  1. Levaquin 250 mg daily. 2. Sevelamer 800 mg 2 tablets t.i.d. with meals.  3. Tylenol/hydrocodone 300/5 one tablet q.6 hours p.r.n.  4. Lasix 80 mg daily on nondialysis days.  5. Indomethacin 25 mg t.i.d.  6. Renal caps 1 capsule once [name] day.  7. Iron 100 Plus one capsule once [name] day. 8. Prevacid 15 mg once [name] day. 9. Ambien 5 mg daily.    CONSULTATIONS:  1. Renal consultation with Dr. Cherylann RatelLateef and Dr. Wynelle LinkKolluru  2. Orthopedic consultation with Dr. Martha ClanKrasinski  3. Vascular Surgery consultation with Dr. Wyn Quakerew   LABORATORY, DIAGNOSTIC, AND RADIOLOGICAL DATA: Blood cultures no growth in 48 hours. Urine culture more than 100,000 unidentified organism.   Left hand third finger x-ray no definite fracture.  Hepatitis B surface antigen negative. White count 11.3 to 7.7, hemoglobin 11.5, normal platelet count. Glucose 116 to 110, creatinine 7.25, sodium 132, potassium 4.4, VLDL 21, LDL 57, phosphorous 8.7. Hemoglobin A1c 5.9. Elevated ALP.   HOSPITAL COURSE: The patient is [name] 54 year old female with past medical history of end-stage renal disease, hypertension, and diabetes not on any medications who presented with gangrene of her left third finger with surrounding  cellulitis. She had prior presentation on her right hand third finger which was amputated by Dr. Myra Rudehristopher Smith. The patient was admitted to the hospital and started on IV antibiotics. Vascular Surgery consultation with Dr. Wyn Quakerew was obtained who recommended no urgent intervention. Dr. Wyn Quakerew was willing to do an angiogram if the orthopedic physician wanted to. The patient was evaluated by Dr. Martha ClanKrasinski who recommended conservative management. Case was discussed with the patient's orthopedic physician, Dr. Myra Rudehristopher Smith. The patient was not systemically ill from her gangrene. Her blood cultures were negative. Her white count was normal. She was afebrile. Therefore, Dr. Myra Rudehristopher Smith recommended discharging the patient home on oral antibiotics and follow-up with him on 12/21/2011. He will set up amputation as outpatient. The patient was dialyzed as per her scheduled dialysis. The patient followed her outpatient dialysis schedule and was dialyzed on Saturday, 12/17/2011. She will resume her outpatient dialysis. The patient has [name] history of hypertension, however, she is not on any medications and her blood pressure is stable off any medications. She also has [name] history of diabetes but her hemoglobin A1c is 5.7. Her LDL is 57. The patient had an abnormal urinalysis. Initially her urine culture was negative. Subsequently it grew unidentified bacteria. Her oral antibiotic of Levaquin should cover for her urinary tract infection, although the patient did not have any symptoms of dysuria. She is being discharged home in [name] stable condition.       TIME SPENT: 45 minutes.   ____________________________ Darrick MeigsSangeeta Karrina Lye, MD sp:drc D: 12/17/2011 14:34:18 ET T: 12/17/2011 14:46:46 ET JOB#: [id number]  cc: Darrick Meigs, MD, <Dictator> Clare Gandy, MD Munsoor Lizabeth Leyden, MD Marcine Matar., MD Darrick Meigs MD ELECTRONICALLY SIGNED 12/18/2011 8:46

## 2014-08-15 NOTE — Op Note (Signed)
PATIENT NAME:  [name], [name]#:  [id number] DATE OF BIRTH:  02-16-1961  DATE OF PROCEDURE:  05/25/2012  PREOPERATIVE DIAGNOSES:  1. Severe chronic renal failure.  2. Gangrene left hand, fingers and palm.   PREOPERATIVE DIAGNOSES:  1. Severe chronic renal failure. 2. Gangrene left hand, fingers and palm.  PROCEDURE: Amputation of left hand through radiocarpal joint.   SURGEON: Myra Rudehristopher Stone Spirito, MD   ANESTHESIA: General.   COMPLICATIONS: None.   TOURNIQUET TIME: Approximately 1 hour.   PROCEDURE: Intravenously, 300 mg of clindamycin were given. General anesthesia is induced. The left arm below the elbow is thoroughly prepped with alcohol and ChloraPrep and draped in standard sterile fashion. An Esmarch bandage is used to wrap out the forearm, and then a sterile tourniquet is used about the proximal forearm during the case. A curved incision is made  preserving as much viable skin as possible, and this is at the level of the mid metacarpal level on the dorsum and the mid palm level on the volar aspect. All areas of gangrene were omitted from the incision.  The dissection was carefully carried down palmarly, and the radial and ulnar arteries are dissected out under loupe magnification, tied off with 2-0 Ethibond suture and then cut. Hemostasis is maintained throughout the case using the Bovie. The dissection is carried down and the flaps developed both volarly and palmarly.  At the bone level, the dissection is carried down to the radiocarpal joint, and the ligaments are incised in this area, and then the hand is passed off the table. The wound is thoroughly irrigated multiple times. Fascia and ligamentous tissue is repaired with several 2-0 Vicryl sutures. A single large vessel loop drain is brought out through each edge of the wound, and then the skin is closed with the skin stapler. A soft bulky dressing is applied. The tourniquet is released.  The patient is returned to the recovery room  in satisfactory condition having tolerated the procedure quite well.   ____________________________ Clare Gandyhristopher E. Lacharles Altschuler, MD ces:cb D: 05/26/2012 12:19:35 ET T: 05/27/2012 15:25:16 ET JOB#: [id number]  cc: Clare Gandyhristopher E. Felicia Bloomquist, MD, <Dictator> Clare GandyHRISTOPHER E Kaimana Lurz MD ELECTRONICALLY SIGNED 05/29/2012 10:26

## 2014-08-15 NOTE — Discharge Summary (Signed)
PATIENT NAME:  [name], [name] MR#:  [id number] DATE OF BIRTH:  Aug 12, 1960  DATE OF ADMISSION:  05/25/2012 DATE OF DISCHARGE:    DISCHARGE DIAGNOSES: 1.  Gangrene, left hand from vascular deficiency.  2.  End stage renal failure.  3.  Anemia.  4.  Diabetes type 2.   OPERATIONS/PROCEDURES PERFORMED: Amputation of gangrenous hand by disarticulation at the wrist joint on 05/25/2012.   HISTORY AND PHYSICAL EXAMINATION: As written on admission.   LABORATORY DATA: As noted in the chart.   HOSPITAL COURSE: On 05/25/2012 the patient was admitted through same day surgery and taken to the operating room where amputation of her gangrenous hand was performed without difficulty. Careful attention was made throughout the procedure not to touch or affect her fistula in the left upper extremity. The tourniquet was used only on the forearm. The patient tolerated the procedure well and was admitted for pain control and medical treatment of her end-stage renal disease. She was seen in consultation by Dr. Mosetta PigeonHarmeet Singh and his consultation note is as noted on the chart.  He ordered dialysis at the appropriate times and final dialysis was performed on the day of discharge on 05/29/2012. The patient remained stable throughout the hospital course.   She is to be discharged back to the skilled nursing facility on 05/29/2012. Her medications are essentially the same as her preop medications and she should maintain her preop dialysis schedule. She has [name] soft bandage on the left upper extremity. This should be changed only if it becomes soiled or wet. Antibiotics should be continued to be given at dialysis until further notice. She is to return to the office to see Dr. Katrinka BlazingSmith in 8 days and hopefully at that point we could remove her staples.    ____________________________ Clare Gandyhristopher E. Tahje Borawski, MD ces:ct D: 05/29/2012 12:23:43 ET T: 05/29/2012 12:54:41 ET JOB#: [id number]  cc: Clare Gandyhristopher E. Laruen Risser, MD,  <Dictator> Clare GandyHRISTOPHER E Luddie Boghosian MD ELECTRONICALLY SIGNED 05/30/2012 7:29

## 2014-08-15 NOTE — Op Note (Signed)
PATIENT NAME:  [name], [name] MR#:  [id number] DATE OF BIRTH:  1960-11-05  DATE OF PROCEDURE:  02/07/2013  PREOPERATIVE DIAGNOSIS:  End-stage renal disease with functional permanent dialysis access and no longer needing catheter.   POSTOPERATIVE DIAGNOSIS:  End-stage renal disease with functional permanent dialysis access and no and no longer needing catheter.   PROCEDURE PERFORMED:  Removal of right side Hickman catheter.   SURGEON:  Levonne Carreras N. Laray Rivkin PA-C.   ANESTHESIA:  Local.   ESTIMATED BLOOD LOSS:  Minimal.   INDICATION FOR THE PROCEDURE:  The patient is [name] 54 year old white female with end-stage renal disease. Her access is functional and she no longer needs the Hickman catheter. This will be removed.   DESCRIPTION OF THE PROCEDURE:  The patient is brought to the Vascular Interventional Radiology area and positioned supine. The right neck and chest and the existing catheter were sterilely prepped and draped and [name] sterile surgical field was created. The area was locally anesthetized copiously with 1% lidocaine. [name] small incision was made with an 11 blade at catheter exit site. Hemostats were used to help dissect out the cuff and transect the fibrous sheath connected to the cuff. The catheter was then removed in its entirety. No difficulty with gentle traction. Pressure was held at the base of the neck. [name] sterile dressing was placed. The patient tolerated the procedure well.   ____________________________ Hoyle Sauerhelsea N. Sanaii Caporaso, PA-C cnh:jm D: 02/07/2013 09:39:00 ET T: 02/07/2013 10:17:17 ET JOB#: [id number]  cc: Hoyle Sauerhelsea N. Vergil Burby, PA-C, <Dictator> Greidys Deland N Orell Hurtado PA ELECTRONICALLY SIGNED 02/12/2013 9:07
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
## 2014-08-17 NOTE — Op Note (Signed)
PATIENT NAME:  [name], Deneene A MR#:  [id number] DATE OF BIRTH:  1960-06-20  DATE OF PROCEDURE:  08/01/2011  PREOPERATIVE DIAGNOSIS: Chronic calculus cholecystitis.   POSTOPERATIVE DIAGNOSIS: Chronic calculus cholecystitis.   PROCEDURES PERFORMED:  1. Attempted laparoscopic cholecystectomy, conversion to open cholecystectomy.  2. Open wedge liver biopsy.  SURGEON: Jatia Musa A. Egbert GaribaldiBird, MD   ASSISTANT: None.   ANESTHESIA: General endotracheal.   FINDINGS: The gallbladder was intensely scarified in a chronic fashion. The hepatoduodenal ligament was foreshortened and scarred. There were multiple small stones and sludge within the gallbladder. The liver was massively enlarged.    ESTIMATED BLOOD LOSS: 75 mL.   DRAINS: Jackson-Pratt in gallbladder fossa.   DESCRIPTION OF PROCEDURE: With the patient in the supine position and the left arm tucked at her side, the patient's abdomen was widely prepped and draped with ChloraPrep solution. Time-out was observed. Perioperative antibiotics and DVT prophylaxis being administered.   A 12 mm blunt Hassan trocar was placed through an infraumbilical transversely oriented skin incision with stay sutures being passed through the fascia. Pneumoperitoneum was established. A 5 mm Bladeless trocar was placed in the epigastric region. Two 5-mm ports were then placed in the right subcostal margin lateral to the rectus sheath.   The liver was significantly enlarged. The gallbladder was nearly intrahepatic. The gallbladder was grasped along its fundus and elevated superiorly. This created a crevice within the right lobe of the liver where the gallbladder was intrahepatic. Lateral traction was then placed on Hartmann's pouch. The area of the cystic duct and the hepatoduodenal ligament was involved in an intense inflammatory chronic reaction. Attempts at critical view of cholecystectomy laparoscopic was unsuccessful. After 30 minutes of attempt, the laparoscopic approach was  abandoned and a primary right upper quadrant transversely oriented skin incision was fashioned with a scalpel and carried through musculofascial layers with electrocautery. The Bookwalter retractor was placed. Two laparotomy tapes were placed behind the right lobe of the liver. The transverse colon was then reflected inferiorly. The left lobe of the liver was retracted with the Bookwalter apparatus. The gallbladder was then taken down dome down with electrocautery. It was largely intrahepatic.   Dissection within the triangle of Calot demonstrated again significant scarring. The neck of the gallbladder eventually was identified. No further dissection could be undertaken safely as had already developed a hole within the anterior surface of the gallbladder. A TA-30 stapler was placed across the neck of the gallbladder and fired and the specimen was amputated off the stapler. Several stitches of 2-0 Vicryl were placed to reinforce the staple line at its corners. The specimen was handed off the field. A wedge liver biopsy was performed with the scalpel and controlled with hemostasis utilizing mattress-type sutures of 0 Vicryl suture. Hot cautery was used to obtain hemostasis. A 19 mm Blake drain was directed into the space and exited the lower abdomen. The retractor was then removed. The infraumbilical fascial defect was closed with an additional figure-of-eight #0 Vicryl suture in vertical orientation, the existing stay sutures being tied to each other. With lap and needle count correct x2, the abdominal fascia was then closed in two layers with running #1 Vicryl sutures from the extremes of the wound. Muscular layer was irrigated. Hemostasis was ensured with point cautery. The anterior fascia was closed in a similar fashion. Subcutaneous tissues were irrigated. Skin edges were reapproximated utilizing a skin stapler. A sterile occlusive dressing was placed. The patient was then subsequently extubated and taken to  the recovery room  in stable and satisfactory condition.   ____________________________ Redge Gainer Egbert Garibaldi, MD mab:drc D: 08/02/2011 10:20:41 ET T: 08/02/2011 10:33:27 ET JOB#: [id number]  cc: Loraine Leriche A. Egbert Garibaldi, MD, <Dictator> Lennyx Verdell A Aliyanah Rozas MD ELECTRONICALLY SIGNED 08/08/2011 7:18

## 2014-08-17 NOTE — Consult Note (Signed)
54 year old female with multiple medical illnesses most significantly DM and ESRD on dialysis. and xrays reviewed and patient examined. gangrene of distal phalanx extending to joint of right long finger. Sausage type swelling of remainder of finger with alternating areas of redness and darkness. Mild to moderate dorsal hand swelling with same red type discoloration. I cannot palpate and distincet abscess. 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  This does not appear to me to be an acute event. At this point I don't believe any more imaging studies will help us much. Agree with present antibiotics, and will dress finger with silvadene, and soft dressing. I will follow along daily, She will need amputation of at least part of the long finger depending upon where the demarcation level is.  Electronic Signatures: Clare GandySmith, Cam Harnden E (MD)  (Signed on 10-May-13 16:47)  Authored  Last Updated: 10-May-13 16:47 by Clare GandySmith, Berry Gallacher E (MD)

## 2014-08-17 NOTE — H&P (Signed)
PATIENT NAME:  [name], [name] MR#:  [id number] DATE OF BIRTH:  11-03-1960  DATE OF ADMISSION:  07/30/2011  CHIEF COMPLAINT: Right upper quadrant pain and right flank pain.   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 She stated that she has been having this pain on and off for several months, which has been accompanied by early morning gagging, which she has attributed to her diabetes. However, the pain started yesterday morning and has been relentless causing her to miss dialysis today. She has had two episodes of vomiting today, one at home and one in the ER. She has denied fevers, but feels subjectively chilled and tremulous after her vomiting episodes. She has no history of postprandial aggravation of pain, but does note that the pain is made worse by moving and walking.   The patient's primary care physician is [name], in [place]. Her nephrologist is Dr. Dr. Arrie Aran in [place].   PAST MEDICAL HISTORY:  1. End-stage kidney disease, on hemodialysis since April 2012. 2. Hyperlipidemia. 3. Hypertension. 4. 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  5. History of normal colonoscopy several years ago during an annual exam.   MEDICATIONS:  1. Insulin Humalog 10 units daily.  2. Bystolic 5 mg daily.  PAST SURGICAL HISTORY:  1. Left brachiocephalic fistula, placed by Dr. Levora Dredge in June 2012. 2. 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   ALLERGIES: She has an allergy to penicillin which causes [name] rash.   LAST HOSPITALIZATION: April 2012 at which time hemodialysis was initiated for  end-stage kidney disease.   FAMILY HISTORY: Diabetes and congestive heart failure and stroke in mom. Father had early heart disease and died at age 72 from an acute myocardial infarction. [name] maternal uncle had colon cancer in his 40s.   SOCIAL HISTORY: Nonsmoker, nondrinker. No illicits. She does use [name] prosthesis and [name] cane for ambulation. Otherwise independent of all activities of daily living.   REVIEW OF SYSTEMS: Positive for subjective fever and chills. No weight changes. No changes in vision. No history of glaucoma or cataracts. No history of seasonal rhinitis, epistaxis, or postnasal drip or sinus pain. She denies any trouble swallowing. No history of cough, but she has noted some shortness of breath over the last 24 hours. She denies chest pain. No history of orthopnea or edema. No history of palpitations. She has had nausea and vomiting without diarrhea accompanied by abdominal pain, per history of present illness, in the last 24 hours. She denies dysuria. She does still make urine. She has no history of thyroid problems or heat or cold intolerance. No history of anemia or easy bruising or bleeding. She does have chronic right knee pain secondary to arthritis and has [name] history of [name] bone fracture several weeks ago on the right lateral leg which is healing nicely, per orthopedics followup. She has no history of numbness, weakness, or dysarthria. Psych review is negative for anxiety and insomnia.   PHYSICAL EXAMINATION:   GENERAL: This is [name] well nourished, middle-aged female who is currently in no apparent distress. She just received Dilaudid and Phenergan.   VITALS: Initial blood pressure on admission to the ER was  172/96 and repeat 143/84, pulse 99 and regular, temperature 97.4, respirations 22, and saturating 97% on room air. Pain scale is 6/10 at its worst.   HEENT: Pupils are equal, round, and reactive to light. Extraocular movements are intact. Sclerae are anicteric. Oropharynx is in good  condition.   NECK: Supple without lymphadenopathy, JVD, or thyromegaly.   LUNGS: Notable for decreased breath sounds bilaterally without rales or rhonchi. Chest wall is nontender.   CARDIOVASCULAR: Regular rate and rhythm with no murmurs, rubs, or gallops. She does have [name] good femoral pulse on the right and no right lower extremity edema. She is left below-knee amputation.   ABDOMEN: Soft. She is tender in the right upper quadrant and right CVA area. There is no evidence of hepatosplenomegaly. Bowel sounds are present, but quiet.   GYN: Deferred.  MS: Deferred. She is moving all extremities, however.   SKIN: Skin is warm and dry without rashes. I did note several excoriations on her back, which were self-inflicted due to chronically dry skin.   LYMPH: There is no cervical, axillary, inguinal, or supraclavicular lymphadenopathy.   NEUROLOGICAL: Grossly nonfocal and alert and oriented to person, place, and time.   ADMISSION LABS/STUDIES: Sodium 135, potassium 5.5, chloride 98, bicarbonate 26, BUN 42, creatinine 6.23, and glucose 137. White count 12.5, hemoglobin 11.4, and platelets 293. Total bilirubin is elevated at 1.5, alkaline phosphatase is elevated at 324, and AST and ALT are normal at 24 and 19.   Urinalysis is normal with [name] specific gravity of 1.015.   UPT is negative.   EKG shows normal sinus rhythm.   RADIOLOGY: She has had a PA and lateral chest x-ray which shows bilateral interstitial opacities.   CT of chest, abdomen and pelvis shows diffuse anasarca.  Abdominal ultrasound shows gallstones with [name] negative Murphy's sign, but does show gallbladder wall thickening and pericolic cystic fluid.    ASSESSMENT AND PLAN:  1. Acute cholecystitis, presumed, given history and exam. We will admit to the hospitalist service per general surgery request and have Dr. Egbert Garibaldi see her to plan cholecystectomy once she has been dialyzed and her acute inflammation has subsided. Given her  penicillin allergy, I have started Cipro and Flagyl. The patient is n.p.o. She is considered moderate risk for surgical complications due to multiple cardiac risk factors. I have started IV Lopressor.  2. Hyperkalemia secondary to end-stage kidney disease with failure to dialyze today. The patient was given IV insulin in the ER. I will add IV Lasix and repeat [name] potassium this afternoon. She does need dialysis and I am in the process of contacting nephrology to resume dialysis schedule.  3. End-stage kidney disease secondary to diabetic nephropathy. Resume dialysis schedule of Tuesday, Thursday, and Saturday. Her regular dialysis center is [facility], under the direction of Dr. Arrie Aran.  She is wondering if she can transfer to [place]. I will leave this up to the nephrologist.  4. Diabetes mellitus. She is controlled on Humalog 10 units daily. She states that her last hemoglobin A1c three months ago was fine. We will repeat that in the morning with [name] fasting lipid panel. I have put her on sliding scale since she is n.p.o.  5. Hypertension. The patient takes Bystolic as an outpatient. She has been given IV Lopressor here for management and perioperative beta-blockade.   ESTIMATED TIME OF CARE: 60 minutes. ____________________________ Duncan Dull, MD tt:slb D: 07/30/2011 14:00:37 ET T: 07/30/2011 14:27:07 ET JOB#: [id number]  cc: Duncan Dull, MD, <Dictator> Shiro Ellerman  MD ELECTRONICALLY SIGNED 09/01/2011 18:46

## 2014-08-17 NOTE — Consult Note (Signed)
Dressing change today. OK for discharge home today. Patient to soak hand in diluted peroxide daily, and then reapply bandage. Patient should be on antibiotic as ordered by Dr Leavy CellaBlocker and also an analgesic. Patient needs return appt to see me on Monday.  Electronic Signatures: Clare GandySmith, Darian Ace E (MD)  (Signed on 15-May-13 12:12)  Authored  Last Updated: 15-May-13 12:12 by Clare GandySmith, Chudney Scheffler E (MD)

## 2014-08-17 NOTE — Op Note (Signed)
PATIENT NAME:  [name], [name] MR#:  [id number] DATE OF BIRTH:  [birth year]  DATE OF PROCEDURE:  10/21/2011  PREOPERATIVE DIAGNOSES:  1. Complication of AV dialysis device with poor dialysis flows difficulty with cannulation.  2. End-stage renal disease requiring hemodialysis.  3. 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   POSTOPERATIVE DIAGNOSES:   1. Complication of AV dialysis device with poor dialysis flows difficulty with cannulation.  2. End-stage renal disease requiring hemodialysis.  3. 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  PROCEDURES PERFORMED:  1. Contrast injection left brachiocephalic fistula.  2. Percutaneous transluminal angioplasty, left brachial artery proximal lesion to 3 mm.  3. Percutaneous transluminal angioplasty arterial anastomosis to 7 mm.  4. Percutaneous transluminal angioplasty venous cannulation site to 8 mm.   PROCEDURE PERFORMED BY: Renford Dills, MD   SEDATION: Versed 2 mg plus fentanyl 50 mcg administered IV. Continuous ECG, pulse oximetry, and cardiopulmonary monitoring was performed throughout the entire procedure by the interventional radiology nurse. Total sedation time is 45 minutes.   ACCESS: 6 French sheath, retrograde direction, left arm AV fistula.   CONTRAST USED: Isovue 25 mL.   FLUORO TIME: 2.1 minutes.   INDICATIONS: Ms. Berwanger is [name] 54 year old woman with [name] left arm brachiocephalic fistula who has been having increasing problems with cannulation as well as poor flows and worsening KT/v. Risks and benefits for angiography and intervention were reviewed. All questions have been answered. The patient agrees to proceed.   PROCEDURE: The patient is taken to Special Procedures and placed in the supine position. After adequate sedation is achieved, left arm is extended palm upward and prepped and draped in [name] sterile fashion. 1% lidocaine is infiltrated in the soft tissues. Ultrasound is placed in  [name] sterile sleeve. Fistula is identified high up near the deltoid insertion. Fistula is compressible and echolucent indicating patency. Image is recorded for the permanent record. Under direct ultrasound visualization, micropuncture needle is inserted, microwire, J-wire followed by [name] 6 [place] sheath. Floppy Glidewire and KMP catheter are then negotiated through the fistula into the arterial system and the KMP catheter is advanced up to the axillary artery. Hand injection of contrast is then used to demonstrate the brachial artery fistula anastomosis in the proximal half of the fistula. After review of the images, there is [name] high-grade stenosis noted in the brachial artery. There is [name] separate and distinct stenosis noted in the arterial anastomosis and there is [name] stenosis noted at the venous cannulation site. 3000 units of heparin is given. Magic torque wire is then advanced through the KMP catheter. KMP catheter is removed and [name] 3 x 10 balloon is advanced into the brachial artery. Angioplasty to 10 atmospheres for one minute is performed.   [name] 6 x 2 Conquest balloon is then advanced across the arterial anastomosis and inflated to 24 atmospheres for one minute. Follow-up angiography with compression demonstrates resolution of these two lesions with less than 5% residual stenosis. Re-evaluation of the venous lesion does show there is significant tortuosity and this also appears to be [name] weblike lesion which clearly is flow-limiting and, therefore, an 8 x 6 Rival balloon is advanced across this area. It is very close to the access site and during the balloon inflation the sheath is actually backed out of the vein. There is some extravasation. This is noted on hand injection of contrast. Balloon is then removed over the wire. The dilator is advanced over the wire with the sheath and the sheath is repositioned appropriately intravascularly.  Once again, hand injection of contrast is utilized and this demonstrates that the  venous lesion has been well treated.   Purse-string suture of 4-0 Monocryl is placed and the sheath is removed and there are no immediate complications.   SUMMARY: There is successful treatment of the brachial artery lesion as well as the arterial anastomosis lesion. Brachial artery is treated to 3 mm. The anastomotic lesion is treated to 6 mm. The venous lesion at the cannulation site is treated to 8 mm.   Successful salvage of left arm brachiocephalic fistula.  ____________________________ Renford DillsGregory G. Pio Eatherly, MD ggs:drc D: 10/21/2011 12:48:00 ET T: 10/21/2011 13:25:45 ET JOB#: [id number] cc: Renford DillsGregory G. Keyonia Gluth, MD, <Dictator>, Marcine Matarharles W. Phillips Jr., MD Renford DillsGREGORY G Lealon Vanputten MD ELECTRONICALLY SIGNED 10/22/2011 12:52
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
## 2014-08-17 NOTE — Consult Note (Signed)
PATIENT NAME:  [name], Anglia A MR#:  [id number] DATE OF BIRTH:  11-11-1960  DATE OF CONSULTATION:  07/30/2011  REFERRING PHYSICIAN:   CONSULTING PHYSICIAN:  Izzac Rockett A. Egbert GaribaldiBird, MD  PRIMARY CARE PHYSICIAN: Dr. L[place]enderharles [place] in [place]  NEPHROLOGIST: [facility]   REASON FOR CONSULTATION: Question of acute calculus cholecystitis.   HISTORY: 54 year old white female with history of end-stage renal failure, diabetic nephropathy, neuropathy, history of below-the-knee amputation who presents to the Emergency Room with several day history of right flank and right-sided upper abdominal pain, epigastric pain, nausea and vomiting. This has been going on and off for several months. Associated with early morning nausea and some dry heaves. She does not think that it is related to eating. Work-up in the Emergency Room included ultrasound which demonstrated gallstones and possible polyps and CT scan without contrast which demonstrates questionable pericholecystic fluid and stones. Patient missed her dialysis today. She is being admitted to the medical service. Surgical service has been consulted. She has had no fevers, no jaundice, no diarrhea, no sick contacts   MEDICATIONS:  1. Insulin. 2. Bystolic.   PAST MEDICAL HISTORY:  1. End-stage renal failure. 2. Hyperlipidemia. 3. Hypertension. 4. Diabetes. She has been on dialysis for one year.   PAST SURGICAL HISTORY:  1. Multiple shunts. 2. Left below-the-knee amputation.   ALLERGIES: Penicillin causes a rash.   FAMILY HISTORY: Significant for diabetes and heart failure, stroke, heart disease.   SOCIAL HISTORY: Does not smoke. Does not drink. No IV drug use. She has a left below-the-knee amputation prosthesis. Independent in activities of daily living.   REVIEW OF SYSTEMS: As described above.   PHYSICAL EXAMINATION:  GENERAL: This is an ill-appearing white female with no obvious scleral icterus sitting in a wheelchair with  her husband at her side. Affect is normal.   LUNGS: Clear.   HEART: Regular rate and rhythm.   ABDOMEN: Tender in the right upper quadrant to deep palpation consistent with a Murphy's sign.   EXTREMITIES: She has a left upper extremity AV shunt which appears to be working nicely.   Neuro:  grossly normal exam.  Psych:  a nd o times 4. normal mood and affect.  LABORATORY, DIAGNOSTIC, AND RADIOLOGICAL DATA: Sodium 135, potassium 5.5, white count 12.5, alkaline phosphatase 324, AST and ALT are 24 and 19, total bilirubin 1.5. Urinalysis is unremarkable. EKG normal sinus rhythm. Review of x-ray PA and lateral demonstrates bilateral interstitial opacities, possibly edema. CT scan of the abdomen demonstrates diffuse anasarca and stones and gallbladder. Abdominal ultrasound demonstrates gallstones with gallbladder wall thickening and pericholecystic fluid.   IMPRESSION: This patient has what looks like acute calculus cholecystitis in the setting of renal failure, obesity, and diabetes. She was started on Cipro and Flagyl by the medical service.  She seems to have severe symptoms related to her biliary system.  RECS: We will correct her hyperkalemia and get nephrology to see her for institution of dialysis while in the hospital. We will continue her Bystolic for her hypertension and treat her with intravenous Lopressor as needed for perioperative beta blockade. I discussed with her and her husband laparoscopic cholecystectomy later this week after medical stabilization. All their questions were answered. I will return in the morning.   ____________________________ Redge GainerMark A. Egbert GaribaldiBird, MD mab:cms D: 07/31/2011 09:58:00 ET T: 08/01/2011 10:10:51 ET  JOB#: [id number] cc: Loraine LericheMark A. Egbert GaribaldiBird, MD, <Dictator> Raynald KempMARK A Vernon Ariel MD ELECTRONICALLY SIGNED 08/08/2011 7:57

## 2014-08-17 NOTE — Op Note (Signed)
PATIENT NAME:  [name], [name] MR#:  [id number] DATE OF BIRTH:  27-Jul-1960  DATE OF PROCEDURE:  09/04/2011  PREOPERATIVE DIAGNOSES:  1. Gangrenous right long finger.  2. Deep abscess palm, right hand.   PROCEDURES:  1. Irrigation and debridement of deep abscess palm, right hand.  2. Amputation of right long finger.   SURGEON: Clare Gandyhristopher E. Jhon Mallozzi, MD  ANESTHESIA: General.   COMPLICATIONS: None.   DESCRIPTION OF PROCEDURE: After adequate induction of general anesthesia, the right upper extremity is thoroughly prepped with Hibiclens and ChloraPrep and draped in standard sterile fashion. Zigzag incision is first made in the palm in the midportion extending from the distal carpal tunnel to the base of the long finger. The dissection is carefully carried down using loupe magnification. There is seen to be [name] deep abscess within the palmar space. Culture is sent. Thorough debridement is performed. The wound is then thoroughly irrigated multiple times. Incision is then extended out to the base of the long finger. The dissection carried down to the flexor tendons which are resected. The rongeur was used to complete the bone resection at the midportion of the proximal phalanx. The remainder of the amputation is then performed through the neurovascular bundles and the dorsal skin and extensor mechanism. The wound is thoroughly irrigated multiple times. Specimen is sent to pathology. Partial closure of the finger wound at the base is performed with multiple 4-0 nylon sutures. Two large vessel loop drains are brought out through separate stab wound incisions. The skin is then loosely closed over this with 4-0 nylon sutures. Soft bulky dressing is applied. Patient is returned to recovery room in satisfactory condition having tolerated the procedure quite well.  ____________________________ Clare Gandyhristopher E. Yahir Tavano, MD ces:cms D: 09/04/2011 16:28:16 ET T: 09/05/2011 09:21:30 ET  JOB#: [id number] cc: Clare Gandyhristopher E.  Malan Werk, MD, <Dictator> Clare GandyHRISTOPHER E Mcgwire Dasaro MD ELECTRONICALLY SIGNED 09/06/2011 8:55

## 2014-12-05 ENCOUNTER — Other Ambulatory Visit: Payer: Self-pay | Admitting: *Deleted

## 2014-12-05 DIAGNOSIS — L97411 Non-pressure chronic ulcer of right heel and midfoot limited to breakdown of skin: Secondary | ICD-10-CM

## 2014-12-05 DIAGNOSIS — I739 Peripheral vascular disease, unspecified: Secondary | ICD-10-CM

## 2014-12-09 ENCOUNTER — Ambulatory Visit (INDEPENDENT_AMBULATORY_CARE_PROVIDER_SITE_OTHER)
Admission: RE | Admit: 2014-12-09 | Discharge: 2014-12-09 | Disposition: A | Discharge status: Home / Self Care | Payer: Medicare PPO | Source: Ambulatory Visit | Attending: Vascular Surgery | Admitting: Vascular Surgery

## 2014-12-09 DIAGNOSIS — I739 Peripheral vascular disease, unspecified: Secondary | ICD-10-CM

## 2014-12-09 DIAGNOSIS — L97411 Non-pressure chronic ulcer of right heel and midfoot limited to breakdown of skin: Secondary | ICD-10-CM

## 2014-12-09 DIAGNOSIS — E11621 Type 2 diabetes mellitus with foot ulcer: Secondary | ICD-10-CM | POA: Diagnosis not present

## 2014-12-10 ENCOUNTER — Inpatient Hospital Stay (HOSPITAL_COMMUNITY): Payer: Medicare PPO

## 2014-12-10 ENCOUNTER — Encounter (HOSPITAL_COMMUNITY): Payer: Self-pay | Admitting: Emergency Medicine

## 2014-12-10 ENCOUNTER — Inpatient Hospital Stay (HOSPITAL_COMMUNITY)
Admission: EM | Admit: 2014-12-10 | Discharge: 2014-12-13 | DRG: 638 | Disposition: A | Discharge status: Skilled Nursing Facility | Payer: Medicare PPO | Attending: Internal Medicine | Admitting: Internal Medicine

## 2014-12-10 DIAGNOSIS — E1151 Type 2 diabetes mellitus with diabetic peripheral angiopathy without gangrene: Secondary | ICD-10-CM | POA: Diagnosis present

## 2014-12-10 DIAGNOSIS — I69922 Dysarthria following unspecified cerebrovascular disease: Secondary | ICD-10-CM

## 2014-12-10 DIAGNOSIS — L03115 Cellulitis of right lower limb: Secondary | ICD-10-CM | POA: Diagnosis present

## 2014-12-10 DIAGNOSIS — Z880 Allergy status to penicillin: Secondary | ICD-10-CM

## 2014-12-10 DIAGNOSIS — Z79891 Long term (current) use of opiate analgesic: Secondary | ICD-10-CM | POA: Diagnosis not present

## 2014-12-10 DIAGNOSIS — E46 Unspecified protein-calorie malnutrition: Secondary | ICD-10-CM | POA: Diagnosis present

## 2014-12-10 DIAGNOSIS — Z89112 Acquired absence of left hand: Secondary | ICD-10-CM | POA: Diagnosis not present

## 2014-12-10 DIAGNOSIS — M549 Dorsalgia, unspecified: Secondary | ICD-10-CM | POA: Diagnosis present

## 2014-12-10 DIAGNOSIS — L97519 Non-pressure chronic ulcer of other part of right foot with unspecified severity: Secondary | ICD-10-CM | POA: Diagnosis present

## 2014-12-10 DIAGNOSIS — Z885 Allergy status to narcotic agent status: Secondary | ICD-10-CM | POA: Diagnosis not present

## 2014-12-10 DIAGNOSIS — E11621 Type 2 diabetes mellitus with foot ulcer: Principal | ICD-10-CM | POA: Diagnosis present

## 2014-12-10 DIAGNOSIS — Z992 Dependence on renal dialysis: Secondary | ICD-10-CM | POA: Diagnosis not present

## 2014-12-10 DIAGNOSIS — E669 Obesity, unspecified: Secondary | ICD-10-CM | POA: Diagnosis present

## 2014-12-10 DIAGNOSIS — E119 Type 2 diabetes mellitus without complications: Secondary | ICD-10-CM

## 2014-12-10 DIAGNOSIS — D649 Anemia, unspecified: Secondary | ICD-10-CM | POA: Diagnosis present

## 2014-12-10 DIAGNOSIS — N186 End stage renal disease: Secondary | ICD-10-CM | POA: Diagnosis present

## 2014-12-10 DIAGNOSIS — I120 Hypertensive chronic kidney disease with stage 5 chronic kidney disease or end stage renal disease: Secondary | ICD-10-CM | POA: Diagnosis present

## 2014-12-10 DIAGNOSIS — L02619 Cutaneous abscess of unspecified foot: Secondary | ICD-10-CM | POA: Insufficient documentation

## 2014-12-10 DIAGNOSIS — E1122 Type 2 diabetes mellitus with diabetic chronic kidney disease: Secondary | ICD-10-CM | POA: Diagnosis present

## 2014-12-10 DIAGNOSIS — E11628 Type 2 diabetes mellitus with other skin complications: Secondary | ICD-10-CM | POA: Diagnosis present

## 2014-12-10 DIAGNOSIS — E1129 Type 2 diabetes mellitus with other diabetic kidney complication: Secondary | ICD-10-CM | POA: Diagnosis not present

## 2014-12-10 DIAGNOSIS — Z6831 Body mass index (BMI) 31.0-31.9, adult: Secondary | ICD-10-CM | POA: Diagnosis not present

## 2014-12-10 DIAGNOSIS — Z8674 Personal history of sudden cardiac arrest: Secondary | ICD-10-CM | POA: Diagnosis not present

## 2014-12-10 DIAGNOSIS — E1169 Type 2 diabetes mellitus with other specified complication: Secondary | ICD-10-CM | POA: Insufficient documentation

## 2014-12-10 DIAGNOSIS — G8929 Other chronic pain: Secondary | ICD-10-CM | POA: Diagnosis present

## 2014-12-10 DIAGNOSIS — D631 Anemia in chronic kidney disease: Secondary | ICD-10-CM | POA: Diagnosis present

## 2014-12-10 DIAGNOSIS — IMO0001 Reserved for inherently not codable concepts without codable children: Secondary | ICD-10-CM | POA: Insufficient documentation

## 2014-12-10 DIAGNOSIS — L089 Local infection of the skin and subcutaneous tissue, unspecified: Secondary | ICD-10-CM | POA: Diagnosis not present

## 2014-12-10 DIAGNOSIS — Z89429 Acquired absence of other toe(s), unspecified side: Secondary | ICD-10-CM | POA: Diagnosis not present

## 2014-12-10 DIAGNOSIS — Z833 Family history of diabetes mellitus: Secondary | ICD-10-CM | POA: Diagnosis not present

## 2014-12-10 DIAGNOSIS — Z79899 Other long term (current) drug therapy: Secondary | ICD-10-CM

## 2014-12-10 DIAGNOSIS — L03119 Cellulitis of unspecified part of limb: Secondary | ICD-10-CM

## 2014-12-10 DIAGNOSIS — I10 Essential (primary) hypertension: Secondary | ICD-10-CM | POA: Diagnosis not present

## 2014-12-10 DIAGNOSIS — R700 Elevated erythrocyte sedimentation rate: Secondary | ICD-10-CM | POA: Diagnosis present

## 2014-12-10 DIAGNOSIS — L97509 Non-pressure chronic ulcer of other part of unspecified foot with unspecified severity: Secondary | ICD-10-CM | POA: Diagnosis present

## 2014-12-10 DIAGNOSIS — Z89512 Acquired absence of left leg below knee: Secondary | ICD-10-CM

## 2014-12-10 DIAGNOSIS — Z881 Allergy status to other antibiotic agents status: Secondary | ICD-10-CM | POA: Diagnosis not present

## 2014-12-10 HISTORY — DX: Cerebral infarction, unspecified: I63.9

## 2014-12-10 LAB — GLUCOSE, CAPILLARY: Glucose-Capillary: 395 mg/dL — ABNORMAL HIGH (ref 65–99)

## 2014-12-10 LAB — BASIC METABOLIC PANEL
Anion gap: 13 (ref 5–15)
BUN: 7 mg/dL (ref 6–20)
CALCIUM: 9.9 mg/dL (ref 8.9–10.3)
CO2: 32 mmol/L (ref 22–32)
CREATININE: 2.38 mg/dL — AB (ref 0.44–1.00)
Chloride: 93 mmol/L — ABNORMAL LOW (ref 101–111)
GFR calc non Af Amer: 22 mL/min — ABNORMAL LOW (ref >60)
GFR, EST AFRICAN AMERICAN: 26 mL/min — AB (ref >60)
Glucose, Bld: 134 mg/dL — ABNORMAL HIGH (ref 65–99)
Potassium: 3.3 mmol/L — ABNORMAL LOW (ref 3.5–5.1)
Sodium: 138 mmol/L (ref 135–145)

## 2014-12-10 LAB — CBC
HEMATOCRIT: 35.2 % — AB (ref 36.0–46.0)
Hemoglobin: 11.8 g/dL — ABNORMAL LOW (ref 12.0–15.0)
MCH: 33.1 pg (ref 26.0–34.0)
MCHC: 33.5 g/dL (ref 30.0–36.0)
MCV: 98.9 fL (ref 78.0–100.0)
Platelets: 199 10*3/uL (ref 150–400)
RBC: 3.56 MIL/uL — ABNORMAL LOW (ref 3.87–5.11)
RDW: 14.2 % (ref 11.5–15.5)
WBC: 13.2 10*3/uL — ABNORMAL HIGH (ref 4.0–10.5)

## 2014-12-10 LAB — I-STAT CG4 LACTIC ACID, ED: LACTIC ACID, VENOUS: 1.55 mmol/L (ref 0.5–2.0)

## 2014-12-10 MED ORDER — PIPERACILLIN-TAZOBACTAM 3.375 G IVPB
3.3750 g | Freq: Three times a day (TID) | INTRAVENOUS | Status: DC
  Administered 2014-12-10 – 2014-12-11 (×2): 3.375 g via INTRAVENOUS
  Filled 2014-12-10 (×4): qty 50

## 2014-12-10 MED ORDER — HYDROMORPHONE HCL 1 MG/ML IJ SOLN
1.0000 mg | Freq: Once | INTRAMUSCULAR | Status: AC
  Administered 2014-12-10: 1 mg via INTRAVENOUS
  Filled 2014-12-10: qty 1

## 2014-12-10 MED ORDER — ONDANSETRON HCL 4 MG/2ML IJ SOLN
4.0000 mg | Freq: Four times a day (QID) | INTRAMUSCULAR | Status: DC | PRN
  Administered 2014-12-11: 4 mg via INTRAVENOUS
  Filled 2014-12-10: qty 2

## 2014-12-10 MED ORDER — PANTOPRAZOLE SODIUM 40 MG PO TBEC
40.0000 mg | DELAYED_RELEASE_TABLET | Freq: Two times a day (BID) | ORAL | Status: DC
  Administered 2014-12-10 – 2014-12-13 (×5): 40 mg via ORAL
  Filled 2014-12-10 (×6): qty 1

## 2014-12-10 MED ORDER — METOCLOPRAMIDE HCL 5 MG PO TABS
5.0000 mg | ORAL_TABLET | Freq: Three times a day (TID) | ORAL | Status: DC
Start: 2014-12-10 — End: 2014-12-13
  Administered 2014-12-10 – 2014-12-13 (×9): 5 mg via ORAL
  Filled 2014-12-10 (×9): qty 1

## 2014-12-10 MED ORDER — VANCOMYCIN HCL IN DEXTROSE 1-5 GM/200ML-% IV SOLN
1000.0000 mg | Freq: Once | INTRAVENOUS | Status: AC
  Administered 2014-12-10: 1000 mg via INTRAVENOUS
  Filled 2014-12-10: qty 200

## 2014-12-10 MED ORDER — HYDROCODONE-ACETAMINOPHEN 5-325 MG PO TABS
1.0000 | ORAL_TABLET | Freq: Four times a day (QID) | ORAL | Status: DC | PRN
  Administered 2014-12-11 – 2014-12-12 (×4): 2 via ORAL
  Filled 2014-12-10 (×4): qty 2

## 2014-12-10 MED ORDER — ADULT MULTIVITAMIN W/MINERALS CH
1.0000 | ORAL_TABLET | Freq: Every day | ORAL | Status: DC
  Administered 2014-12-11 – 2014-12-13 (×3): 1 via ORAL
  Filled 2014-12-10 (×3): qty 1

## 2014-12-10 MED ORDER — VANCOMYCIN HCL IN DEXTROSE 1-5 GM/200ML-% IV SOLN: 1000.0000 mg | INTRAVENOUS | Status: DC

## 2014-12-10 MED ORDER — SERTRALINE HCL 50 MG PO TABS
75.0000 mg | ORAL_TABLET | Freq: Every day | ORAL | Status: DC
  Administered 2014-12-11 – 2014-12-13 (×3): 75 mg via ORAL
  Filled 2014-12-10 (×6): qty 1

## 2014-12-10 MED ORDER — ENOXAPARIN SODIUM 30 MG/0.3ML ~~LOC~~ SOLN
30.0000 mg | SUBCUTANEOUS | Status: DC
  Administered 2014-12-10 – 2014-12-12 (×3): 30 mg via SUBCUTANEOUS
  Filled 2014-12-10 (×3): qty 0.3

## 2014-12-10 MED ORDER — MORPHINE SULFATE (PF) 2 MG/ML IV SOLN
1.0000 mg | INTRAVENOUS | Status: DC | PRN
  Administered 2014-12-12: 1 mg via INTRAVENOUS
  Filled 2014-12-10: qty 1

## 2014-12-10 MED ORDER — ACETAMINOPHEN 325 MG PO TABS: 650.0000 mg | ORAL_TABLET | Freq: Four times a day (QID) | ORAL | Status: DC | PRN

## 2014-12-10 MED ORDER — PROMETHAZINE HCL 25 MG PO TABS
25.0000 mg | ORAL_TABLET | Freq: Four times a day (QID) | ORAL | Status: DC | PRN
  Administered 2014-12-13: 25 mg via ORAL
  Filled 2014-12-10: qty 1

## 2014-12-10 MED ORDER — ONDANSETRON HCL 4 MG PO TABS: 4.0000 mg | ORAL_TABLET | Freq: Four times a day (QID) | ORAL | Status: DC | PRN

## 2014-12-10 MED ORDER — GUAIFENESIN-CODEINE 100-10 MG/5ML PO SYRP: 30.0000 mL | ORAL_SOLUTION | Freq: Four times a day (QID) | ORAL | Status: DC | PRN

## 2014-12-10 MED ORDER — SODIUM CHLORIDE 0.9 % IJ SOLN
3.0000 mL | Freq: Two times a day (BID) | INTRAMUSCULAR | Status: DC
  Administered 2014-12-10: 3 mL via INTRAVENOUS

## 2014-12-10 MED ORDER — INSULIN ASPART 100 UNIT/ML ~~LOC~~ SOLN
0.0000 [IU] | Freq: Three times a day (TID) | SUBCUTANEOUS | Status: DC
  Administered 2014-12-11 (×3): 2 [IU] via SUBCUTANEOUS
  Administered 2014-12-12: 3 [IU] via SUBCUTANEOUS
  Administered 2014-12-12: 1 [IU] via SUBCUTANEOUS
  Administered 2014-12-13: 5 [IU] via SUBCUTANEOUS

## 2014-12-10 MED ORDER — LORATADINE 10 MG PO TABS
10.0000 mg | ORAL_TABLET | Freq: Every day | ORAL | Status: DC
  Administered 2014-12-11 – 2014-12-13 (×3): 10 mg via ORAL
  Filled 2014-12-10 (×3): qty 1

## 2014-12-10 MED ORDER — ACETAMINOPHEN 650 MG RE SUPP: 650.0000 mg | Freq: Four times a day (QID) | RECTAL | Status: DC | PRN

## 2014-12-10 MED ORDER — LANTHANUM CARBONATE 500 MG PO CHEW
1000.0000 mg | CHEWABLE_TABLET | Freq: Three times a day (TID) | ORAL | Status: DC
  Administered 2014-12-11 – 2014-12-13 (×6): 1000 mg via ORAL
  Filled 2014-12-10 (×10): qty 2

## 2014-12-10 MED ORDER — METOPROLOL TARTRATE 12.5 MG HALF TABLET
12.5000 mg | ORAL_TABLET | Freq: Two times a day (BID) | ORAL | Status: DC
  Administered 2014-12-10 – 2014-12-13 (×5): 12.5 mg via ORAL
  Filled 2014-12-10 (×6): qty 1

## 2014-12-10 NOTE — ED Provider Notes (Signed)
CSN: [id number]     Arrival date & time 12/10/14  1701 History   First MD Initiated Contact with Patient 12/10/14 1710     Chief Complaint  Patient presents with  . Skin Ulcer  . Wound Check     (Consider location/radiation/quality/duration/timing/severity/associated sxs/prior Treatment) HPI Comments: Chronic R foot ulcer, began to bleed while at dialysis.   Patient is a 54 y.o. female presenting with wound check. The history is provided by the patient.  Wound Check This is a chronic problem. The current episode started more than 1 week ago. The problem occurs constantly. The problem has been gradually worsening. Pertinent negatives include no abdominal pain and no shortness of breath. Nothing aggravates the symptoms. Nothing relieves the symptoms. She has tried nothing for the symptoms.    Past Medical History  Diagnosis Date  . Hypertension   . Anemia   . Diabetes mellitus   . Peripheral vascular disease   . Hemodialysis patient 03/15/11    "Tues; Thurs; Sat; [facility]"  . Blood transfusion   . Chronic back pain   . Rash 03/15/11    "admitted me to find out what its' from; UE, waist, thighs, groin"  . Renal failure     hd 4/12   Past Surgical History  Procedure Laterality Date  . Insertion of dialysis catheter  07/29/10    right subclavian  . Arteriovenous graft placement  10/01/10    left upper arm  . Below knee leg amputation  2004    left  . Amputation  03/04/2011    Procedure: AMPUTATION DIGIT;  Surgeon: [name], MD;  Location: [facility] OR;  Service: Orthopedics;  Laterality: Right;  RT GREAT TOE AMP  . Cataract extraction w/ intraocular lens implant  12/2009    right eye  . Carpal tunnel release  ~ 2009    left wrist  . Tee without cardioversion  03/18/2011    Procedure: TRANSESOPHAGEAL ECHOCARDIOGRAM (TEE);  Surgeon: [name], MD;  Location[facility] ENDOSCOPY;  Service: Cardiovascular;  Laterality: N/A;   History reviewed. No pertinent family  history. Social History  Substance Use Topics  . Smoking status: Never Smoker   . Smokeless tobacco: Never Used  . Alcohol Use: No   OB History    No data available     Review of Systems  Constitutional: Negative for fever.  Respiratory: Negative for cough and shortness of breath.   Gastrointestinal: Negative for vomiting and abdominal pain.  All other systems reviewed and are negative.     Allergies  Codeine; Levofloxacin; and Penicillins  Home Medications   Prior to Admission medications   Medication Sig Start Date End Date Taking? Authorizing Provider  collagenase (SANTYL) ointment Apply 1 application topically daily. To left hand incision    Historical Provider, MD  guaiFENesin-codeine (ROBITUSSIN AC) 100-10 MG/5ML syrup Take 30 mLs by mouth every 6 (six) hours as needed for cough.    Historical Provider, MD  HYDROcodone-acetaminophen (NORCO/VICODIN) 5-325 MG per tablet Take 1 tablet by mouth every 4 (four) hours as needed for moderate pain.     Historical Provider, MD  HYDROcodone-acetaminophen (NORCO/VICODIN) 5-325 MG per tablet Take 1-2 tablets by mouth every 6 (six) hours as needed for moderate pain. 11/15/13   Cathren Laine, MD  lanthanum (FOSRENOL) 1000 MG chewable tablet Chew 1 tablet (1,000 mg total) by mouth 3 (three) times daily with meals. 03/21/12   Elenora Gamma, MD  loperamide (IMODIUM) 2 MG capsule Take 2  mg by mouth 4 (four) times daily as needed for diarrhea or loose stools.    Historical Provider, MD  loratadine (CLARITIN) 10 MG tablet Take 10 mg by mouth daily.    Historical Provider, MD  metoCLOPramide (REGLAN) 5 MG tablet Take 5 mg by mouth 4 (four) times daily.    Historical Provider, MD  metoprolol tartrate (LOPRESSOR) 25 MG tablet Take 12.5 mg by mouth 2 (two) times daily.    Historical Provider, MD  Multiple Vitamin (MULTIVITAMIN WITH MINERALS) TABS Take 1 tablet by mouth daily.    Historical Provider, MD  mupirocin ointment (BACTROBAN) 2 % Apply 1  application topically 2 (two) times daily. 11/07/13   Historical Provider, MD  pantoprazole (PROTONIX) 40 MG tablet Take 40 mg by mouth every 12 (twelve) hours.     Historical Provider, MD  promethazine (PHENERGAN) 25 MG tablet Take 25 mg by mouth every 6 (six) hours as needed for nausea.     Historical Provider, MD  sertraline (ZOLOFT) 100 MG tablet Take 75 mg by mouth daily.     Historical Provider, MD  sulfamethoxazole-trimethoprim (BACTRIM DS) 800-160 MG per tablet Take 1 tablet by mouth 2 (two) times daily. 11/05/13   Historical Provider, MD   BP 132/101 mmHg  Pulse 91  Temp(Src) 98.5 F (36.9 C) (Oral)  Resp 16  Ht  (1.626 m)  Wt 130 lb (58.968 kg)  BMI 22.30 kg/m2  SpO2 100% Physical Exam  Constitutional: She is oriented to person, place, and time. She appears well-developed and well-nourished. No distress.  HENT:  Head: Normocephalic and atraumatic.  Mouth/Throat: Oropharynx is clear and moist.  Eyes: EOM are normal. Pupils are equal, round, and reactive to light.  Neck: Normal range of motion. Neck supple.  Cardiovascular: Normal rate and regular rhythm.  Exam reveals no friction rub.   No murmur heard. Pulmonary/Chest: Effort normal and breath sounds normal. No respiratory distress. She has no wheezes. She has no rales.  Abdominal: Soft. She exhibits no distension. There is no tenderness. There is no rebound.  Musculoskeletal: Normal range of motion. She exhibits no edema.  Neurological: She is alert and oriented to person, place, and time.  Skin: Skin is warm. No rash noted. She is not diaphoretic.     Nursing note and vitals reviewed.       ED Course  Procedures (including critical care time) Labs Review Labs Reviewed  CBC - Abnormal; Notable for the following:    WBC 13.2 (*)    RBC 3.56 (*)    Hemoglobin 11.8 (*)    HCT 35.2 (*)    All other components within normal limits  BASIC METABOLIC PANEL - Abnormal; Notable for the following:    Potassium 3.3  (*)    Chloride 93 (*)    Glucose, Bld 134 (*)    Creatinine, Ser 2.38 (*)    GFR calc non Af Amer 22 (*)    GFR calc Af Amer 26 (*)    All other components within normal limits  GLUCOSE, CAPILLARY - Abnormal; Notable for the following:    Glucose-Capillary 395 (*)    All other components within normal limits  CULTURE, BLOOD (ROUTINE X 2)  CULTURE, BLOOD (ROUTINE X 2)  COMPREHENSIVE METABOLIC PANEL  CBC WITH DIFFERENTIAL/PLATELET  I-STAT CG4 LACTIC ACID, ED    Imaging Review Dg Foot Complete Right  12/10/2014   CLINICAL DATA:  Ulcers on the right foot for 2 weeks. History of prior amputations.  EXAM:  RIGHT FOOT COMPLETE - 3+ VIEW  COMPARISON:  Plain films of the right foot 01/22/2012.  FINDINGS: Again seen is postoperative change of resection of the great toe and bulk of the third ray. No bony destructive change or periosteal reaction is identified. There is some gas in the soft tissues along the lateral aspect of the foot at the level of the cuboid. Extensive vascular calcifications are noted.  IMPRESSION: Soft tissue gas in the lateral aspect of the foot at the level of the cuboid. Negative for plain film evidence of osteomyelitis.  Status post resection of the great toe at the bulk of the third ray.  Atherosclerosis.   Electronically Signed   By: Drusilla Kanner M.D.   On: 12/10/2014 19:39   I have personally reviewed and evaluated these images and lab results as part of my medical decision-making.   EKG Interpretation None      MDM   Final diagnoses:  Cellulitis of right foot    54 year old female here with a chronic foot ulcer. Began to believe about dialysis. Had full dialysis session. Has lost multiple digits, her left hand, and her left lower leg due to vascular locations of dialysis. He has a large wound on the right lateral foot. Largest of many ulcers has a purulent base. There is no drainage. There is surrounding redness concerning for cellulitis. She denies any  systemic symptoms. She's not currently on any medications for her foot and she does not receive wound care. Will check labs. Labs show leukocytosis. Will admit for antibiotics. Vancomycin given.   Elwin Mocha, MD 12/11/14 [phone]

## 2014-12-10 NOTE — Care Management Note (Addendum)
Case Management Note  Patient Details  Name: MONZERRAT WELLEN MRN: [id number] Date of Birth: 1961-02-19  Subjective/Objective:  54 y.o. F from [facility],  seen in the ED today for Diabetic Ulcer on Lateral Aspect Right Foot. Hx PVD. Hx Diabetes with HD (Tu, TR, Sat @ [facility])  since 2012). Hx L BKA, R Great Toe Amputation 2012. No previous Wound Ctr Treatments. CM consulted to arrange Outpt appointment. [facility] closed for today but Shauna Hugh,  RNCM will f/u on Thursday 8/18 and  appt will be made and pt will be contacted with information.                   Action/Plan: Will continue to follow.    Expected Discharge Date:                  Expected Discharge Plan:     In-House Referral:  Clinical Social Work  Discharge planning Services  CM Consult (Wound Care)  Post Acute Care Choice:    Choice offered to:     DME Arranged:    DME Agency:     HH Arranged:    HH Agency:     Status of Service:  In process, will continue to follow  Medicare Important Message Given:    Date Medicare IM Given:    Medicare IM give by:    Date Additional Medicare IM Given:    Additional Medicare Important Message give by:     If discussed at Long Length of Stay Meetings, dates discussed:    Additional Comments:  Yvone Neu, RN 12/10/2014, 6:50 PM

## 2014-12-10 NOTE — ED Notes (Signed)
Per EMS - pt comes from dialysis ([facility]) after ulcer on side of R foot opened up and started bleeding.  Staff controlled bleeding, wrapped it up, and sent to [facility] for wound check.  Pt states dialysis appointment was finished.  PT has had diabetic ulcer for 2 weeks.  EMS VS: 138/84, HR 92, RR 16, 99% RA.  Pt A&O x 4.

## 2014-12-10 NOTE — ED Notes (Signed)
MD at bedside. 

## 2014-12-10 NOTE — H&P (Signed)
[facility] History and Physical  [name] AVW:[id number] DOB: 1961/02/06 DOA: 12/10/2014  Referring physician: Dr. Elwin Mocha. PCP: Irena Cords, MD  Specialists: Nephrologist.  Chief Complaint: Oozing from the right foot wound.  HPI: [name] is a 54 y.o. 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 patient's wound was found to be oozing. Patient is also on antibiotics Bactrim which patient states was recently started. On exam patient's right foot has a wound on the lateral aspect with one ulcer measuring at least 1-1/2 cm into 1 cm in size with no active ooze at this time but does have some slough that are also 2 other ulcers on the lateral aspect of the leg. X-rays show some gas in the tissues. Patient does not look septic at this time. Patient has been admitted for further management. Denies any chest pain or shortness of breath. Patient is a poor historian.   Review of Systems: As presented in the history of presenting illness, rest negative.  Past Medical History  Diagnosis Date  . Hypertension   . Anemia   . Diabetes mellitus   . Peripheral vascular disease   . Hemodialysis patient 03/15/11    "Tues; Thurs; Sat; [facility]"  . Blood transfusion   . Chronic back pain   . Rash 03/15/11    "admitted me to find out what its' from; UE, waist, thighs, groin"  . Renal failure     hd 4/12   Past Surgical History  Procedure Laterality Date  . Insertion of dialysis catheter  07/29/10    right subclavian  . Arteriovenous graft placement  10/01/10    left upper arm  . Below knee leg amputation  2004    left  . Amputation  03/04/2011    Procedure: AMPUTATION DIGIT;  Surgeon: [name], MD;  Location: [facility] OR;  Service: Orthopedics;   Laterality: Right;  RT GREAT TOE AMP  . Cataract extraction w/ intraocular lens implant  12/2009    right eye  . Carpal tunnel release  ~ 2009    left wrist  . Tee without cardioversion  03/18/2011    Procedure: TRANSESOPHAGEAL ECHOCARDIOGRAM (TEE);  Surgeon: [name], MD;  Location: [facility] ENDOSCOPY;  Service: Cardiovascular;  Laterality: N/A;   Social History:  reports that she has never smoked. She has never used smokeless tobacco. She reports that she does not drink alcohol or use illicit drugs. Where does patient live home. Can patient participate in ADLs? Yes.  Allergies  Allergen Reactions  . Codeine Hives  . Levofloxacin Other (See Comments)    unknown  . Penicillins Hives    Family History:  Family History  Problem Relation Age of Onset  . Diabetes Mellitus II Mother   . Stroke Mother       Prior to Admission medications   Medication Sig Start Date End Date Taking? Authorizing Provider  collagenase (SANTYL) ointment Apply 1 application topically daily. To left hand incision    Historical Provider, MD  guaiFENesin-codeine (ROBITUSSIN AC) 100-10 MG/5ML syrup Take 30 mLs by mouth every 6 (six) hours as needed for cough.    Historical Provider, MD  HYDROcodone-acetaminophen (NORCO/VICODIN) 5-325 MG per tablet Take 1 tablet by mouth every 4 (four) hours as needed for moderate pain.  Historical Provider, MD  HYDROcodone-acetaminophen (NORCO/VICODIN) 5-325 MG per tablet Take 1-2 tablets by mouth every 6 (six) hours as needed for moderate pain. 11/15/13   Cathren Laine, MD  lanthanum (FOSRENOL) 1000 MG chewable tablet Chew 1 tablet (1,000 mg total) by mouth 3 (three) times daily with meals. 03/21/12   Elenora Gamma, MD  loperamide (IMODIUM) 2 MG capsule Take 2 mg by mouth 4 (four) times daily as needed for diarrhea or loose stools.    Historical Provider, MD  loratadine (CLARITIN) 10 MG tablet Take 10 mg by mouth daily.    Historical Provider, MD  metoCLOPramide (REGLAN) 5  MG tablet Take 5 mg by mouth 4 (four) times daily.    Historical Provider, MD  metoprolol tartrate (LOPRESSOR) 25 MG tablet Take 12.5 mg by mouth 2 (two) times daily.    Historical Provider, MD  Multiple Vitamin (MULTIVITAMIN WITH MINERALS) TABS Take 1 tablet by mouth daily.    Historical Provider, MD  mupirocin ointment (BACTROBAN) 2 % Apply 1 application topically 2 (two) times daily. 11/07/13   Historical Provider, MD  pantoprazole (PROTONIX) 40 MG tablet Take 40 mg by mouth every 12 (twelve) hours.     Historical Provider, MD  promethazine (PHENERGAN) 25 MG tablet Take 25 mg by mouth every 6 (six) hours as needed for nausea.     Historical Provider, MD  sertraline (ZOLOFT) 100 MG tablet Take 75 mg by mouth daily.     Historical Provider, MD  sulfamethoxazole-trimethoprim (BACTRIM DS) 800-160 MG per tablet Take 1 tablet by mouth 2 (two) times daily. 11/05/13   Historical Provider, MD    Physical Exam: Filed Vitals:   12/10/14 1730 12/10/14 1745 12/10/14 2000 12/10/14 2030  BP: 132/93 155/56 121/43 139/81  Pulse: 92 93 96 91  Temp:      TempSrc:      Resp:    14  Height:      Weight:      SpO2: 99% 100% 99% 100%     General:  Moderately built and nourished.  Eyes: Anicteric no pallor.  ENT: No discharge from the ears eyes nose and mouth.  Neck: No mass felt.  Cardiovascular: S1-S2 heard.  Respiratory: No rhonchi or crepitations.  Abdomen: Soft nontender bowel sounds present.  Skin: Right foot has at least 3 ulcers on the lateral aspect of foot with one measuring at least 1-1/2 cm in length with slough. No active discharge seen. Erythema of the right foot.  Musculoskeletal: Left BKA. Amputation of the left hand. Does also have dictation of the right foot first 2 toes.  Psychiatric: Appears normal.  Neurologic: Alert awake oriented to time place and person. 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  Labs on Admission:  Basic Metabolic Panel:  Recent Labs Lab  12/10/14 1757  NA 138  K 3.3*  CL 93*  CO2 32  GLUCOSE 134*  BUN 7  CREATININE 2.38*  CALCIUM 9.9   Liver Function Tests: No results for input(s): AST, ALT, ALKPHOS, BILITOT, PROT, ALBUMIN in the last 168 hours. No results for input(s): LIPASE, AMYLASE in the last 168 hours. No results for input(s): AMMONIA in the last 168 hours. CBC:  Recent Labs Lab 12/10/14 1757  WBC 13.2*  HGB 11.8*  HCT 35.2*  MCV 98.9  PLT 199   Cardiac Enzymes: No results for input(s): CKTOTAL, CKMB, CKMBINDEX, TROPONINI in the last 168 hours.  BNP (last 3 results) No results for input(s): BNP in the last 8760 hours.  ProBNP (last 3 results) No results for input(s): PROBNP in the last 8760 hours.  CBG: No results for input(s): GLUCAP in the last 168 hours.  Radiological Exams on Admission: Dg Foot Complete Right  12/10/2014   CLINICAL DATA:  Ulcers on the right foot for 2 weeks. History of prior amputations.  EXAM: RIGHT FOOT COMPLETE - 3+ VIEW  COMPARISON:  Plain films of the right foot 01/22/2012.  FINDINGS: Again seen is postoperative change of resection of the great toe and bulk of the third ray. No bony destructive change or periosteal reaction is identified. There is some gas in the soft tissues along the lateral aspect of the foot at the level of the cuboid. Extensive vascular calcifications are noted.  IMPRESSION: Soft tissue gas in the lateral aspect of the foot at the level of the cuboid. Negative for plain film evidence of osteomyelitis.  Status post resection of the great toe at the bulk of the third ray.  Atherosclerosis.   Electronically Signed   By: Drusilla Kanner M.D.   On: 12/10/2014 19:39     Assessment/Plan Principal Problem:   Cellulitis of right foot Active Problems:   HTN (hypertension)   ESRD (end stage renal disease) on dialysis   Foot ulcer   Diabetes mellitus type 2, controlled   Chronic anemia   1. Cellulitis of the right foot with ulcer - since x-ray shows  gas in the tissues I have discussed with on-call orthopedic surgeon Dr. Roda Shutters who will be seeing patient in consult. At this time patient has been placed on vancomycin and Zosyn. I have ordered MRI of the right foot. Follow orthopedic recommendations. Wound team has been consulted. 2. ESRD on hemodialysis on Monday Wednesday and Friday - patient has had dialysis today. Consult nephrology for dialysis. Patient does not look fluid overloaded. 3. Diabetes mellitus type 2 on diet - follow CBGs and sliding scale coverage and diabetic diet. 4. Hypertension - continue metoprolol. 5. Chronic anemia - follow CBC. 6. History of PEA arrest.  I have reviewed patient's old charts of labs and personally reviewed patient's right foot x-ray. I have discussed with on-call orthopedic surgeon.   DVT Prophylaxis Lovenox.  Code Status: Full code.  Family Communication: Discussed with patient.  Disposition Plan: Admit to inpatient.    Amri Lien N. [facility] Pager [phone].  If 7PM-7AM, please contact night-coverage [email or web address] Password [facility] 12/10/2014, 8:51 PM

## 2014-12-10 NOTE — Progress Notes (Signed)
ANTIBIOTIC CONSULT NOTE - INITIAL  Pharmacy Consult for vancomycin and zosyn Indication: cellulitis  Allergies  Allergen Reactions  . Codeine Hives  . Levofloxacin Other (See Comments)    unknown  . Penicillins Hives    Patient Measurements: Height:  (162.6 cm) Weight: 130 lb (58.968 kg) IBW/kg (Calculated) : 54.7  Vital Signs: Temp: 98.8 F (37.1 C) (08/17 2114) Temp Source: Oral (08/17 2114) BP: 128/68 mmHg (08/17 2114) Pulse Rate: 94 (08/17 2114) Intake/Output from previous day:   Intake/Output from this shift: Total I/O In: 200 [I.V.:200] Out: -   Labs:  Recent Labs  12/10/14 1757  WBC 13.2*  HGB 11.8*  PLT 199  CREATININE 2.38*   Estimated Creatinine Clearance: 23.9 mL/min (by C-G formula based on Cr of 2.38). No results for input(s): VANCOTROUGH, VANCOPEAK, VANCORANDOM, GENTTROUGH, GENTPEAK, GENTRANDOM, TOBRATROUGH, TOBRAPEAK, TOBRARND, AMIKACINPEAK, AMIKACINTROU, AMIKACIN in the last 72 hours.   Microbiology: No results found for this or any previous visit (from the past 720 hour(s)).  Medical History: Past Medical History  Diagnosis Date  . Hypertension   . Anemia   . Diabetes mellitus   . Peripheral vascular disease   . Hemodialysis patient 03/15/11    "Tues; Thurs; Sat; [facility]"  . Blood transfusion   . Chronic back pain   . Rash 03/15/11    "admitted me to find out what its' from; UE, waist, thighs, groin"  . Renal failure     hd 4/12   Assessment: 54 year old female presents to [facility] with diabetic foot ulcer/cellulitis. Vancomycin x1 given in the ED. Patient has CKD, unknown baseline but scr is elevated at 2.38, will need to dose adjust vancomycin.   No fevers, wbc slightly elevated at 13.2, Lactic acid 1.55, glucose elevated at 395.  Goal of Therapy:  Vancomycin trough level 10-15 mcg/ml  Plan:  Measure antibiotic drug levels at steady state Follow up culture results Vancomycin 1g q48 hours -next dose  8/19 Zosyn 3.375g IV q8 hours  Sheppard Coil PharmD., BCPS Clinical Pharmacist Pager [phone] 12/10/2014 10:15 PM

## 2014-12-11 ENCOUNTER — Encounter (HOSPITAL_COMMUNITY): Payer: Self-pay | Admitting: General Practice

## 2014-12-11 DIAGNOSIS — L02619 Cutaneous abscess of unspecified foot: Secondary | ICD-10-CM

## 2014-12-11 DIAGNOSIS — E1129 Type 2 diabetes mellitus with other diabetic kidney complication: Secondary | ICD-10-CM

## 2014-12-11 DIAGNOSIS — L03119 Cellulitis of unspecified part of limb: Secondary | ICD-10-CM

## 2014-12-11 DIAGNOSIS — N058 Unspecified nephritic syndrome with other morphologic changes: Secondary | ICD-10-CM

## 2014-12-11 DIAGNOSIS — L089 Local infection of the skin and subcutaneous tissue, unspecified: Secondary | ICD-10-CM

## 2014-12-11 DIAGNOSIS — N186 End stage renal disease: Secondary | ICD-10-CM

## 2014-12-11 DIAGNOSIS — Z992 Dependence on renal dialysis: Secondary | ICD-10-CM

## 2014-12-11 DIAGNOSIS — E11628 Type 2 diabetes mellitus with other skin complications: Secondary | ICD-10-CM

## 2014-12-11 DIAGNOSIS — E1169 Type 2 diabetes mellitus with other specified complication: Secondary | ICD-10-CM

## 2014-12-11 DIAGNOSIS — I10 Essential (primary) hypertension: Secondary | ICD-10-CM

## 2014-12-11 DIAGNOSIS — L03115 Cellulitis of right lower limb: Secondary | ICD-10-CM

## 2014-12-11 LAB — CBC WITH DIFFERENTIAL/PLATELET
BASOS ABS: 0 10*3/uL (ref 0.0–0.1)
Basophils Relative: 0 % (ref 0–1)
EOS ABS: 0.2 10*3/uL (ref 0.0–0.7)
EOS PCT: 1 % (ref 0–5)
HCT: 33.8 % — ABNORMAL LOW (ref 36.0–46.0)
Hemoglobin: 10.7 g/dL — ABNORMAL LOW (ref 12.0–15.0)
LYMPHS ABS: 1 10*3/uL (ref 0.7–4.0)
LYMPHS PCT: 8 % — AB (ref 12–46)
MCH: 32.3 pg (ref 26.0–34.0)
MCHC: 31.7 g/dL (ref 30.0–36.0)
MCV: 102.1 fL — AB (ref 78.0–100.0)
MONO ABS: 0.9 10*3/uL (ref 0.1–1.0)
Monocytes Relative: 7 % (ref 3–12)
Neutro Abs: 10.8 10*3/uL — ABNORMAL HIGH (ref 1.7–7.7)
Neutrophils Relative %: 84 % — ABNORMAL HIGH (ref 43–77)
PLATELETS: 202 10*3/uL (ref 150–400)
RBC: 3.31 MIL/uL — ABNORMAL LOW (ref 3.87–5.11)
RDW: 14.5 % (ref 11.5–15.5)
WBC: 12.9 10*3/uL — ABNORMAL HIGH (ref 4.0–10.5)

## 2014-12-11 LAB — COMPREHENSIVE METABOLIC PANEL WITH GFR
ALT: 11 U/L — ABNORMAL LOW (ref 14–54)
AST: 27 U/L (ref 15–41)
Albumin: 2.9 g/dL — ABNORMAL LOW (ref 3.5–5.0)
Alkaline Phosphatase: 133 U/L — ABNORMAL HIGH (ref 38–126)
Anion gap: 15 (ref 5–15)
BUN: 16 mg/dL (ref 6–20)
CO2: 30 mmol/L (ref 22–32)
Calcium: 9.7 mg/dL (ref 8.9–10.3)
Chloride: 90 mmol/L — ABNORMAL LOW (ref 101–111)
Creatinine, Ser: 3.43 mg/dL — ABNORMAL HIGH (ref 0.44–1.00)
GFR calc Af Amer: 17 mL/min — ABNORMAL LOW
GFR calc non Af Amer: 14 mL/min — ABNORMAL LOW
Glucose, Bld: 134 mg/dL — ABNORMAL HIGH (ref 65–99)
Potassium: 3.3 mmol/L — ABNORMAL LOW (ref 3.5–5.1)
Sodium: 135 mmol/L (ref 135–145)
Total Bilirubin: 0.7 mg/dL (ref 0.3–1.2)
Total Protein: 8.1 g/dL (ref 6.5–8.1)

## 2014-12-11 LAB — GLUCOSE, CAPILLARY
GLUCOSE-CAPILLARY: 153 mg/dL — AB (ref 65–99)
GLUCOSE-CAPILLARY: 198 mg/dL — AB (ref 65–99)
GLUCOSE-CAPILLARY: 196 mg/dL — AB (ref 65–99)
Glucose-Capillary: 240 mg/dL — ABNORMAL HIGH (ref 65–99)

## 2014-12-11 MED ORDER — NEPRO/CARBSTEADY PO LIQD
237.0000 mL | Freq: Two times a day (BID) | ORAL | Status: DC
  Administered 2014-12-11 – 2014-12-13 (×3): 237 mL via ORAL
  Filled 2014-12-11 (×7): qty 237

## 2014-12-11 MED ORDER — SODIUM CHLORIDE 0.9 % IV SOLN
500.0000 mg | INTRAVENOUS | Status: DC
  Administered 2014-12-12: 500 mg via INTRAVENOUS
  Filled 2014-12-11 (×2): qty 500

## 2014-12-11 MED ORDER — DOXERCALCIFEROL 4 MCG/2ML IV SOLN
4.0000 ug | INTRAVENOUS | Status: DC
  Administered 2014-12-12: 4 ug via INTRAVENOUS
  Filled 2014-12-11: qty 2

## 2014-12-11 MED ORDER — COLLAGENASE 250 UNIT/GM EX OINT
TOPICAL_OINTMENT | Freq: Every day | CUTANEOUS | Status: DC
  Administered 2014-12-11 – 2014-12-13 (×3): via TOPICAL
  Filled 2014-12-11: qty 30

## 2014-12-11 MED ORDER — BUPIVACAINE-EPINEPHRINE (PF) 0.25% -1:200000 IJ SOLN
INTRAMUSCULAR | Status: AC
  Filled 2014-12-11: qty 30

## 2014-12-11 MED ORDER — PNEUMOCOCCAL VAC POLYVALENT 25 MCG/0.5ML IJ INJ
0.5000 mL | INJECTION | INTRAMUSCULAR | Status: AC
  Administered 2014-12-13: 0.5 mL via INTRAMUSCULAR
  Filled 2014-12-11: qty 0.5

## 2014-12-11 MED ORDER — PIPERACILLIN-TAZOBACTAM IN DEX 2-0.25 GM/50ML IV SOLN
2.2500 g | Freq: Three times a day (TID) | INTRAVENOUS | Status: DC
  Administered 2014-12-11 – 2014-12-12 (×3): 2.25 g via INTRAVENOUS
  Filled 2014-12-11 (×5): qty 50

## 2014-12-11 NOTE — Progress Notes (Signed)
ANTIBIOTIC CONSULT NOTE - Follow Up  Pharmacy Consult for vancomycin and zosyn Indication: cellulitis  Allergies  Allergen Reactions  . Codeine Hives  . Levofloxacin Other (See Comments)    unknown  . Penicillins Hives    Patient Measurements: Height:  (162.6 cm) Weight: 130 lb (58.968 kg) IBW/kg (Calculated) : 54.7  Vital Signs: Temp: 97.6 F (36.4 C) (08/18 1300) Temp Source: Oral (08/18 0611) BP: 124/61 mmHg (08/18 1300) Pulse Rate: 88 (08/18 1300) Intake/Output from previous day: 08/17 0701 - 08/18 0700 In: 253 [I.V.:203; IV Piggyback:50] Out: -  Intake/Output from this shift: Total I/O In: 480 [P.O.:480] Out: -   Labs:  Recent Labs  12/10/14 1757 12/11/14 0802  WBC 13.2* 12.9*  HGB 11.8* 10.7*  PLT 199 202  CREATININE 2.38* 3.43*   Estimated Creatinine Clearance: 16.6 mL/min (by C-G formula based on Cr of 3.43). No results for input(s): VANCOTROUGH, VANCOPEAK, VANCORANDOM, GENTTROUGH, GENTPEAK, GENTRANDOM, TOBRATROUGH, TOBRAPEAK, TOBRARND, AMIKACINPEAK, AMIKACINTROU, AMIKACIN in the last 72 hours.   Microbiology: No results found for this or any previous visit (from the past 720 hour(s)).  Medical History: Past Medical History  Diagnosis Date  . Hypertension   . Anemia   . Diabetes mellitus   . Peripheral vascular disease   . Hemodialysis patient 03/15/11    "Tues; Thurs; Sat; [facility]"  . Blood transfusion   . Chronic back pain   . Rash 03/15/11    "admitted me to find out what its' from; UE, waist, thighs, groin"  . Renal failure     hd 4/12  . Stroke    Assessment: 54 year old female presents to [facility] with diabetic foot ulcer/cellulitis. Vancomycin x1 given in the ED on 8/17.  Patient has ESRD with HD on MWF.  Will need to adjust abx accordingly.    No fevers, wbc slightly elevated at 13.2, Lactic acid 1.55, glucose elevated at 395.  Noted, pt reports PCN allergy = hives but has been tolerating Zosyn this  admission.  Goal of Therapy:  Pre-HD Vancomycin level 15-25 mcg/ml  Plan:  Vancomycin  IV after each HD session - next due 8/19 Change Zosyn to 2.25 gm IV q8h. Follow-up cx data, clinical progress, and toleration of HD Will check Vancomycin level at steady state.  [employer], Pharm.D., BCPS Clinical Pharmacist Pager [phone] 12/11/2014 2:00 PM

## 2014-12-11 NOTE — Consult Note (Addendum)
WOC wound consult note Reason for Consult: Consult requested for right foot wounds.  Pt has been assessed by ortho service, refer to previous progress notes.  MRI does not indicate abscess or osteomyelitis. Wound type: Right plantar foot with full thickness wound where previous blistered area has peeled; affected area 10X10cm, loose skin peeling in sheets, revealing white macerated skin with minimal amt yellow drainage.   Anterior foot with cellulitis which has been marked, generalized edema and erythremia, approx 6X6cm, area is beginning to receede from previous line which was drawn. Measurement: Several areas of soft dark brown eschar to right plantar outer foot; .3X.3cm, .5X1.2cm, .3X.3cm, .3X1cm, 4X3cm All areas with minimal amt tan drainage, some odor, fluctuant when probed. Pt denies c/o pain. Removed loose peeling skin as much as possible using scissors, pt tolerated without c/o pain or bleeding.  Some peeling skin remains tightly adhered in patchy areas.   Dressing procedure/placement/frequency: ABI results are pending to assess perfusion, pt plans to be followed at the outpatient wound care center according to the EMR. Santyl ointment to chemically debride nonviable tissue to eschar, and vaseline gauze to promote healing to plantar foot.  Discussed plan of care with patient and husband at bedside and they deny further questions. Please re-consult if further assistance is needed.  Thank-you,  Cammie Mcgee MSN, RN, CWOCN, [place], CNS [phone]

## 2014-12-11 NOTE — Progress Notes (Signed)
Utilization review completed. Mariateresa Batra, RN, BSN. 

## 2014-12-11 NOTE — Consult Note (Signed)
Reason for Consult: Continuity of ESRD care Referring Physician: David Tat M.D. (TRH)  HPI:  54-year-old woman with history of end-stage renal disease on hemodialysis (Monday/Wednesday/Friday at [facility] diabetes mellitus, hypertension and peripheral vascular disease status post amputation of left hand, left below knee amputation and amputation of right 2 medial toes in the past. Referred to the emergency room for further evaluation of what appeared to be a draining right lower extremity wound. Clinical and radiological data at this time point towards cellulitis of the right foot with dry eschars and no evidence of osteomyelitis/abscess.  She denies any preceding fever or chills or obvious pain of the right lower extremity. She denies any nausea, vomiting or diarrhea and does not have any chest pain or shortness of breath. At baseline, she informs me that she is able to ambulate when she wears her left lower extremity prosthesis. Recently started on Bactrim (about 1 week ago) for right lower extremity cellulitis at her skilled nursing facility.  Outpatient dialysis prescription: Mon/Wed/Fri at [facility] 4 hours, 180 dialyzer, blood flow rate 400, dialysate flow rate 600, estimated dry weight 77.5 kg, 2K/2.25 calcium, UF profile 4, linear sodium modeling, Hectorol 4 g 3 days a week, heparin 5100 units bolus, Mircera 50 g every 2 weeks. Left upper arm brachiocephalic fistula   Past Medical History  Diagnosis Date  . Hypertension   . Anemia   . Diabetes mellitus   . Peripheral vascular disease   . Hemodialysis patient 03/15/11    "Tues; Thurs; Sat; [facility]"  . Blood transfusion   . Chronic back pain   . Rash 03/15/11    "admitted me to find out what its' from; UE, waist, thighs, groin"  . Renal failure     hd 4/12  . Stroke     Past Surgical History  Procedure Laterality Date  . Insertion of dialysis catheter  07/29/10    right  subclavian  . Arteriovenous graft placement  10/01/10    left upper arm  . Below knee leg amputation  2004    left  . Amputation  03/04/2011    Procedure: AMPUTATION DIGIT;  Surgeon: Marcus V Duda, MD;  Location: [facility] OR;  Service: Orthopedics;  Laterality: Right;  RT GREAT TOE AMP  . Cataract extraction w/ intraocular lens implant  12/2009    right eye  . Carpal tunnel release  ~ 2009    left wrist  . Tee without cardioversion  03/18/2011    Procedure: TRANSESOPHAGEAL ECHOCARDIOGRAM (TEE);  Surgeon: Dalton McLean, MD;  Location: [facility] ENDOSCOPY;  Service: Cardiovascular;  Laterality: N/A;  . Hand amputation Left     Family History  Problem Relation Age of Onset  . Diabetes Mellitus II Mother   . Stroke Mother     Social History:  reports that she has never smoked. She has never used smokeless tobacco. She reports that she does not drink alcohol or use illicit drugs.  Allergies:  Allergies  Allergen Reactions  . Codeine Hives  . Levofloxacin Other (See Comments)    unknown  . Penicillins Hives    Medications:  Scheduled: . collagenase   Topical Daily  . enoxaparin (LOVENOX) injection  30 mg Subcutaneous Q24H  . insulin aspart  0-9 Units Subcutaneous TID WC  . lanthanum  1,000 mg Oral TID WC  . loratadine  10 mg Oral Daily  . metoCLOPramide  5 mg Oral TID AC & HS  . metoprolol tartrate    12.5 mg Oral BID  . multivitamin with minerals  1 tablet Oral Daily  . pantoprazole  40 mg Oral Q12H  . piperacillin-tazobactam (ZOSYN)  IV  3.375 g Intravenous 3 times per day  . [START ON 12/12/2014] pneumococcal 23 valent vaccine  0.5 mL Intramuscular Tomorrow-1000  . sertraline  75 mg Oral Daily  . sodium chloride  3 mL Intravenous Q12H  . [START ON 12/12/2014] vancomycin  1,000 mg Intravenous Q48H    Results for orders placed or performed during the hospital encounter of 12/10/14 (from the past 48 hour(s))  CBC     Status: Abnormal   Collection Time: 12/10/14  5:57 PM  Result Value Ref  Range   WBC 13.2 (H) 4.0 - 10.5 K/uL   RBC 3.56 (L) 3.87 - 5.11 MIL/uL   Hemoglobin 11.8 (L) 12.0 - 15.0 g/dL   HCT 35.2 (L) 36.0 - 46.0 %   MCV 98.9 78.0 - 100.0 fL   MCH 33.1 26.0 - 34.0 pg   MCHC 33.5 30.0 - 36.0 g/dL   RDW 14.2 11.5 - 15.5 %   Platelets 199 150 - 400 K/uL  Basic metabolic panel     Status: Abnormal   Collection Time: 12/10/14  5:57 PM  Result Value Ref Range   Sodium 138 135 - 145 mmol/L   Potassium 3.3 (L) 3.5 - 5.1 mmol/L   Chloride 93 (L) 101 - 111 mmol/L   CO2 32 22 - 32 mmol/L   Glucose, Bld 134 (H) 65 - 99 mg/dL   BUN 7 6 - 20 mg/dL   Creatinine, Ser 2.38 (H) 0.44 - 1.00 mg/dL   Calcium 9.9 8.9 - 10.3 mg/dL   GFR calc non Af Amer 22 (L) >60 mL/min   GFR calc Af Amer 26 (L) >60 mL/min    Comment: (NOTE) The eGFR has been calculated using the CKD EPI equation. This calculation has not been validated in all clinical situations. eGFR's persistently <60 mL/min signify possible Chronic Kidney Disease.    Anion gap 13 5 - 15  I-Stat CG4 Lactic Acid, ED     Status: None   Collection Time: 12/10/14  7:00 PM  Result Value Ref Range   Lactic Acid, Venous 1.55 0.5 - 2.0 mmol/L  Glucose, capillary     Status: Abnormal   Collection Time: 12/10/14  9:40 PM  Result Value Ref Range   Glucose-Capillary 395 (H) 65 - 99 mg/dL  Glucose, capillary     Status: Abnormal   Collection Time: 12/11/14  6:29 AM  Result Value Ref Range   Glucose-Capillary 153 (H) 65 - 99 mg/dL  Comprehensive metabolic panel     Status: Abnormal   Collection Time: 12/11/14  8:02 AM  Result Value Ref Range   Sodium 135 135 - 145 mmol/L   Potassium 3.3 (L) 3.5 - 5.1 mmol/L   Chloride 90 (L) 101 - 111 mmol/L   CO2 30 22 - 32 mmol/L   Glucose, Bld 134 (H) 65 - 99 mg/dL   BUN 16 6 - 20 mg/dL   Creatinine, Ser 3.43 (H) 0.44 - 1.00 mg/dL    Comment: RESULT REPEATED AND VERIFIED   Calcium 9.7 8.9 - 10.3 mg/dL   Total Protein 8.1 6.5 - 8.1 g/dL   Albumin 2.9 (L) 3.5 - 5.0 g/dL   AST 27 15 -  41 U/L   ALT 11 (L) 14 - 54 U/L   Alkaline Phosphatase 133 (H) 38 - 126 U/L   Total   Bilirubin 0.7 0.3 - 1.2 mg/dL   GFR calc non Af Amer 14 (L) >60 mL/min   GFR calc Af Amer 17 (L) >60 mL/min    Comment: (NOTE) The eGFR has been calculated using the CKD EPI equation. This calculation has not been validated in all clinical situations. eGFR's persistently <60 mL/min signify possible Chronic Kidney Disease.    Anion gap 15 5 - 15  CBC WITH DIFFERENTIAL     Status: Abnormal   Collection Time: 12/11/14  8:02 AM  Result Value Ref Range   WBC 12.9 (H) 4.0 - 10.5 K/uL   RBC 3.31 (L) 3.87 - 5.11 MIL/uL   Hemoglobin 10.7 (L) 12.0 - 15.0 g/dL   HCT 33.8 (L) 36.0 - 46.0 %   MCV 102.1 (H) 78.0 - 100.0 fL   MCH 32.3 26.0 - 34.0 pg   MCHC 31.7 30.0 - 36.0 g/dL   RDW 14.5 11.5 - 15.5 %   Platelets 202 150 - 400 K/uL   Neutrophils Relative % 84 (H) 43 - 77 %   Neutro Abs 10.8 (H) 1.7 - 7.7 K/uL   Lymphocytes Relative 8 (L) 12 - 46 %   Lymphs Abs 1.0 0.7 - 4.0 K/uL   Monocytes Relative 7 3 - 12 %   Monocytes Absolute 0.9 0.1 - 1.0 K/uL   Eosinophils Relative 1 0 - 5 %   Eosinophils Absolute 0.2 0.0 - 0.7 K/uL   Basophils Relative 0 0 - 1 %   Basophils Absolute 0.0 0.0 - 0.1 K/uL  Glucose, capillary     Status: Abnormal   Collection Time: 12/11/14 11:18 AM  Result Value Ref Range   Glucose-Capillary 198 (H) 65 - 99 mg/dL   Comment 1 Notify RN     Mr Foot Right Wo Contrast  12/11/2014   CLINICAL DATA:  Nonhealing draining right foot wound. History of partial right foot amputation, diabetes and hemodialysis.  EXAM: MRI OF THE RIGHT FOREFOOT WITHOUT CONTRAST  TECHNIQUE: Multiplanar, multisequence MR imaging was performed. No intravenous contrast was administered.  COMPARISON:  Radiographs 12/10/2014.  FINDINGS: Corresponding with the radiographic abnormalities are ill-defined soft tissue edema and small foci of low signal lateral to the midfoot corresponding with soft tissue emphysema. No  foreign bodies are identified. There is no drainable fluid collection in this area. The overlying skin appears ulcerated. No other focal inflammatory changes are identified. There is mild T2 hyperintensity throughout the forefoot musculature.  Patient is status post amputation of the great toe as well as amputation through the base of the third metatarsal. 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 There is no evidence of osteomyelitis. The alignment at the Lisfranc joint appears normal.  IMPRESSION: 1. Inflammatory changes and soft tissue emphysema lateral to the midfoot. No drainable soft tissue abscess identified. 2. No evidence of osteomyelitis. 3. Postsurgical changes as described. 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   Electronically Signed   By: Richardean Sale M.D.   On: 12/11/2014 07:52   Dg Foot Complete Right  12/10/2014   CLINICAL DATA:  Ulcers on the right foot for 2 weeks. History of prior amputations.  EXAM: RIGHT FOOT COMPLETE - 3+ VIEW  COMPARISON:  Plain films of the right foot 01/22/2012.  FINDINGS: Again seen is postoperative change of resection of the great toe and bulk of the third ray. No bony destructive change  or periosteal reaction is identified. There is some gas in the soft tissues along the lateral aspect of the foot at the level of the cuboid. Extensive vascular calcifications are noted.  IMPRESSION: Soft tissue gas in the lateral aspect of the foot at the level of the cuboid. Negative for plain film evidence of osteomyelitis.  Status post resection of the great toe at the bulk of the third ray.  Atherosclerosis.   Electronically Signed   By: Thomas  Dalessio M.D.   On: 12/10/2014 19:39    Review of Systems  Constitutional: Negative.   HENT: Negative.   Eyes: Negative.   Respiratory: Negative.    Cardiovascular: Negative.   Gastrointestinal: Negative.   Genitourinary: Negative.   Musculoskeletal:       See HPI above  Skin: Negative.   Neurological:       No focal deficits noted   Blood pressure 124/61, pulse 88, temperature 97.6 F (36.4 C), temperature source Oral, resp. rate 16, height 5' 4" (1.626 m), weight 58.968 kg (130 lb), SpO2 92 %. Physical Exam  Nursing note and vitals reviewed. Constitutional: She is oriented to person, place, and time. She appears well-developed and well-nourished. No distress.  HENT:  Head: Normocephalic and atraumatic.  Nose: Nose normal.  Mouth/Throat: No oropharyngeal exudate.  Eyes: Conjunctivae and EOM are normal. Pupils are equal, round, and reactive to light. No scleral icterus.  Neck: Normal range of motion. Neck supple. No JVD present.  Cardiovascular: Normal rate, regular rhythm and normal heart sounds.  Exam reveals no friction rub.   No murmur heard. Respiratory: Effort normal and breath sounds normal. She has no wheezes. She has no rales.  GI: Soft. Bowel sounds are normal. There is no tenderness. There is no rebound.  Musculoskeletal: She exhibits edema.  Cellulitis of the right foot with multiple dry eschars-no visible drainage. Edema associated with cellulitis noted with erythema/tenderness. Status post left BKA, status post left hand amputation and status post right foot hallux/second toe amputation. Left upper arm AV fistula-poor thrill  Neurological: She is alert and oriented to person, place, and time.  Skin: Skin is warm and dry. There is erythema.  Focal erythema over right foot    Assessment/Plan: 1. Right leg/foot cellulitis: MRI without evidence of osteomyelitis or focal abscess. On empiric antibiotic coverage with intravenous vancomycin and Zosyn. Wound care as recommended by orthopedic surgery. 2. End-stage renal disease on hemodialysis: Continue hemodialysis on her scheduled Monday/Wednesday/Friday treatments. She  does not have any acute dialysis needs at this time. 3. Anemia of chronic kidney disease: Hemoglobin acceptable, anticipate ESA resistance from ongoing infection and will re-dose Aranesp. 4. Metabolic bone disease: Resume Hectorol with hemodialysis and restart phosphorus binder (Fosrenol) 5. Hypertension: Blood pressure is currently well controlled on low-dose beta blocker, monitor with ultrafiltration at hemodialysis. 6. Malnutrition: This is likely on account of her chronic wounds/infections with malnutrition-inflammation complex. Continue renal diet and supplementation with protein.  , K. 12/11/2014, 1:43 PM      

## 2014-12-11 NOTE — Progress Notes (Addendum)
ED CM assisted with obtaining pt a wound care center appt Information entered in d/c f/u section after informing Dr Tat, attending at [phone]  You have a scheduled appt on 12/25/14 at 9:15 am at the wound care center Please attend or call to reschedule as needed Be Aware this is the first available appt as of 12/11/14  Cm received assist from [facility] operator to reach pt in her room to inform her of her appt and she voiced agreement with the appt day & time and appreciation of services rendered

## 2014-12-11 NOTE — Consult Note (Signed)
ORTHOPAEDIC CONSULTATION  REQUESTING PHYSICIAN: Catarina Hartshorn, MD  Chief Complaint: Right foot cellulitis  HPI: [name] is a 54 y.o. female who presents with right foot cellulitis and eschars and calluses for several weeks.  Denies any injuries.  Nonambulatory since CVA years ago.  Denies any pain.  Was sent here by patient's MD.  Denies f/c/n/v.  Past Medical History  Diagnosis Date  . Hypertension   . Anemia   . Diabetes mellitus   . Peripheral vascular disease   . Hemodialysis patient 03/15/11    "Tues; Thurs; Sat; [facility]"  . Blood transfusion   . Chronic back pain   . Rash 03/15/11    "admitted me to find out what its' from; UE, waist, thighs, groin"  . Renal failure     hd 4/12   Past Surgical History  Procedure Laterality Date  . Insertion of dialysis catheter  07/29/10    right subclavian  . Arteriovenous graft placement  10/01/10    left upper arm  . Below knee leg amputation  2004    left  . Amputation  03/04/2011    Procedure: AMPUTATION DIGIT;  Surgeon: [name], MD;  Location: [facility] OR;  Service: Orthopedics;  Laterality: Right;  RT GREAT TOE AMP  . Cataract extraction w/ intraocular lens implant  12/2009    right eye  . Carpal tunnel release  ~ 2009    left wrist  . Tee without cardioversion  03/18/2011    Procedure: TRANSESOPHAGEAL ECHOCARDIOGRAM (TEE);  Surgeon: [name], MD;  Location: [facility] ENDOSCOPY;  Service: Cardiovascular;  Laterality: N/A;   Social History   Social History  . Marital Status: Married    Spouse Name: N/A  . Number of Children: N/A  . Years of Education: N/A   Social History Main Topics  . Smoking status: Never Smoker   . Smokeless tobacco: Never Used  . Alcohol Use: No  . Drug Use: No  . Sexual Activity: No   Other Topics Concern  . None   Social History Narrative   Family History  Problem Relation Age of Onset  . Diabetes Mellitus II Mother   . Stroke Mother    Allergies  Allergen  Reactions  . Codeine Hives  . Levofloxacin Other (See Comments)    unknown  . Penicillins Hives   Prior to Admission medications   Medication Sig Start Date End Date Taking? Authorizing Provider  collagenase (SANTYL) ointment Apply 1 application topically daily. To left hand incision    Historical Provider, MD  guaiFENesin-codeine (ROBITUSSIN AC) 100-10 MG/5ML syrup Take 30 mLs by mouth every 6 (six) hours as needed for cough.    Historical Provider, MD  HYDROcodone-acetaminophen (NORCO/VICODIN) 5-325 MG per tablet Take 1 tablet by mouth every 4 (four) hours as needed for moderate pain.     Historical Provider, MD  HYDROcodone-acetaminophen (NORCO/VICODIN) 5-325 MG per tablet Take 1-2 tablets by mouth every 6 (six) hours as needed for moderate pain. 11/15/13   Cathren Laine, MD  lanthanum (FOSRENOL) 1000 MG chewable tablet Chew 1 tablet (1,000 mg total) by mouth 3 (three) times daily with meals. 03/21/12   Elenora Gamma, MD  loperamide (IMODIUM) 2 MG capsule Take 2 mg by mouth 4 (four) times daily as needed for diarrhea or loose stools.    Historical Provider, MD  loratadine (CLARITIN) 10 MG tablet Take 10 mg by mouth daily.    Historical Provider, MD  metoCLOPramide (REGLAN) 5 MG  tablet Take 5 mg by mouth 4 (four) times daily.    Historical Provider, MD  metoprolol tartrate (LOPRESSOR) 25 MG tablet Take 12.5 mg by mouth 2 (two) times daily.    Historical Provider, MD  Multiple Vitamin (MULTIVITAMIN WITH MINERALS) TABS Take 1 tablet by mouth daily.    Historical Provider, MD  mupirocin ointment (BACTROBAN) 2 % Apply 1 application topically 2 (two) times daily. 11/07/13   Historical Provider, MD  pantoprazole (PROTONIX) 40 MG tablet Take 40 mg by mouth every 12 (twelve) hours.     Historical Provider, MD  promethazine (PHENERGAN) 25 MG tablet Take 25 mg by mouth every 6 (six) hours as needed for nausea.     Historical Provider, MD  sertraline (ZOLOFT) 100 MG tablet Take 75 mg by mouth daily.      Historical Provider, MD  sulfamethoxazole-trimethoprim (BACTRIM DS) 800-160 MG per tablet Take 1 tablet by mouth 2 (two) times daily. 11/05/13   Historical Provider, MD   Dg Foot Complete Right  12/10/2014   CLINICAL DATA:  Ulcers on the right foot for 2 weeks. History of prior amputations.  EXAM: RIGHT FOOT COMPLETE - 3+ VIEW  COMPARISON:  Plain films of the right foot 01/22/2012.  FINDINGS: Again seen is postoperative change of resection of the great toe and bulk of the third ray. No bony destructive change or periosteal reaction is identified. There is some gas in the soft tissues along the lateral aspect of the foot at the level of the cuboid. Extensive vascular calcifications are noted.  IMPRESSION: Soft tissue gas in the lateral aspect of the foot at the level of the cuboid. Negative for plain film evidence of osteomyelitis.  Status post resection of the great toe at the bulk of the third ray.  Atherosclerosis.   Electronically Signed   By: Drusilla Kanner M.D.   On: 12/10/2014 19:39    Positive ROS: All other systems have been reviewed and were otherwise negative with the exception of those mentioned in the HPI and as above.  Physical Exam: General: Alert, no acute distress Cardiovascular: No pedal edema Respiratory: No cyanosis, no use of accessory musculature GI: No organomegaly, abdomen is soft and non-tender Skin: No lesions in the area of chief complaint Neurologic: Sensation decreased from peripheral neuropathy Psychiatric: Patient is competent for consent with normal mood and affect Lymphatic: No axillary or cervical lymphadenopathy  MUSCULOSKELETAL:  - cellulitis of right foot - multiple dry eschars of the foot - no drainage - no fluctuance, induration - pulses nonpalpable - foot is wwp  Assessment: Right foot cellulitis, eschars  Plan: - continue empiric abx - local wound care to the eschars - will await MRI read - may eat for now  Thank you for the consult and the  opportunity to see [name]  N. Glee Arvin, MD [facility] [phone] 7:27 AM

## 2014-12-11 NOTE — Progress Notes (Signed)
         PROGRESS NOTE  [name] MRN:[id number] DOB: 02/05/1961 DOA: 12/10/2014 PCP: COLADONATO,JOSEPH A, MD  Brief history 54-year-old female with a history of ESRD, peripheral vascular disease, hypertension, diabetes mellitus, and anemia of CKD presented with one-week history of increasing erythema and pain about her right foot. The patient has had presentations to the left hand, left lower extremity. She states that over the last 2-3 days she was given Bactrim DS, 1 tablet twice a day by [facility] for the erythema on her foot. However because of increasing erythema and pain the patient presented to emergency Department. She was noted to have WBC 13.2 but remained hemodynamically stable. X-rays revealed soft tissue gas. Orthopedics was consulted. The patient was started on vancomycin and Zosyn. Assessment/Plan: Diabetic foot infection/Cellulitis of foot -12/10/2014 MRI foot--inflammatory changes of the soft tissue with emphysema of the lateral to the midfoot. No acute osteomyelitis -Continue antibiotics pending culture data -Appreciated wound care consult -ortho following appreciated -ESR -CRP -check ABIs ESRD -Consult nephrology for maintenance dialysis -Continue fosrenol Diabetes mellitus type 2--diet controlled -Hemoglobin A1c -NovoLog sliding scale Hypertension -Continue metoprolol tartrate Anemia of CKD -Baseline hemoglobin 11-12 History of PEA Arrest    Family Communication:   husband updatedat beside Disposition Plan:   Home when medically stable      Procedures/Studies: Mr Foot Right Wo Contrast  12/11/2014   CLINICAL DATA:  Nonhealing draining right foot wound. History of partial right foot amputation, diabetes and hemodialysis.  EXAM: MRI OF THE RIGHT FOREFOOT WITHOUT CONTRAST  TECHNIQUE: Multiplanar, multisequence MR imaging was performed. No intravenous contrast was administered.  COMPARISON:  Radiographs 12/10/2014.  FINDINGS: Corresponding  with the radiographic abnormalities are ill-defined soft tissue edema and small foci of low signal lateral to the midfoot corresponding with soft tissue emphysema. No foreign bodies are identified. There is no drainable fluid collection in this area. The overlying skin appears ulcerated. No other focal inflammatory changes are identified. There is mild T2 hyperintensity throughout the forefoot musculature.  Patient is status post amputation of the great toe as well as amputation through the base of the third metatarsal. 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 There is no evidence of osteomyelitis. The alignment at the Lisfranc joint appears normal.  IMPRESSION: 1. Inflammatory changes and soft tissue emphysema lateral to the midfoot. No drainable soft tissue abscess identified. 2. No evidence of osteomyelitis. 3. Postsurgical changes as described. 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   Electronically Signed   By: William  Veazey M.D.   On: 12/11/2014 07:52   Dg Foot Complete Right  12/10/2014   CLINICAL DATA:  Ulcers on the right foot for 2 weeks. History of prior amputations.  EXAM: RIGHT FOOT COMPLETE - 3+ VIEW  COMPARISON:  Plain films of the right foot 01/22/2012.  FINDINGS: Again seen is postoperative change of resection of the great toe and bulk of the third ray. No bony destructive change or periosteal reaction is identified. There is some gas in the soft tissues along the lateral aspect of the foot at the level of the cuboid. Extensive vascular calcifications are noted.  IMPRESSION: Soft tissue gas in the lateral aspect of the foot at the level of the cuboid. Negative for plain film evidence of osteomyelitis.  Status post resection of the great toe at the bulk of the third   ray.  Atherosclerosis.   Electronically Signed   By: Thomas   Dalessio M.D.   On: 12/10/2014 19:39         Subjective: Patient states the pain in the right foot is improving. Denies any fevers, chills, chest pain, shortness breath, nausea, vomiting, diarrhea, abdominal pain, hematochezia, melena. No diarrhea. No abdominal pain.  Objective: Filed Vitals:   12/10/14 2000 12/10/14 2030 12/10/14 2114 12/11/14 0611  BP: 121/43 139/81 128/68 128/48  Pulse: 96 91 94 87  Temp:   98.8 F (37.1 C) 98.1 F (36.7 C)  TempSrc:   Oral Oral  Resp:  14 17 16  Height:      Weight:      SpO2: 99% 100% 95% 96%    Intake/Output Summary (Last 24 hours) at 12/11/14 1226 Last data filed at 12/11/14 0900  Gross per 24 hour  Intake    493 ml  Output      0 ml  Net    493 ml   Weight change:  Exam:   General:  Pt is alert, follows commands appropriately, not in acute distress  HEENT: No icterus, No thrush, No neck mass, [place]/AT  Cardiovascular: RRR, S1/S2, no rubs, no gallops  Respiratory: CTA bilaterally, no wheezing, no crackles, no rhonchi  Abdomen: Soft/+BS, non tender, non distended, no guarding; no hepatosplenomegaly Extremities: Eschar lesions on the lateral aspect of the right foot with mild erythema. There is no purulent drainage. No crepitance.; Dorsalis pedis pulse not palpable on the right. 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 No other rashes. Data Reviewed: Basic Metabolic Panel:  Recent Labs Lab 12/10/14 1757 12/11/14 0802  NA 138 135  K 3.3* 3.3*  CL 93* 90*  CO2 32 30  GLUCOSE 134* 134*  BUN 7 16  CREATININE 2.38* 3.43*  CALCIUM 9.9 9.7   Liver Function Tests:  Recent Labs Lab 12/11/14 0802  AST 27  ALT 11*  ALKPHOS 133*  BILITOT 0.7  PROT 8.1  ALBUMIN 2.9*   No results for input(s): LIPASE, AMYLASE in the last 168 hours. No results for input(s): AMMONIA in the last 168 hours. CBC:  Recent Labs Lab 12/10/14 1757 12/11/14 0802  WBC 13.2* 12.9*  NEUTROABS  --  10.8*  HGB 11.8* 10.7*  HCT 35.2* 33.8*   MCV 98.9 102.1*  PLT 199 202   Cardiac Enzymes: No results for input(s): CKTOTAL, CKMB, CKMBINDEX, TROPONINI in the last 168 hours. BNP: Invalid input(s): POCBNP CBG:  Recent Labs Lab 12/10/14 2140 12/11/14 0629 12/11/14 1118  GLUCAP 395* 153* 198*    No results found for this or any previous visit (from the past 240 hour(s)).   Scheduled Meds: . collagenase   Topical Daily  . enoxaparin (LOVENOX) injection  30 mg Subcutaneous Q24H  . insulin aspart  0-9 Units Subcutaneous TID WC  . lanthanum  1,000 mg Oral TID WC  . loratadine  10 mg Oral Daily  . metoCLOPramide  5 mg Oral TID AC & HS  . metoprolol tartrate  12.5 mg Oral BID  . multivitamin with minerals  1 tablet Oral Daily  . pantoprazole  40 mg Oral Q12H  . piperacillin-tazobactam (ZOSYN)  IV  3.375 g Intravenous 3 times per day  . [START ON 12/12/2014] pneumococcal 23 valent vaccine  0.5 mL Intramuscular Tomorrow-1000  . sertraline  75 mg Oral Daily  . sodium chloride  3 mL Intravenous Q12H  . [START ON 12/12/2014] vancomycin  1,000 mg Intravenous Q48H     Continuous Infusions:    , , DO  [facility] Pager [phone]  If 7PM-7AM, please contact night-coverage [email or web address] Password [email or web address] 12/11/2014, 12:26 PM   LOS: 1 day   

## 2014-12-12 DIAGNOSIS — E1169 Type 2 diabetes mellitus with other specified complication: Secondary | ICD-10-CM | POA: Insufficient documentation

## 2014-12-12 DIAGNOSIS — L089 Local infection of the skin and subcutaneous tissue, unspecified: Secondary | ICD-10-CM | POA: Insufficient documentation

## 2014-12-12 DIAGNOSIS — Z992 Dependence on renal dialysis: Secondary | ICD-10-CM | POA: Insufficient documentation

## 2014-12-12 DIAGNOSIS — IMO0001 Reserved for inherently not codable concepts without codable children: Secondary | ICD-10-CM | POA: Insufficient documentation

## 2014-12-12 DIAGNOSIS — N186 End stage renal disease: Secondary | ICD-10-CM | POA: Insufficient documentation

## 2014-12-12 DIAGNOSIS — L02619 Cutaneous abscess of unspecified foot: Secondary | ICD-10-CM | POA: Insufficient documentation

## 2014-12-12 DIAGNOSIS — E1129 Type 2 diabetes mellitus with other diabetic kidney complication: Secondary | ICD-10-CM

## 2014-12-12 DIAGNOSIS — I10 Essential (primary) hypertension: Secondary | ICD-10-CM | POA: Insufficient documentation

## 2014-12-12 DIAGNOSIS — L03119 Cellulitis of unspecified part of limb: Secondary | ICD-10-CM

## 2014-12-12 LAB — BASIC METABOLIC PANEL
Anion gap: 14 (ref 5–15)
BUN: 23 mg/dL — ABNORMAL HIGH (ref 6–20)
CALCIUM: 9.3 mg/dL (ref 8.9–10.3)
CHLORIDE: 90 mmol/L — AB (ref 101–111)
CO2: 30 mmol/L (ref 22–32)
CREATININE: 4.96 mg/dL — AB (ref 0.44–1.00)
GFR calc Af Amer: 11 mL/min — ABNORMAL LOW (ref >60)
GFR calc non Af Amer: 9 mL/min — ABNORMAL LOW (ref >60)
GLUCOSE: 242 mg/dL — AB (ref 65–99)
Potassium: 3.5 mmol/L (ref 3.5–5.1)
Sodium: 134 mmol/L — ABNORMAL LOW (ref 135–145)

## 2014-12-12 LAB — CBC
HCT: 30.5 % — ABNORMAL LOW (ref 36.0–46.0)
Hemoglobin: 9.8 g/dL — ABNORMAL LOW (ref 12.0–15.0)
MCH: 31.9 pg (ref 26.0–34.0)
MCHC: 32.1 g/dL (ref 30.0–36.0)
MCV: 99.3 fL (ref 78.0–100.0)
PLATELETS: 205 10*3/uL (ref 150–400)
RBC: 3.07 MIL/uL — ABNORMAL LOW (ref 3.87–5.11)
RDW: 14.3 % (ref 11.5–15.5)
WBC: 12.8 10*3/uL — ABNORMAL HIGH (ref 4.0–10.5)

## 2014-12-12 LAB — GLUCOSE, CAPILLARY
GLUCOSE-CAPILLARY: 234 mg/dL — AB (ref 65–99)
GLUCOSE-CAPILLARY: 223 mg/dL — AB (ref 65–99)
Glucose-Capillary: 124 mg/dL — ABNORMAL HIGH (ref 65–99)

## 2014-12-12 LAB — C-REACTIVE PROTEIN: CRP: 17.9 mg/dL — ABNORMAL HIGH (ref <1.0)

## 2014-12-12 LAB — SEDIMENTATION RATE: SED RATE: 130 mm/h — AB (ref 0–22)

## 2014-12-12 MED ORDER — PENTAFLUOROPROP-TETRAFLUOROETH EX AERO: 1.0000 "application " | INHALATION_SPRAY | CUTANEOUS | Status: DC | PRN

## 2014-12-12 MED ORDER — HEPARIN SODIUM (PORCINE) 1000 UNIT/ML DIALYSIS: 1000.0000 [IU] | INTRAMUSCULAR | Status: DC | PRN

## 2014-12-12 MED ORDER — DEXTROSE 5 % IV SOLN
2.0000 g | Freq: Once | INTRAVENOUS | Status: AC
  Administered 2014-12-12: 2 g via INTRAVENOUS
  Filled 2014-12-12: qty 2

## 2014-12-12 MED ORDER — LIDOCAINE HCL (PF) 1 % IJ SOLN: 5.0000 mL | INTRAMUSCULAR | Status: DC | PRN

## 2014-12-12 MED ORDER — ALTEPLASE 2 MG IJ SOLR
2.0000 mg | Freq: Once | INTRAMUSCULAR | Status: DC | PRN
  Filled 2014-12-12: qty 2

## 2014-12-12 MED ORDER — HYDROCODONE-ACETAMINOPHEN 5-325 MG PO TABS: 1.0000 | ORAL_TABLET | Freq: Four times a day (QID) | ORAL | Status: DC | PRN

## 2014-12-12 MED ORDER — NEPRO/CARBSTEADY PO LIQD
237.0000 mL | ORAL | Status: DC | PRN
  Filled 2014-12-12: qty 237

## 2014-12-12 MED ORDER — DARBEPOETIN ALFA 60 MCG/0.3ML IJ SOSY
PREFILLED_SYRINGE | INTRAMUSCULAR | Status: AC
  Filled 2014-12-12: qty 0.3

## 2014-12-12 MED ORDER — SODIUM CHLORIDE 0.9 % IV SOLN: 100.0000 mL | INTRAVENOUS | Status: DC | PRN

## 2014-12-12 MED ORDER — DARBEPOETIN ALFA 60 MCG/0.3ML IJ SOSY
60.0000 ug | PREFILLED_SYRINGE | INTRAMUSCULAR | Status: DC
Start: 2014-12-12 — End: 2014-12-13
  Administered 2014-12-12: 60 ug via INTRAVENOUS

## 2014-12-12 MED ORDER — COLLAGENASE 250 UNIT/GM EX OINT: TOPICAL_OINTMENT | Freq: Every day | CUTANEOUS | Status: DC

## 2014-12-12 MED ORDER — LIDOCAINE-PRILOCAINE 2.5-2.5 % EX CREA
1.0000 "application " | TOPICAL_CREAM | CUTANEOUS | Status: DC | PRN
  Filled 2014-12-12: qty 5

## 2014-12-12 MED ORDER — VANCOMYCIN HCL 500 MG IV SOLR: 500.0000 mg | INTRAVENOUS | Status: DC

## 2014-12-12 MED ORDER — NEPRO/CARBSTEADY PO LIQD: 237.0000 mL | Freq: Two times a day (BID) | ORAL | Status: DC

## 2014-12-12 MED ORDER — DEXTROSE 5 % IV SOLN: 2.0000 g | INTRAVENOUS | Status: DC

## 2014-12-12 MED ORDER — DOXERCALCIFEROL 4 MCG/2ML IV SOLN
INTRAVENOUS | Status: AC
  Filled 2014-12-12: qty 2

## 2014-12-12 MED ORDER — HEPARIN SODIUM (PORCINE) 1000 UNIT/ML DIALYSIS
5000.0000 [IU] | INTRAMUSCULAR | Status: DC | PRN
  Filled 2014-12-12: qty 5

## 2014-12-12 NOTE — Clinical Social Work Note (Signed)
Disposition: [facility] (return) Projected discharge: weekend Patient projected to discharge with IV abx.  Husband and SNF have both been updated.  FL2 is on the chart.  Patient is long-term care (payable under Medicaid) so no [employer] (not [employer]) authorization needed.  Vickii Penna, LCSW [phone]  Psychiatric & Orthopedics (5N 1-8) Clinical Social Worker

## 2014-12-12 NOTE — Clinical Social Work Note (Signed)
Clinical Social Work Assessment  Patient Details  Name: [name] MRN: [id number] Date of Birth: 1960-09-15  Date of referral:  12/12/14               Reason for consult:  Facility Placement                Permission sought to share information with:  Oceanographer granted to share information::  Yes, Verbal Permission Granted  Name::        Agency::   [employer])  Relationship::     Contact Information:     Housing/Transportation Living arrangements for the past 2 months:  Skilled Holiday representative, Single Family Home ([facility]) Source of Information:  Patient Patient Interpreter Needed:  None Criminal Activity/Legal Involvement Pertinent to Current Situation/Hospitalization:  No - Comment as needed Significant Relationships:  Spouse Lives with:  Spouse Do you feel safe going back to the place where you live?  No (medical complexity) Need for family participation in patient care:     Care giving concerns:  none   Social Worker assessment / plan:  CSW spoke with husband (patient is currently in hemodialysis).  Husband reports patient being at [facility]) for approximately 3 years this September.  Husband is agreeable for patient to return upon being medically discharged.  Husband states that patient will need hospital transportation.  [facility] has been updated and agreeable for patient to return.  Employment status:  Disabled (Comment on whether or not currently receiving Disability) Insurance information:    PT Recommendations:  Skilled Nursing Facility Information / Referral to community resources:  Skilled Nursing Facility  Patient/Family's Response to care:  Agreeable to return to [facility]  Patient/Family's Understanding of and Emotional Response to Diagnosis, Current Treatment, and Prognosis:  Realistic regarding tx needed at time of discharge  Emotional Assessment Appearance:  Appears stated  age Attitude/Demeanor/Rapport:   (appropriate) Affect (typically observed):  Accepting, Adaptable, Appropriate Orientation:  Oriented to Self, Oriented to Place, Oriented to  Time, Oriented to Situation Alcohol / Substance use:  Not Applicable Psych involvement (Current and /or in the community):  No (Comment)  Discharge Needs  Concerns to be addressed:  No discharge needs identified Readmission within the last 30 days:    Current discharge risk:  None Barriers to Discharge:  No Barriers Identified   Rondel Baton, LCSW 12/12/2014, 12:08 PM

## 2014-12-12 NOTE — Progress Notes (Signed)
PROGRESS NOTE  MYSTERY SCHRUPP TSV:[id number] DOB: 11/04/1960 DOA: 12/10/2014 PCP: Donetta Potts, MD  Brief history 53 year old female with a history of ESRD, peripheral vascular disease, hypertension, diabetes mellitus, and anemia of CKD presented with one-week history of increasing erythema and pain about her right foot. The patient has had presentations to the left hand, left lower extremity. She states that over the last 2-3 days she was given Bactrim DS, 1 tablet twice a day by [facility] for the erythema on her foot. However because of increasing erythema and pain the patient presented to emergency Department. She was noted to have WBC 13.2 but remained hemodynamically stable. X-rays revealed soft tissue gas. Orthopedics was consulted. The patient was started on vancomycin and Zosyn. Assessment/Plan: Diabetic foot infection/Cellulitis of foot -12/10/2014 MRI foot--inflammatory changes of the soft tissue with emphysema of the lateral to the midfoot. No acute osteomyelitis -Continue antibiotics pending culture data -Appreciated wound care consult--apply Santyl to eschar daily then covered with moist dressing--change dressing daily; apply Vaseline gauze to right foot plantar wound daily; controlled areas with Kerlix. -ortho following appreciated -ESR--130 -CRP--17.9 -check ABIs--unable to perform due to noncompressible vessels -Although the MRI was negative for acute osteomyelitis, will treat clinically as osteomyelitis due to palpable bone, wound appearance on exam, and significant elevation of ESR -plan to send to SNF with Vancomycin 542m and ceftazidime 2 grams on HD (MWF) for total of 4 more weeks with last dose on 01/09/15 ESRD -Consult nephrology for maintenance dialysis -Continue fosrenol Diabetes mellitus type 2--diet controlled -Hemoglobin A1c -NovoLog sliding scale -elevated CBGs due to infection Hypertension -Continue metoprolol tartrate Anemia of  CKD -Baseline hemoglobin 11-12 History of PEA Arrest    Family Communication: husband updated at beside Disposition Plan: Home when medically stable    Procedures/Studies: Mr Foot Right Wo Contrast  12/11/2014   CLINICAL DATA:  Nonhealing draining right foot wound. History of partial right foot amputation, diabetes and hemodialysis.  EXAM: MRI OF THE RIGHT FOREFOOT WITHOUT CONTRAST  TECHNIQUE: Multiplanar, multisequence MR imaging was performed. No intravenous contrast was administered.  COMPARISON:  Radiographs 12/10/2014.  FINDINGS: Corresponding with the radiographic abnormalities are ill-defined soft tissue edema and small foci of low signal lateral to the midfoot corresponding with soft tissue emphysema. No foreign bodies are identified. There is no drainable fluid collection in this area. The overlying skin appears ulcerated. No other focal inflammatory changes are identified. There is mild T2 hyperintensity throughout the forefoot musculature.  Patient is status post amputation of the great toe as well as amputation through the base of the third metatarsal. 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 There is no evidence of osteomyelitis. The alignment at the Lisfranc joint appears normal.  IMPRESSION: 1. Inflammatory changes and soft tissue emphysema lateral to the midfoot. No drainable soft tissue abscess identified. 2. No evidence of osteomyelitis. 3. Postsurgical changes as described. 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   Electronically Signed   By: WRichardean SaleM.D.   On: 12/11/2014 07:52   Dg Foot Complete Right  12/10/2014   CLINICAL DATA:  Ulcers on the right foot for 2 weeks. History of prior amputations.  EXAM: RIGHT FOOT COMPLETE - 3+ VIEW  COMPARISON:  Plain films of the right foot 01/22/2012.  FINDINGS:  Again seen is postoperative change of resection of  the great toe and bulk of the third ray. No bony destructive change or periosteal reaction is identified. There is some gas in the soft tissues along the lateral aspect of the foot at the level of the cuboid. Extensive vascular calcifications are noted.  IMPRESSION: Soft tissue gas in the lateral aspect of the foot at the level of the cuboid. Negative for plain film evidence of osteomyelitis.  Status post resection of the great toe at the bulk of the third ray.  Atherosclerosis.   Electronically Signed   By: Inge Rise M.D.   On: 12/10/2014 19:39         Subjective:  Patient states the pain in the foot is much improved.  Patient denies fevers, chills, headache, chest pain, dyspnea, nausea, vomiting, diarrhea, abdominal pain, dysuria, hematuria   Objective: Filed Vitals:   12/12/14 1130 12/12/14 1200 12/12/14 1230 12/12/14 1300  BP: 124/53 115/59 112/93 121/37  Pulse: 73 73 72 76  Temp:      TempSrc:      Resp: _0 Height:      Weight:      SpO2:        Intake/Output Summary (Last 24 hours) at 12/12/14 1752 Last data filed at 12/12/14 0618  Gross per 24 hour  Intake    100 ml  Output      0 ml  Net    100 ml   Weight change: 21.532 kg (47 lb 7.5 oz) Exam:   General:  Pt is alert, follows commands appropriately, not in acute distress  HEENT: No icterus, No thrush, No neck mass, [place]/AT  Cardiovascular: RRR, S1/S2, no rubs, no gallops  Respiratory: CTA bilaterally, no wheezing, no crackles, no rhonchi  Abdomen: Soft/+BS, non tender, non distended, no guarding Extremities: Right lateral foot with eschar lesion without any purulent drainage. Mild odor. No crepitance. Erythema extending from the forefoot to the plantar surface without any crepitance Data Reviewed: Basic Metabolic Panel:  Recent Labs Lab 12/10/14 1757 12/11/14 0802 12/12/14 0432  NA 138 135 134*  K 3.3* 3.3* 3.5  CL 93* 90* 90*  CO2 32  30 30  GLUCOSE 134* 134* 242*  BUN 7 16 23*  CREATININE 2.38* 3.43* 4.96*  CALCIUM 9.9 9.7 9.3   Liver Function Tests:  Recent Labs Lab 12/11/14 0802  AST 27  ALT 11*  ALKPHOS 133*  BILITOT 0.7  PROT 8.1  ALBUMIN 2.9*   No results for input(s): LIPASE, AMYLASE in the last 168 hours. No results for input(s): AMMONIA in the last 168 hours. CBC:  Recent Labs Lab 12/10/14 1757 12/11/14 0802 12/12/14 0432  WBC 13.2* 12.9* 12.8*  NEUTROABS  --  10.8*  --   HGB 11.8* 10.7* 9.8*  HCT 35.2* 33.8* 30.5*  MCV 98.9 102.1* 99.3  PLT 199 202 205   Cardiac Enzymes: No results for input(s): CKTOTAL, CKMB, CKMBINDEX, TROPONINI in the last 168 hours. BNP: Invalid input(s): POCBNP CBG:  Recent Labs Lab 12/11/14 1118 12/11/14 1603 12/11/14 2124 12/12/14 0626 12/12/14 1638  GLUCAP 198* 196* 240* 234* 124*    Recent Results (from the past 240 hour(s))  Blood culture (routine x 2)     Status: None (Preliminary result)   Collection Time: 12/10/14  6:40 PM  Result Value Ref Range Status   Specimen Description BLOOD RIGHT ARM  Final   Special Requests BOTTLES DRAWN AEROBIC AND ANAEROBIC 5ML  Final   Culture NO GROWTH 2 DAYS  Final  Report Status PENDING  Incomplete  Blood culture (routine x 2)     Status: None (Preliminary result)   Collection Time: 12/10/14  6:50 PM  Result Value Ref Range Status   Specimen Description BLOOD RIGHT HAND  Final   Special Requests BOTTLES DRAWN AEROBIC AND ANAEROBIC 5ML  Final   Culture NO GROWTH 2 DAYS  Final   Report Status PENDING  Incomplete     Scheduled Meds: . collagenase   Topical Daily  . darbepoetin (ARANESP) injection - DIALYSIS  60 mcg Intravenous Q Fri-HD  . doxercalciferol  4 mcg Intravenous Q M,W,F-HD  . enoxaparin (LOVENOX) injection  30 mg Subcutaneous Q24H  . feeding supplement (NEPRO CARB STEADY)  237 mL Oral BID BM  . insulin aspart  0-9 Units Subcutaneous TID WC  . lanthanum  1,000 mg Oral TID WC  . loratadine  10  mg Oral Daily  . metoCLOPramide  5 mg Oral TID AC & HS  . metoprolol tartrate  12.5 mg Oral BID  . multivitamin with minerals  1 tablet Oral Daily  . pantoprazole  40 mg Oral Q12H  . piperacillin-tazobactam (ZOSYN)  IV  2.25 g Intravenous 3 times per day  . pneumococcal 23 valent vaccine  0.5 mL Intramuscular Tomorrow-1000  . sertraline  75 mg Oral Daily  . sodium chloride  3 mL Intravenous Q12H  . vancomycin  500 mg Intravenous Q M,W,F-HD   Continuous Infusions:    Guneet Delpino, DO  [facility] Pager [phone]  If 7PM-7AM, please contact night-coverage [email or web address] Password [email or web address] 12/12/2014, 5:52 PM   LOS: 2 days

## 2014-12-12 NOTE — Clinical Documentation Improvement (Signed)
Internal Medicine  Can the diagnosis of Malnutrition be further specified?   Document Severity - Severe(third degree), Moderate (second degree), Mild (first degree)  Other condition  Unable to clinically determine  Document any associated diagnoses/conditions Chronic wounds/infections Inflammation complex   Supporting Information: :  *Per Consult notes 8/18 & 19: Malnutrition**   Please exercise your independent, professional judgment when responding. A specific answer is not anticipated or expected.   Thank You, Alesia Richards, RN CDS [facility] Health Information Management [email or web address] [phone]

## 2014-12-12 NOTE — Progress Notes (Signed)
MRI shows no abscesses or osteo.  No surgical indications at this time.  Continue IV abx for cellulitis.  Local wound care per wound RN.

## 2014-12-12 NOTE — Progress Notes (Signed)
Subjective:   No complaints- had a good night.   Objective Filed Vitals:   12/11/14 1300 12/11/14 2019 12/12/14 0500 12/12/14 0557  BP: 124/61 152/52  87/53  Pulse: 88 95  82  Temp: 97.6 F (36.4 C) 97.8 F (36.6 C)  97.4 F (36.3 C)  TempSrc:      Resp: 16 16  16   Height:      Weight:   80.5 kg (177 lb 7.5 oz)   SpO2: 92% 100%  100%   Physical Exam General: resting in bed, alert and oriented, no acute distress. 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 Erythema and edema.  Dialysis Access: L AVF +b/t  Outpatient dialysis prescription: Mon/Wed/Fri at [facility] 4 hours, 180 dialyzer, blood flow rate 400, dialysate flow rate 600, estimated dry weight 77.5 kg, 2K/2.25 calcium, UF profile 4, linear sodium modeling, Hectorol 4 g 3 days a week, heparin 5100 units bolus, Mircera 50 g every 2 weeks. Left upper arm brachiocephalic fistula  Assessment/Plan: 1. Right leg/foot cellulitis: MRI without evidence of osteomyelitis or focal abscess. On empiric antibiotic coverage with intravenous vancomycin and Zosyn. Wound care as recommended by orthopedic surgery. Wound nurse following.  2. End-stage renal disease on hemodialysis:MWF [place], HD pending today.  3. Anemia of chronic kidney disease: hgb 9.8- slow drop give Aranesp today 4. Metabolic bone disease: Resume Hectorol with hemodialysis and restart phosphorus binder (Fosrenol) 5. Hypertension: Blood pressure is currently well controlled on low-dose beta blocker.  6. Malnutrition:  chronic wounds/infections with malnutrition-inflammation complex. Continue renal diet and supplementation with protein. 7. DM per primary 8. Obesity 9 PVD multiple amp and will end up with another  Jetty Duhamel, NP [facility] Beeper [phone] 12/12/2014,10:02 AM  LOS: 2 days  I have seen and examined  this patient and agree with the plan of care seen , eval, examined. .  Latrelle Bazar L 12/12/2014, 11:25 AM    Additional Objective Labs: Basic Metabolic Panel:  Recent Labs Lab 12/10/14 1757 12/11/14 0802 12/12/14 0432  NA 138 135 134*  K 3.3* 3.3* 3.5  CL 93* 90* 90*  CO2 32 30 30  GLUCOSE 134* 134* 242*  BUN 7 16 23*  CREATININE 2.38* 3.43* 4.96*  CALCIUM 9.9 9.7 9.3   Liver Function Tests:  Recent Labs Lab 12/11/14 0802  AST 27  ALT 11*  ALKPHOS 133*  BILITOT 0.7  PROT 8.1  ALBUMIN 2.9*   No results for input(s): LIPASE, AMYLASE in the last 168 hours. CBC:  Recent Labs Lab 12/10/14 1757 12/11/14 0802 12/12/14 0432  WBC 13.2* 12.9* 12.8*  NEUTROABS  --  10.8*  --   HGB 11.8* 10.7* 9.8*  HCT 35.2* 33.8* 30.5*  MCV 98.9 102.1* 99.3  PLT 199 202 205   Blood Culture    Component Value Date/Time   SDES BLOOD RIGHT HAND 12/10/2014 1850   SPECREQUEST BOTTLES DRAWN AEROBIC AND ANAEROBIC 12/10/2014 1850   CULT NO GROWTH < 24 HOURS 12/10/2014 1850   REPTSTATUS PENDING 12/10/2014 1850    Cardiac Enzymes: No results for input(s): CKTOTAL, CKMB, CKMBINDEX, TROPONINI in the last 168 hours. CBG:  Recent Labs Lab 12/11/14 0629 12/11/14 1118 12/11/14 1603 12/11/14 2124 12/12/14 0626  GLUCAP 153* 198* 196* 240* 234*   Iron Studies: No results for input(s): IRON, TIBC, TRANSFERRIN, FERRITIN in the last 72 hours. @  lablastinr3@ Studies/Results: Mr Foot Right Wo Contrast  12/11/2014   CLINICAL DATA:  Nonhealing draining right foot wound. History of partial right foot amputation, diabetes and hemodialysis.  EXAM: MRI OF THE RIGHT FOREFOOT WITHOUT CONTRAST  TECHNIQUE: Multiplanar, multisequence MR imaging was performed. No intravenous contrast was administered.  COMPARISON:  Radiographs 12/10/2014.  FINDINGS: Corresponding with the radiographic abnormalities are ill-defined soft tissue edema and small foci of low signal lateral to the midfoot  corresponding with soft tissue emphysema. No foreign bodies are identified. There is no drainable fluid collection in this area. The overlying skin appears ulcerated. No other focal inflammatory changes are identified. There is mild T2 hyperintensity throughout the forefoot musculature.  Patient is status post amputation of the great toe as well as amputation through the base of the third metatarsal. 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 There is no evidence of osteomyelitis. The alignment at the Lisfranc joint appears normal.  IMPRESSION: 1. Inflammatory changes and soft tissue emphysema lateral to the midfoot. No drainable soft tissue abscess identified. 2. No evidence of osteomyelitis. 3. Postsurgical changes as described. 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   Electronically Signed   By: Carey Bullocks M.D.   On: 12/11/2014 07:52   Dg Foot Complete Right  12/10/2014   CLINICAL DATA:  Ulcers on the right foot for 2 weeks. History of prior amputations.  EXAM: RIGHT FOOT COMPLETE - 3+ VIEW  COMPARISON:  Plain films of the right foot 01/22/2012.  FINDINGS: Again seen is postoperative change of resection of the great toe and bulk of the third ray. No bony destructive change or periosteal reaction is identified. There is some gas in the soft tissues along the lateral aspect of the foot at the level of the cuboid. Extensive vascular calcifications are noted.  IMPRESSION: Soft tissue gas in the lateral aspect of the foot at the level of the cuboid. Negative for plain film evidence of osteomyelitis.  Status post resection of the great toe at the bulk of the third ray.  Atherosclerosis.   Electronically Signed   By: Drusilla Kanner M.D.   On: 12/10/2014 19:39   Medications:   . collagenase   Topical Daily  . doxercalciferol  4 mcg  Intravenous Q M,W,F-HD  . enoxaparin (LOVENOX) injection  30 mg Subcutaneous Q24H  . feeding supplement (NEPRO CARB STEADY)  237 mL Oral BID BM  . insulin aspart  0-9 Units Subcutaneous TID WC  . lanthanum  1,000 mg Oral TID WC  . loratadine  10 mg Oral Daily  . metoCLOPramide  5 mg Oral TID AC & HS  . metoprolol tartrate  12.5 mg Oral BID  . multivitamin with minerals  1 tablet Oral Daily  . pantoprazole  40 mg Oral Q12H  . piperacillin-tazobactam (ZOSYN)  IV  2.25 g Intravenous 3 times per day  . pneumococcal 23 valent vaccine  0.5 mL Intramuscular Tomorrow-1000  . sertraline  75 mg Oral Daily  . sodium chloride  3 mL Intravenous Q12H  . vancomycin  500 mg Intravenous Q M,W,F-HD

## 2014-12-12 NOTE — Procedures (Signed)
I was present at this session.  I have reviewed the session itself and made appropriate changes.   HD via LUA AVF, bp90-110.  Access press ok.   DETERDING,JAMES L 8/19/201611:23 AM

## 2014-12-12 NOTE — Progress Notes (Signed)
Inpatient Diabetes Program Recommendations  AACE/ADA: New Consensus Statement on Inpatient Glycemic Control (2013)  Target Ranges:  Prepandial:   less than 140 mg/dL      Peak postprandial:   less than 180 mg/dL (1-2 hours)      Critically ill patients:  140 - 180 mg/dL   Results for [name], [name] (MRN [id number]) as of 12/12/2014 10:29  Ref. Range 12/11/2014 06:29 12/11/2014 11:18 12/11/2014 16:03 12/11/2014 21:24 12/12/2014 06:26  Glucose-Capillary Latest Ref Range: 65-99 mg/dL 213 (H) 086 (H) 578 (H) 240 (H) 234 (H)    Diabetes history: DM2 Outpatient Diabetes medications: Levemir 10 units daily, Novolog 0-10 units TID with melas (Monday, Wednesday, Friday) Current orders for Inpatient glycemic control: Novolog 0-9 units TID with meals  Inpatient Diabetes Program Recommendations Insulin - Basal: Please consider ordering low dose basal insulin; recommend ordering Levemir 5 units daily. Correction (SSI): Please consider ordering Novolog bedtime correction scale.  Thanks, Orlando Penner, RN, MSN, CCRN, CDE Diabetes Coordinator Inpatient Diabetes Program [phone] (Team Pager from 8am to 5pm) [phone] ([facility] office) [phone] [facility] office) [phone] [facility] office)

## 2014-12-13 DIAGNOSIS — E11621 Type 2 diabetes mellitus with foot ulcer: Secondary | ICD-10-CM | POA: Diagnosis not present

## 2014-12-13 LAB — CBC
HCT: 32.9 % — ABNORMAL LOW (ref 36.0–46.0)
Hemoglobin: 10.6 g/dL — ABNORMAL LOW (ref 12.0–15.0)
MCH: 32.5 pg (ref 26.0–34.0)
MCHC: 32.2 g/dL (ref 30.0–36.0)
MCV: 100.9 fL — ABNORMAL HIGH (ref 78.0–100.0)
PLATELETS: 193 10*3/uL (ref 150–400)
RBC: 3.26 MIL/uL — ABNORMAL LOW (ref 3.87–5.11)
RDW: 14.4 % (ref 11.5–15.5)
WBC: 12.7 10*3/uL — ABNORMAL HIGH (ref 4.0–10.5)

## 2014-12-13 LAB — GLUCOSE, CAPILLARY: GLUCOSE-CAPILLARY: 272 mg/dL — AB (ref 65–99)

## 2014-12-13 MED ORDER — VANCOMYCIN HCL 500 MG IV SOLR: 750.0000 mg | INTRAVENOUS | Status: DC

## 2014-12-13 MED ORDER — VANCOMYCIN HCL 500 MG IV SOLR
250.0000 mg | Freq: Once | INTRAVENOUS | Status: AC
  Administered 2014-12-13: 250 mg via INTRAVENOUS
  Filled 2014-12-13: qty 250

## 2014-12-13 MED ORDER — VANCOMYCIN HCL IN DEXTROSE 750-5 MG/150ML-% IV SOLN: 750.0000 mg | INTRAVENOUS | Status: DC

## 2014-12-13 NOTE — Progress Notes (Signed)
Subjective: Interval History: has no complaint .  Objective: Vital signs in last 24 hours: Temp:  [98.4 F (36.9 C)-98.6 F (37 C)] 98.6 F (37 C) (08/20 0428) Pulse Rate:  [69-88] 88 (08/20 0428) Resp:  [16] 16 (08/20 0428) BP: (91-129)/(37-93) 129/65 mmHg (08/20 0428) SpO2:  [94 %-98 %] 94 % (08/20 0428) Weight:  [77 kg (169 lb 12.1 oz)-78.8 kg (173 lb 11.6 oz)] 77 kg (169 lb 12.1 oz) (08/19 1430) Weight change: -1.7 kg (-3 lb 12 oz)  Intake/Output from previous day: 08/19 0701 - 08/20 0700 In: 840 [P.O.:840] Out: 2000  Intake/Output this shift:    General appearance: moderately obese, pale and slowed mentation Resp: diminished breath sounds bilaterally Cardio: regular rate and rhythm and systolic murmur: holosystolic 2/6, blowing at apex GI: obese, pos bs, soft Extremities: LUA AVF B&T, LLA amp, L BKA, dressing R foot  Lab Results:  Recent Labs  12/12/14 0432 12/13/14 0410  WBC 12.8* 12.7*  HGB 9.8* 10.6*  HCT 30.5* 32.9*  PLT 205 193   BMET:  Recent Labs  12/11/14 0802 12/12/14 0432  NA 135 134*  K 3.3* 3.5  CL 90* 90*  CO2 30 30  GLUCOSE 134* 242*  BUN 16 23*  CREATININE 3.43* 4.96*  CALCIUM 9.7 9.3   No results for input(s): PTH in the last 72 hours. Iron Studies: No results for input(s): IRON, TIBC, TRANSFERRIN, FERRITIN in the last 72 hours.  Studies/Results: No results found.  I have reviewed the patient's current medications.  Assessment/Plan: 1 ESRD HD yest 2 DM controlled 3 Anemia improving 4 HPTH meds 5 PVD 6 Cellulitis on AB, will arrange at [facility] P HD M, W,F, AB, arrange at [facility]    LOS: 3 days   Ryheem Jay L 12/13/2014,9:10 AM

## 2014-12-13 NOTE — Clinical Social Work Note (Signed)
Clinical Social Worker facilitated patient discharge including contacting patient family (left message with patient's husband, Ephriam Knuckles) and facility to confirm patient discharge plans.  Clinical information faxed to facility and family agreeable with plan.  CSW arranged ambulance transport via [facility] to [facility].  RN to call report prior to discharge.  Clinical Social Worker will sign off for now as social work intervention is no longer needed. Please consult [place] again if new need arises.  Derenda Fennel, MSW, LCSWA [phone] 12/13/2014 10:46 AM

## 2014-12-13 NOTE — Progress Notes (Signed)
Initial Nutrition Assessment  DOCUMENTATION CODES:   Obesity unspecified  INTERVENTION:   Nepro Shake po BID, each supplement provides 425 kcal and 19 grams protein   NUTRITION DIAGNOSIS:   Increased nutrient needs related to wound healing as evidenced by estimated needs.   GOAL:   Patient will meet greater than or equal to 90% of their needs   MONITOR:   PO intake, Supplement acceptance, Labs, Weight trends, Skin  REASON FOR ASSESSMENT:   Consult Assessment of nutrition requirement/status  ASSESSMENT:   Pt with a history of ESRD, peripheral vascular disease, hypertension, diabetes mellitus, and anemia of CKD presented with one-week history of increasing erythema and pain about her right foot. Pt with diabetic foot infection/cellulits.   Labs reviewed: sodium low CBG's: 124-272 Medications reviewed and include: MVI, Reglan, novolog  Pt reports good appetite (consumed 100% of Breakfast) and no recent weight loss although she does not know what she usually weighs. She is wheelchair bound at SNF.  Has L BKA and L hand amputation Nutrition-Focused physical exam completed. Findings are no fat depletion, no muscle depletion, and mild edema.    Diet Order:  Diet renal/carb modified with fluid restriction Diet-HS Snack?: Nothing; Room service appropriate?: Yes; Fluid consistency:: Thin Diet - low sodium heart healthy  Skin:  Wound (see comment) (diabetic foot ulcer)  Last BM:  8/18  Height:   Ht Readings from Last 1 Encounters:  12/10/14  (1.626 m)    Weight:   Wt Readings from Last 1 Encounters:  12/12/14 169 lb 12.1 oz (77 kg)    Ideal Body Weight:  50.5 kg  BMI:  31.3  Estimated Nutritional Needs:   Kcal:  1700-1900  Protein:  90-110 grams  Fluid:  >/= 1.2 L/day  EDUCATION NEEDS:   No education needs identified at this time  Kendell Bane RD, LDN, CNSC [phone] Pager [phone] After Hours Pager

## 2014-12-13 NOTE — Discharge Summary (Addendum)
Physician Discharge Summary  [name] DJM:[id number] DOB: 03-29-1961 DOA: 12/10/2014  PCP: Donetta Potts, MD  Admit date: 12/10/2014 Discharge date: 12/13/2014  Recommendations for Outpatient Follow-up:  1. Pt will need to follow up with PCP in 2 weeks post discharge 2. Please obtain CBC and Vancomycin troughs once weekly starting 12/15/14 while on vancomycin and adjust dosing accordingly 3. Continue vancomycin 750 mg with dialysis and ceftazidime 2 g of dialysis with the last dose on 01/09/2015. 4. Please follow up at [facility] 12/25/2014 9:15 AM   Discharge Diagnoses:  Diabetic foot infection/Cellulitis of foot -12/10/2014 MRI foot--inflammatory changes of the soft tissue with emphysema of the lateral to the midfoot. No acute osteomyelitis -Continue antibiotics pending culture data -Appreciated wound care consult--apply Santyl to eschar daily then covered with moist dressing--change dressing daily; apply Vaseline gauze to right foot plantar wound daily; controlled areas with Kerlix. -ortho following appreciated -ESR--130 -CRP--17.9 -check ABIs--unable to perform due to noncompressible vessels -Although the MRI was negative for acute osteomyelitis, will treat clinically as osteomyelitis due to palpable bone, wound appearance on exam, and significant elevation of ESR -plan to send to SNF with Vancomycin 53m and ceftazidime 2 grams on HD (MWF) for total of 4 more weeks with last dose on 01/09/15 -[facility]   ESRD -Consult nephrology for maintenance dialysis -Continue fosrenol -Nephrology was notified of the patient's discharge and continued antibiotics at [facility]. Diabetes mellitus type 2--diet controlled -Hemoglobin A1c--pending at time of d/c -NovoLog sliding scale -elevated CBGs due to infection -Patient will restart her usual dose of Levemir 10 units at bedtime as well as  NovoLog sliding scale Hypertension -Continue metoprolol tartrate Anemia of CKD -Baseline hemoglobin 11-12 History of PEA Arrest    Family Communication: husband updated at beside Disposition Plan: Home when medically stable  Discharge Condition: Stable   Disposition:  skin nursing facility--[facility]  Follow-up Information    Follow up with [facility] on 12/25/2014.   Specialty:  Wound Care   Why:  F/U as directed by MD at discharge You have a scheduled appt on 12/25/14 at 9:15 am at the wound care center Please attend or call to reschedule as needed Be Aware this is the first available appt as of 12/11/14    Contact information:   [address] [id number] [phone]     Diet: Renal diet, 1200 mL fluid restriction  Wt Readings from Last 3 Encounters:  12/12/14 77 kg (169 lb 12.1 oz)  11/15/13 58.968 kg (130 lb)  01/22/13 58.968 kg (130 lb)    History of present illness:  54year old female with a history of ESRD, peripheral vascular disease, hypertension, diabetes mellitus, and anemia of CKD presented with one-week history of increasing erythema and pain about her right foot. The patient has had presentations to the left hand, left lower extremity. She states that over the last 2-3 days she [facility] However because of increasing erythema and pain the patient presented to emergency Department. She was noted to have WBC 13.2 but remained hemodynamically stable. X-rays revealed soft tissue gas. Orthopedics was consulted. The patient was started on vancomycin and Zosyn.  MRI of the foot did not reveal drainable abscess or acute osteomyelitis. However given the patient's  physical exam findings and elevated sedimentation rate, the patient will be treated with 4 weeks of vancomycin and ceftazidime to be received on  dialysis. Her last dose would be 01/09/2015.  Orthopedics was consulted. They did not feel the patient needed any surgical intervention. The patient has an appointment at the [facility] wound care center 12/25/2014  Consultants: Nephrology Ortho--Xu  Discharge Exam: Filed Vitals:   12/13/14 0428  BP: 129/65  Pulse: 88  Temp: 98.6 F (37 C)  Resp: 16   Filed Vitals:   12/12/14 1415 12/12/14 1430 12/12/14 2047 12/13/14 0428  BP: 101/59 102/65 107/57 129/65  Pulse: 71 76 81 88  Temp:   98.4 F (36.9 C) 98.6 F (37 C)  TempSrc:   Oral Oral  Resp: 16 16 16 16   Height:      Weight:  77 kg (169 lb 12.1 oz)    SpO2:   98% 94%   General: A&O x 3, NAD, pleasant, cooperative Cardiovascular: RRR, no rub, no gallop, no S3 Respiratory: CTAB, no wheeze, no rhonchi Abdomen:soft, nontender, nondistended, positive bowel sounds Extremities: No edema, No lymphangitis, no petechiae; right foot without any purulent drainage, lymphangitis, crepitance   Discharge Instructions      Discharge Instructions    Diet - low sodium heart healthy    Complete by:  As directed      Increase activity slowly    Complete by:  As directed             Medication List    STOP taking these medications        loperamide 2 MG capsule  Commonly known as:  IMODIUM      TAKE these medications        cefTAZidime 2 g in dextrose 5 % 50 mL  Inject 2 g into the vein every Monday, Wednesday, and Friday with hemodialysis. LAST DOSE on 01/09/15  Start taking on:  12/15/2014     collagenase ointment  Commonly known as:  SANTYL  Apply topically daily. To eschar on R-foot and cover with moist dressing and wrap with Kerlix.  Change once daily     feeding supplement (NEPRO CARB STEADY) Liqd  Take 237 mLs by mouth 2 (two) times daily between meals.     guaiFENesin-codeine 100-10 MG/5ML syrup  Commonly known as:  ROBITUSSIN AC  Take 30 mLs by mouth every 6 (six) hours as needed for cough.      HYDROcodone-acetaminophen 5-325 MG per tablet  Commonly known as:  NORCO/VICODIN  Take 1-2 tablets by mouth every 6 (six) hours as needed for moderate pain.     insulin aspart 100 UNIT/ML injection  Commonly known as:  novoLOG  Inject 0-10 Units into the skin 3 (three) times a week. Mondays, Wednesdays and Fridays     insulin detemir 100 UNIT/ML injection  Commonly known as:  LEVEMIR  Inject 10 Units into the skin at bedtime.     lanthanum 1000 MG chewable tablet  Commonly known as:  FOSRENOL  Chew 1 tablet (1,000 mg total) by mouth 3 (three) times daily with meals.     loratadine 10 MG tablet  Commonly known as:  CLARITIN  Take 10 mg by mouth daily.     meclizine 25 MG tablet  Commonly known as:  ANTIVERT  Take 25 mg by mouth 3 (three) times daily as needed for dizziness.     Melatonin 5 MG Tabs  Take 5 mg by mouth at bedtime.  metoCLOPramide 5 MG tablet  Commonly known as:  REGLAN  Take 5 mg by mouth 4 (four) times daily.     metoprolol tartrate 25 MG tablet  Commonly known as:  LOPRESSOR  Take 12.5 mg by mouth See admin instructions. 12.14m twice daily on Sunday, Tuesday, Thursday, Saturday     multivitamins with iron Tabs tablet  Take 1 tablet by mouth daily.     pantoprazole 40 MG tablet  Commonly known as:  PROTONIX  Take 40 mg by mouth every 12 (twelve) hours.     traMADol 50 MG tablet  Commonly known as:  ULTRAM  Take 50 mg by mouth every 12 (twelve) hours as needed for moderate pain.     vancomycin 750 mg in sodium chloride 0.9 % 100 mL  Inject 750 mg into the vein every Monday, Wednesday, and Friday with hemodialysis. LAST DOSE on 01/09/15     venlafaxine XR 75 MG 24 hr capsule  Commonly known as:  EFFEXOR-XR  Take 225 mg by mouth daily with breakfast.         The results of significant diagnostics from this hospitalization (including imaging, microbiology, ancillary and laboratory) are listed below for reference.    Significant Diagnostic  Studies: Mr Foot Right Wo Contrast  12/11/2014   CLINICAL DATA:  Nonhealing draining right foot wound. History of partial right foot amputation, diabetes and hemodialysis.  EXAM: MRI OF THE RIGHT FOREFOOT WITHOUT CONTRAST  TECHNIQUE: Multiplanar, multisequence MR imaging was performed. No intravenous contrast was administered.  COMPARISON:  Radiographs 12/10/2014.  FINDINGS: Corresponding with the radiographic abnormalities are ill-defined soft tissue edema and small foci of low signal lateral to the midfoot corresponding with soft tissue emphysema. No foreign bodies are identified. There is no drainable fluid collection in this area. The overlying skin appears ulcerated. No other focal inflammatory changes are identified. There is mild T2 hyperintensity throughout the forefoot musculature.  Patient is status post amputation of the great toe as well as amputation through the base of the third metatarsal. 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 There is no evidence of osteomyelitis. The alignment at the Lisfranc joint appears normal.  IMPRESSION: 1. Inflammatory changes and soft tissue emphysema lateral to the midfoot. No drainable soft tissue abscess identified. 2. No evidence of osteomyelitis. 3. Postsurgical changes as described. 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   Electronically Signed   By: WRichardean SaleM.D.   On: 12/11/2014 07:52   Dg Foot Complete Right  12/10/2014   CLINICAL DATA:  Ulcers on the right foot for 2 weeks. History of prior amputations.  EXAM: RIGHT FOOT COMPLETE - 3+ VIEW  COMPARISON:  Plain films of the right foot 01/22/2012.  FINDINGS: Again seen is postoperative change of resection of the great toe and bulk of the third ray. No bony destructive change or periosteal reaction is identified. There is some gas in  the soft tissues along the lateral aspect of the foot at the level of the cuboid. Extensive vascular calcifications are noted.  IMPRESSION: Soft tissue gas in the lateral aspect of the foot at the level of the cuboid. Negative for plain film evidence of osteomyelitis.  Status post resection of the great toe at the bulk of the third ray.  Atherosclerosis.   Electronically Signed   By: TInge RiseM.D.   On: 12/10/2014 19:39  Microbiology: Recent Results (from the past 240 hour(s))  Blood culture (routine x 2)     Status: None (Preliminary result)   Collection Time: 12/10/14  6:40 PM  Result Value Ref Range Status   Specimen Description BLOOD RIGHT ARM  Final   Special Requests BOTTLES DRAWN AEROBIC AND ANAEROBIC 5ML  Final   Culture NO GROWTH 2 DAYS  Final   Report Status PENDING  Incomplete  Blood culture (routine x 2)     Status: None (Preliminary result)   Collection Time: 12/10/14  6:50 PM  Result Value Ref Range Status   Specimen Description BLOOD RIGHT HAND  Final   Special Requests BOTTLES DRAWN AEROBIC AND ANAEROBIC 5ML  Final   Culture NO GROWTH 2 DAYS  Final   Report Status PENDING  Incomplete     Labs: Basic Metabolic Panel:  Recent Labs Lab 12/10/14 1757 12/11/14 0802 12/12/14 0432  NA 138 135 134*  K 3.3* 3.3* 3.5  CL 93* 90* 90*  CO2 32 30 30  GLUCOSE 134* 134* 242*  BUN 7 16 23*  CREATININE 2.38* 3.43* 4.96*  CALCIUM 9.9 9.7 9.3   Liver Function Tests:  Recent Labs Lab 12/11/14 0802  AST 27  ALT 11*  ALKPHOS 133*  BILITOT 0.7  PROT 8.1  ALBUMIN 2.9*   No results for input(s): LIPASE, AMYLASE in the last 168 hours. No results for input(s): AMMONIA in the last 168 hours. CBC:  Recent Labs Lab 12/10/14 1757 12/11/14 0802 12/12/14 0432 12/13/14 0410  WBC 13.2* 12.9* 12.8* 12.7*  NEUTROABS  --  10.8*  --   --   HGB 11.8* 10.7* 9.8* 10.6*  HCT 35.2* 33.8* 30.5* 32.9*  MCV 98.9 102.1* 99.3 100.9*  PLT 199 202 205 193   Cardiac  Enzymes: No results for input(s): CKTOTAL, CKMB, CKMBINDEX, TROPONINI in the last 168 hours. BNP: Invalid input(s): POCBNP CBG:  Recent Labs Lab 12/11/14 2124 12/12/14 0626 12/12/14 1638 12/12/14 2111 12/13/14 0609  GLUCAP 240* 234* 124* 223* 272*    Time coordinating discharge:  Greater than 30 minutes  Signed:  Kyrie Fludd, DO [facility] Pager: [phone] 12/13/2014, 9:03 AM

## 2014-12-13 NOTE — Progress Notes (Signed)
ANTIBIOTIC CONSULT NOTE - FOLLOW UP  Pharmacy Consult for Vancomycin Indication: cellulitis with palpable bone  Allergies  Allergen Reactions  . Codeine Hives  . Levofloxacin Other (See Comments)    unknown  . Penicillins Hives    Patient Measurements: Height:  (162.6 cm) Weight: 169 lb 12.1 oz (77 kg) IBW/kg (Calculated) : 54.7  Vital Signs: Temp: 98.6 F (37 C) (08/20 0428) Temp Source: Oral (08/20 0428) BP: 129/65 mmHg (08/20 0428) Pulse Rate: 88 (08/20 0428) Intake/Output from previous day: 08/19 0701 - 08/20 0700 In: 840 [P.O.:840] Out: 2000  Intake/Output from this shift:    Labs:  Recent Labs  12/10/14 1757 12/11/14 0802 12/12/14 0432 12/13/14 0410  WBC 13.2* 12.9* 12.8* 12.7*  HGB 11.8* 10.7* 9.8* 10.6*  PLT 199 202 205 193  CREATININE 2.38* 3.43* 4.96*  --    Estimated Creatinine Clearance: 13.3 mL/min (by C-G formula based on Cr of 4.96). No results for input(s): VANCOTROUGH, VANCOPEAK, VANCORANDOM, GENTTROUGH, GENTPEAK, GENTRANDOM, TOBRATROUGH, TOBRAPEAK, TOBRARND, AMIKACINPEAK, AMIKACINTROU, AMIKACIN in the last 72 hours.   Microbiology: Recent Results (from the past 720 hour(s))  Blood culture (routine x 2)     Status: None (Preliminary result)   Collection Time: 12/10/14  6:40 PM  Result Value Ref Range Status   Specimen Description BLOOD RIGHT ARM  Final   Special Requests BOTTLES DRAWN AEROBIC AND ANAEROBIC  Final   Culture NO GROWTH 2 DAYS  Final   Report Status PENDING  Incomplete  Blood culture (routine x 2)     Status: None (Preliminary result)   Collection Time: 12/10/14  6:50 PM  Result Value Ref Range Status   Specimen Description BLOOD RIGHT HAND  Final   Special Requests BOTTLES DRAWN AEROBIC AND ANAEROBIC  Final   Culture NO GROWTH 2 DAYS  Final   Report Status PENDING  Incomplete    Anti-infectives    Start     Dose/Rate Route Frequency Ordered Stop   12/15/14 1200  cefTAZidime (FORTAZ) 2 g in dextrose 5 % 50  mL IVPB     2 g 100 mL/hr over 30 Minutes Intravenous Every M-W-F (Hemodialysis) 12/12/14 1806     12/15/14 0000  cefTAZidime 2 g in dextrose 5 % 50 mL     2 g 100 mL/hr over 30 Minutes Intravenous Every M-W-F (Hemodialysis) 12/12/14 1830     12/12/14 1900  cefTAZidime (FORTAZ) 2 g in dextrose 5 % 50 mL IVPB    Comments:  PATIENT TOLERATES ZOSYN   2 g 100 mL/hr over 30 Minutes Intravenous  Once 12/12/14 1806 12/12/14 2208   12/12/14 1800  vancomycin (VANCOCIN) IVPB 1000 mg/200 mL premix  Status:  Discontinued     1,000 mg 200 mL/hr over 60 Minutes Intravenous Every 48 hours 12/10/14 2216 12/11/14 1401   12/12/14 1200  vancomycin (VANCOCIN) 500 mg in sodium chloride 0.9 % 100 mL IVPB     500 mg 100 mL/hr over 60 Minutes Intravenous Every M-W-F (Hemodialysis) 12/11/14 1401     12/12/14 0000  vancomycin 500 mg in sodium chloride 0.9 % 100 mL     500 mg 100 mL/hr over 60 Minutes Intravenous Every M-W-F (Hemodialysis) 12/12/14 1830     12/11/14 2200  piperacillin-tazobactam (ZOSYN) IVPB 2.25 g  Status:  Discontinued     2.25 g 100 mL/hr over 30 Minutes Intravenous 3 times per day 12/11/14 1401 12/12/14 1806   12/10/14 2300  piperacillin-tazobactam (ZOSYN) IVPB 3.375 g  Status:  Discontinued     3.375 g 12.5 mL/hr over 240 Minutes Intravenous 3 times per day 12/10/14 2206 12/11/14 1401   12/10/14 1845  vancomycin (VANCOCIN) IVPB 1000 mg/200 mL premix     1,000 mg 200 mL/hr over 60 Minutes Intravenous  Once 12/10/14 1838 12/10/14 2006      Assessment: 54 yoF admitted 12/10/2014 with diabetic foot/cellulitis with palpable bone. Will treat clinically as osteomyelitis although MRI is negative. Ms. Hearns has ESRD with HD on MWF. Pt weight initially entered as 59 kg, correct weight has been updated to be 77 kg. Due to patient's current weight will adjust dosing.  Goal of Therapy:  Vancomycin trough level 15-20 mcg/ml  Plan:  - Will give supplemental Vanc 250 mg IV x1 today - Change dose to  Vanc 750 mg IV after each HD session - next due 7/22 - F/u C&S, clinical progression and toleration of HD - Check VT at [facility]  Casilda Carls, PharmD. Clinical Pharmacist Resident Pager: [phone]

## 2014-12-15 ENCOUNTER — Encounter: Payer: Self-pay | Admitting: Vascular Surgery

## 2014-12-15 LAB — CULTURE, BLOOD (ROUTINE X 2)
CULTURE: NO GROWTH
Culture: NO GROWTH

## 2014-12-15 LAB — HEMOGLOBIN A1C
HEMOGLOBIN A1C: 7.1 % — AB (ref 4.8–5.6)
MEAN PLASMA GLUCOSE: 157 mg/dL

## 2014-12-16 ENCOUNTER — Ambulatory Visit: Payer: Medicare PPO | Admitting: Vascular Surgery

## 2014-12-25 ENCOUNTER — Encounter (HOSPITAL_BASED_OUTPATIENT_CLINIC_OR_DEPARTMENT_OTHER): Payer: Medicare PPO | Attending: Internal Medicine

## 2015-03-05 ENCOUNTER — Encounter: Payer: Self-pay | Admitting: Vascular Surgery

## 2015-03-10 ENCOUNTER — Ambulatory Visit (INDEPENDENT_AMBULATORY_CARE_PROVIDER_SITE_OTHER): Payer: Medicare PPO | Admitting: Vascular Surgery

## 2015-03-10 ENCOUNTER — Encounter: Payer: Self-pay | Admitting: Vascular Surgery

## 2015-03-10 ENCOUNTER — Other Ambulatory Visit: Payer: Self-pay

## 2015-03-10 VITALS — BP 154/80 | HR 20 | Ht 64.0 in | Wt 169.0 lb

## 2015-03-10 DIAGNOSIS — I739 Peripheral vascular disease, unspecified: Secondary | ICD-10-CM

## 2015-03-10 DIAGNOSIS — L97513 Non-pressure chronic ulcer of other part of right foot with necrosis of muscle: Secondary | ICD-10-CM

## 2015-03-10 NOTE — Progress Notes (Signed)
HISTORY AND PHYSICAL   History of Present Illness:  Patient is a 54 y.o. year old female who presents for evaluation of her right foot wounds.  She states she has been treating the wounds with santyl daily dressing changes at her care facility.   The 5th toe was injured about a month before and it is getting worse.  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  She has ESRD and goes to HD M-W-F via right UE AV fistula.    Other medical problems include has Diabetes mellitus; HTN (hypertension); Anemia; ESRD (end stage renal disease) on dialysis (HCC); Obesities, morbid (HCC); Osteomyelitis (HCC); Secondary hyperparathyroidism (of renal origin); Complication of vascular access for dialysis; Septic shock(785.52); Encephalopathy acute; Respiratory failure (HCC); Severe sepsis(995.92); Thrombocytopenia (HCC); Altered mental status; Seizure-like activity (HCC); Mechanical complication of other vascular device, implant, and graft; Ulcer of lower limb, unspecified; Foot ulcer (HCC); Cellulitis of right foot; Diabetes mellitus type 2, controlled (HCC); Chronic anemia; Diabetic foot infection (HCC); Type 2 diabetes mellitus, controlled, with renal complications (HCC); Cellulitis and abscess of foot, except toes; Type II or unspecified type diabetes mellitus with other specified manifestations, not stated as uncontrolled; End stage renal disease (HCC); Unspecified essential hypertension; Unspecified local infection of skin and subcutaneous tissue; Renal dialysis status(V45.11); and Kidney dialysis status on her problem list.  Past Medical History  Diagnosis Date  . Hypertension   . Anemia   . Diabetes mellitus   . Peripheral vascular disease (HCC)   . Hemodialysis patient (HCC) 03/15/11    "Tues; Thurs; Sat; [facility]"  . Blood transfusion   . Chronic back pain   . Rash  03/15/11    "admitted me to find out what its' from; UE, waist, thighs, groin"  . Renal failure     hd 4/12  . Stroke [facility](HCC)     Past Surgical History  Procedure Laterality Date  . Insertion of dialysis catheter  07/29/10    right subclavian  . Arteriovenous graft placement  10/01/10    left upper arm  . Below knee leg amputation  2004    left  . Amputation  03/04/2011    Procedure: AMPUTATION DIGIT;  Surgeon: Nadara MustardMarcus V Duda, MD;  [facility];  Service: Orthopedics;  Laterality: Right;  RT GREAT TOE AMP  . Cataract extraction w/ intraocular lens implant  12/2009    right eye  . Carpal tunnel release  ~ 2009    left wrist  . Tee without cardioversion  03/18/2011    Procedure: TRANSESOPHAGEAL ECHOCARDIOGRAM (TEE);  Surgeon: Marca Anconaalton McLean, MD;  [facility];  Service: Cardiovascular;  Laterality: N/A;  . Hand amputation Left     Social History Social History  Substance Use Topics  . Smoking status: Never Smoker   . Smokeless tobacco: Never Used  . Alcohol Use: No    Family History Family History  Problem Relation Age of Onset  . Diabetes Mellitus II Mother   . Stroke Mother     Allergies  Allergies  Allergen Reactions  . Codeine Hives  . Levofloxacin Other (See Comments)    unknown  . Penicillins Hives     Current Outpatient Prescriptions  Medication Sig Dispense Refill  . cefTAZidime 2 g in dextrose 5 % 50 mL Inject 2 g into the vein every Monday, Wednesday, and Friday with hemodialysis. LAST  DOSE on 01/09/15 12 vial 0  . collagenase (SANTYL) ointment Apply topically daily. To eschar on R-foot and cover with moist dressing and wrap with Kerlix.  Change once daily 15 g 0  . guaiFENesin-codeine (ROBITUSSIN AC) 100-10 MG/5ML syrup Take 30 mLs by mouth every 6 (six) hours as needed for cough.    Marland Kitchen HYDROcodone-acetaminophen (NORCO/VICODIN) 5-325 MG per tablet Take 1-2 tablets by mouth every 6 (six) hours as needed for moderate pain. 20 tablet 0  . insulin  aspart (NOVOLOG) 100 UNIT/ML injection Inject 0-10 Units into the skin 3 (three) times a week. Mondays, Wednesdays and Fridays    . insulin detemir (LEVEMIR) 100 UNIT/ML injection Inject 10 Units into the skin at bedtime.    Marland Kitchen lanthanum (FOSRENOL) 1000 MG chewable tablet Chew 1 tablet (1,000 mg total) by mouth 3 (three) times daily with meals. (Patient taking differently: Chew 1,500 mg by mouth 3 (three) times daily with meals. ) 180 tablet 0  . loratadine (CLARITIN) 10 MG tablet Take 10 mg by mouth daily.    . meclizine (ANTIVERT) 25 MG tablet Take 25 mg by mouth 3 (three) times daily as needed for dizziness.    . Melatonin 5 MG TABS Take 5 mg by mouth at bedtime.    . metoCLOPramide (REGLAN) 5 MG tablet Take 5 mg by mouth 4 (four) times daily.    . metoprolol tartrate (LOPRESSOR) 25 MG tablet Take 12.5 mg by mouth See admin instructions. 12.5mg  twice daily on Sunday, Tuesday, Thursday, Saturday    . Multiple Vitamins-Iron (MULTIVITAMINS WITH IRON) TABS tablet Take 1 tablet by mouth daily.    . Nutritional Supplements (FEEDING SUPPLEMENT, NEPRO CARB STEADY,) LIQD Take 237 mLs by mouth 2 (two) times daily between meals. 60 Can 0  . pantoprazole (PROTONIX) 40 MG tablet Take 40 mg by mouth every 12 (twelve) hours.     . traMADol (ULTRAM) 50 MG tablet Take 50 mg by mouth every 12 (twelve) hours as needed for moderate pain.    . vancomycin 750 mg in sodium chloride 0.9 % 100 mL Inject 750 mg into the vein every Monday, Wednesday, and Friday with hemodialysis. LAST DOSE on 01/09/15 12 vial 0  . venlafaxine XR (EFFEXOR-XR) 75 MG 24 hr capsule Take 225 mg by mouth daily with breakfast.     No current facility-administered medications for this visit.    ROS:   General:  No weight loss, Fever, chills  HEENT: No recent headaches, no nasal bleeding, no visual changes, no sore throat  Neurologic: No dizziness, blackouts, seizures. No recent symptoms of stroke or mini- stroke. No recent episodes of  slurred speech, or temporary blindness.  History of CVA  Cardiac: No recent episodes of chest pain/pressure, no shortness of breath at rest.  No shortness of breath with exertion.  Denies history of atrial fibrillation or irregular heartbeat  Vascular: No history of rest pain in feet.  No history of claudication.  No history of non-healing ulcer, No history of DVT   Pulmonary: No home oxygen, no productive cough, no hemoptysis,  No asthma or wheezing  Musculoskeletal:   Arthritis,  Low back pain,   Joint pain  Hematologic:No history of hypercoagulable state.  No history of easy bleeding.  No history of anemia  Gastrointestinal: No hematochezia or melena,  No gastroesophageal reflux, no trouble swallowing  Urinary:  chronic Kidney disease,  on HD -  MWF or  TTHS,  Burning with  urination, [ ] Frequent urination, [ ] Difficulty urinating;   Skin: No rashes, open wound plantar right foot and dry gangrene 5th toe  Psychological: No history of anxiety,  No history of depression   Physical Examination  Filed Vitals:   03/10/15 1441 03/10/15 1443  BP: 177/82 154/80  Pulse: 20   Height: 5' 4" (1.626 m)   Weight: 169 lb (76.658 kg)   SpO2: 97%     Body mass index is 28.99 kg/(m^2).  General:  Alert and oriented, no acute distress HEENT: Normal Pulmonary: Clear to auscultation bilaterally Cardiac: Regular Rate and Rhythm without murmur Abdomen: Soft, non-tender, non-distended, no mass, no scars Skin: No rash Extremity Pulses:  Non  radial,  palpable brachial, non palpable femoral, dorsalis pedis, posterior tibial pulses bilaterally Musculoskeletal: multiple amputated digits , left hand amputation, left BKA and fingers and toes on the right UE and LE extremities Neurologic: Upper and lower extremity motor 5/5 and symmetric  DATA:   ABI monophasic flow with suggesting arterial disease    ASSESSMENT:   PAD, 5th toe dry gangrene and plantar wound full  thickness with infection DM type I uncontrolled ESRD on HD M-W-F [address]   PLAN:   BKA verses AKA Nov. 18 th 2016 by Dr. James Lawson.  The patient is in agreement with the plan and will like;ly be in the hospital for 2-3 days.  She will be discharged back to her current SNF [facility].   COLLINS, EMMA MAUREEN PA-C [facility]  The patient was seen in conjunction with Dr. Reagan Behlke I have examined the patient, reviewed and agree with above. Discussed with the patient. Obviously not a viable foot. Bili considerations above-knee versus below-knee amputation. She clinically does appear to have adequate flow to heal a below-knee amputation. I did explain that this would give her minimal benefit from a quality of life standpoint due to her contralateral BKA in her left hand amputation and the multiple fingers amputated off her right hand. She wishes to consider this further and will discuss this prior to surgery. Surgery is scheduled for Dr. Lawson on 03/13/2015. She understands she will be hospitalized for several days and return to nursing facility. We will schedule an inpatient hemodialysis  Godfrey Tritschler, MD 03/10/2015 4:07 PM   

## 2015-03-12 MED ORDER — VANCOMYCIN HCL IN DEXTROSE 1-5 GM/200ML-% IV SOLN
1000.0000 mg | INTRAVENOUS | Status: AC
  Administered 2015-03-13: 1000 mg via INTRAVENOUS
  Filled 2015-03-12: qty 200

## 2015-03-12 MED ORDER — CHLORHEXIDINE GLUCONATE CLOTH 2 % EX PADS: 6.0000 | MEDICATED_PAD | Freq: Once | CUTANEOUS | Status: DC

## 2015-03-12 MED ORDER — SODIUM CHLORIDE 0.9 % IV SOLN
INTRAVENOUS | Status: DC
  Administered 2015-03-13: 07:00:00 via INTRAVENOUS

## 2015-03-12 NOTE — Anesthesia Preprocedure Evaluation (Addendum)
Anesthesia Evaluation  Patient identified by MRN, date of birth, ID band Patient awake    Reviewed: Allergy & Precautions, NPO status , Patient's Chart, lab work & pertinent test results  Airway Mallampati: III  TM Distance: >3 FB Neck ROM: Full  Mouth opening: Limited Mouth Opening  Dental  (+) Dental Advisory Given   Pulmonary neg pulmonary ROS,    breath sounds clear to auscultation       Cardiovascular hypertension, Pt. on medications and Pt. on home beta blockers + Peripheral Vascular Disease   Rhythm:Regular Rate:Normal     Neuro/Psych CVA    GI/Hepatic negative GI ROS, Neg liver ROS,   Endo/Other  diabetes, Type 2, Insulin Dependent  Renal/GU ESRF and DialysisRenal diseaseK 3.6     Musculoskeletal   Abdominal   Peds  Hematology  (+) anemia , Hgb 11.6   Anesthesia Other Findings   Reproductive/Obstetrics                            Anesthesia Physical Anesthesia Plan  ASA: III  Anesthesia Plan: General   Post-op Pain Management:    Induction: Intravenous  Airway Management Planned: Oral ETT  Additional Equipment:   Intra-op Plan:   Post-operative Plan: Extubation in OR  Informed Consent: I have reviewed the patients History and Physical, chart, labs and discussed the procedure including the risks, benefits and alternatives for the proposed anesthesia with the patient or authorized representative who has indicated his/her understanding and acceptance.   Dental advisory given  Plan Discussed with: CRNA  Anesthesia Plan Comments:        Anesthesia Quick Evaluation

## 2015-03-12 NOTE — Progress Notes (Signed)
Pt SDW-Pre-op call completed by pt nurse, Tomasa RandJoy Yellordy., LPN. Nurse denies pt c/o SOB and chest pain. Nurse denies pt having a chest x ray or EKG within the last year. Nurse made aware to stop administering otc vitamins, herbal medications such as Melatonin. Please complete pt assessment on DOS.

## 2015-03-13 ENCOUNTER — Inpatient Hospital Stay (HOSPITAL_COMMUNITY): Payer: Medicare PPO | Admitting: Certified Registered"

## 2015-03-13 ENCOUNTER — Encounter (HOSPITAL_COMMUNITY)
Admission: RE | Disposition: A | Discharge status: Skilled Nursing Facility | Payer: Self-pay | Source: Ambulatory Visit | Attending: Internal Medicine

## 2015-03-13 ENCOUNTER — Inpatient Hospital Stay (HOSPITAL_COMMUNITY)
Admission: RE | Admit: 2015-03-13 | Discharge: 2015-03-17 | DRG: 239 | Disposition: A | Discharge status: Skilled Nursing Facility | Payer: Medicare PPO | Source: Ambulatory Visit | Attending: Internal Medicine | Admitting: Internal Medicine

## 2015-03-13 ENCOUNTER — Encounter (HOSPITAL_COMMUNITY): Payer: Self-pay | Admitting: *Deleted

## 2015-03-13 DIAGNOSIS — Z885 Allergy status to narcotic agent status: Secondary | ICD-10-CM

## 2015-03-13 DIAGNOSIS — Z89512 Acquired absence of left leg below knee: Secondary | ICD-10-CM

## 2015-03-13 DIAGNOSIS — E10628 Type 1 diabetes mellitus with other skin complications: Secondary | ICD-10-CM | POA: Diagnosis present

## 2015-03-13 DIAGNOSIS — M549 Dorsalgia, unspecified: Secondary | ICD-10-CM | POA: Diagnosis present

## 2015-03-13 DIAGNOSIS — Z8674 Personal history of sudden cardiac arrest: Secondary | ICD-10-CM | POA: Diagnosis not present

## 2015-03-13 DIAGNOSIS — R112 Nausea with vomiting, unspecified: Secondary | ICD-10-CM

## 2015-03-13 DIAGNOSIS — I120 Hypertensive chronic kidney disease with stage 5 chronic kidney disease or end stage renal disease: Secondary | ICD-10-CM | POA: Diagnosis present

## 2015-03-13 DIAGNOSIS — R9431 Abnormal electrocardiogram [ECG] [EKG]: Secondary | ICD-10-CM

## 2015-03-13 DIAGNOSIS — I4581 Long QT syndrome: Secondary | ICD-10-CM | POA: Diagnosis present

## 2015-03-13 DIAGNOSIS — I70261 Atherosclerosis of native arteries of extremities with gangrene, right leg: Secondary | ICD-10-CM | POA: Diagnosis present

## 2015-03-13 DIAGNOSIS — E1152 Type 2 diabetes mellitus with diabetic peripheral angiopathy with gangrene: Secondary | ICD-10-CM

## 2015-03-13 DIAGNOSIS — Z79899 Other long term (current) drug therapy: Secondary | ICD-10-CM | POA: Diagnosis not present

## 2015-03-13 DIAGNOSIS — Z823 Family history of stroke: Secondary | ICD-10-CM | POA: Diagnosis not present

## 2015-03-13 DIAGNOSIS — G8929 Other chronic pain: Secondary | ICD-10-CM | POA: Diagnosis present

## 2015-03-13 DIAGNOSIS — Z880 Allergy status to penicillin: Secondary | ICD-10-CM | POA: Diagnosis not present

## 2015-03-13 DIAGNOSIS — Z794 Long term (current) use of insulin: Secondary | ICD-10-CM | POA: Diagnosis not present

## 2015-03-13 DIAGNOSIS — E0852 Diabetes mellitus due to underlying condition with diabetic peripheral angiopathy with gangrene: Secondary | ICD-10-CM | POA: Diagnosis not present

## 2015-03-13 DIAGNOSIS — D649 Anemia, unspecified: Secondary | ICD-10-CM | POA: Diagnosis present

## 2015-03-13 DIAGNOSIS — Z89112 Acquired absence of left hand: Secondary | ICD-10-CM

## 2015-03-13 DIAGNOSIS — Z992 Dependence on renal dialysis: Secondary | ICD-10-CM

## 2015-03-13 DIAGNOSIS — I7092 Chronic total occlusion of artery of the extremities: Secondary | ICD-10-CM | POA: Diagnosis not present

## 2015-03-13 DIAGNOSIS — Z833 Family history of diabetes mellitus: Secondary | ICD-10-CM

## 2015-03-13 DIAGNOSIS — I10 Essential (primary) hypertension: Secondary | ICD-10-CM | POA: Diagnosis present

## 2015-03-13 DIAGNOSIS — Z881 Allergy status to other antibiotic agents status: Secondary | ICD-10-CM | POA: Diagnosis not present

## 2015-03-13 DIAGNOSIS — N186 End stage renal disease: Secondary | ICD-10-CM | POA: Diagnosis present

## 2015-03-13 DIAGNOSIS — F329 Major depressive disorder, single episode, unspecified: Secondary | ICD-10-CM | POA: Diagnosis present

## 2015-03-13 DIAGNOSIS — E1165 Type 2 diabetes mellitus with hyperglycemia: Secondary | ICD-10-CM

## 2015-03-13 DIAGNOSIS — Z8673 Personal history of transient ischemic attack (TIA), and cerebral infarction without residual deficits: Secondary | ICD-10-CM | POA: Diagnosis not present

## 2015-03-13 DIAGNOSIS — E1052 Type 1 diabetes mellitus with diabetic peripheral angiopathy with gangrene: Principal | ICD-10-CM | POA: Diagnosis present

## 2015-03-13 DIAGNOSIS — E1065 Type 1 diabetes mellitus with hyperglycemia: Secondary | ICD-10-CM | POA: Diagnosis present

## 2015-03-13 DIAGNOSIS — E1022 Type 1 diabetes mellitus with diabetic chronic kidney disease: Secondary | ICD-10-CM | POA: Diagnosis present

## 2015-03-13 DIAGNOSIS — F32A Depression, unspecified: Secondary | ICD-10-CM

## 2015-03-13 DIAGNOSIS — I96 Gangrene, not elsewhere classified: Secondary | ICD-10-CM | POA: Diagnosis not present

## 2015-03-13 DIAGNOSIS — Z89411 Acquired absence of right great toe: Secondary | ICD-10-CM

## 2015-03-13 DIAGNOSIS — IMO0002 Reserved for concepts with insufficient information to code with codable children: Secondary | ICD-10-CM | POA: Diagnosis present

## 2015-03-13 HISTORY — DX: Cardiac arrest, cause unspecified: I46.9

## 2015-03-13 HISTORY — PX: AMPUTATION: SHX166

## 2015-03-13 LAB — GLUCOSE, CAPILLARY
GLUCOSE-CAPILLARY: 231 mg/dL — AB (ref 65–99)
GLUCOSE-CAPILLARY: 211 mg/dL — AB (ref 65–99)
Glucose-Capillary: 202 mg/dL — ABNORMAL HIGH (ref 65–99)
Glucose-Capillary: 137 mg/dL — ABNORMAL HIGH (ref 65–99)

## 2015-03-13 LAB — POCT I-STAT 4, (NA,K, GLUC, HGB,HCT)
GLUCOSE: 196 mg/dL — AB (ref 65–99)
HEMATOCRIT: 34 % — AB (ref 36.0–46.0)
Hemoglobin: 11.6 g/dL — ABNORMAL LOW (ref 12.0–15.0)
POTASSIUM: 3.6 mmol/L (ref 3.5–5.1)
Sodium: 136 mmol/L (ref 135–145)

## 2015-03-13 LAB — TYPE AND SCREEN
ABO/RH(D): A POS
Antibody Screen: NEGATIVE

## 2015-03-13 LAB — CBC
HCT: 33.5 % — ABNORMAL LOW (ref 36.0–46.0)
HEMOGLOBIN: 10.7 g/dL — AB (ref 12.0–15.0)
MCH: 30.4 pg (ref 26.0–34.0)
MCHC: 31.9 g/dL (ref 30.0–36.0)
MCV: 95.2 fL (ref 78.0–100.0)
Platelets: 177 10*3/uL (ref 150–400)
RBC: 3.52 MIL/uL — ABNORMAL LOW (ref 3.87–5.11)
RDW: 15.3 % (ref 11.5–15.5)
WBC: 8.7 10*3/uL (ref 4.0–10.5)

## 2015-03-13 LAB — CREATININE, SERUM
CREATININE: 5.23 mg/dL — AB (ref 0.44–1.00)
GFR, EST AFRICAN AMERICAN: 10 mL/min — AB (ref >60)
GFR, EST NON AFRICAN AMERICAN: 9 mL/min — AB (ref >60)

## 2015-03-13 SURGERY — AMPUTATION, ABOVE KNEE
Anesthesia: General | Site: Leg Upper | Laterality: Right

## 2015-03-13 MED ORDER — MECLIZINE HCL 25 MG PO TABS: 25.0000 mg | ORAL_TABLET | Freq: Three times a day (TID) | ORAL | Status: DC | PRN

## 2015-03-13 MED ORDER — PHENOL 1.4 % MT LIQD: 1.0000 | OROMUCOSAL | Status: DC | PRN

## 2015-03-13 MED ORDER — METOPROLOL TARTRATE 1 MG/ML IV SOLN: 2.0000 mg | INTRAVENOUS | Status: DC | PRN

## 2015-03-13 MED ORDER — LIDOCAINE HCL (CARDIAC) 20 MG/ML IV SOLN
INTRAVENOUS | Status: AC
  Filled 2015-03-13: qty 5

## 2015-03-13 MED ORDER — LIDOCAINE HCL (CARDIAC) 20 MG/ML IV SOLN
INTRAVENOUS | Status: DC | PRN
  Administered 2015-03-13: 40 mg via INTRAVENOUS

## 2015-03-13 MED ORDER — DOXERCALCIFEROL 4 MCG/2ML IV SOLN: 4.0000 ug | INTRAVENOUS | Status: DC

## 2015-03-13 MED ORDER — GUAIFENESIN-DM 100-10 MG/5ML PO SYRP: 15.0000 mL | ORAL_SOLUTION | ORAL | Status: DC | PRN

## 2015-03-13 MED ORDER — MELATONIN 5 MG PO TABS: 5.0000 mg | ORAL_TABLET | Freq: Every day | ORAL | Status: DC

## 2015-03-13 MED ORDER — RENA-VITE PO TABS
1.0000 | ORAL_TABLET | Freq: Every day | ORAL | Status: DC
  Administered 2015-03-13 – 2015-03-16 (×4): 1 via ORAL
  Filled 2015-03-13 (×4): qty 1

## 2015-03-13 MED ORDER — HEPARIN SODIUM (PORCINE) 5000 UNIT/ML IJ SOLN
5000.0000 [IU] | Freq: Three times a day (TID) | INTRAMUSCULAR | Status: DC
  Filled 2015-03-13: qty 1

## 2015-03-13 MED ORDER — HYDROMORPHONE HCL 1 MG/ML IJ SOLN
0.2500 mg | INTRAMUSCULAR | Status: DC | PRN
  Administered 2015-03-13 (×3): 0.5 mg via INTRAVENOUS

## 2015-03-13 MED ORDER — DOXERCALCIFEROL 4 MCG/2ML IV SOLN
4.0000 ug | INTRAVENOUS | Status: AC
  Administered 2015-03-14: 4 ug via INTRAVENOUS
  Filled 2015-03-13: qty 2

## 2015-03-13 MED ORDER — LORATADINE 10 MG PO TABS
10.0000 mg | ORAL_TABLET | Freq: Every day | ORAL | Status: DC
  Administered 2015-03-13 – 2015-03-17 (×4): 10 mg via ORAL
  Filled 2015-03-13 (×4): qty 1

## 2015-03-13 MED ORDER — ONDANSETRON HCL 4 MG/2ML IJ SOLN
4.0000 mg | Freq: Four times a day (QID) | INTRAMUSCULAR | Status: DC | PRN
  Administered 2015-03-14 – 2015-03-16 (×5): 4 mg via INTRAVENOUS
  Filled 2015-03-13 (×6): qty 2

## 2015-03-13 MED ORDER — METOPROLOL TARTRATE 12.5 MG HALF TABLET
12.5000 mg | ORAL_TABLET | ORAL | Status: DC
  Administered 2015-03-14 – 2015-03-17 (×4): 12.5 mg via ORAL
  Filled 2015-03-13 (×4): qty 1

## 2015-03-13 MED ORDER — LANTHANUM CARBONATE 500 MG PO CHEW
2000.0000 mg | CHEWABLE_TABLET | Freq: Three times a day (TID) | ORAL | Status: DC
  Administered 2015-03-13 – 2015-03-17 (×7): 2000 mg via ORAL
  Filled 2015-03-13 (×7): qty 4

## 2015-03-13 MED ORDER — LABETALOL HCL 5 MG/ML IV SOLN: 10.0000 mg | INTRAVENOUS | Status: DC | PRN

## 2015-03-13 MED ORDER — VANCOMYCIN HCL IN DEXTROSE 1-5 GM/200ML-% IV SOLN: 1000.0000 mg | Freq: Two times a day (BID) | INTRAVENOUS | Status: DC

## 2015-03-13 MED ORDER — INSULIN DETEMIR 100 UNIT/ML ~~LOC~~ SOLN
18.0000 [IU] | Freq: Every day | SUBCUTANEOUS | Status: DC
  Administered 2015-03-13 – 2015-03-16 (×4): 18 [IU] via SUBCUTANEOUS
  Filled 2015-03-13 (×5): qty 0.18

## 2015-03-13 MED ORDER — HYDRALAZINE HCL 20 MG/ML IJ SOLN: 5.0000 mg | INTRAMUSCULAR | Status: DC | PRN

## 2015-03-13 MED ORDER — ACETAMINOPHEN 325 MG PO TABS: 650.0000 mg | ORAL_TABLET | Freq: Four times a day (QID) | ORAL | Status: DC | PRN

## 2015-03-13 MED ORDER — CETYLPYRIDINIUM CHLORIDE 0.05 % MT LIQD
7.0000 mL | Freq: Two times a day (BID) | OROMUCOSAL | Status: DC
  Administered 2015-03-15 – 2015-03-16 (×2): 7 mL via OROMUCOSAL

## 2015-03-13 MED ORDER — PROPOFOL 10 MG/ML IV BOLUS
INTRAVENOUS | Status: DC | PRN
  Administered 2015-03-13: 200 mg via INTRAVENOUS
  Administered 2015-03-13 (×2): 50 mg via INTRAVENOUS

## 2015-03-13 MED ORDER — DOXERCALCIFEROL 4 MCG/2ML IV SOLN
4.0000 ug | INTRAVENOUS | Status: DC
  Administered 2015-03-16: 4 ug via INTRAVENOUS
  Filled 2015-03-13: qty 2

## 2015-03-13 MED ORDER — ONDANSETRON HCL 4 MG/2ML IJ SOLN
INTRAMUSCULAR | Status: DC | PRN
  Administered 2015-03-13: 4 mg via INTRAVENOUS

## 2015-03-13 MED ORDER — POTASSIUM CHLORIDE CRYS ER 20 MEQ PO TBCR: 20.0000 meq | EXTENDED_RELEASE_TABLET | Freq: Every day | ORAL | Status: DC | PRN

## 2015-03-13 MED ORDER — HYDROCODONE-ACETAMINOPHEN 5-325 MG PO TABS
1.0000 | ORAL_TABLET | ORAL | Status: DC | PRN
  Administered 2015-03-13 – 2015-03-15 (×10): 2 via ORAL
  Administered 2015-03-17: 1 via ORAL
  Filled 2015-03-13 (×7): qty 2
  Filled 2015-03-13: qty 1
  Filled 2015-03-13: qty 2
  Filled 2015-03-13: qty 1
  Filled 2015-03-13: qty 2
  Filled 2015-03-13: qty 1

## 2015-03-13 MED ORDER — INSULIN ASPART 100 UNIT/ML ~~LOC~~ SOLN
0.0000 [IU] | SUBCUTANEOUS | Status: DC
  Administered 2015-03-13: 4 [IU] via SUBCUTANEOUS

## 2015-03-13 MED ORDER — HYDROMORPHONE HCL 1 MG/ML IJ SOLN
INTRAMUSCULAR | Status: AC
  Filled 2015-03-13: qty 2

## 2015-03-13 MED ORDER — MORPHINE SULFATE (PF) 2 MG/ML IV SOLN: 2.0000 mg | INTRAVENOUS | Status: DC | PRN

## 2015-03-13 MED ORDER — ACETAMINOPHEN 325 MG RE SUPP: 325.0000 mg | RECTAL | Status: DC | PRN

## 2015-03-13 MED ORDER — SODIUM CHLORIDE 0.9 % IJ SOLN
3.0000 mL | Freq: Two times a day (BID) | INTRAMUSCULAR | Status: DC
  Administered 2015-03-14 – 2015-03-15 (×3): 3 mL via INTRAVENOUS

## 2015-03-13 MED ORDER — SODIUM CHLORIDE 0.9 % IV SOLN
INTRAVENOUS | Status: DC
  Administered 2015-03-16: 17:00:00 via INTRAVENOUS

## 2015-03-13 MED ORDER — HYDROMORPHONE HCL 1 MG/ML IJ SOLN: 0.5000 mg | INTRAMUSCULAR | Status: DC | PRN

## 2015-03-13 MED ORDER — SODIUM CHLORIDE 0.9 % IJ SOLN
INTRAMUSCULAR | Status: AC
  Filled 2015-03-13: qty 10

## 2015-03-13 MED ORDER — HYDROMORPHONE HCL 1 MG/ML IJ SOLN
0.5000 mg | INTRAMUSCULAR | Status: DC | PRN
  Administered 2015-03-13 – 2015-03-14 (×9): 1 mg via INTRAVENOUS
  Administered 2015-03-15 (×2): 0.5 mg via INTRAVENOUS
  Administered 2015-03-15: 1 mg via INTRAVENOUS
  Administered 2015-03-15: 0.5 mg via INTRAVENOUS
  Administered 2015-03-15: 1 mg via INTRAVENOUS
  Administered 2015-03-16: 0.5 mg via INTRAVENOUS
  Filled 2015-03-13 (×13): qty 1

## 2015-03-13 MED ORDER — MAGNESIUM SULFATE 2 GM/50ML IV SOLN
2.0000 g | Freq: Every day | INTRAVENOUS | Status: DC | PRN
  Filled 2015-03-13: qty 50

## 2015-03-13 MED ORDER — PROPOFOL 10 MG/ML IV BOLUS
INTRAVENOUS | Status: AC
  Filled 2015-03-13: qty 20

## 2015-03-13 MED ORDER — VENLAFAXINE HCL ER 75 MG PO CP24
225.0000 mg | ORAL_CAPSULE | Freq: Every day | ORAL | Status: DC
  Administered 2015-03-13 – 2015-03-17 (×3): 225 mg via ORAL
  Filled 2015-03-13 (×6): qty 1

## 2015-03-13 MED ORDER — ONDANSETRON HCL 4 MG/2ML IJ SOLN: 4.0000 mg | Freq: Four times a day (QID) | INTRAMUSCULAR | Status: DC | PRN

## 2015-03-13 MED ORDER — HYDROCODONE-ACETAMINOPHEN 7.5-325 MG PO TABS: 1.0000 | ORAL_TABLET | Freq: Once | ORAL | Status: DC | PRN

## 2015-03-13 MED ORDER — ONDANSETRON HCL 4 MG PO TABS
4.0000 mg | ORAL_TABLET | Freq: Four times a day (QID) | ORAL | Status: DC | PRN
  Filled 2015-03-13: qty 1

## 2015-03-13 MED ORDER — DARBEPOETIN ALFA 60 MCG/0.3ML IJ SOSY
60.0000 ug | PREFILLED_SYRINGE | INTRAMUSCULAR | Status: AC
  Administered 2015-03-14: 60 ug via INTRAVENOUS
  Filled 2015-03-13: qty 0.3

## 2015-03-13 MED ORDER — ALUM & MAG HYDROXIDE-SIMETH 200-200-20 MG/5ML PO SUSP: 15.0000 mL | ORAL | Status: DC | PRN

## 2015-03-13 MED ORDER — TAB-A-VITE/IRON PO TABS: 1.0000 | ORAL_TABLET | Freq: Every day | ORAL | Status: DC

## 2015-03-13 MED ORDER — PROMETHAZINE HCL 25 MG/ML IJ SOLN: 6.2500 mg | INTRAMUSCULAR | Status: DC | PRN

## 2015-03-13 MED ORDER — ACETAMINOPHEN 650 MG RE SUPP: 650.0000 mg | Freq: Four times a day (QID) | RECTAL | Status: DC | PRN

## 2015-03-13 MED ORDER — DOCUSATE SODIUM 100 MG PO CAPS
100.0000 mg | ORAL_CAPSULE | Freq: Every day | ORAL | Status: DC
  Administered 2015-03-14 – 2015-03-17 (×3): 100 mg via ORAL
  Filled 2015-03-13 (×3): qty 1

## 2015-03-13 MED ORDER — PHENYLEPHRINE HCL 10 MG/ML IJ SOLN
INTRAMUSCULAR | Status: DC | PRN
  Administered 2015-03-13: 40 ug via INTRAVENOUS

## 2015-03-13 MED ORDER — EPHEDRINE SULFATE 50 MG/ML IJ SOLN
INTRAMUSCULAR | Status: AC
  Filled 2015-03-13: qty 1

## 2015-03-13 MED ORDER — HEPARIN SODIUM (PORCINE) 5000 UNIT/ML IJ SOLN
5000.0000 [IU] | Freq: Three times a day (TID) | INTRAMUSCULAR | Status: DC
  Administered 2015-03-13 – 2015-03-17 (×11): 5000 [IU] via SUBCUTANEOUS
  Filled 2015-03-13 (×11): qty 1

## 2015-03-13 MED ORDER — PANTOPRAZOLE SODIUM 40 MG PO TBEC
40.0000 mg | DELAYED_RELEASE_TABLET | Freq: Two times a day (BID) | ORAL | Status: DC
  Administered 2015-03-13 – 2015-03-17 (×7): 40 mg via ORAL
  Filled 2015-03-13 (×7): qty 1

## 2015-03-13 MED ORDER — PHENYLEPHRINE 40 MCG/ML (10ML) SYRINGE FOR IV PUSH (FOR BLOOD PRESSURE SUPPORT)
PREFILLED_SYRINGE | INTRAVENOUS | Status: AC
  Filled 2015-03-13: qty 10

## 2015-03-13 MED ORDER — ACETAMINOPHEN 325 MG PO TABS: 325.0000 mg | ORAL_TABLET | ORAL | Status: DC | PRN

## 2015-03-13 MED ORDER — ENSURE ENLIVE PO LIQD
237.0000 mL | Freq: Every evening | ORAL | Status: DC
  Administered 2015-03-14 – 2015-03-15 (×2): 237 mL via ORAL
  Filled 2015-03-13: qty 237

## 2015-03-13 MED ORDER — 0.9 % SODIUM CHLORIDE (POUR BTL) OPTIME
TOPICAL | Status: DC | PRN
  Administered 2015-03-13: 1000 mL

## 2015-03-13 MED ORDER — FENTANYL CITRATE (PF) 250 MCG/5ML IJ SOLN
INTRAMUSCULAR | Status: DC | PRN
  Administered 2015-03-13: 50 ug via INTRAVENOUS
  Administered 2015-03-13 (×2): 25 ug via INTRAVENOUS
  Administered 2015-03-13 (×2): 50 ug via INTRAVENOUS

## 2015-03-13 MED ORDER — FENTANYL CITRATE (PF) 250 MCG/5ML IJ SOLN
INTRAMUSCULAR | Status: AC
  Filled 2015-03-13: qty 5

## 2015-03-13 MED ORDER — SUCCINYLCHOLINE CHLORIDE 20 MG/ML IJ SOLN
INTRAMUSCULAR | Status: AC
  Filled 2015-03-13: qty 1

## 2015-03-13 SURGICAL SUPPLY — 48 items
BANDAGE ELASTIC 6 VELCRO ST LF (GAUZE/BANDAGES/DRESSINGS) ×3 IMPLANT
BANDAGE ESMARK 6X9 LF (GAUZE/BANDAGES/DRESSINGS) IMPLANT
BLADE SAW RECIP 87.9 MT (BLADE) ×3 IMPLANT
BNDG COHESIVE 6X5 TAN STRL LF (GAUZE/BANDAGES/DRESSINGS) ×3 IMPLANT
BNDG ESMARK 6X9 LF (GAUZE/BANDAGES/DRESSINGS)
BNDG GAUZE ELAST 4 BULKY (GAUZE/BANDAGES/DRESSINGS) ×3 IMPLANT
CANISTER SUCTION 2500CC (MISCELLANEOUS) ×3 IMPLANT
CLIP TI MEDIUM 6 (CLIP) ×3 IMPLANT
COVER SURGICAL LIGHT HANDLE (MISCELLANEOUS) ×3 IMPLANT
CUFF TOURNIQUET SINGLE 24IN (TOURNIQUET CUFF) IMPLANT
CUFF TOURNIQUET SINGLE 34IN LL (TOURNIQUET CUFF) IMPLANT
CUFF TOURNIQUET SINGLE 44IN (TOURNIQUET CUFF) IMPLANT
DRAPE ORTHO SPLIT 77X108 STRL (DRAPES) ×4
DRAPE PROXIMA HALF (DRAPES) IMPLANT
DRAPE SURG ORHT 6 SPLT 77X108 (DRAPES) ×2 IMPLANT
DRSG ADAPTIC 3X8 NADH LF (GAUZE/BANDAGES/DRESSINGS) ×3 IMPLANT
DRSG PAD ABDOMINAL 8X10 ST (GAUZE/BANDAGES/DRESSINGS) ×3 IMPLANT
ELECT REM PT RETURN 9FT ADLT (ELECTROSURGICAL) ×3
ELECTRODE REM PT RTRN 9FT ADLT (ELECTROSURGICAL) ×1 IMPLANT
EVACUATOR 1/8 PVC DRAIN (DRAIN) ×3 IMPLANT
GAUZE SPONGE 4X4 12PLY STRL (GAUZE/BANDAGES/DRESSINGS) ×6 IMPLANT
GLOVE BIOGEL M 6.5 STRL (GLOVE) ×6 IMPLANT
GLOVE BIOGEL PI IND STRL 6.5 (GLOVE) ×3 IMPLANT
GLOVE BIOGEL PI INDICATOR 6.5 (GLOVE) ×6
GLOVE ECLIPSE 6.5 STRL STRAW (GLOVE) ×3 IMPLANT
GLOVE SS BIOGEL STRL SZ 7 (GLOVE) ×1 IMPLANT
GLOVE SUPERSENSE BIOGEL SZ 7 (GLOVE) ×2
GOWN STRL REUS W/ TWL LRG LVL3 (GOWN DISPOSABLE) ×3 IMPLANT
GOWN STRL REUS W/TWL LRG LVL3 (GOWN DISPOSABLE) ×6
KIT BASIN OR (CUSTOM PROCEDURE TRAY) ×3 IMPLANT
KIT ROOM TURNOVER OR (KITS) ×3 IMPLANT
NS IRRIG 1000ML POUR BTL (IV SOLUTION) ×3 IMPLANT
PACK GENERAL/GYN (CUSTOM PROCEDURE TRAY) ×3 IMPLANT
PAD ARMBOARD 7.5X6 YLW CONV (MISCELLANEOUS) ×6 IMPLANT
PADDING CAST COTTON 6X4 STRL (CAST SUPPLIES) IMPLANT
SPONGE GAUZE 4X4 12PLY STER LF (GAUZE/BANDAGES/DRESSINGS) ×3 IMPLANT
STAPLER VISISTAT 35W (STAPLE) ×3 IMPLANT
STOCKINETTE IMPERVIOUS LG (DRAPES) ×3 IMPLANT
SUT SILK 2 0 (SUTURE) ×2
SUT SILK 2 0 SH CR/8 (SUTURE) ×3 IMPLANT
SUT SILK 2-0 18XBRD TIE 12 (SUTURE) ×1 IMPLANT
SUT SILK 3 0 (SUTURE) ×2
SUT SILK 3-0 18XBRD TIE 12 (SUTURE) ×1 IMPLANT
SUT VIC AB 2-0 CT1 18 (SUTURE) ×3 IMPLANT
SUT VIC AB 2-0 CT1 27 (SUTURE) ×4
SUT VIC AB 2-0 CT1 TAPERPNT 27 (SUTURE) ×2 IMPLANT
UNDERPAD 30X30 INCONTINENT (UNDERPADS AND DIAPERS) ×3 IMPLANT
WATER STERILE IRR 1000ML POUR (IV SOLUTION) ×3 IMPLANT

## 2015-03-13 NOTE — Transfer of Care (Signed)
Immediate Anesthesia Transfer of Care Note  Patient: Gaetano NetPatricia A Ludden  Procedure(s) Performed: Procedure(s): RIGHT  ABOVE KNEE AMPUTATION (Right)  Patient Location: PACU  Anesthesia Type:General  Level of Consciousness: awake, alert , oriented and patient cooperative  Airway & Oxygen Therapy: Patient Spontanous Breathing and Patient connected to nasal cannula oxygen  Post-op Assessment: Report given to RN, Post -op Vital signs reviewed and stable and Patient moving all extremities  Post vital signs: Reviewed and stable  Last Vitals:  Filed Vitals:   03/13/15 0653  BP: 71/51  Pulse: 82  Temp: 36.8 C  Resp: 16    Complications: No apparent anesthesia complications

## 2015-03-13 NOTE — Progress Notes (Signed)
Patient lying in bed sleeping. No distress, pain medication given. Call light within reach.

## 2015-03-13 NOTE — H&P (Signed)
[facility] History and Physical  Bonnie Netatricia A Rawe WUJ:[id number] DOB: January 20, 1961 DOA: 03/13/2015  Referring physician: Emergency Department PCP: Irena CordsOLADONATO,JOSEPH A, MD   CHIEF COMPLAINT: management of multiple medical problems.     HPI: [name] is a 54 y.o. female  with multiple medical problems including end-stage renal disease on hemodialysis Monday, Wednesday and Friday , patient has peripheral artery disease and is status post multiple digit amputations and left BKA.. Patient is status post right AKA a.m. We were asked to admit the patient for management of multiple medical problems.  Labs:   Glucose 196, hemoglobin 11.6  EKG:    Normal sinus rhythm ST & T wave abnormality, consider anterolateral ischemia Prolonged QT Abnormal ECG QT 531   Medications  Chlorhexidine Gluconate Cloth 2 % PADS 6 each (not administered)  0.9 %  sodium chloride infusion ( Intravenous New Bag/Given 03/13/15 0711)  vancomycin (VANCOCIN) IVPB 1000 mg/200 mL premix (1,000 mg Intravenous Given 03/13/15 0747)    Review of Systems  Constitutional: Negative.   HENT: Negative.   Eyes: Negative.   Respiratory: Negative.   Cardiovascular: Negative.   Gastrointestinal: Negative.   Genitourinary: Negative.   Musculoskeletal: Negative.   Skin: Negative.   Neurological: Negative.   Endo/Heme/Allergies: Negative.   Psychiatric/Behavioral: Negative.     Past Medical History  Diagnosis Date  . Hypertension   . Anemia   . Diabetes mellitus   . Peripheral vascular disease (HCC)   . Hemodialysis patient (HCC) 03/15/11    "[facility]"  . Blood transfusion   . Chronic back pain   . Rash 03/15/11    "admitted me to find out what its' from; UE, waist, thighs, groin"  . Renal failure     hd 4/12  . Stroke [facility](HCC)    Past Surgical History  Procedure Laterality Date  . Insertion of dialysis catheter  07/29/10    right subclavian  . Arteriovenous  graft placement  10/01/10    left upper arm  . Below knee leg amputation  2004    left  . Amputation  03/04/2011    Procedure: AMPUTATION DIGIT;  Surgeon: Nadara MustardMarcus V Duda, MD;  [facility];  Service: Orthopedics;  Laterality: Right;  RT GREAT TOE AMP  . Cataract extraction w/ intraocular lens implant  12/2009    right eye  . Carpal tunnel release  ~ 2009    left wrist  . Tee without cardioversion  03/18/2011    Procedure: TRANSESOPHAGEAL ECHOCARDIOGRAM (TEE);  Surgeon: Marca Anconaalton McLean, MD;  [facility];  Service: Cardiovascular;  Laterality: N/A;  . Hand amputation Left     SOCIAL HISTORY:  reports that she has never smoked. She has never used smokeless tobacco. She reports that she does not drink alcohol or use illicit drugs. Lives:  In Nursing Home  Assistive devices:   Was able to transfer to chair prior to this second leg amputation today.   Allergies  Allergen Reactions  . Codeine Hives  . Levofloxacin Other (See Comments)    unknown  . Penicillins Hives    Family History  Problem Relation Age of Onset  . Diabetes Mellitus II Mother   . Stroke Mother     Prior to Admission medications   Medication Sig Start Date End Date Taking? Authorizing Provider  HYDROcodone-acetaminophen (NORCO/VICODIN) 5-325 MG per tablet Take 1-2 tablets by mouth every 6 (six) hours as needed for moderate pain. 12/12/14  Yes Catarina Hartshornavid Tat, MD  insulin aspart (NOVOLOG) 100 UNIT/ML injection Inject 0-10 Units into the skin 3 (three) times a week. Mondays, Wednesdays and Fridays   Yes Historical Provider, MD  insulin detemir (LEVEMIR) 100 UNIT/ML injection Inject 18 Units into the skin at bedtime.    Yes Historical Provider, MD  lanthanum (FOSRENOL) 1000 MG chewable tablet Chew 1 tablet (1,000 mg total) by mouth 3 (three) times daily with meals. Patient taking differently: Chew 2,000 mg by mouth 3 (three) times daily with meals.  03/21/12  Yes Elenora Gamma, MD  loratadine (CLARITIN) 10 MG tablet  Take 10 mg by mouth daily.   Yes Historical Provider, MD  Melatonin 5 MG TABS Take 5 mg by mouth at bedtime.   Yes Historical Provider, MD  metoCLOPramide (REGLAN) 5 MG tablet Take 5 mg by mouth 4 (four) times daily.   Yes Historical Provider, MD  metoprolol tartrate (LOPRESSOR) 25 MG tablet Take 12.5 mg by mouth See admin instructions. 12.5mg  twice daily on Sunday, Tuesday, Thursday, Saturday   Yes Historical Provider, MD  Multiple Vitamins-Iron (MULTIVITAMINS WITH IRON) TABS tablet Take 1 tablet by mouth daily.   Yes Historical Provider, MD  Nutritional Supplements (ENSURE CLEAR PO) Take 1 Can by mouth every evening.   Yes Historical Provider, MD  pantoprazole (PROTONIX) 40 MG tablet Take 40 mg by mouth every 12 (twelve) hours.    Yes Historical Provider, MD  traMADol (ULTRAM) 50 MG tablet Take 50 mg by mouth every 12 (twelve) hours as needed for moderate pain.   Yes Historical Provider, MD  venlafaxine XR (EFFEXOR-XR) 75 MG 24 hr capsule Take 225 mg by mouth daily with breakfast.   Yes Historical Provider, MD  collagenase (SANTYL) ointment Apply topically daily. To eschar on R-foot and cover with moist dressing and wrap with Kerlix.  Change once daily 12/12/14   Catarina Hartshorn, MD  guaiFENesin-codeine [facility]) 100-10 MG/5ML syrup Take 30 mLs by mouth every 6 (six) hours as needed for cough.    Historical Provider, MD  meclizine (ANTIVERT) 25 MG tablet Take 25 mg by mouth 3 (three) times daily as needed for dizziness.    Historical Provider, MD   PHYSICAL EXAM: Filed Vitals:   03/13/15 0643 03/13/15 0653  BP:  71/51  Pulse:  82  Temp:  98.3 F (36.8 C)  TempSrc:  Oral  Resp:  16  Weight: 76.658 kg (169 lb)   SpO2:  100%    Wt Readings from Last 3 Encounters:  03/13/15 76.658 kg (169 lb)  03/10/15 76.658 kg (169 lb)  12/12/14 77 kg (169 lb 12.1 oz)    General:  Pleasant  white female. Appears calm and comfortable Eyes: PER, normal lids, irises & conjunctiva ENT: grossly normal  hearing, lips & tongue Neck: no LAD, no masses Cardiovascular: RRR, no murmurs.  Respiratory: Respirations even and unlabored. Normal respiratory effort. Lungs CTA bilaterally, no wheezes / rales .   Abdomen: soft, non-distended, non-tender, active bowel sounds. No obvious masses.  Skin: no rash seen on limited exam Musculoskeletal: grossly normal tone BUE/BLE Extremities: partial LUE amputation. Two digits amputated from RUE. Left BKA. Dressing  On RLE Psychiatric: grossly normal mood and affect, speech fluent and appropriate Neurologic: grossly non-focal.         LABS ON ADMISSION:    Basic Metabolic Panel:  Recent Labs Lab 03/13/15 0657  NA 136  K 3.6  GLUCOSE 196*   CBC:  Recent Labs Lab 03/13/15 0657  HGB 11.6*  HCT 34.0*  ASSESSMENT / PLAN   PAD with multiple amputations. Patient is s/p right AKA amputation this am for gangrenous foot. - admit to Telemetry -Vascular Surgery will follow   Uncontrolled diabetes mellitus (HCC). On sliding scale insulin outpatient. Hemoglobin A1c 7.1 late August -Monitor CBGs -Sliding-scale insulin for now.  ESRD (end stage renal disease) on dialysis (HCC). Dialysis on MWF. -Will notify Nephrology of patient's admission.   Prolonged QT interval -531.  -avoid QT prolonging medications. Hold Reglan -Telemetry -If needs anti-emetic would consider Zofran over Phenergan or Reglan.   Hypertension.  Patient had a hypotensive episode this a.m. prior to surgery (SBP in 70s).  Hypotension resolved,most recent BP 131/ 70   Depression.  Continue home Effexor  CONSULTANTS:   Vascular Surgery  Code Status: Full code DVT Prophylaxis: Heparin per Vascular Family Communication:   Patient alert, oriented and understands plan of care.  Disposition Plan: Discharge back to Nursing home in 2-3 days   Time spent: 60 minutes Willette Cluster  NP [facility] Pager [phone]

## 2015-03-13 NOTE — Progress Notes (Signed)
Pharmacy: Vancomycin Post-op for Surgical Prophylaxis  OBJECTIVE:  Wt: 76.7 kg ESRD - MWF  ASSESSMENT:  [age] YOF with ESRD-MWF who presented on 11/18 for planned R-AKA. Renal is being notified of the patient's arrival but has yet to see. The patient received a pre-op dose of Vancomycin 1g @ 0747 this morning. The patient will need post-op coverage until 11/19 @ 2000. The patient will not need any additional doses unless the patient dialyzes prior to this time. Will hold post-op Vancomycin doses for now but will monitor peripherally to see if the patient goes to HD and needs a maintenance dose during this time period.   PLAN:  1. Hold post-op Vancomycin doses due to ESRD 2. If the patient receives HD prior to 11/19@ 2000 - will plan to give a maintenance dose  Georgina PillionElizabeth Curtina Grills, PharmD, BCPS Clinical Pharmacist Pager: [phone] 03/13/2015 10:35 AM

## 2015-03-13 NOTE — Interval H&P Note (Signed)
History and Physical Interval Note:  03/13/2015 7:25 AM  Bonnie NetPatricia A [name]  has presented today for surgery, with the diagnosis of Gangrene right 5th toe I96; Peripheral arterial disease I70.92  The various methods of treatment have been discussed with the patient and family. After consideration of risks, benefits and other options for treatment, the patient has consented to  Procedure(s): BELOW KNEE VERSUS ABOVE KNEE AMPUTATION (Right) as a surgical intervention .  The patient's history has been reviewed, patient examined, no change in status, stable for surgery.  I have reviewed the patient's chart and labs.  Questions were answered to the patient's satisfaction.     Josephina GipLawson, James

## 2015-03-13 NOTE — Consult Note (Signed)
[facility] Renal Consultation Note    Indication for Consultation:  Management of ESRD/hemodialysis; anemia, hypertension/volume and secondary hyperparathyroidism PCP: [facility] Care attending  HPI: [name] is a 54 y.o. female long term  resident of [facility] with ESRD secondary to DM with severe PVC s/p multiple amputations (upper and lower extremity), hx PEA arrest with VDRF/osteo/shock with prior [facility] stay, who dialyzes MWF at  [facility].  She has had a chronic right foot infection being managed at her NH/wound center.  On exam, 10/24 at [facility] the dorsum of her foot was found to be cellulitc with a foul odor. she was started on an empiric course of Vanc and Elita Quick and referred to [facility]. She was seen in the [facility] office 11/15 and scheduled for surgery which occurred this am and required an AKA.   At present she has no SOB, CP, N, V, D, fever or chills.  She has pain in her right leg and appetite is not too good.  She would prefer to wait until Saturday for her HD treatment.  Past Medical History  Diagnosis Date  . Hypertension   . Anemia   . Diabetes mellitus   . Peripheral vascular disease (HCC)   . Hemodialysis patient (HCC) 03/15/11    "Tues; Thurs; Sat; [facility]"  . Blood transfusion   . Chronic back pain   . Rash 03/15/11    "admitted me to find out what its' from; UE, waist, thighs, groin"  . Renal failure     hd 4/12  . Stroke (HCC)   . PEA (Pulseless electrical activity) [facility])    Past Surgical History  Procedure Laterality Date  . Insertion of dialysis catheter  07/29/10    right subclavian  . Arteriovenous graft placement  10/01/10    left upper arm  . Below knee leg amputation  2004    left  . Amputation  03/04/2011    Procedure: AMPUTATION DIGIT;  Surgeon: [name], MD;  Location: [facility] OR;  Service: Orthopedics;  Laterality: Right;  RT GREAT TOE AMP  . Cataract extraction w/ intraocular lens implant  12/2009   right eye  . Carpal tunnel release  ~ 2009    left wrist  . Tee without cardioversion  03/18/2011    Procedure: TRANSESOPHAGEAL ECHOCARDIOGRAM (TEE);  Surgeon: [name], MD;  Location: [facility] ENDOSCOPY;  Service: Cardiovascular;  Laterality: N/A;  . Hand amputation Left    Family History  Problem Relation Age of Onset  . Diabetes Mellitus II Mother   . Stroke Mother    Social History:  reports that she has never smoked. She has never used smokeless tobacco. She reports that she does not drink alcohol or use illicit drugs. Allergies  Allergen Reactions  . Codeine Hives  . Levofloxacin Other (See Comments)    unknown  . Penicillins Hives   Prior to Admission medications   Medication Sig Start Date End Date Taking? Authorizing Provider  HYDROcodone-acetaminophen (NORCO/VICODIN) 5-325 MG per tablet Take 1-2 tablets by mouth every 6 (six) hours as needed for moderate pain. 12/12/14  Yes Catarina Hartshorn, MD  insulin aspart (NOVOLOG) 100 UNIT/ML injection Inject 0-10 Units into the skin 3 (three) times a week. Mondays, Wednesdays and Fridays   Yes Historical Provider, MD  insulin detemir (LEVEMIR) 100 UNIT/ML injection Inject 18 Units into the skin at bedtime.    Yes Historical Provider, MD  lanthanum (FOSRENOL) 1000 MG chewable tablet Chew 1 tablet (1,000 mg  total) by mouth 3 (three) times daily with meals. Patient taking differently: Chew 2,000 mg by mouth 3 (three) times daily with meals.  03/21/12  Yes Elenora Gamma, MD  loratadine (CLARITIN) 10 MG tablet Take 10 mg by mouth daily.   Yes Historical Provider, MD  Melatonin 5 MG TABS Take 5 mg by mouth at bedtime.   Yes Historical Provider, MD  metoCLOPramide (REGLAN) 5 MG tablet Take 5 mg by mouth 4 (four) times daily.   Yes Historical Provider, MD  metoprolol tartrate (LOPRESSOR) 25 MG tablet Take 12.5 mg by mouth See admin instructions. 12.5mg  twice daily on Sunday, Tuesday, Thursday, Saturday   Yes Historical Provider, MD  Multiple  Vitamins-Iron (MULTIVITAMINS WITH IRON) TABS tablet Take 1 tablet by mouth daily.   Yes Historical Provider, MD  Nutritional Supplements (ENSURE CLEAR PO) Take 1 Can by mouth every evening.   Yes Historical Provider, MD  pantoprazole (PROTONIX) 40 MG tablet Take 40 mg by mouth every 12 (twelve) hours.    Yes Historical Provider, MD  traMADol (ULTRAM) 50 MG tablet Take 50 mg by mouth every 12 (twelve) hours as needed for moderate pain.   Yes Historical Provider, MD  venlafaxine XR (EFFEXOR-XR) 75 MG 24 hr capsule Take 225 mg by mouth daily with breakfast.   Yes Historical Provider, MD  collagenase (SANTYL) ointment Apply topically daily. To eschar on R-foot and cover with moist dressing and wrap with Kerlix.  Change once daily 12/12/14   Catarina Hartshorn, MD  guaiFENesin-codeine [facility]) 100-10 MG/5ML syrup Take 30 mLs by mouth every 6 (six) hours as needed for cough.    Historical Provider, MD  meclizine (ANTIVERT) 25 MG tablet Take 25 mg by mouth 3 (three) times daily as needed for dizziness.    Historical Provider, MD   Current Facility-Administered Medications  Medication Dose Route Frequency Provider Last Rate Last Dose  . 0.9 %  sodium chloride infusion   Intravenous Continuous Raymond Gurney, PA-C 10 mL/hr at 03/13/15 1030    . acetaminophen (TYLENOL) tablet 325-650 mg  325-650 mg Oral Q4H PRN Raymond Gurney, PA-C       Or  . acetaminophen (TYLENOL) suppository 325-650 mg  325-650 mg Rectal Q4H PRN Raymond Gurney, PA-C      . alum & mag hydroxide-simeth (MAALOX/MYLANTA) 200-200-20 MG/5ML suspension 15-30 mL  15-30 mL Oral Q2H PRN Raymond Gurney, PA-C      . [START ON 03/14/2015] docusate sodium (COLACE) capsule 100 mg  100 mg Oral Daily Raymond Gurney, PA-C      . feeding supplement (ENSURE ENLIVE) (ENSURE ENLIVE) liquid 237 mL  237 mL Oral QPM Kimberly A Trinh, PA-C      . guaiFENesin-dextromethorphan (ROBITUSSIN DM) 100-10 MG/5ML syrup 15 mL  15 mL Oral Q4H PRN Raymond Gurney,  PA-C      . heparin injection 5,000 Units  5,000 Units Subcutaneous 3 times per day Raymond Gurney, PA-C      . hydrALAZINE (APRESOLINE) injection 5 mg  5 mg Intravenous Q20 Min PRN Raymond Gurney, PA-C      . HYDROcodone-acetaminophen (NORCO/VICODIN) 5-325 MG per tablet 1-2 tablet  1-2 tablet Oral Q4H PRN Raymond Gurney, PA-C      . HYDROmorphone (DILAUDID) injection 0.5-1 mg  0.5-1 mg Intravenous Q2H PRN Meredith Pel, NP   1 mg at 03/13/15 1053  . insulin aspart (novoLOG) injection 0-10 Units  0-10 Units Subcutaneous Once per day on Mon  Wed Fri Raymond GurneyKimberly A Trinh, PA-C      . insulin detemir (LEVEMIR) injection 18 Units  18 Units Subcutaneous QHS Raymond GurneyKimberly A Trinh, PA-C      . labetalol (NORMODYNE,TRANDATE) injection 10 mg  10 mg Intravenous Q10 min PRN Raymond GurneyKimberly A Trinh, PA-C      . lanthanum (FOSRENOL) chewable tablet 2,000 mg  2,000 mg Oral TID WC Raymond GurneyKimberly A Trinh, PA-C      . loratadine (CLARITIN) tablet 10 mg  10 mg Oral Daily Raymond GurneyKimberly A Trinh, PA-C      . magnesium sulfate IVPB 2 g 50 mL  2 g Intravenous Daily PRN Raymond GurneyKimberly A Trinh, PA-C      . meclizine (ANTIVERT) tablet 25 mg  25 mg Oral TID PRN Raymond GurneyKimberly A Trinh, PA-C      . Melatonin TABS 5 mg  5 mg Oral QHS Raymond GurneyKimberly A Trinh, PA-C      . metoprolol (LOPRESSOR) injection 2-5 mg  2-5 mg Intravenous Q2H PRN Raymond GurneyKimberly A Trinh, PA-C      . metoprolol tartrate (LOPRESSOR) tablet 12.5 mg  12.5 mg Oral See admin instructions Raymond GurneyKimberly A Trinh, PA-C      . morphine 2 MG/ML injection 2-5 mg  2-5 mg Intravenous Q1H PRN Raymond GurneyKimberly A Trinh, PA-C      . multivitamins with iron tablet 1 tablet  1 tablet Oral Daily Raymond GurneyKimberly A Trinh, PA-C      . ondansetron (ZOFRAN) tablet 4 mg  4 mg Oral Q6H PRN Meredith PelPaula M Guenther, NP       Or  . ondansetron [facility](ZOFRAN) injection 4 mg  4 mg Intravenous Q6H PRN Meredith PelPaula M Guenther, NP      . pantoprazole (PROTONIX) EC tablet 40 mg  40 mg Oral Q12H Kimberly A Trinh, PA-C      . phenol (CHLORASEPTIC) mouth spray 1 spray  1  spray Mouth/Throat PRN Raymond GurneyKimberly A Trinh, PA-C      . potassium chloride SA (K-DUR,KLOR-CON) CR tablet 20-40 mEq  20-40 mEq Oral Daily PRN Raymond GurneyKimberly A Trinh, PA-C      . sodium chloride 0.9 % injection 3 mL  3 mL Intravenous Q12H Meredith PelPaula M Guenther, NP   3 mL at 03/13/15 1030  . [START ON 03/14/2015] venlafaxine XR (EFFEXOR-XR) 24 hr capsule 225 mg  225 mg Oral Q breakfast Raymond GurneyKimberly A Trinh, PA-C       Labs: Basic Metabolic Panel:  Recent Labs Lab 03/13/15 0657 03/13/15 1135  NA 136  --   K 3.6  --   GLUCOSE 196*  --   CREATININE  --  5.23*  CBC:  Recent Labs Lab 03/13/15 0657 03/13/15 1135  WBC  --  8.7  HGB 11.6* 10.7*  HCT 34.0* 33.5*  MCV  --  95.2  PLT  --  177   CBG:  Recent Labs Lab 03/13/15 0904 03/13/15 1101  GLUCAP 231* 202*     ROS: As per HPI otherwise negative.  Physical Exam: Filed Vitals:   03/13/15 0947 03/13/15 1000 03/13/15 1036 03/13/15 1200  BP: 148/69  162/64   Pulse: 87  92   Temp:  97.9 F (36.6 C) 98.1 F (36.7 C)   TempSrc:   Oral   Resp: 17  17   Height:    5\' 4"  (1.626 m)  Weight:    76.658 kg (169 lb)  SpO2: 98%  100%      General:  Pale obese WF looks remarkable good post AKA, though winces in  pain at times. Head: Normocephalic, atraumatic, sclera non-icteric, mucus membranes are moist (eating a little of lunch) Neck: Supple. JVD not elevated. Lungs: Clear bilaterally to auscultation without wheezes, rales, or rhonchi. Breathing is unlabored. Heart: RRR with S1 S2 . Abdomen: Obese, soft, non-tender, non-distended with normoactive bowel sounds.. Lower extremities: left BKA no edema/well healed; right AKA wrapped with hemovac Neuro: Alert and oriented X 3. Moves all extremities spontaneously. Psych:  Responds to questions appropriately with a normal affect. Dialysis Access: left upper AVF + bruit  Dialysis Orders: [facility] 4 hr MWF 400/A 1.5 EDW 79 2K 2.25 Ca heparin 5100 Mircera 50 q two weeks-last 11/9 just resumed from 75 9/14   Hectorol 4 Recent labs:  Hgb 9.6 declining , ferritin 2100 25% sat Ca/P ok iPTH 217  Assessment/Plan: 1. s/p right AKA - per [facility] - to return to [facility] 2. ESRD -  MWF - ok to postpone HD until first round Saturday- no heparin; need to re-establish EDW 3. Hypertension/volume  - decrease volume on HD  4. Anemia  - continue ESA/high ferritin likely due to infection; repeat with November labs after d/c- given Aranesp 60 on 11/19 5. Metabolic bone disease -  Continue hectorol 6. Nutrition -renal/carb mod/renavites. 7. DM - per primary  Sheffield Slider, PA-C [facility] Beeper [phone] 03/13/2015, 12:36 PM     Pt seen, examined and agree w A/P as above.  Vinson Moselle MD [employer] pager [phone]    cell [phone] 03/13/2015, 1:39 PM

## 2015-03-13 NOTE — Op Note (Signed)
OPERATIVE REPORT  Date of Surgery: 03/13/2015  Surgeon: Josephina GipJames Timmi Devora, MD  Assistant: Karsten RoKim Trinh PA  Pre-op Diagnosis: Gangrene right 5th toe I96; Peripheral arterial disease I70.92  Post-op Diagnosis: Gangrene right 5th toe I96; Peripheral arterial disease I70.92  Procedure: Procedure(s): RIGHT  ABOVE KNEE AMPUTATION  Anesthesia: General  EBL: 100 cc  Complications: None  Procedure Details:  \The patient was taken to the operating room placed in supine position at which time satisfactory general endotracheal anesthesia was administered. The right leg was prepped Betadine scrub and solution draped in routine sterile manner isolating the lower leg with an impervious stockinette. Patient had extensive gangrene of the right foot. Skin flaps for a right above-knee dictation were then marked with equal anterior and posterior flaps. Incision carried down through skin and subcutaneous tissue with the scalpel. The muscle was divided with the  Bovie. The femur was then identified and cleaned proximally periosteal elevator. Superficial femoral artery and nerve and vein were individually ligated with 2-0 silk ties and ligatures. Femur was clean proximal left periosteal elevator divided with the Stryker saw and smoothed with a rasp. Posterior muscles divided with the OV. Specimen was removed from the table. Adequate hemostasis was achieved following thorough irrigation Hemovac drain brought out medially and laterally secured with 2-0 silk sutures. The fascia was enclosed over the bone with interrupted 2-0 Vicryl skin in closed with 2-0 Vicryl in a subcuticular fashion and staples for the skin. Sterile dressing was applied patient taken to recovery room in stable condition   Josephina GipJames Raphaella Larkin, MD 03/13/2015 8:53 AM

## 2015-03-13 NOTE — Anesthesia Procedure Notes (Signed)
Procedure Name: LMA Insertion Date/Time: 03/13/2015 7:53 AM Performed by: Jerilee HohMUMM, Charika Mikelson N Pre-anesthesia Checklist: Patient identified, Emergency Drugs available, Suction available and Patient being monitored Patient Re-evaluated:Patient Re-evaluated prior to inductionOxygen Delivery Method: Circle system utilized Preoxygenation: Pre-oxygenation with 100% oxygen Intubation Type: IV induction LMA: LMA inserted LMA Size: 4.0 Tube type: Oral Number of attempts: 1 Placement Confirmation: positive ETCO2 and breath sounds checked- equal and bilateral Tube secured with: Tape Dental Injury: Teeth and Oropharynx as per pre-operative assessment

## 2015-03-13 NOTE — Progress Notes (Signed)
Utilization review completed.  

## 2015-03-13 NOTE — H&P (View-Only) (Signed)
HISTORY AND PHYSICAL   History of Present Illness:  Patient is a 54 y.o. year old female who presents for evaluation of her right foot wounds.  She states she has been treating the wounds with santyl daily dressing changes at her care facility.   The 5th toe was injured about a month before and it is getting worse.  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  She has ESRD and goes to HD M-W-F via right UE AV fistula.    Other medical problems include has Diabetes mellitus; HTN (hypertension); Anemia; ESRD (end stage renal disease) on dialysis (HCC); Obesities, morbid (HCC); Osteomyelitis (HCC); Secondary hyperparathyroidism (of renal origin); Complication of vascular access for dialysis; Septic shock(785.52); Encephalopathy acute; Respiratory failure (HCC); Severe sepsis(995.92); Thrombocytopenia (HCC); Altered mental status; Seizure-like activity (HCC); Mechanical complication of other vascular device, implant, and graft; Ulcer of lower limb, unspecified; Foot ulcer (HCC); Cellulitis of right foot; Diabetes mellitus type 2, controlled (HCC); Chronic anemia; Diabetic foot infection (HCC); Type 2 diabetes mellitus, controlled, with renal complications (HCC); Cellulitis and abscess of foot, except toes; Type II or unspecified type diabetes mellitus with other specified manifestations, not stated as uncontrolled; End stage renal disease (HCC); Unspecified essential hypertension; Unspecified local infection of skin and subcutaneous tissue; Renal dialysis status(V45.11); and Kidney dialysis status on her problem list.  Past Medical History  Diagnosis Date  . Hypertension   . Anemia   . Diabetes mellitus   . Peripheral vascular disease (HCC)   . Hemodialysis patient (HCC) 03/15/11    "Tues; Thurs; Sat; [facility]"  . Blood transfusion   . Chronic back pain   . Rash  03/15/11    "admitted me to find out what its' from; UE, waist, thighs, groin"  . Renal failure     hd 4/12  . Stroke [facility](HCC)     Past Surgical History  Procedure Laterality Date  . Insertion of dialysis catheter  07/29/10    right subclavian  . Arteriovenous graft placement  10/01/10    left upper arm  . Below knee leg amputation  2004    left  . Amputation  03/04/2011    Procedure: AMPUTATION DIGIT;  Surgeon: Nadara MustardMarcus V Duda, MD;  [facility];  Service: Orthopedics;  Laterality: Right;  RT GREAT TOE AMP  . Cataract extraction w/ intraocular lens implant  12/2009    right eye  . Carpal tunnel release  ~ 2009    left wrist  . Tee without cardioversion  03/18/2011    Procedure: TRANSESOPHAGEAL ECHOCARDIOGRAM (TEE);  Surgeon: Marca Anconaalton McLean, MD;  [facility];  Service: Cardiovascular;  Laterality: N/A;  . Hand amputation Left     Social History Social History  Substance Use Topics  . Smoking status: Never Smoker   . Smokeless tobacco: Never Used  . Alcohol Use: No    Family History Family History  Problem Relation Age of Onset  . Diabetes Mellitus II Mother   . Stroke Mother     Allergies  Allergies  Allergen Reactions  . Codeine Hives  . Levofloxacin Other (See Comments)    unknown  . Penicillins Hives     Current Outpatient Prescriptions  Medication Sig Dispense Refill  . cefTAZidime 2 g in dextrose 5 % 50 mL Inject 2 g into the vein every Monday, Wednesday, and Friday with hemodialysis. LAST  DOSE on 01/09/15 12 vial 0  . collagenase (SANTYL) ointment Apply topically daily. To eschar on R-foot and cover with moist dressing and wrap with Kerlix.  Change once daily 15 g 0  . guaiFENesin-codeine (ROBITUSSIN AC) 100-10 MG/5ML syrup Take 30 mLs by mouth every 6 (six) hours as needed for cough.    Marland Kitchen HYDROcodone-acetaminophen (NORCO/VICODIN) 5-325 MG per tablet Take 1-2 tablets by mouth every 6 (six) hours as needed for moderate pain. 20 tablet 0  . insulin  aspart (NOVOLOG) 100 UNIT/ML injection Inject 0-10 Units into the skin 3 (three) times a week. Mondays, Wednesdays and Fridays    . insulin detemir (LEVEMIR) 100 UNIT/ML injection Inject 10 Units into the skin at bedtime.    Marland Kitchen lanthanum (FOSRENOL) 1000 MG chewable tablet Chew 1 tablet (1,000 mg total) by mouth 3 (three) times daily with meals. (Patient taking differently: Chew 1,500 mg by mouth 3 (three) times daily with meals. ) 180 tablet 0  . loratadine (CLARITIN) 10 MG tablet Take 10 mg by mouth daily.    . meclizine (ANTIVERT) 25 MG tablet Take 25 mg by mouth 3 (three) times daily as needed for dizziness.    . Melatonin 5 MG TABS Take 5 mg by mouth at bedtime.    . metoCLOPramide (REGLAN) 5 MG tablet Take 5 mg by mouth 4 (four) times daily.    . metoprolol tartrate (LOPRESSOR) 25 MG tablet Take 12.5 mg by mouth See admin instructions. 12.5mg  twice daily on Sunday, Tuesday, Thursday, Saturday    . Multiple Vitamins-Iron (MULTIVITAMINS WITH IRON) TABS tablet Take 1 tablet by mouth daily.    . Nutritional Supplements (FEEDING SUPPLEMENT, NEPRO CARB STEADY,) LIQD Take 237 mLs by mouth 2 (two) times daily between meals. 60 Can 0  . pantoprazole (PROTONIX) 40 MG tablet Take 40 mg by mouth every 12 (twelve) hours.     . traMADol (ULTRAM) 50 MG tablet Take 50 mg by mouth every 12 (twelve) hours as needed for moderate pain.    . vancomycin 750 mg in sodium chloride 0.9 % 100 mL Inject 750 mg into the vein every Monday, Wednesday, and Friday with hemodialysis. LAST DOSE on 01/09/15 12 vial 0  . venlafaxine XR (EFFEXOR-XR) 75 MG 24 hr capsule Take 225 mg by mouth daily with breakfast.     No current facility-administered medications for this visit.    ROS:   General:  No weight loss, Fever, chills  HEENT: No recent headaches, no nasal bleeding, no visual changes, no sore throat  Neurologic: No dizziness, blackouts, seizures. No recent symptoms of stroke or mini- stroke. No recent episodes of  slurred speech, or temporary blindness.  History of CVA  Cardiac: No recent episodes of chest pain/pressure, no shortness of breath at rest.  No shortness of breath with exertion.  Denies history of atrial fibrillation or irregular heartbeat  Vascular: No history of rest pain in feet.  No history of claudication.  No history of non-healing ulcer, No history of DVT   Pulmonary: No home oxygen, no productive cough, no hemoptysis,  No asthma or wheezing  Musculoskeletal:   Arthritis,  Low back pain,   Joint pain  Hematologic:No history of hypercoagulable state.  No history of easy bleeding.  No history of anemia  Gastrointestinal: No hematochezia or melena,  No gastroesophageal reflux, no trouble swallowing  Urinary:  chronic Kidney disease,  on HD -  MWF or  TTHS,  Burning with  urination,  Frequent urination,  Difficulty urinating;   Skin: No rashes, open wound plantar right foot and dry gangrene 5th toe  Psychological: No history of anxiety,  No history of depression   Physical Examination  Filed Vitals:   03/10/15 1441 03/10/15 1443  BP: 177/82 154/80  Pulse: 20   Height:  (1.626 m)   Weight: 169 lb (76.658 kg)   SpO2: 97%     Body mass index is 28.99 kg/(m^2).  General:  Alert and oriented, no acute distress HEENT: Normal Pulmonary: Clear to auscultation bilaterally Cardiac: Regular Rate and Rhythm without murmur Abdomen: Soft, non-tender, non-distended, no mass, no scars Skin: No rash Extremity Pulses:  Non  radial,  palpable brachial, non palpable femoral, dorsalis pedis, posterior tibial pulses bilaterally Musculoskeletal: multiple amputated digits , left hand amputation, left BKA and fingers and toes on the right UE and LE extremities Neurologic: Upper and lower extremity motor 5/5 and symmetric  DATA:   ABI monophasic flow with suggesting arterial disease    ASSESSMENT:   PAD, 5th toe dry gangrene and plantar wound full  thickness with infection DM type I uncontrolled ESRD on HD M-W-F [address]   [place]:   BKA verses AKA Nov. 18 th 2016 by Dr. Josephina Gip.  The patient is in agreement with the plan and will like;ly be in the hospital for 2-3 days.  She will be discharged back to her current SNF [facility] center.   Thomasena Edis, EMMA MAUREEN PA-C [facility]  The patient was seen in conjunction with Dr. Arbie Cookey I have examined the patient, reviewed and agree with above. Discussed with the patient. Obviously not a viable foot. Bili considerations above-knee versus below-knee amputation. She clinically does appear to have adequate flow to heal a below-knee amputation. I did explain that this would give her minimal benefit from a quality of life standpoint due to her contralateral BKA in her left hand amputation and the multiple fingers amputated off her right hand. She wishes to consider this further and will discuss this prior to surgery. Surgery is scheduled for Dr. Hart Rochester on 03/13/2015. She understands she will be hospitalized for several days and return to nursing facility. We will schedule an inpatient hemodialysis  Gretta Began, MD 03/10/2015 4:07 PM

## 2015-03-13 NOTE — Progress Notes (Signed)
Thank you for consult on [name]. Chart reviewed--patient with history of HTN, ESRD-HD TTS, chronic back pain, L-BKA, DM type 2 with diabetic foot infection requiring IV antibiotics and local wound care. [facility] and was admitted for L-AKA due to failure of conservative therapy. [facility] after discharge.

## 2015-03-13 NOTE — Anesthesia Postprocedure Evaluation (Signed)
  Anesthesia Post-op Note  Patient: Bonnie NetPatricia A [name]  Procedure(s) Performed: Procedure(s): RIGHT  ABOVE KNEE AMPUTATION (Right)  Patient Location: PACU  Anesthesia Type:General  Level of Consciousness: awake and alert   Airway and Oxygen Therapy: Patient Spontanous Breathing  Post-op Pain: mild  Post-op Assessment: Post-op Vital signs reviewed              Post-op Vital Signs: Reviewed  Last Vitals:  Filed Vitals:   03/13/15 1036  BP: 162/64  Pulse: 92  Temp: 36.7 C  Resp: 17    Complications: No apparent anesthesia complications

## 2015-03-14 DIAGNOSIS — I70261 Atherosclerosis of native arteries of extremities with gangrene, right leg: Secondary | ICD-10-CM

## 2015-03-14 DIAGNOSIS — E0852 Diabetes mellitus due to underlying condition with diabetic peripheral angiopathy with gangrene: Secondary | ICD-10-CM

## 2015-03-14 DIAGNOSIS — E0865 Diabetes mellitus due to underlying condition with hyperglycemia: Secondary | ICD-10-CM

## 2015-03-14 DIAGNOSIS — I4581 Long QT syndrome: Secondary | ICD-10-CM

## 2015-03-14 DIAGNOSIS — N186 End stage renal disease: Secondary | ICD-10-CM

## 2015-03-14 DIAGNOSIS — Z992 Dependence on renal dialysis: Secondary | ICD-10-CM

## 2015-03-14 LAB — CBC
HCT: 33.1 % — ABNORMAL LOW (ref 36.0–46.0)
HEMOGLOBIN: 9.9 g/dL — AB (ref 12.0–15.0)
MCH: 29.3 pg (ref 26.0–34.0)
MCHC: 29.9 g/dL — ABNORMAL LOW (ref 30.0–36.0)
MCV: 97.9 fL (ref 78.0–100.0)
PLATELETS: 190 10*3/uL (ref 150–400)
RBC: 3.38 MIL/uL — AB (ref 3.87–5.11)
RDW: 15.2 % (ref 11.5–15.5)
WBC: 9.1 10*3/uL (ref 4.0–10.5)

## 2015-03-14 LAB — RENAL FUNCTION PANEL
ALBUMIN: 2.8 g/dL — AB (ref 3.5–5.0)
ANION GAP: 11 (ref 5–15)
BUN: 27 mg/dL — ABNORMAL HIGH (ref 6–20)
CALCIUM: 9.2 mg/dL (ref 8.9–10.3)
CO2: 31 mmol/L (ref 22–32)
CREATININE: 6.14 mg/dL — AB (ref 0.44–1.00)
Chloride: 95 mmol/L — ABNORMAL LOW (ref 101–111)
GFR, EST AFRICAN AMERICAN: 8 mL/min — AB (ref >60)
GFR, EST NON AFRICAN AMERICAN: 7 mL/min — AB (ref >60)
Glucose, Bld: 138 mg/dL — ABNORMAL HIGH (ref 65–99)
PHOSPHORUS: 6.7 mg/dL — AB (ref 2.5–4.6)
Potassium: 4.3 mmol/L (ref 3.5–5.1)
SODIUM: 137 mmol/L (ref 135–145)

## 2015-03-14 LAB — GLUCOSE, CAPILLARY
GLUCOSE-CAPILLARY: 127 mg/dL — AB (ref 65–99)
GLUCOSE-CAPILLARY: 118 mg/dL — AB (ref 65–99)
GLUCOSE-CAPILLARY: 179 mg/dL — AB (ref 65–99)
Glucose-Capillary: 158 mg/dL — ABNORMAL HIGH (ref 65–99)

## 2015-03-14 MED ORDER — LIDOCAINE HCL (PF) 1 % IJ SOLN: 5.0000 mL | INTRAMUSCULAR | Status: DC | PRN

## 2015-03-14 MED ORDER — ALTEPLASE 2 MG IJ SOLR
2.0000 mg | Freq: Once | INTRAMUSCULAR | Status: DC | PRN
Start: 2015-03-14 — End: 2015-03-14
  Filled 2015-03-14: qty 2

## 2015-03-14 MED ORDER — HEPARIN SODIUM (PORCINE) 1000 UNIT/ML DIALYSIS: 1000.0000 [IU] | INTRAMUSCULAR | Status: DC | PRN

## 2015-03-14 MED ORDER — PENTAFLUOROPROP-TETRAFLUOROETH EX AERO: 1.0000 "application " | INHALATION_SPRAY | CUTANEOUS | Status: DC | PRN

## 2015-03-14 MED ORDER — SODIUM CHLORIDE 0.9 % IV SOLN: 100.0000 mL | INTRAVENOUS | Status: DC | PRN

## 2015-03-14 MED ORDER — LIDOCAINE-PRILOCAINE 2.5-2.5 % EX CREA: 1.0000 "application " | TOPICAL_CREAM | CUTANEOUS | Status: DC | PRN

## 2015-03-14 MED ORDER — HYDROMORPHONE HCL 1 MG/ML IJ SOLN
INTRAMUSCULAR | Status: AC
  Filled 2015-03-14: qty 1

## 2015-03-14 MED ORDER — VANCOMYCIN HCL IN DEXTROSE 750-5 MG/150ML-% IV SOLN
750.0000 mg | Freq: Once | INTRAVENOUS | Status: AC
  Administered 2015-03-14: 750 mg via INTRAVENOUS
  Filled 2015-03-14: qty 150

## 2015-03-14 MED ORDER — DARBEPOETIN ALFA 60 MCG/0.3ML IJ SOSY
PREFILLED_SYRINGE | INTRAMUSCULAR | Status: AC
  Filled 2015-03-14: qty 0.3

## 2015-03-14 MED ORDER — DOXERCALCIFEROL 4 MCG/2ML IV SOLN
INTRAVENOUS | Status: AC
  Filled 2015-03-14: qty 2

## 2015-03-14 MED ORDER — ONDANSETRON HCL 4 MG/2ML IJ SOLN
INTRAMUSCULAR | Status: AC
  Filled 2015-03-14: qty 2

## 2015-03-14 MED ORDER — ALTEPLASE 2 MG IJ SOLR
2.0000 mg | Freq: Once | INTRAMUSCULAR | Status: DC | PRN
  Filled 2015-03-14: qty 2

## 2015-03-14 NOTE — Progress Notes (Signed)
PROGRESS NOTE  [name] BMW:[id number] DOB: August 03, 1960 DOA: 03/13/2015 PCP: Irena Cords, MD  HPI/Recap of past 45 hours: 54 year old female with past medical history of end-stage renal disease, peripheral vascular disease, diabetes mellitus and previous multiple limb amputation admitted on 11/18 for right AKA done that same day.  Patient seen today in dialysis. Feels mildly nauseated, but otherwise no complaints. Some pain but tolerable  Assessment/Plan: Active Problems:   Uncontrolled diabetes mellitus (HCC) with peripheral vascular disease: Blood sugar stable continue to monitor   HTN (hypertension): Watching blood pressure closely, some episodes of hypotension   ESRD (end stage renal disease) on dialysis [facility]), colon being seen by nephrology. 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 Chronic issue   Code Status: Full code  Family Communication: left message with husband  Disposition Plan: anticipate return back to [facility] health care on Monday   Consultants:  Vascular surgery  Nephrology  Procedures:  Status post right AKA done 11/18  Hemodialysis done 11/19   Antibiotics  IV vancomycin 1 dose 11/19   Objective: BP 114/39 mmHg  Pulse 94  Temp(Src) 97.7 F (36.5 C) (Oral)  Resp 16  Ht  (1.626 m)  Wt 77.7 kg (171 lb 4.8 oz)  BMI 29.39 kg/m2  SpO2 98%  Intake/Output Summary (Last 24 hours) at 03/14/15 1427 Last data filed at 03/14/15 1115  Gross per 24 hour  Intake    240 ml  Output   1449 ml  Net  -1209 ml   Filed Weights   03/14/15 0300 03/14/15 0704 03/14/15 1115  Weight: 77.792 kg (171 lb 8 oz) 79 kg (174 lb 2.6 oz) 77.7 kg (171 lb 4.8 oz)    Exam:   General:  alert and oriented 3, mild distress from nausea  CarCardiovascular:  Regular rate and rhythm, S1-S2  Respiratory: clear to auscultation bilaterally  Abdomen: soft, nontender, nondistended, positive bowel sounds  musculoskeletal: Status post right AKA   Data Reviewed: Basic Metabolic Panel:  Recent Labs Lab 03/13/15 0657 03/13/15 1135 03/14/15 0256  NA 136  --  137  K 3.6  --  4.3  CL  --   --  95*  CO2  --   --  31  GLUCOSE 196*  --  138*  BUN  --   --  27*  CREATININE  --  5.23* 6.14*  CALCIUM  --   --  9.2  PHOS  --   --  6.7*   Liver Function Tests:  Recent Labs Lab 03/14/15 0256  ALBUMIN 2.8*   No results for input(s): LIPASE, AMYLASE in the last 168 hours. No results for input(s): AMMONIA in the last 168 hours. CBC:  Recent Labs Lab 03/13/15 0657 03/13/15 1135 03/14/15 0256  WBC  --  8.7 9.1  HGB 11.6* 10.7* 9.9*  HCT 34.0* 33.5* 33.1*  MCV  --  95.2 97.9  PLT  --  177 190   Cardiac Enzymes:   No results for input(s): CKTOTAL, CKMB, CKMBINDEX, TROPONINI in the last 168 hours. BNP (last 3 results) No results for input(s): BNP in the last 8760 hours.  ProBNP (last 3 results) No results for input(s): PROBNP in the last 8760 hours.  CBG:  Recent Labs Lab 03/13/15 1101 03/13/15 1630 03/13/15 2049 03/14/15 0632 03/14/15 1129  GLUCAP 202*  211* 137* 127* 118*    No results found for this or any previous visit (from the past 240 hour(s)).   Studies: No results found.  Scheduled Meds: . antiseptic oral rinse  7 mL Mouth Rinse BID  . docusate sodium  100 mg Oral Daily  . [START ON 03/16/2015] doxercalciferol  4 mcg Intravenous Q M,W,F-HD  . feeding supplement (ENSURE ENLIVE)  237 mL Oral QPM  . heparin  5,000 Units Subcutaneous 3 times per day  . insulin aspart  0-10 Units Subcutaneous Once per day on Mon Wed Fri  . insulin detemir  18 Units Subcutaneous QHS  . lanthanum  2,000 mg Oral TID WC  . loratadine  10 mg Oral Daily  . metoprolol tartrate  12.5 mg Oral 2 times per day on Sun Tue Thu Sat  .  multivitamin  1 tablet Oral QHS  . pantoprazole  40 mg Oral Q12H  . sodium chloride  3 mL Intravenous Q12H  . vancomycin  750 mg Intravenous Once  . venlafaxine XR  225 mg Oral Q breakfast    Continuous Infusions: . sodium chloride 10 mL/hr at 03/13/15 1030     Time spent: 15 min  Hollice EspyKRISHNAN,Eusebio Blazejewski K  [facility] Pager [phone]. If 7PM-7AM, please contact night-coverage at [email or web address], password [email or web address] 03/14/2015, 2:27 PM  LOS: 1 day

## 2015-03-14 NOTE — Progress Notes (Addendum)
  Progress Note    03/14/2015 9:05 AM 1 Day Post-Op  Subjective:  Pt seen on HD.  C/o pain-just received pain medication  afebrile  Filed Vitals:   03/14/15 0800  BP: 109/56  Pulse: 91  Temp:   Resp: 15    Physical Exam: Incisions:  Right AKA bandage is clean and dry   CBC    Component Value Date/Time   WBC 9.1 03/14/2015 0256   WBC 7.7 12/17/2011 0519   RBC 3.38* 03/14/2015 0256   RBC 3.70* 12/17/2011 0519   HGB 9.9* 03/14/2015 0256   HGB 9.7* 05/26/2012 0213   HCT 33.1* 03/14/2015 0256   HCT 33.5* 12/17/2011 0519   PLT 190 03/14/2015 0256   PLT 157 12/17/2011 0519   MCV 97.9 03/14/2015 0256   MCV 91 12/17/2011 0519   MCH 29.3 03/14/2015 0256   MCH 30.3 12/17/2011 0519   MCHC 29.9* 03/14/2015 0256   MCHC 33.4 12/17/2011 0519   RDW 15.2 03/14/2015 0256   RDW 16.9* 12/17/2011 0519   LYMPHSABS 1.0 12/11/2014 0802   LYMPHSABS 0.9* 12/17/2011 0519   MONOABS 0.9 12/11/2014 0802   MONOABS 0.8 12/17/2011 0519   EOSABS 0.2 12/11/2014 0802   EOSABS 0.2 12/17/2011 0519   BASOSABS 0.0 12/11/2014 0802   BASOSABS 0.1 12/17/2011 0519    BMET    Component Value Date/Time   NA 137 03/14/2015 0256   NA 136 05/27/2012 0313   K 4.3 03/14/2015 0256   K 4.0 05/27/2012 0313   CL 95* 03/14/2015 0256   CL 97* 05/27/2012 0313   CO2 31 03/14/2015 0256   CO2 31 05/27/2012 0313   GLUCOSE 138* 03/14/2015 0256   GLUCOSE 81 05/27/2012 0313   BUN 27* 03/14/2015 0256   BUN 16 05/27/2012 0313   CREATININE 6.14* 03/14/2015 0256   CREATININE 2.61* 05/27/2012 0313   CALCIUM 9.2 03/14/2015 0256   CALCIUM 8.2* 05/27/2012 0313   CALCIUM 8.0* 03/02/2009 1350   GFRNONAA 7* 03/14/2015 0256   GFRNONAA 21* 05/27/2012 0313   GFRNONAA 10* 08/03/2011 0540   GFRAA 8* 03/14/2015 0256   GFRAA 24* 05/27/2012 0313   GFRAA 12* 08/03/2011 0540    INR    Component Value Date/Time   INR 1.17 02/25/2012 1938     Intake/Output Summary (Last 24 hours) at 03/14/15 0905 Last data filed  at 03/14/15 [id number]  Gross per 24 hour  Intake    340 ml  Output     65 ml  Net    275 ml     Assessment/Plan:  54 y.o. female is s/p right above knee amputation  1 Day Post-Op  -dressing is dry and in tact -c/o pain-just received pain medication -will take down dressing and remove drains tomorrow   Doreatha MassedSamantha Rhyne, PA-C [facility] [phone] 03/14/2015 9:05 AM  Agree with above. Dressing dry Dressing change and drain removal tomorrow.  Waverly Ferrarihristopher Shivonne Schwartzman, MD, FACS Beeper [phone] Office: [phone]

## 2015-03-14 NOTE — Progress Notes (Signed)
[facility] Progress Note  Assessment/Plan: 1. s/p right AKA - per [facility]- not clear about d/c date 2. ESRD - MWF - goal set for 2.5 K 4.3 - no heparin today - HD orders written for Monday if not d/c today or Sunday  3. BP/volume - decrease volume on HD ; BP lower than usual probably due to pain meds - goal today 2.5 BP down to 80s asymptomatic - keep systolic ? 85 4. Anemia - continue ESA/high ferritin likely due to infection; repeat with November labs after d/c- given Aranesp 60 on 11/19; stable Hgb 9.9 post op 5. Metabolic bone disease - Continue hectorol Ca 9.2 P 6.7 - 6. Nutrition -renal/carb mod/renavites. 7. DM - per primary  Sheffield SliderMartha B Bergman, PA-C [facility] Beeper [phone] 03/14/2015,8:12 AM  LOS: 1 day   Pt seen, examined and agree w A/P as above.  Vinson Moselleob Yania Bogie MD [facility] pager [phone]    cell [phone] 03/14/2015, 11:15 AM    Subjective:   Having leg pain but tolerating it  Objective Filed Vitals:   03/14/15 0300 03/14/15 0704 03/14/15 0714 03/14/15 0730  BP: 98/33 109/56 95/49 125/40  Pulse: 91 90 66 91  Temp: 97.6 F (36.4 C) 97.5 F (36.4 C)    TempSrc: Oral Oral    Resp: 18 19 20 15   Height:      Weight: 77.792 kg (171 lb 8 oz) 79 kg (174 lb 2.6 oz)    SpO2: 96% 96%     Physical Exam General: NAD spirits good Heart: RRR Lungs: no rales Abdomen: soft NT Extremities: no Left BKA edema, right AKA wrapped with hemovac Dialysis Access: left upper AVF Qb 400   Dialysis Orders: [facility] 4 hr MWF 400/A 1.5 EDW 79 2K 2.25 Ca heparin 5100 Mircera 50 q two weeks-last 11/9 just resumed from 75 9/14 Hectorol 4 Recent labs: Hgb 9.6 declining , ferritin 2100 25% sat Ca/P ok iPTH 217   Additional Objective Labs: Basic Metabolic Panel:  Recent Labs Lab 03/13/15 0657 03/13/15 1135 03/14/15 0256  NA 136  --  137  K 3.6  --  4.3  CL  --   --  95*  CO2  --   --  31   GLUCOSE 196*  --  138*  BUN  --   --  27*  CREATININE  --  5.23* 6.14*  CALCIUM  --   --  9.2  PHOS  --   --  6.7*   Liver Function Tests:  Recent Labs Lab 03/14/15 0256  ALBUMIN 2.8*   CBC:  Recent Labs Lab 03/13/15 0657 03/13/15 1135 03/14/15 0256  WBC  --  8.7 9.1  HGB 11.6* 10.7* 9.9*  HCT 34.0* 33.5* 33.1*  MCV  --  95.2 97.9  PLT  --  177 190  CBG:  Recent Labs Lab 03/13/15 0904 03/13/15 1101 03/13/15 1630 03/13/15 2049 03/14/15 0632  GLUCAP 231* 202* 211* 137* 127*  Medications: . sodium chloride 10 mL/hr at 03/13/15 1030   . antiseptic oral rinse  7 mL Mouth Rinse BID  . darbepoetin (ARANESP) injection - DIALYSIS  60 mcg Intravenous Q Sat-HD  . docusate sodium  100 mg Oral Daily  . doxercalciferol  4 mcg Intravenous Q T,Th,Sa-HD  . [START ON 03/16/2015] doxercalciferol  4 mcg Intravenous Q M,W,F-HD  . feeding supplement (ENSURE ENLIVE)  237 mL Oral QPM  . heparin  5,000 Units Subcutaneous 3  times per day  . insulin aspart  0-10 Units Subcutaneous Once per day on Mon Wed Fri  . insulin detemir  18 Units Subcutaneous QHS  . lanthanum  2,000 mg Oral TID WC  . loratadine  10 mg Oral Daily  . metoprolol tartrate  12.5 mg Oral 2 times per day on Sun Tue Thu Sat  . multivitamin  1 tablet Oral QHS  . pantoprazole  40 mg Oral Q12H  . sodium chloride  3 mL Intravenous Q12H  . venlafaxine XR  225 mg Oral Q breakfast

## 2015-03-14 NOTE — Progress Notes (Signed)
PT Cancellation Note  Patient Details Name: [name] MRN: [id number] DOB: Aug 08, 1960   Cancelled Treatment:    Reason Eval/Treat Not Completed: Pain limiting ability to participate;Fatigue/lethargy limiting ability to participate;Patient declined, dialysis this morning, not feeling well/up to therapy evaluation today.   Freida BusmanAllen, Juliza Machnik L 03/14/2015, 3:12 PM

## 2015-03-14 NOTE — Progress Notes (Signed)
OT Cancellation Note  Patient Details Name: [name] MRN: [id number] DOB: May 18, 1960   Cancelled Treatment:    Reason Eval/Treat Not Completed: Patient at procedure or test/ unavailable (HD).  Evern BioMayberry, Aryah Doering Lynn 03/14/2015, 8:30 AM

## 2015-03-15 DIAGNOSIS — I10 Essential (primary) hypertension: Secondary | ICD-10-CM

## 2015-03-15 LAB — BASIC METABOLIC PANEL
ANION GAP: 10 (ref 5–15)
BUN: 15 mg/dL (ref 6–20)
CO2: 29 mmol/L (ref 22–32)
Calcium: 9 mg/dL (ref 8.9–10.3)
Chloride: 96 mmol/L — ABNORMAL LOW (ref 101–111)
Creatinine, Ser: 3.98 mg/dL — ABNORMAL HIGH (ref 0.44–1.00)
GFR calc Af Amer: 14 mL/min — ABNORMAL LOW (ref >60)
GFR, EST NON AFRICAN AMERICAN: 12 mL/min — AB (ref >60)
Glucose, Bld: 169 mg/dL — ABNORMAL HIGH (ref 65–99)
POTASSIUM: 3.7 mmol/L (ref 3.5–5.1)
SODIUM: 135 mmol/L (ref 135–145)

## 2015-03-15 LAB — GLUCOSE, CAPILLARY
GLUCOSE-CAPILLARY: 179 mg/dL — AB (ref 65–99)
GLUCOSE-CAPILLARY: 148 mg/dL — AB (ref 65–99)
GLUCOSE-CAPILLARY: 116 mg/dL — AB (ref 65–99)
Glucose-Capillary: 134 mg/dL — ABNORMAL HIGH (ref 65–99)

## 2015-03-15 LAB — CBC
HEMATOCRIT: 32.7 % — AB (ref 36.0–46.0)
HEMOGLOBIN: 9.8 g/dL — AB (ref 12.0–15.0)
MCH: 29.6 pg (ref 26.0–34.0)
MCHC: 30 g/dL (ref 30.0–36.0)
MCV: 98.8 fL (ref 78.0–100.0)
Platelets: 159 10*3/uL (ref 150–400)
RBC: 3.31 MIL/uL — AB (ref 3.87–5.11)
RDW: 15.3 % (ref 11.5–15.5)
WBC: 10 10*3/uL (ref 4.0–10.5)

## 2015-03-15 MED ORDER — PROMETHAZINE HCL 25 MG/ML IJ SOLN: 12.5000 mg | Freq: Four times a day (QID) | INTRAMUSCULAR | Status: DC | PRN

## 2015-03-15 NOTE — Progress Notes (Signed)
PROGRESS NOTE  MILAN CLARE ZOX:[id number] DOB: 1961-02-18 DOA: 03/13/2015 PCP: Irena Cords, MD  HPI/Recap of past 23 hours: 54 year old female with past medical history of end-stage renal disease, peripheral vascular disease, diabetes mellitus and previous multiple limb amputation admitted on 11/18 for right AKA done that same day.  Post-op doing ok.  Received HD 11/19.  No complaints today.  Assessment/Plan: Active Problems:   Uncontrolled diabetes mellitus (HCC) with peripheral vascular disease: Blood sugar stable continue to monitor   HTN (hypertension): Watching blood pressure closely, some episodes of hypotension   ESRD (end stage renal disease) on dialysis (HCC):being seen by nephrology. 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 Chronic issue   Code Status: Full code  Family Communication: spoke with husband at the bedside  Disposition Plan: anticipate return back to [facility] health care on Monday   Consultants:  Vascular surgery  Nephrology  Procedures:  Status post right AKA done 11/18  Hemodialysis done 11/19   Antibiotics  IV vancomycin 1 dose 11/19   Objective: BP 107/35 mmHg  Pulse 85  Temp(Src) 98.4 F (36.9 C) (Oral)  Resp 16  Ht  (1.626 m)  Wt 77.2 kg (170 lb 3.1 oz)  BMI 29.20 kg/m2  SpO2 99%  Intake/Output Summary (Last 24 hours) at 03/15/15 1621 Last data filed at 03/15/15 8119  Gross per 24 hour  Intake      0 ml  Output     40 ml  Net    -40 ml   Filed Weights   03/14/15 0704 03/14/15 1115 03/15/15 0455  Weight: 79 kg (174 lb 2.6 oz) 77.7 kg (171 lb 4.8 oz) 77.2 kg (170 lb 3.1 oz)    Exam:   General:  alert and oriented 3, NAD  CarCardiovascular: Regular rate and rhythm, S1-S2  Respiratory: clear  to auscultation bilaterally  Abdomen: soft, nontender, nondistended, positive bowel sounds  musculoskeletal: Status post right AKA   Data Reviewed: Basic Metabolic Panel:  Recent Labs Lab 03/13/15 0657 03/13/15 1135 03/14/15 0256 03/15/15 0230  NA 136  --  137 135  K 3.6  --  4.3 3.7  CL  --   --  95* 96*  CO2  --   --  31 29  GLUCOSE 196*  --  138* 169*  BUN  --   --  27* 15  CREATININE  --  5.23* 6.14* 3.98*  CALCIUM  --   --  9.2 9.0  PHOS  --   --  6.7*  --    Liver Function Tests:  Recent Labs Lab 03/14/15 0256  ALBUMIN 2.8*   No results for input(s): LIPASE, AMYLASE in the last 168 hours. No results for input(s): AMMONIA in the last 168 hours. CBC:  Recent Labs Lab 03/13/15 0657 03/13/15 1135 03/14/15 0256 03/15/15 0230  WBC  --  8.7 9.1 10.0  HGB 11.6* 10.7* 9.9* 9.8*  HCT 34.0* 33.5* 33.1* 32.7*  MCV  --  95.2 97.9 98.8  PLT  --  177 190 159   Cardiac Enzymes:   No results for input(s): CKTOTAL, CKMB, CKMBINDEX, TROPONINI in the last 168 hours. BNP (last 3 results) No results for input(s): BNP in the last 8760 hours.  ProBNP (last 3 results) No results for input(s): PROBNP in the  last 8760 hours.  CBG:  Recent Labs Lab 03/14/15 1129 03/14/15 1618 03/14/15 2111 03/15/15 0616 03/15/15 1126  GLUCAP 118* 158* 179* 134* 179*    No results found for this or any previous visit (from the past 240 hour(s)).   Studies: No results found.  Scheduled Meds: . antiseptic oral rinse  7 mL Mouth Rinse BID  . docusate sodium  100 mg Oral Daily  . [START ON 03/16/2015] doxercalciferol  4 mcg Intravenous Q M,W,F-HD  . feeding supplement (ENSURE ENLIVE)  237 mL Oral QPM  . heparin  5,000 Units Subcutaneous 3 times per day  . insulin aspart  0-10 Units Subcutaneous Once per day on Mon Wed Fri  . insulin detemir  18 Units Subcutaneous QHS  . lanthanum  2,000 mg Oral TID WC  . loratadine  10 mg Oral Daily  . metoprolol tartrate  12.5 mg Oral 2 times  per day on Sun Tue Thu Sat  . multivitamin  1 tablet Oral QHS  . pantoprazole  40 mg Oral Q12H  . sodium chloride  3 mL Intravenous Q12H  . venlafaxine XR  225 mg Oral Q breakfast    Continuous Infusions: . sodium chloride 10 mL/hr at 03/13/15 1030     Time spent: 10 min  Hollice EspyKRISHNAN,Joziah Dollins K  [facility] Pager [phone]. If 7PM-7AM, please contact night-coverage at [email or web address], password [email or web address] 03/15/2015, 4:21 PM  LOS: 2 days

## 2015-03-15 NOTE — Evaluation (Signed)
Physical Therapy Evaluation Patient Details Name: [name] MRN: [id number] DOB: [birth year] Today's Date: 03/15/2015   History of Present Illness  54 yo female with Rt AKA on 03/13/15.  PMHx: DM, ESRD (Dialysis M,W,F), history of Lt BKA, and multiple bilateral finger amputations, HTN    Clinical Impression  Pt presents with severe limitations to functional mobility.  Previously dependent on SNF staff for bed mobility and transfers, typically bed- or WC-bound at baseline.  No skilled PT needs identified, recommend return to facility at d/c.     Follow Up Recommendations SNF (return to previous facility)    Equipment Recommendations  None recommended by PT    Recommendations for Other Services       Precautions / Restrictions Precautions Precautions: Fall Precaution Comments: use lift equipment Restrictions Other Position/Activity Restrictions: elevate R residual limb       Mobility  Bed Mobility Overal bed mobility: Needs Assistance Bed Mobility: Rolling;Supine to Sit;Sit to Supine Rolling: Max assist   Supine to sit: Total assist;HOB elevated Sit to supine: Total assist   General bed mobility comments: pt able to use RUE to assist with weak grip and pull on rail, but left UE is amp below wrist; used pad to move to EOB, see sitting balance below but cannot sit unsupported.  returned supine at pt request after 1-2 min  Transfers                 General transfer comment: dependent  Ambulation/Gait             General Gait Details: unable  Stairs            Wheelchair Mobility    Modified Rankin (Stroke Patients Only)       Balance Overall balance assessment: Needs assistance Sitting-balance support: Single extremity supported;Feet unsupported Sitting balance-Leahy Scale: Zero Sitting balance - Comments: leans post, uses L leg ineffectively for counterbalance, cannot assume neutral mainly due to fear of falling foward Postural  control: Posterior lean                                   Pertinent Vitals/Pain Pain Assessment: Faces Faces Pain Scale: Hurts little more Pain Location: Right residual limb Pain Descriptors / Indicators: Aching;Tender Pain Intervention(s): Limited activity within patient's tolerance;Monitored during session;Repositioned    Home Living Family/patient expects to be discharged to:: Skilled nursing facility Living Arrangements: Other (Comment)                    Prior Function Level of Independence: Needs assistance   Gait / Transfers Assistance Needed: total, lifted to WC, spends most of day in bed  ADL's / Homemaking Assistance Needed: dep        Hand Dominance   Dominant Hand: Right    Extremity/Trunk Assessment   Upper Extremity Assessment: Defer to OT evaluation           Lower Extremity Assessment: RLE deficits/detail;LLE deficits/detail RLE Deficits / Details: new AKA LLE Deficits / Details: previous BKA, former prosthesis user     Communication   Communication: No difficulties  Cognition Arousal/Alertness: Awake/alert Behavior During Therapy: WFL for tasks assessed/performed;Flat affect Overall Cognitive Status: Within Functional Limits for tasks assessed                      General Comments General comments (skin integrity, edema, etc.): R LE incision intact, dry,  no redness noted, but pt commented on swelling; defer to MD for wrapping protocol    Exercises        Assessment/Plan    PT Assessment Patent does not need any further PT services  PT Diagnosis Acute pain;Difficulty walking   PT Problem List    PT Treatment Interventions     PT Goals (Current goals can be found in the Care Plan section) Acute Rehab PT Goals PT Goal Formulation: All assessment and education complete, DC therapy    Frequency     Barriers to discharge        Co-evaluation               End of Session   Activity Tolerance:  Patient limited by fatigue;Patient limited by pain Patient left: in bed;with call bell/phone within reach Nurse Communication: Mobility status         Time: 4098-1191 PT Time Calculation (min) (ACUTE ONLY): 23 min   Charges:   PT Evaluation $Initial PT Evaluation Tier I: 1 Procedure     PT G Codes:        Dennis Bast 03/15/2015, 10:53 AM

## 2015-03-15 NOTE — Progress Notes (Addendum)
  Progress Note    03/15/2015 8:09 AM 14 Days Post-Op  Subjective:  Coughing and vomiting.  States she has tried to eat breakfast, but continues to vomit.  Received Zofran earlier.  Afebrile VSS 97% RA   Filed Vitals:   03/15/15 0521  BP: 139/70  Pulse: 80  Temp: 97.9 F (36.6 C)  Resp: 18    Physical Exam: Incisions:  Healing nicely and staples are in tact.   Extremities:  Good ROM of stump.  CBC    Component Value Date/Time   WBC 8.7 03/13/2015 0439   RBC 3.14* 03/13/2015 0439   HGB 9.1* 03/13/2015 0439   HCT 27.7* 03/13/2015 0439   PLT 560* 03/13/2015 0439   MCV 88.2 03/13/2015 0439   MCH 29.0 03/13/2015 0439   MCHC 32.9 03/13/2015 0439   RDW 13.2 03/13/2015 0439   LYMPHSABS 1.9 02/27/2015 0746   MONOABS 2.1* 02/27/2015 0746   EOSABS 0.0 02/27/2015 0746   BASOSABS 0.0 02/27/2015 0746    BMET    Component Value Date/Time   NA 134* 03/13/2015 0439   K 4.2 03/13/2015 0439   CL 102 03/13/2015 0439   CO2 23 03/13/2015 0439   GLUCOSE 112* 03/13/2015 0439   BUN 17 03/13/2015 0439   CREATININE 1.48* 03/13/2015 0439   CALCIUM 8.6* 03/13/2015 0439   GFRNONAA 48* 03/13/2015 0439   GFRAA 56* 03/13/2015 0439    INR    Component Value Date/Time   INR 1.09 03/02/2015 0840     Intake/Output Summary (Last 24 hours) at 03/15/15 0809 Last data filed at 03/15/15 [id number]  Gross per 24 hour  Intake    700 ml  Output    825 ml  Net   -125 ml     Assessment/Plan:  54 y.o. female is s/p right above knee amputation  14 Days Post-Op  -stump is viable -drains removed this morning -will order a retention sock from [employer]  -may benefit from phenergan  -may return to SNF from vascular standpoint when medically ready -f/u with Dr. Hart RochesterLawson in 4 weeks.  Message has been sent to the office and they will arrange appt.   Doreatha MassedSamantha Rhyne, PA-C [facility] [phone] 03/15/2015 8:09 AM  Agree with above.  Right AKA is healing  nicely.  Waverly Ferrarihristopher Nalla Purdy, MD, FACS [employer] [phone] Office: [phone]

## 2015-03-15 NOTE — Progress Notes (Signed)
[facility] Progress Note  Assessment/Plan: 1. S/P right AKA - per [facility] - [facility]- not clear about d/c date 2. ESRD - MWF.  HD orders written for Monday if not d/c today or Sunday  3. BP/volume - BP's soft, below dry weight 4. Anemia - continue ESA/high ferritin likely due to infection; repeat with November labs after d/c- given Aranesp 60 on 11/19; stable Hgb 9.9 post op 5. Metabolic bone disease - Continue hectorol Ca 9.2 P 6.7 - 6. Nutrition -renal/carb mod/renavites. 7. DM - per primary  Plan - HD Monday if still here, no UF   [facility] pager [phone]    cell [phone] 03/15/2015, 9:35 AM    Subjective:   Having leg pain but tolerating it  Objective Filed Vitals:   03/14/15 1358 03/14/15 2000 03/14/15 2153 03/15/15 0455  BP:  89/36 106/54 140/39  Pulse:  79 75 82  Temp:  98.1 F (36.7 C)  97.5 F (36.4 C)  TempSrc:  Oral  Oral  Resp:  16  16  Height:      Weight:    77.2 kg (170 lb 3.1 oz)  SpO2: 98% 100%  100%   Physical Exam General: NAD spirits good Heart: RRR Lungs: no rales Abdomen: soft NT Extremities: no Left BKA edema, right AKA wrapped with hemovac Dialysis Access: left upper AVF Qb 400   Dialysis Orders: [facility] 4 hr MWF 400/A 1.5 EDW 79 2K 2.25 Ca heparin 5100 Mircera 50 q two weeks-last 11/9 just resumed from 75 9/14 Hectorol 4 Recent labs: Hgb 9.6 declining , ferritin 2100 25% sat Ca/P ok iPTH 217   Additional Objective Labs: Basic Metabolic Panel:  Recent Labs Lab 03/13/15 0657 03/13/15 1135 03/14/15 0256 03/15/15 0230  NA 136  --  137 135  K 3.6  --  4.3 3.7  CL  --   --  95* 96*  CO2  --   --  31 29  GLUCOSE 196*  --  138* 169*  BUN  --   --  27* 15  CREATININE  --  5.23* 6.14* 3.98*  CALCIUM  --   --  9.2 9.0  PHOS  --   --  6.7*  --    Liver Function Tests:  Recent Labs Lab 03/14/15 0256  ALBUMIN 2.8*   CBC:  Recent Labs Lab 03/13/15 1135 03/14/15 0256  03/15/15 0230  WBC 8.7 9.1 10.0  HGB 10.7* 9.9* 9.8*  HCT 33.5* 33.1* 32.7*  MCV 95.2 97.9 98.8  PLT 177 190 159  CBG:  Recent Labs Lab 03/14/15 0632 03/14/15 1129 03/14/15 1618 03/14/15 2111 03/15/15 0616  GLUCAP 127* 118* 158* 179* 134*  Medications: . sodium chloride 10 mL/hr at 03/13/15 1030   . antiseptic oral rinse  7 mL Mouth Rinse BID  . docusate sodium  100 mg Oral Daily  . [START ON 03/16/2015] doxercalciferol  4 mcg Intravenous Q M,W,F-HD  . feeding supplement (ENSURE ENLIVE)  237 mL Oral QPM  . heparin  5,000 Units Subcutaneous 3 times per day  . insulin aspart  0-10 Units Subcutaneous Once per day on Mon Wed Fri  . insulin detemir  18 Units Subcutaneous QHS  . lanthanum  2,000 mg Oral TID WC  . loratadine  10 mg Oral Daily  . metoprolol tartrate  12.5 mg Oral 2 times per day on Sun Tue Thu Sat  . multivitamin  1 tablet Oral QHS  . pantoprazole  40 mg Oral Q12H  . sodium chloride  3 mL Intravenous Q12H  . venlafaxine XR  225 mg Oral Q breakfast

## 2015-03-15 NOTE — Progress Notes (Signed)
Patient refusing to be transferred to chair at this time. Emphasized the importance of early mobility. Will offer again and continue to educate.  Leonidas Rombergaitlin S Bumbledare, RN

## 2015-03-16 ENCOUNTER — Inpatient Hospital Stay (HOSPITAL_COMMUNITY): Payer: Medicare PPO

## 2015-03-16 ENCOUNTER — Encounter (HOSPITAL_COMMUNITY): Payer: Self-pay | Admitting: Vascular Surgery

## 2015-03-16 LAB — GLUCOSE, CAPILLARY
GLUCOSE-CAPILLARY: 104 mg/dL — AB (ref 65–99)
GLUCOSE-CAPILLARY: 129 mg/dL — AB (ref 65–99)
GLUCOSE-CAPILLARY: 138 mg/dL — AB (ref 65–99)

## 2015-03-16 LAB — RENAL FUNCTION PANEL
ALBUMIN: 2.8 g/dL — AB (ref 3.5–5.0)
ANION GAP: 15 (ref 5–15)
BUN: 28 mg/dL — ABNORMAL HIGH (ref 6–20)
CALCIUM: 9.2 mg/dL (ref 8.9–10.3)
CO2: 25 mmol/L (ref 22–32)
Chloride: 94 mmol/L — ABNORMAL LOW (ref 101–111)
Creatinine, Ser: 5.92 mg/dL — ABNORMAL HIGH (ref 0.44–1.00)
GFR calc Af Amer: 9 mL/min — ABNORMAL LOW (ref >60)
GFR, EST NON AFRICAN AMERICAN: 7 mL/min — AB (ref >60)
Glucose, Bld: 119 mg/dL — ABNORMAL HIGH (ref 65–99)
PHOSPHORUS: 5.1 mg/dL — AB (ref 2.5–4.6)
POTASSIUM: 3.7 mmol/L (ref 3.5–5.1)
SODIUM: 134 mmol/L — AB (ref 135–145)

## 2015-03-16 LAB — CBC
HEMATOCRIT: 31.4 % — AB (ref 36.0–46.0)
HEMOGLOBIN: 10 g/dL — AB (ref 12.0–15.0)
MCH: 30.4 pg (ref 26.0–34.0)
MCHC: 31.8 g/dL (ref 30.0–36.0)
MCV: 95.4 fL (ref 78.0–100.0)
Platelets: 163 10*3/uL (ref 150–400)
RBC: 3.29 MIL/uL — AB (ref 3.87–5.11)
RDW: 15.3 % (ref 11.5–15.5)
WBC: 11.3 10*3/uL — AB (ref 4.0–10.5)

## 2015-03-16 MED ORDER — HYDROCODONE-ACETAMINOPHEN 5-325 MG PO TABS: 1.0000 | ORAL_TABLET | Freq: Four times a day (QID) | ORAL | Status: DC | PRN

## 2015-03-16 MED ORDER — HYDROMORPHONE HCL 1 MG/ML IJ SOLN
INTRAMUSCULAR | Status: AC
  Filled 2015-03-16: qty 1

## 2015-03-16 MED ORDER — TRAMADOL HCL 50 MG PO TABS: 50.0000 mg | ORAL_TABLET | Freq: Two times a day (BID) | ORAL | Status: DC | PRN

## 2015-03-16 MED ORDER — DOXERCALCIFEROL 4 MCG/2ML IV SOLN
INTRAVENOUS | Status: AC
  Administered 2015-03-16: 4 ug via INTRAVENOUS
  Filled 2015-03-16: qty 2

## 2015-03-16 MED ORDER — LANTHANUM CARBONATE 1000 MG PO CHEW: 2000.0000 mg | CHEWABLE_TABLET | Freq: Three times a day (TID) | ORAL | Status: DC

## 2015-03-16 NOTE — Progress Notes (Signed)
Report called to this RN per Belenda CruiseKristin following dialysis - information included in verbal report including stating her lunch AC CBG = 119.

## 2015-03-16 NOTE — Progress Notes (Signed)
Spoke with husband several times to update on patient status.  Husband concerned whether patient would discharge today or tomorrow and if [facility] would transport patient to [facility] due to cost and wheel chair in patient room.  Husband is aware that wife is still sick and need to stay overnight to be nausea under control, get IV fluids and get xray of abdomin. Patient aware and attempting to rest. Pt resting with call bell within reach.  Will continue to monitor. Thomas HoffBurton, [place]ock, RN

## 2015-03-16 NOTE — Progress Notes (Signed)
OT Cancellation Note  Patient Details Name: [name] MRN: [id number] DOB: 1960/08/16   Cancelled Treatment:    Reason Eval/Treat Not Completed: OT screened, no acute needs identified, will sign off. Pt from SNF and plan is to return to SNF.  Earlie RavelingStraub, Daleysa Kristiansen L OTR/L [phone] 03/16/2015, 8:01 AM

## 2015-03-16 NOTE — NC FL2 (Signed)
[place] MEDICAID FL2 LEVEL OF CARE SCREENING TOOL     IDENTIFICATION  Patient Name: [name] Birthdate: [birth year] Sex: female Admission Date (Current Location): 03/13/2015  [facility] and [place] Number:     Facility and Address:  [facility], [address] [place], [place] [address]      Provider Number: [id number]  Attending Physician Name and Address:  Hollice Espy, MD  Relative Name and Phone Number:       Current Level of Care: Hospital Recommended Level of Care: Skilled Nursing Facility Prior Approval Number:    Date Approved/Denied:   PASRR Number:    Discharge Plan: SNF    Current Diagnoses: Patient Active Problem List   Diagnosis Date Noted  . 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 Depression 03/13/2015  . Prolonged Q-T interval on ECG 03/13/2015  . Type 2 diabetes mellitus, controlled, with renal complications (HCC) 12/12/2014  . Cellulitis and abscess of foot, except toes   . Type II or unspecified type diabetes mellitus with other specified manifestations, not stated as uncontrolled   . End stage renal disease (HCC)   . Unspecified essential hypertension   . Unspecified local infection of skin and subcutaneous tissue   . Renal dialysis status(V45.11)   . Kidney dialysis status   . Diabetic foot infection (HCC) 12/11/2014  . Foot ulcer (HCC) 12/10/2014  . Cellulitis of right foot 12/10/2014  . Diabetes mellitus type 2, controlled (HCC) 12/10/2014  . Chronic anemia 12/10/2014  . Ulcer of lower limb, unspecified 01/22/2013  . Mechanical complication of other vascular device, implant, and graft 05/01/2012  . Altered mental status 03/19/2012  . Seizure-like activity (HCC) 03/19/2012  . Severe sepsis(995.92) 01/24/2012  . Thrombocytopenia (HCC) 01/24/2012  . Respiratory failure (HCC) 01/23/2012  . Septic shock(785.52) 01/22/2012  . Encephalopathy acute 01/22/2012  .  Uncontrolled diabetes mellitus (HCC) 03/01/2011  . HTN (hypertension) 03/01/2011  . Anemia 03/01/2011  . ESRD (end stage renal disease) on dialysis (HCC) 03/01/2011  . Obesities, morbid (HCC) 03/01/2011  . Osteomyelitis (HCC) 03/01/2011  . Secondary hyperparathyroidism (of renal origin) 03/01/2011  . Complication of vascular access for dialysis 03/01/2011    Orientation ACTIVITIES/SOCIAL BLADDER RESPIRATION    Self, Time, Place    Continent Normal  BEHAVIORAL SYMPTOMS/MOOD NEUROLOGICAL BOWEL NUTRITION STATUS      Continent    PHYSICIAN VISITS COMMUNICATION OF NEEDS Height & Weight Skin    Verbally  (162.6 cm) 167 lbs. Surgical wounds          AMBULATORY STATUS RESPIRATION    Assist extensive Normal      Personal Care Assistance Level of Assistance  Bathing, Feeding, Dressing Bathing Assistance: Limited assistance Feeding assistance: Limited assistance Dressing Assistance: Limited assistance      Functional Limitations Info                SPECIAL CARE FACTORS FREQUENCY                      Additional Factors Info  Code Status, Allergies Code Status Info: FULL Allergies Info: Codeine, Lovoflaxcin, penicillins,            Current Medications (03/16/2015): Current Facility-Administered Medications  Medication Dose Route Frequency Provider Last Rate Last Dose  . 0.9 %  sodium chloride infusion   Intravenous Continuous Raymond Gurney, PA-[name] 10 mL/hr at 03/13/15 1030    . acetaminophen (TYLENOL) tablet 325-650 mg  325-650 mg Oral Q4H PRN Raymond Gurney, PA-[name]       Or  . acetaminophen (TYLENOL) suppository 325-650 mg  325-650 mg Rectal Q4H PRN Raymond Gurney, PA-[name]      . antiseptic oral rinse (CPC / CETYLPYRIDINIUM CHLORIDE 0.05%) solution 7 mL  7 mL Mouth Rinse BID Leatha Gilding, MD   7 mL at 03/15/15 2140  . docusate sodium (COLACE) capsule 100 mg  100 mg Oral Daily Raymond Gurney, PA-[name]   100 mg at 03/15/15 1125  . doxercalciferol  (HECTOROL) 4 MCG/2ML injection           . doxercalciferol (HECTOROL) injection 4 mcg  4 mcg Intravenous Q M,W,F-HD Costin Otelia Sergeant, MD   4 mcg at 03/14/15 1025  . feeding supplement (ENSURE ENLIVE) (ENSURE ENLIVE) liquid 237 mL  237 mL Oral QPM Kimberly A Trinh, PA-[name]   237 mL at 03/15/15 1800  . guaiFENesin-dextromethorphan (ROBITUSSIN DM) 100-10 MG/5ML syrup 15 mL  15 mL Oral Q4H PRN Raymond Gurney, PA-[name]      . heparin injection 5,000 Units  5,000 Units Subcutaneous 3 times per day Leatha Gilding, MD   5,000 Units at 03/16/15 5784  . hydrALAZINE (APRESOLINE) injection 5 mg  5 mg Intravenous Q20 Min PRN Raymond Gurney, PA-[name]      . HYDROcodone-acetaminophen (NORCO/VICODIN) 5-325 MG per tablet 1-2 tablet  1-2 tablet Oral Q4H PRN Raymond Gurney, PA-[name]   2 tablet at 03/15/15 2135  . HYDROmorphone (DILAUDID) injection 0.5-1 mg  0.5-1 mg Intravenous Q2H PRN Meredith Pel, NP   0.5 mg at 03/15/15 2337  . insulin aspart (novoLOG) injection 0-10 Units  0-10 Units Subcutaneous Once per day on Mon Wed Fri Raymond Gurney, PA-[name]   4 Units at 03/13/15 1645  . insulin detemir (LEVEMIR) injection 18 Units  18 Units Subcutaneous QHS Raymond Gurney, PA-[name]   18 Units at 03/15/15 2139  . labetalol (NORMODYNE,TRANDATE) injection 10 mg  10 mg Intravenous Q10 min PRN Raymond Gurney, PA-[name]      . lanthanum (FOSRENOL) chewable tablet 2,000 mg  2,000 mg Oral TID WC Raymond Gurney, PA-[name]   2,000 mg at 03/15/15 1723  . loratadine (CLARITIN) tablet 10 mg  10 mg Oral Daily Raymond Gurney, PA-[name]   10 mg at 03/15/15 1125  . magnesium sulfate IVPB 2 g 50 mL  2 g Intravenous Daily PRN Raymond Gurney, PA-[name]      . meclizine (ANTIVERT) tablet 25 mg  25 mg Oral TID PRN Raymond Gurney, PA-[name]      . metoprolol (LOPRESSOR) injection 2-5 mg  2-5 mg Intravenous Q2H PRN Raymond Gurney, PA-[name]      . metoprolol tartrate (LOPRESSOR) tablet 12.5 mg  12.5 mg Oral 2 times per day on Sun Tue Thu Sat Raymond Gurney, PA-[name]   12.5  mg at 03/15/15 1405  . morphine 2 MG/ML injection 2-5 mg  2-5 mg Intravenous Q1H PRN Raymond Gurney, PA-[name]      . multivitamin (RENA-VIT) tablet 1 tablet  1 tablet Oral QHS Weston Settle, PA-[name]   1 tablet at 03/15/15 2137  . ondansetron (ZOFRAN) tablet 4 mg  4 mg Oral Q6H PRN Meredith Pel, NP       Or  . ondansetron [facility]) injection 4 mg  4 mg Intravenous Q6H PRN Meredith Pel, NP   4 mg at 03/16/15 0501  . pantoprazole (PROTONIX) EC  tablet 40 mg  40 mg Oral Q12H Raymond GurneyKimberly A Trinh, PA-[name]   40 mg at 03/15/15 2137  . phenol (CHLORASEPTIC) mouth spray 1 spray  1 spray Mouth/Throat PRN Raymond GurneyKimberly A Trinh, PA-[name]      . sodium chloride 0.9 % injection 3 mL  3 mL Intravenous Q12H Meredith PelPaula M Guenther, NP   3 mL at 03/15/15 2140  . venlafaxine XR (EFFEXOR-XR) 24 hr capsule 225 mg  225 mg Oral Q breakfast Raymond GurneyKimberly A Trinh, PA-[name]   225 mg at 03/15/15 0630   Do not use this list as official medication orders. Please verify with discharge summary.  Discharge Medications:   Medication List    STOP taking these medications        collagenase ointment  Commonly known as:  SANTYL      TAKE these medications        ENSURE CLEAR PO  Take 1 Can by mouth every evening.     guaiFENesin-codeine 100-10 MG/5ML syrup  Commonly known as:  ROBITUSSIN AC  Take 30 mLs by mouth every 6 (six) hours as needed for cough.     HYDROcodone-acetaminophen 5-325 MG tablet  Commonly known as:  NORCO/VICODIN  Take 1-2 tablets by mouth every 6 (six) hours as needed for moderate pain.     insulin aspart 100 UNIT/ML injection  Commonly known as:  novoLOG  Inject 0-10 Units into the skin 3 (three) times a week. Mondays, Wednesdays and Fridays     insulin detemir 100 UNIT/ML injection  Commonly known as:  LEVEMIR  Inject 18 Units into the skin at bedtime.     lanthanum 1000 MG chewable tablet  Commonly known as:  FOSRENOL  Chew 2 tablets (2,000 mg total) by mouth 3 (three) times daily with meals.     loratadine 10  MG tablet  Commonly known as:  CLARITIN  Take 10 mg by mouth daily.     meclizine 25 MG tablet  Commonly known as:  ANTIVERT  Take 25 mg by mouth 3 (three) times daily as needed for dizziness.     Melatonin 5 MG Tabs  Take 5 mg by mouth at bedtime.     metoCLOPramide 5 MG tablet  Commonly known as:  REGLAN  Take 5 mg by mouth 4 (four) times daily.     metoprolol tartrate 25 MG tablet  Commonly known as:  LOPRESSOR  Take 12.5 mg by mouth See admin instructions. 12.5mg  twice daily on Sunday, Tuesday, Thursday, Saturday     multivitamins with iron Tabs tablet  Take 1 tablet by mouth daily.     pantoprazole 40 MG tablet  Commonly known as:  PROTONIX  Take 40 mg by mouth every 12 (twelve) hours.     traMADol 50 MG tablet  Commonly known as:  ULTRAM  Take 1 tablet (50 mg total) by mouth every 12 (twelve) hours as needed for moderate pain.     venlafaxine XR 75 MG 24 hr capsule  Commonly known as:  EFFEXOR-XR  Take 225 mg by mouth daily with breakfast.        Relevant Imaging Results:  Relevant Lab Results:  Recent Labs    Additional Information    [name], Bonnie Souders C, LCSW

## 2015-03-16 NOTE — Discharge Planning (Addendum)
Patient will discharge today per MD order. Patient will discharge to [facility]return to[facility] RN to call report prior to transportation to: [phone] Transportation: [facility] [phone]  CSW sent discharge summary to SNF for review.  Packet is complete.  RN, patient and family aware of discharge plans.  Marcelline Deistmily Ariannie Penaloza, LCSW [phone] Orthopedics: [id number] Surgical: [phone]

## 2015-03-16 NOTE — Progress Notes (Signed)
C/O nausea with dry heaves. States smelling breakfast increased nausea. Zofran 4 mg IV @ 0501 currently not effective. Tx paged Dr. Rito EhrlichKrishnan to request additional Rx for nausea. Removed breakfast - was eating small amount of grits.

## 2015-03-16 NOTE — Procedures (Signed)
Patient was seen on dialysis and the procedure was supervised. BFR 400 Via LUE AVF BP is 168/58.  Patient appears to be tolerating treatment well and will challenge with 1L UF due to HTN and trace edema.

## 2015-03-16 NOTE — Clinical Social Work Note (Signed)
Clinical Social Work Assessment  Patient Details  Name: [name] A Krupinski MRN: [id number] Date of Birth: Nov 03, 1960  Date of referral:  03/16/15               Reason for consult:   (from facility)                Permission sought to share information with:  Facility Industrial/product designerContact Representative Permission granted to share information::  Yes, Verbal Permission Granted  Name::      (husband)  Agency::     Relationship::     Contact Information:     Housing/Transportation Living arrangements for the past 2 months:  Skilled Nursing Facility [facility] Source of Information:  Patient, Spouse Patient Interpreter Needed:  None Criminal Activity/Legal Involvement Pertinent to Current Situation/Hospitalization:  No - Comment as needed Significant Relationships:    Lives with:  Spouse Do you feel safe going back to the place where you live?  Yes Need for family participation in patient care:  Yes (Comment) (patient requests husband to be involved)  Care giving concerns:  none   Office managerocial Worker assessment / plan:  Patient wishes for CSW to discuss discharge plans with husband.  CSW contacted husband who works at [employer].  Husband is agreeable for patient to [facility] at time of discharge.  Employment status:  Disabled (Comment on whether or not currently receiving Disability) Insurance information:    PT Recommendations:  Skilled Nursing Facility Information / Referral to community resources:  Skilled Nursing Facility  Patient/Family's Response to care:  agreeable  Patient/Family's Understanding of and Emotional Response to Diagnosis, Current Treatment, and Prognosis:  Husband is realistic regarding patient's prognosis.  Emotional Assessment Appearance:  Appears older than stated age Attitude/Demeanor/Rapport:    Affect (typically observed):  Accepting, Adaptable Orientation:    Alcohol / Substance use:  Not Applicable Psych involvement (Current and /or in the  community):  No (Comment)  Discharge Needs  Concerns to be addressed:  No discharge needs identified Readmission within the last 30 days:    Current discharge risk:  None Barriers to Discharge:  No Barriers Identified   Rondel Batonngle, Alanna Storti C, LCSW 03/16/2015, 10:19 AM

## 2015-03-16 NOTE — Discharge Summary (Signed)
Discharge Summary  [name] BJY:[id number] DOB: 1960/06/11  PCP: Irena CordsOLADONATO,JOSEPH A, MD  Admit date: 03/13/2015 Discharge date: 03/16/2015  Time spent: 25 minutes  Recommendations for Outpatient Follow-up:  1. Vascular surgery will call patient to set up follow-up appointment in 4 weeks. 2. [facility]   Discharge Diagnoses:  Active Hospital Problems   Diagnosis Date Noted  . 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 Depression 03/13/2015  . Prolonged Q-T interval on ECG 03/13/2015  . Uncontrolled diabetes mellitus (HCC) 03/01/2011  . ESRD (end stage renal disease) on dialysis (HCC) 03/01/2011  . HTN (hypertension) 03/01/2011    Resolved Hospital Problems   Diagnosis Date Noted Date Resolved  No resolved problems to display.    Discharge Condition: Improved, being discharged back to skilled nursing facility  Diet recommendation: Heart healthy  Filed Weights   03/15/15 0455 03/16/15 0504 03/16/15 0832  Weight: 77.2 kg (170 lb 3.1 oz) 76 kg (167 lb 8.8 oz) 76.2 kg (167 lb 15.9 oz)    History of present illness:  54 year old female with past medical history of end-stage renal disease, peripheral vascular disease, diabetes mellitus and previous multiple limb amputation admitted on 11/18 for right AKA done that same day.  Hospital Course:  Active Problems:   Uncontrolled diabetes mellitus (HCC):pt continued on home dose of Levemir plus sliding scale. CBGs remained stable during this hospitalization    HTN (hypertension): The pressures stable. Patient continued on home medications.   ESRD (end stage renal disease) on dialysis [facility](HCC): Seen by nephrology. Patient underwent dialysis on 11/19 and day of discharge, 11/21. 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 No  complications. Being followed by vascular surgery and cleared. Outpatient follow-up appointment in 4 weeks   Depression   Prolonged Q-T interval on ECG: Chronic issue. During this hospitalization, avoided medications that could prolong this.    Consultants:  Vascular surgery  Nephrology  Procedures:  Status post right AKA done 11/18  Hemodialysis done 11/19 and 11/21  Discharge Exam: BP 159/51 mmHg  Pulse 99  Temp(Src) 98.2 F (36.8 C) (Oral)  Resp 15  Ht 5\' 4"  (1.626 m)  Wt 76.2 kg (167 lb 15.9 oz)  BMI 28.82 kg/m2  SpO2 95%  General: alert and oriented 3 Cardiovascular: regular rate and rhythm, S1-S2 Respiratory: clear to auscultation bilaterally  Discharge Instructions You were cared for by a hospitalist during your hospital stay. If you have any questions about your discharge medications or the care you received while you were in the hospital after you are discharged, you can call the unit and asked to speak with the hospitalist on call if the hospitalist that took care of you is not available. Once you are discharged, your primary care physician will handle any further medical issues. Please note that NO REFILLS for any discharge medications will be authorized once you are discharged, as it is imperative that you return to your primary care physician (or establish a relationship with a primary care physician if you do not have one) for your aftercare needs so that they can reassess your need for medications and monitor your lab values.  Discharge Instructions    Diet - low sodium heart healthy    Complete by:  As directed      Increase activity slowly    Complete by:  As directed             Medication List    STOP taking these medications        collagenase ointment  Commonly known as:  SANTYL      TAKE these medications        ENSURE CLEAR PO  Take 1 Can by mouth every evening.     guaiFENesin-codeine 100-10 MG/5ML syrup  Commonly known as:   ROBITUSSIN AC  Take 30 mLs by mouth every 6 (six) hours as needed for cough.     HYDROcodone-acetaminophen 5-325 MG tablet  Commonly known as:  NORCO/VICODIN  Take 1-2 tablets by mouth every 6 (six) hours as needed for moderate pain.     insulin aspart 100 UNIT/ML injection  Commonly known as:  novoLOG  Inject 0-10 Units into the skin 3 (three) times a week. Mondays, Wednesdays and Fridays     insulin detemir 100 UNIT/ML injection  Commonly known as:  LEVEMIR  Inject 18 Units into the skin at bedtime.     lanthanum 1000 MG chewable tablet  Commonly known as:  FOSRENOL  Chew 2 tablets (2,000 mg total) by mouth 3 (three) times daily with meals.     loratadine 10 MG tablet  Commonly known as:  CLARITIN  Take 10 mg by mouth daily.     meclizine 25 MG tablet  Commonly known as:  ANTIVERT  Take 25 mg by mouth 3 (three) times daily as needed for dizziness.     Melatonin 5 MG Tabs  Take 5 mg by mouth at bedtime.     metoCLOPramide 5 MG tablet  Commonly known as:  REGLAN  Take 5 mg by mouth 4 (four) times daily.     metoprolol tartrate 25 MG tablet  Commonly known as:  LOPRESSOR  Take 12.5 mg by mouth See admin instructions. 12.5mg  twice daily on Sunday, Tuesday, Thursday, Saturday     multivitamins with iron Tabs tablet  Take 1 tablet by mouth daily.     pantoprazole 40 MG tablet  Commonly known as:  PROTONIX  Take 40 mg by mouth every 12 (twelve) hours.     traMADol 50 MG tablet  Commonly known as:  ULTRAM  Take 1 tablet (50 mg total) by mouth every 12 (twelve) hours as needed for moderate pain.     venlafaxine XR 75 MG 24 hr capsule  Commonly known as:  EFFEXOR-XR  Take 225 mg by mouth daily with breakfast.       Allergies  Allergen Reactions  . Codeine Hives  . Levofloxacin Other (See Comments)    unknown  . Penicillins Hives       Follow-up Information    Follow up with Josephina Gip, MD In 4 weeks.   Specialties:  Vascular Surgery, Interventional  Cardiology, Cardiology   Why:  Office will call you to arrange your appt (sent)   Contact information:   [address] [place] [address] [phone]        The results of significant diagnostics from this hospitalization (including imaging, microbiology, ancillary and laboratory) are listed below for reference.    Significant Diagnostic Studies: No results found.  Microbiology: No results found for this or any previous visit (from the past 240 hour(s)).   Labs: Basic Metabolic Panel:  Recent Labs Lab 03/13/15 0657 03/13/15 1135 03/14/15 0256 03/15/15 0230 03/16/15 0844  NA 136  --  137 135 134*  K 3.6  --  4.3 3.7 3.7  CL  --   --  95* 96* 94*  CO2  --   --  GLUCOSE 196*  --  138* 169* 119*  BUN  --   --  27* 15 28*  CREATININE  --  5.23* 6.14* 3.98* 5.92*  CALCIUM  --   --  9.2 9.0 9.2  PHOS  --   --  6.7*  --  5.1*   Liver Function Tests:  Recent Labs Lab 03/14/15 0256 03/16/15 0844  ALBUMIN 2.8* 2.8*   No results for input(s): LIPASE, AMYLASE in the last 168 hours. No results for input(s): AMMONIA in the last 168 hours. CBC:  Recent Labs Lab 03/13/15 0657 03/13/15 1135 03/14/15 0256 03/15/15 0230 03/16/15 0843  WBC  --  8.7 9.1 10.0 11.3*  HGB 11.6* 10.7* 9.9* 9.8* 10.0*  HCT 34.0* 33.5* 33.1* 32.7* 31.4*  MCV  --  95.2 97.9 98.8 95.4  PLT  --  177 190 159 163   Cardiac Enzymes: No results for input(s): CKTOTAL, CKMB, CKMBINDEX, TROPONINI in the last 168 hours. BNP: BNP (last 3 results) No results for input(s): BNP in the last 8760 hours.  ProBNP (last 3 results) No results for input(s): PROBNP in the last 8760 hours.  CBG:  Recent Labs Lab 03/15/15 0616 03/15/15 1126 03/15/15 1620 03/15/15 2117 03/16/15 0625  GLUCAP 134* 179* 148* 116* 104*       Signed:  Mani Celestin K  [facility] 03/16/2015, 9:50 AM

## 2015-03-16 NOTE — Progress Notes (Signed)
Patient ID: [name], female   DOB: 07-Jan-1961, 54 y.o.   MRN: [id number] Vascular Surgery Progress Note  Subjective: 3 days post right above-the-knee amputation. Patient denies pain or numbness. No phantom pain at present time. Patient does have nausea this morning. To go to hemodialysis in the few minutes. [facility].  Objective:  Filed Vitals:   03/15/15 2300 03/16/15 0504  BP: 104/72 155/42  Pulse:  97  Temp:  98.4 F (36.9 C)  Resp:  16    General alert and oriented 3 Right above-knee dictation stump examined and healing nicely with no evidence of infection or drainage.   Labs:  Recent Labs Lab 03/13/15 1135 03/14/15 0256 03/15/15 0230  CREATININE 5.23* 6.14* 3.98*    Recent Labs Lab 03/13/15 0657 03/13/15 1135 03/14/15 0256 03/15/15 0230  NA 136  --  137 135  K 3.6  --  4.3 3.7  CL  --   --  95* 96*  CO2  --   --  31 29  BUN  --   --  27* 15  CREATININE  --  5.23* 6.14* 3.98*  GLUCOSE 196*  --  138* 169*  CALCIUM  --   --  9.2 9.0    Recent Labs Lab 03/13/15 1135 03/14/15 0256 03/15/15 0230  WBC 8.7 9.1 10.0  HGB 10.7* 9.9* 9.8*  HCT 33.5* 33.1* 32.7*  PLT 177 190 159   No results for input(s): INR in the last 168 hours.  I/O last 3 completed shifts: In: 240 [P.O.:240] Out: 10 [Drains:10]  Imaging: No results found.  Assessment/Plan:  POD #3  LOS: 3 days  s/p Procedure(s): RIGHT  ABOVE KNEE AMPUTATION  Right above-knee dictation healing nicely [facility] Return to see me in 3 weeks for examination of stump and removal of skin staples   Josephina GipJames Lawson, MD 03/16/2015 7:57 AM

## 2015-03-16 NOTE — Progress Notes (Signed)
This RN notified [facility] - spoke with her nurse, informed will not be returning today d/t N/V. Will remain for IV fluids and to anticipate her return to [facility] tomorrow, 03/17/15.

## 2015-03-16 NOTE — Care Management Important Message (Signed)
Important Message  Patient Details  Name: [name] MRN: [id number] Date of Birth: 27-Nov-1960   Medicare Important Message Given:  Yes    Kyla BalzarineShealy, Yeslin Delio Abena 03/16/2015, 10:16 AM

## 2015-03-17 ENCOUNTER — Telehealth: Payer: Self-pay | Admitting: Vascular Surgery

## 2015-03-17 DIAGNOSIS — R112 Nausea with vomiting, unspecified: Secondary | ICD-10-CM

## 2015-03-17 LAB — GLUCOSE, CAPILLARY
Glucose-Capillary: 72 mg/dL (ref 65–99)
Glucose-Capillary: 73 mg/dL (ref 65–99)

## 2015-03-17 NOTE — Progress Notes (Signed)
[facility]  Subjective  - Still complaints of pain, but medication helps.   Objective 145/84 90 98.4 F (36.9 C) (Oral) 17 100%  Intake/Output Summary (Last 24 hours) at 03/17/15 0748 Last data filed at 03/16/15 1220  Gross per 24 hour  Intake    240 ml  Output    608 ml  Net   -368 ml    Right AKA healing well no erythema or drainage Staples intact  Assessment/Planning: POD # 4 Right AKA Pending discharge to SNF F/U for staple removal 3 weeks after discharge   [name], [name] [facility] 03/17/2015 7:48 AM --  Laboratory Lab Results:  Recent Labs  03/15/15 0230 03/16/15 0843  WBC 10.0 11.3*  HGB 9.8* 10.0*  HCT 32.7* 31.4*  PLT 159 163   BMET  Recent Labs  03/15/15 0230 03/16/15 0844  NA 135 134*  K 3.7 3.7  CL 96* 94*  CO2 29 25  GLUCOSE 169* 119*  BUN 15 28*  CREATININE 3.98* 5.92*  CALCIUM 9.0 9.2    COAG Lab Results  Component Value Date   INR 1.17 02/25/2012   INR 1.09 03/15/2011   INR 1.13 03/15/2011   No results found for: PTT

## 2015-03-17 NOTE — Telephone Encounter (Signed)
LM for pt re appt, dpm °

## 2015-03-17 NOTE — Progress Notes (Signed)
Nursing Tech and RN x 2 prepared patient for d/c, sliding board for transfer to her w/c. [facility] to [facility]F.

## 2015-03-17 NOTE — Discharge Summary (Signed)
Discharge Summary  [name] ZOX:[id number] DOB: June 04, 1960  PCP: Irena Cords, MD  Admit date: 03/13/2015 Discharge date: 03/17/2015  Time spent: 25 minutes  Recommendations for Outpatient Follow-up:  1. Vascular surgery will call patient to set up follow-up appointment in 4 weeks. 2. Patient being discharged back to [facility] health care facility   Discharge Diagnoses:  Active Hospital Problems   Diagnosis Date Noted  . 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 Depression 03/13/2015  . Prolonged Q-T interval on ECG 03/13/2015  . Uncontrolled diabetes mellitus (HCC) 03/01/2011  . ESRD (end stage renal disease) on dialysis (HCC) 03/01/2011  . HTN (hypertension) 03/01/2011    Resolved Hospital Problems   Diagnosis Date Noted Date Resolved  No resolved problems to display.    Discharge Condition: Improved, being discharged back to skilled nursing facility  Diet recommendation: Heart healthy  Filed Weights   03/16/15 0504 03/16/15 0832 03/16/15 1220  Weight: 76 kg (167 lb 8.8 oz) 76.2 kg (167 lb 15.9 oz) 75.8 kg (167 lb 1.7 oz)    History of present illness:  54 year old female with past medical history of end-stage renal disease, peripheral vascular disease, diabetes mellitus and previous multiple limb amputation admitted on 11/18 for right AKA done that same day.  Hospital Course:  Active Problems:   Uncontrolled diabetes mellitus (HCC):pt continued on home dose of Levemir plus sliding scale. CBGs remained stable during this hospitalization    HTN (hypertension): The pressures stable. Patient continued on home medications.   ESRD (end stage renal disease) on dialysis [facility]): Seen by nephrology. Patient underwent dialysis on 11/19 and day of discharge, 11/21. 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 No  complications. Being followed by vascular surgery and cleared. Outpatient follow-up appointment in 4 weeks   Depression   Prolonged Q-T interval on ECG: Chronic issue. During this hospitalization, avoided medications that could prolong this.   Nausea and vomiting: Patient initially scheduled for discharge on 11/21. Started having this acute intractable nausea and vomiting. This is felt to be a combination of not eating anything, undergoing dialysis and medication for pain. Following some additional IV fluids and continued anti-medic medications, patient's symptoms resolved overnight and by 11/22 she is doing much better and cleared for discharge.   Consultants:  Vascular surgery  Nephrology  Procedures:  Status post right AKA done 11/18  Hemodialysis done 11/19 and 11/21  Discharge Exam: BP 145/84 mmHg  Pulse 90  Temp(Src) 98.4 F (36.9 C) (Oral)  Resp 17  Ht  (1.626 m)  Wt 75.8 kg (167 lb 1.7 oz)  BMI 28.67 kg/m2  SpO2 100%  General: alert and oriented 3 Cardiovascular: regular rate and rhythm, S1-S2 Respiratory: clear to auscultation bilaterally  Discharge Instructions You were cared for by a hospitalist during your hospital stay. If you have any questions about your discharge medications or the care you received while you were in the hospital after you are discharged, you can call the unit and asked to speak with the hospitalist on call if the hospitalist that took care of you is not available. Once you are discharged, your primary care physician will handle any further medical issues. Please note that NO REFILLS for any discharge medications will be authorized once you are discharged, as it is imperative that you return to your primary care physician (or establish a relationship with a  primary care physician if you do not have one) for your aftercare needs so that they can reassess your need for medications and monitor your lab values.      Discharge Instructions     Diet - low sodium heart healthy    Complete by:  As directed      Increase activity slowly    Complete by:  As directed             Medication List    STOP taking these medications        collagenase ointment  Commonly known as:  SANTYL      TAKE these medications        ENSURE CLEAR PO  Take 1 Can by mouth every evening.     guaiFENesin-codeine 100-10 MG/5ML syrup  Commonly known as:  ROBITUSSIN AC  Take 30 mLs by mouth every 6 (six) hours as needed for cough.     HYDROcodone-acetaminophen 5-325 MG tablet  Commonly known as:  NORCO/VICODIN  Take 1-2 tablets by mouth every 6 (six) hours as needed for moderate pain.     insulin aspart 100 UNIT/ML injection  Commonly known as:  novoLOG  Inject 0-10 Units into the skin 3 (three) times a week. Mondays, Wednesdays and Fridays     insulin detemir 100 UNIT/ML injection  Commonly known as:  LEVEMIR  Inject 18 Units into the skin at bedtime.     lanthanum 1000 MG chewable tablet  Commonly known as:  FOSRENOL  Chew 2 tablets (2,000 mg total) by mouth 3 (three) times daily with meals.     loratadine 10 MG tablet  Commonly known as:  CLARITIN  Take 10 mg by mouth daily.     meclizine 25 MG tablet  Commonly known as:  ANTIVERT  Take 25 mg by mouth 3 (three) times daily as needed for dizziness.     Melatonin 5 MG Tabs  Take 5 mg by mouth at bedtime.     metoCLOPramide 5 MG tablet  Commonly known as:  REGLAN  Take 5 mg by mouth 4 (four) times daily.     metoprolol tartrate 25 MG tablet  Commonly known as:  LOPRESSOR  Take 12.5 mg by mouth See admin instructions. 12.5mg  twice daily on Sunday, Tuesday, Thursday, Saturday     multivitamins with iron Tabs tablet  Take 1 tablet by mouth daily.     pantoprazole 40 MG tablet  Commonly known as:  PROTONIX  Take 40 mg by mouth every 12 (twelve) hours.     traMADol 50 MG tablet  Commonly known as:  ULTRAM  Take 1 tablet (50 mg total) by mouth every 12 (twelve) hours as  needed for moderate pain.     venlafaxine XR 75 MG 24 hr capsule  Commonly known as:  EFFEXOR-XR  Take 225 mg by mouth daily with breakfast.       Allergies  Allergen Reactions  . Codeine Hives  . Levofloxacin Other (See Comments)    unknown  . Penicillins Hives   Follow-up Information    Follow up with Josephina Gip, MD In 4 weeks.   Specialties:  Vascular Surgery, Interventional Cardiology, Cardiology   Why:  Office will call you to arrange your appt (sent)   Contact information:   [address] [place] [address] [phone]        The results of significant diagnostics from this hospitalization (including imaging, microbiology, ancillary and laboratory) are listed below for reference.  Significant Diagnostic Studies: Dg Abd Portable 1v  03/16/2015  CLINICAL DATA:  Nausea and vomiting since dialysis earlier today. Initial encounter. EXAM: PORTABLE ABDOMEN - 1 VIEW COMPARISON:  Single view of the abdomen 02/17/2012. FINDINGS: The bowel gas pattern is normal. Contrast material is noted in the fundus of the stomach and in the colon. No radio-opaque calculi or other significant radiographic abnormality are seen. IMPRESSION: No acute abnormality. Electronically Signed   By: Drusilla Kannerhomas  Dalessio M.D.   On: 03/16/2015 16:59    Microbiology: No results found for this or any previous visit (from the past 240 hour(s)).   Labs: Basic Metabolic Panel:  Recent Labs Lab 03/13/15 0657 03/13/15 1135 03/14/15 0256 03/15/15 0230 03/16/15 0844  NA 136  --  137 135 134*  K 3.6  --  4.3 3.7 3.7  CL  --   --  95* 96* 94*  CO2  --   --  31 29 25   GLUCOSE 196*  --  138* 169* 119*  BUN  --   --  27* 15 28*  CREATININE  --  5.23* 6.14* 3.98* 5.92*  CALCIUM  --   --  9.2 9.0 9.2  PHOS  --   --  6.7*  --  5.1*   Liver Function Tests:  Recent Labs Lab 03/14/15 0256 03/16/15 0844  ALBUMIN 2.8* 2.8*   No results for input(s): LIPASE, AMYLASE in the last 168 hours. No results  for input(s): AMMONIA in the last 168 hours. CBC:  Recent Labs Lab 03/13/15 0657 03/13/15 1135 03/14/15 0256 03/15/15 0230 03/16/15 0843  WBC  --  8.7 9.1 10.0 11.3*  HGB 11.6* 10.7* 9.9* 9.8* 10.0*  HCT 34.0* 33.5* 33.1* 32.7* 31.4*  MCV  --  95.2 97.9 98.8 95.4  PLT  --  177 190 159 163   Cardiac Enzymes: No results for input(s): CKTOTAL, CKMB, CKMBINDEX, TROPONINI in the last 168 hours. BNP: BNP (last 3 results) No results for input(s): BNP in the last 8760 hours.  ProBNP (last 3 results) No results for input(s): PROBNP in the last 8760 hours.  CBG:  Recent Labs Lab 03/15/15 2117 03/16/15 0625 03/16/15 1630 03/16/15 2107 03/17/15 0617  GLUCAP 116* 104* 129* 138* 72       Signed:  Sherrika Weakland K  [facility] 03/17/2015, 10:53 AM

## 2015-03-17 NOTE — Progress Notes (Signed)
Report called to RN at [facility].

## 2015-03-17 NOTE — Telephone Encounter (Signed)
-----   Message from Sharee PimpleMarilyn K McChesney, RN sent at 03/15/2015 11:05 PM EST ----- Regarding: schedule   ----- Message -----    From: Dara LordsSamantha J Rhyne, PA-C    Sent: 03/14/2015   9:11 AM      To: Vvs Charge Pool  S/p right AKA 03/13/15.  F/u with Dr. Hart RochesterLawson in 4 weeks.  Thanks, Lelon MastSamantha

## 2015-03-17 NOTE — Progress Notes (Signed)
Patient ID: [name], female   DOB: [birth year], 54 y.o.   MRN: [id number]  [facility] Progress Note    Subjective:   No new complaints   Objective:   BP 145/84 mmHg  Pulse 90  Temp(Src) 98.4 F (36.9 C) (Oral)  Resp 17  Ht 5\' 4"  (1.626 m)  Wt 75.8 kg (167 lb 1.7 oz)  BMI 28.67 kg/m2  SpO2 100%  Intake/Output: I/O last 3 completed shifts: In: 240 [P.O.:240] Out: 608 [Other:608]   Intake/Output this shift:    Weight change: 0.2 kg (7.1 oz)  Physical Exam: Gen:WD WF in NAd CVS:no rub Resp:cta WUJ:WJXBJYAbd:benign Ext:s/o R AKA incision C/D/staples intact, left BKA, LUE AVF +T/B  Labs: BMET  Recent Labs Lab 03/13/15 0657 03/13/15 1135 03/14/15 0256 03/15/15 0230 03/16/15 0844  NA 136  --  137 135 134*  K 3.6  --  4.3 3.7 3.7  CL  --   --  95* 96* 94*  CO2  --   --  31 29 25   GLUCOSE 196*  --  138* 169* 119*  BUN  --   --  27* 15 28*  CREATININE  --  5.23* 6.14* 3.98* 5.92*  ALBUMIN  --   --  2.8*  --  2.8*  CALCIUM  --   --  9.2 9.0 9.2  PHOS  --   --  6.7*  --  5.1*   CBC  Recent Labs Lab 03/13/15 1135 03/14/15 0256 03/15/15 0230 03/16/15 0843  WBC 8.7 9.1 10.0 11.3*  HGB 10.7* 9.9* 9.8* 10.0*  HCT 33.5* 33.1* 32.7* 31.4*  MCV 95.2 97.9 98.8 95.4  PLT 177 190 159 163    @IMGRELPRIORS @ Medications:    . antiseptic oral rinse  7 mL Mouth Rinse BID  . docusate sodium  100 mg Oral Daily  . doxercalciferol  4 mcg Intravenous Q M,W,F-HD  . feeding supplement (ENSURE ENLIVE)  237 mL Oral QPM  . heparin  5,000 Units Subcutaneous 3 times per day  . insulin aspart  0-10 Units Subcutaneous Once per day on Mon Wed Fri  . insulin detemir  18 Units Subcutaneous QHS  . lanthanum  2,000 mg Oral TID WC  . loratadine  10 mg Oral Daily  . metoprolol tartrate  12.5 mg Oral 2 times per day on Sun Tue Thu Sat  . multivitamin  1 tablet Oral QHS  . pantoprazole  40 mg Oral Q12H  . sodium chloride  3 mL Intravenous Q12H  . venlafaxine XR  225 mg  Oral Q breakfast   Dialysis Orders: [facility] 4 hr MWF 400/A 1.5 EDW 79 (new EDW 75kg following RAKA) 2K 2.25 Ca heparin 5100 Mircera 50 q two weeks-last 11/9 just resumed from 75 9/14 Hectorol 4 Recent labs: Hgb 9.6 declining , ferritin 2100 25% sat Ca/P ok iPTH 217  Assessment/ Plan:   1. PVD- s/p R AKA, [facility] 2. ESRD Cont with HD qMWF to go to outpt unit tomorrow will address new EDW  3. 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 Nutrition: cont protein supplementation 6. Hypertension:stable 7. Dispo- for discharge today and follow up with [facility] HD [facility].  Irena CordsJoseph A. Chardonay Scritchfield, MD [facility], [facility] Pager [phone] 03/17/2015, 11:24 AM

## 2015-04-07 ENCOUNTER — Encounter: Payer: Medicare PPO | Admitting: Vascular Surgery

## 2015-04-08 ENCOUNTER — Telehealth: Payer: Self-pay

## 2015-04-08 NOTE — Telephone Encounter (Addendum)
[facility]  Reported the right AKA stump is dry and intact with redness/ irritation, intermittently, along the incision line.  Denied any warmth, drainage, or fever/chills.  Nurse stated "it looks like the staples are ready to come out."   Pt. has appt. 04/21/15.  Advised will place pt. on cancellation list, to try to move appt. up one week.  Nurse agreed with plan.

## 2015-04-09 NOTE — Telephone Encounter (Signed)
Created a wait list recall.

## 2015-04-15 ENCOUNTER — Encounter: Payer: Self-pay | Admitting: Vascular Surgery

## 2015-04-21 ENCOUNTER — Encounter: Payer: Medicare PPO | Admitting: Vascular Surgery

## 2015-05-01 ENCOUNTER — Encounter: Payer: Self-pay | Admitting: Vascular Surgery

## 2015-05-05 ENCOUNTER — Encounter: Payer: Medicare PPO | Admitting: Vascular Surgery

## 2015-05-12 ENCOUNTER — Encounter: Payer: Self-pay | Admitting: Vascular Surgery

## 2015-05-19 ENCOUNTER — Encounter: Payer: Self-pay | Admitting: Vascular Surgery

## 2015-05-19 ENCOUNTER — Ambulatory Visit (INDEPENDENT_AMBULATORY_CARE_PROVIDER_SITE_OTHER): Payer: Medicaid Other | Admitting: Vascular Surgery

## 2015-05-19 VITALS — BP 119/69 | HR 94 | Temp 98.0°F | Resp 16 | Ht 63.0 in

## 2015-05-19 DIAGNOSIS — Z89619 Acquired absence of unspecified leg above knee: Secondary | ICD-10-CM | POA: Insufficient documentation

## 2015-05-19 DIAGNOSIS — Z89611 Acquired absence of right leg above knee: Secondary | ICD-10-CM

## 2015-05-19 NOTE — Progress Notes (Signed)
Subjective:     Patient ID: Gaetano Net, female   DOB: 1961/04/04, 55 y.o.   MRN: [id number]  HPI this 55 year old female returns for follow-up regarding her right above-knee amputation which I performed 03/13/2015 for gangrenous ulcers in the right foot which were non-reconstructable. She denies any pain at the amputation stump and has had no severe phantom pain. She does have some mild edema and the stump she states. She had previously undergone a left below-knee amputation which also is asymptomatic. Review of Systems     Objective:   Physical Exam BP 119/69 mmHg  Pulse 94  Temp(Src) 98 F (36.7 C)  Resp 16  Ht  (1.6 m)  SpO2 97%   chronically ill-appearing female in a wheelchair in no apparent distress alert and oriented 3 Lungs no rhonchi or wheezing Right AKA has healed nicely with no evidence of infection or poor healing. No point tenderness. No flexion contracture.    Assessment:     Status post right AKA for severe PAD.    Plan:    have given patient a prescription for stump shrinker if she would like to obtain this Return to see me on a when necessary basis

## 2015-06-11 ENCOUNTER — Encounter (HOSPITAL_COMMUNITY): Payer: Self-pay

## 2015-06-11 ENCOUNTER — Emergency Department (HOSPITAL_COMMUNITY): Payer: Medicare Other

## 2015-06-11 ENCOUNTER — Inpatient Hospital Stay (HOSPITAL_COMMUNITY)
Admission: EM | Admit: 2015-06-11 | Discharge: 2015-06-15 | DRG: 638 | Disposition: A | Discharge status: Skilled Nursing Facility | Payer: Medicare Other | Attending: Internal Medicine | Admitting: Internal Medicine

## 2015-06-11 DIAGNOSIS — N186 End stage renal disease: Secondary | ICD-10-CM | POA: Diagnosis present

## 2015-06-11 DIAGNOSIS — E162 Hypoglycemia, unspecified: Secondary | ICD-10-CM | POA: Diagnosis present

## 2015-06-11 DIAGNOSIS — I739 Peripheral vascular disease, unspecified: Secondary | ICD-10-CM | POA: Diagnosis present

## 2015-06-11 DIAGNOSIS — E11649 Type 2 diabetes mellitus with hypoglycemia without coma: Secondary | ICD-10-CM | POA: Diagnosis present

## 2015-06-11 DIAGNOSIS — E1122 Type 2 diabetes mellitus with diabetic chronic kidney disease: Secondary | ICD-10-CM | POA: Diagnosis present

## 2015-06-11 DIAGNOSIS — R9431 Abnormal electrocardiogram [ECG] [EKG]: Secondary | ICD-10-CM | POA: Diagnosis present

## 2015-06-11 DIAGNOSIS — I10 Essential (primary) hypertension: Secondary | ICD-10-CM | POA: Diagnosis present

## 2015-06-11 DIAGNOSIS — E8770 Fluid overload, unspecified: Secondary | ICD-10-CM | POA: Diagnosis present

## 2015-06-11 DIAGNOSIS — I120 Hypertensive chronic kidney disease with stage 5 chronic kidney disease or end stage renal disease: Secondary | ICD-10-CM | POA: Diagnosis present

## 2015-06-11 DIAGNOSIS — D649 Anemia, unspecified: Secondary | ICD-10-CM | POA: Diagnosis present

## 2015-06-11 DIAGNOSIS — F329 Major depressive disorder, single episode, unspecified: Secondary | ICD-10-CM | POA: Diagnosis not present

## 2015-06-11 DIAGNOSIS — Z23 Encounter for immunization: Secondary | ICD-10-CM | POA: Diagnosis not present

## 2015-06-11 DIAGNOSIS — Z992 Dependence on renal dialysis: Secondary | ICD-10-CM | POA: Diagnosis not present

## 2015-06-11 DIAGNOSIS — J9601 Acute respiratory failure with hypoxia: Secondary | ICD-10-CM | POA: Diagnosis present

## 2015-06-11 DIAGNOSIS — D696 Thrombocytopenia, unspecified: Secondary | ICD-10-CM | POA: Diagnosis present

## 2015-06-11 DIAGNOSIS — E1165 Type 2 diabetes mellitus with hyperglycemia: Secondary | ICD-10-CM | POA: Diagnosis present

## 2015-06-11 DIAGNOSIS — F0390 Unspecified dementia without behavioral disturbance: Secondary | ICD-10-CM | POA: Diagnosis present

## 2015-06-11 DIAGNOSIS — J811 Chronic pulmonary edema: Secondary | ICD-10-CM | POA: Diagnosis present

## 2015-06-11 DIAGNOSIS — F32A Depression, unspecified: Secondary | ICD-10-CM | POA: Diagnosis present

## 2015-06-11 HISTORY — DX: Unspecified dementia, unspecified severity, without behavioral disturbance, psychotic disturbance, mood disturbance, and anxiety: F03.90

## 2015-06-11 LAB — CBC WITH DIFFERENTIAL/PLATELET
Basophils Absolute: 0 K/uL (ref 0.0–0.1)
Basophils Relative: 0 %
Eosinophils Absolute: 0 K/uL (ref 0.0–0.7)
Eosinophils Relative: 0 %
HCT: 37.2 % (ref 36.0–46.0)
Hemoglobin: 12.2 g/dL (ref 12.0–15.0)
Lymphocytes Relative: 7 %
Lymphs Abs: 0.8 K/uL (ref 0.7–4.0)
MCH: 30.7 pg (ref 26.0–34.0)
MCHC: 32.8 g/dL (ref 30.0–36.0)
MCV: 93.5 fL (ref 78.0–100.0)
Monocytes Absolute: 0.4 K/uL (ref 0.1–1.0)
Monocytes Relative: 3 %
Neutro Abs: 10.7 K/uL — ABNORMAL HIGH (ref 1.7–7.7)
Neutrophils Relative %: 90 %
Platelets: 112 K/uL — ABNORMAL LOW (ref 150–400)
RBC: 3.98 MIL/uL (ref 3.87–5.11)
RDW: 16.3 % — ABNORMAL HIGH (ref 11.5–15.5)
WBC: 11.9 K/uL — ABNORMAL HIGH (ref 4.0–10.5)

## 2015-06-11 LAB — COMPREHENSIVE METABOLIC PANEL WITH GFR
ALT: 18 U/L (ref 14–54)
AST: 30 U/L (ref 15–41)
Albumin: 3.3 g/dL — ABNORMAL LOW (ref 3.5–5.0)
Alkaline Phosphatase: 96 U/L (ref 38–126)
Anion gap: 19 — ABNORMAL HIGH (ref 5–15)
BUN: 48 mg/dL — ABNORMAL HIGH (ref 6–20)
CO2: 27 mmol/L (ref 22–32)
Calcium: 9.1 mg/dL (ref 8.9–10.3)
Chloride: 92 mmol/L — ABNORMAL LOW (ref 101–111)
Creatinine, Ser: 6.86 mg/dL — ABNORMAL HIGH (ref 0.44–1.00)
GFR calc Af Amer: 7 mL/min — ABNORMAL LOW
GFR calc non Af Amer: 6 mL/min — ABNORMAL LOW
Glucose, Bld: 159 mg/dL — ABNORMAL HIGH (ref 65–99)
Potassium: 4 mmol/L (ref 3.5–5.1)
Sodium: 138 mmol/L (ref 135–145)
Total Bilirubin: 0.9 mg/dL (ref 0.3–1.2)
Total Protein: 7.5 g/dL (ref 6.5–8.1)

## 2015-06-11 LAB — CBG MONITORING, ED
GLUCOSE-CAPILLARY: 147 mg/dL — AB (ref 65–99)
GLUCOSE-CAPILLARY: 107 mg/dL — AB (ref 65–99)
GLUCOSE-CAPILLARY: 90 mg/dL (ref 65–99)
Glucose-Capillary: 90 mg/dL (ref 65–99)
Glucose-Capillary: 70 mg/dL (ref 65–99)
Glucose-Capillary: 75 mg/dL (ref 65–99)
Glucose-Capillary: 62 mg/dL — ABNORMAL LOW (ref 65–99)
Glucose-Capillary: 49 mg/dL — ABNORMAL LOW (ref 65–99)
Glucose-Capillary: 139 mg/dL — ABNORMAL HIGH (ref 65–99)
Glucose-Capillary: 92 mg/dL (ref 65–99)

## 2015-06-11 LAB — GLUCOSE, CAPILLARY
GLUCOSE-CAPILLARY: 46 mg/dL — AB (ref 65–99)
GLUCOSE-CAPILLARY: 221 mg/dL — AB (ref 65–99)
Glucose-Capillary: 62 mg/dL — ABNORMAL LOW (ref 65–99)
Glucose-Capillary: 69 mg/dL (ref 65–99)
Glucose-Capillary: 279 mg/dL — ABNORMAL HIGH (ref 65–99)

## 2015-06-11 LAB — URINE MICROSCOPIC-ADD ON

## 2015-06-11 LAB — URINALYSIS, ROUTINE W REFLEX MICROSCOPIC
Bilirubin Urine: NEGATIVE
Glucose, UA: 100 mg/dL — AB
Ketones, ur: NEGATIVE mg/dL
Nitrite: NEGATIVE
Protein, ur: >300 mg/dL — AB
Specific Gravity, Urine: 1.016 (ref 1.005–1.030)
pH: 7.5 (ref 5.0–8.0)

## 2015-06-11 LAB — I-STAT CG4 LACTIC ACID, ED: Lactic Acid, Venous: 1.54 mmol/L (ref 0.5–2.0)

## 2015-06-11 MED ORDER — HYDROCODONE-ACETAMINOPHEN 5-325 MG PO TABS
1.0000 | ORAL_TABLET | Freq: Four times a day (QID) | ORAL | Status: DC | PRN
  Administered 2015-06-13 – 2015-06-14 (×2): 1 via ORAL
  Filled 2015-06-11 (×2): qty 1

## 2015-06-11 MED ORDER — LANTHANUM CARBONATE 500 MG PO CHEW
2000.0000 mg | CHEWABLE_TABLET | Freq: Three times a day (TID) | ORAL | Status: DC
  Administered 2015-06-12 – 2015-06-15 (×9): 2000 mg via ORAL
  Filled 2015-06-11 (×8): qty 4

## 2015-06-11 MED ORDER — PANTOPRAZOLE SODIUM 40 MG PO TBEC
40.0000 mg | DELAYED_RELEASE_TABLET | Freq: Two times a day (BID) | ORAL | Status: DC
  Administered 2015-06-11 – 2015-06-15 (×9): 40 mg via ORAL
  Filled 2015-06-11 (×9): qty 1

## 2015-06-11 MED ORDER — DEXTROSE 10 % IV SOLN
INTRAVENOUS | Status: DC
  Administered 2015-06-11 – 2015-06-12 (×2): via INTRAVENOUS

## 2015-06-11 MED ORDER — TAB-A-VITE/IRON PO TABS
1.0000 | ORAL_TABLET | Freq: Every day | ORAL | Status: DC
  Administered 2015-06-12 – 2015-06-15 (×4): 1 via ORAL
  Filled 2015-06-11 (×7): qty 1

## 2015-06-11 MED ORDER — MECLIZINE HCL 25 MG PO TABS: 25.0000 mg | ORAL_TABLET | Freq: Three times a day (TID) | ORAL | Status: DC | PRN

## 2015-06-11 MED ORDER — ALTEPLASE 2 MG IJ SOLR
2.0000 mg | Freq: Once | INTRAMUSCULAR | Status: DC | PRN
  Filled 2015-06-11: qty 2

## 2015-06-11 MED ORDER — DEXTROSE 50 % IV SOLN
1.0000 | Freq: Once | INTRAVENOUS | Status: AC
  Administered 2015-06-11 (×2): 50 mL via INTRAVENOUS
  Filled 2015-06-11: qty 50

## 2015-06-11 MED ORDER — DEXTROSE 50 % IV SOLN
INTRAVENOUS | Status: AC
  Administered 2015-06-11: 50 mL via INTRAVENOUS
  Filled 2015-06-11: qty 50

## 2015-06-11 MED ORDER — LIDOCAINE HCL (PF) 1 % IJ SOLN: 5.0000 mL | INTRAMUSCULAR | Status: DC | PRN

## 2015-06-11 MED ORDER — METOCLOPRAMIDE HCL 5 MG PO TABS
5.0000 mg | ORAL_TABLET | Freq: Four times a day (QID) | ORAL | Status: DC
  Administered 2015-06-11 – 2015-06-15 (×12): 5 mg via ORAL
  Filled 2015-06-11 (×13): qty 1

## 2015-06-11 MED ORDER — SODIUM CHLORIDE 0.9 % IV SOLN: 100.0000 mL | INTRAVENOUS | Status: DC | PRN

## 2015-06-11 MED ORDER — GLUCOSE 40 % PO GEL
ORAL | Status: AC
  Administered 2015-06-11: 37.5 g
  Filled 2015-06-11: qty 1

## 2015-06-11 MED ORDER — DEXTROSE 50 % IV SOLN
INTRAVENOUS | Status: AC
  Administered 2015-06-11: 50 mL
  Filled 2015-06-11: qty 50

## 2015-06-11 MED ORDER — ENSURE ENLIVE PO LIQD
Freq: Every evening | ORAL | Status: DC
  Administered 2015-06-11: 18:00:00 via ORAL

## 2015-06-11 MED ORDER — DEXTROSE 50 % IV SOLN
INTRAVENOUS | Status: AC
  Filled 2015-06-11: qty 50

## 2015-06-11 MED ORDER — ONDANSETRON HCL 4 MG PO TABS: 4.0000 mg | ORAL_TABLET | Freq: Four times a day (QID) | ORAL | Status: DC | PRN

## 2015-06-11 MED ORDER — ACETAMINOPHEN 650 MG RE SUPP: 650.0000 mg | Freq: Four times a day (QID) | RECTAL | Status: DC | PRN

## 2015-06-11 MED ORDER — DEXTROSE 50 % IV SOLN
1.0000 | Freq: Once | INTRAVENOUS | Status: DC
  Filled 2015-06-11: qty 50

## 2015-06-11 MED ORDER — DEXTROSE 50 % IV SOLN
1.0000 | Freq: Once | INTRAVENOUS | Status: AC
  Administered 2015-06-11: 50 mL via INTRAVENOUS

## 2015-06-11 MED ORDER — PENTAFLUOROPROP-TETRAFLUOROETH EX AERO: 1.0000 "application " | INHALATION_SPRAY | CUTANEOUS | Status: DC | PRN

## 2015-06-11 MED ORDER — DOXERCALCIFEROL 4 MCG/2ML IV SOLN
1.0000 ug | Freq: Once | INTRAVENOUS | Status: DC
  Filled 2015-06-11: qty 2

## 2015-06-11 MED ORDER — NEPRO/CARBSTEADY PO LIQD
237.0000 mL | Freq: Two times a day (BID) | ORAL | Status: DC
  Administered 2015-06-12 – 2015-06-15 (×6): 237 mL via ORAL

## 2015-06-11 MED ORDER — IPRATROPIUM-ALBUTEROL 0.5-2.5 (3) MG/3ML IN SOLN
3.0000 mL | Freq: Once | RESPIRATORY_TRACT | Status: AC
  Administered 2015-06-11: 3 mL via RESPIRATORY_TRACT
  Filled 2015-06-11: qty 3

## 2015-06-11 MED ORDER — LIDOCAINE-PRILOCAINE 2.5-2.5 % EX CREA
1.0000 "application " | TOPICAL_CREAM | CUTANEOUS | Status: DC | PRN
  Filled 2015-06-11: qty 5

## 2015-06-11 MED ORDER — DOXERCALCIFEROL 4 MCG/2ML IV SOLN
1.0000 ug | INTRAVENOUS | Status: DC
  Administered 2015-06-15: 1 ug via INTRAVENOUS
  Filled 2015-06-11 (×2): qty 2

## 2015-06-11 MED ORDER — ONDANSETRON HCL 4 MG/2ML IJ SOLN: 4.0000 mg | Freq: Four times a day (QID) | INTRAMUSCULAR | Status: DC | PRN

## 2015-06-11 MED ORDER — LORATADINE 10 MG PO TABS
10.0000 mg | ORAL_TABLET | Freq: Every day | ORAL | Status: DC
  Administered 2015-06-11 – 2015-06-15 (×5): 10 mg via ORAL
  Filled 2015-06-11 (×5): qty 1

## 2015-06-11 MED ORDER — HEPARIN SODIUM (PORCINE) 5000 UNIT/ML IJ SOLN
5000.0000 [IU] | Freq: Three times a day (TID) | INTRAMUSCULAR | Status: DC
  Administered 2015-06-11 – 2015-06-15 (×13): 5000 [IU] via SUBCUTANEOUS
  Filled 2015-06-11 (×11): qty 1

## 2015-06-11 MED ORDER — ACETAMINOPHEN 325 MG PO TABS
650.0000 mg | ORAL_TABLET | Freq: Four times a day (QID) | ORAL | Status: DC | PRN
  Administered 2015-06-11: 650 mg via ORAL
  Filled 2015-06-11: qty 2

## 2015-06-11 MED ORDER — VENLAFAXINE HCL ER 75 MG PO CP24
225.0000 mg | ORAL_CAPSULE | Freq: Every day | ORAL | Status: DC
  Administered 2015-06-12 – 2015-06-15 (×4): 225 mg via ORAL
  Filled 2015-06-11 (×7): qty 1

## 2015-06-11 MED ORDER — HEPARIN SODIUM (PORCINE) 1000 UNIT/ML DIALYSIS: 20.0000 [IU]/kg | INTRAMUSCULAR | Status: DC | PRN

## 2015-06-11 MED ORDER — GLUCAGON HCL RDNA (DIAGNOSTIC) 1 MG IJ SOLR
INTRAMUSCULAR | Status: AC
  Administered 2015-06-11: 1 mg
  Filled 2015-06-11: qty 1

## 2015-06-11 MED ORDER — GUAIFENESIN 100 MG/5ML PO SOLN
20.0000 mL | Freq: Three times a day (TID) | ORAL | Status: DC
  Administered 2015-06-12 – 2015-06-15 (×12): 400 mg via ORAL
  Filled 2015-06-11 (×17): qty 20

## 2015-06-11 MED ORDER — HEPARIN SODIUM (PORCINE) 1000 UNIT/ML DIALYSIS: 1000.0000 [IU] | INTRAMUSCULAR | Status: DC | PRN

## 2015-06-11 NOTE — ED Notes (Signed)
Per EMS, pt from [facility]. Pt's sugar has dropped several times over the last several times and pt has received several glucagon shots. Tonight pt received  of glucagon in the last hour. Pt's CBG was 30 with facility staff and 50 with ems initially. Pt has 22ga in right forearm. Pt is right above knee amputee and left BKA. Left hand amputee. Pt is dialysis pt. Last CBG with EMS was 121. Pt alert to her baseline at this time. Pt has hx of dementia. EMS VS BP 136/74, HR 85, unable to get SPO2 due to amputee. Pt wears o2 at times at facility. Facility provided vague story. [place] was found at pt's bedside by EMS but pt was not wearing o2 at time of arrival. Rhonchi ausculated by EMS.

## 2015-06-11 NOTE — ED Notes (Signed)
Pt's CBG result was 139. Informed Danielle - RN.

## 2015-06-11 NOTE — Consult Note (Signed)
[facility] Renal Consultation Note    Indication for Consultation:  Management of ESRD/hemodialysis; anemia, hypertension/volume and secondary hyperparathyroidism  HPI: [name] is a 55 y.o. female with ESRD secondary to DM on HD almost five years who resides at [facility] due to her multiple medical issues. 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 She also has a hx of fungal sepsis 2012, severe leukocytoclastic vasculitis, PEA arrest//VDRF/septic shock with [facility] stay 2013. She missed dialysis yesterday due to low blood sugars.  Otherwise, her attendance is good and she runs her full treatments.  She does have intermittently high IDWG and chronic low blood pressures.  She has had her EDW lowered 2.5 kg over the past month which is unusual for her. She was sent to the ED this am when blood sugar failed to increase by measures at the [facility]. She had received her usual Lantus dose last evening (after being hypoglycemic during the day)  and found this am with foaming at the mouth with a BS of 21. BS in the ED was 70 after 1 amp of D50- she was placed on a D10 drip CXR showed excess volume, she is requiring 4 L O2.  Labs showed elevated WBC of 11.9 with 90% N platelets low at 112K K 4. At present she denies SOB, CP, N, V, D, dysuria or urgency or any sores/wounds. She has had cough. She has been trying to lose weight by eating less. She will be dialyzed today.  Past Medical History  Diagnosis Date  . Hypertension   . Anemia   . Diabetes mellitus   . Peripheral vascular disease (HCC)   . Hemodialysis patient (HCC) 03/15/11    "Tues; Thurs; Sat; [facility]"  . Blood transfusion   . Chronic back pain   . Rash 03/15/11    "admitted me to find out what its' from; UE, waist, thighs, groin"  . Renal failure     hd 4/12  . Stroke (HCC)   . PEA (Pulseless electrical  activity) (HCC)   . Dementia    Past Surgical History  Procedure Laterality Date  . Insertion of dialysis catheter  07/29/10    right subclavian  . Arteriovenous graft placement  10/01/10    left upper arm  . Below knee leg amputation  2004    left  . Amputation  03/04/2011    Procedure: AMPUTATION DIGIT;  Surgeon: [name], MD;  Location: [facility] OR;  Service: Orthopedics;  Laterality: Right;  RT GREAT TOE AMP  . Cataract extraction w/ intraocular lens implant  12/2009    right eye  . Carpal tunnel release  ~ 2009    left wrist  . Tee without cardioversion  03/18/2011    Procedure: TRANSESOPHAGEAL ECHOCARDIOGRAM (TEE);  Surgeon: [name], MD;  Location[facility];  Service: Cardiovascular;  Laterality: N/A;  . Hand amputation Left   . Amputation Right 03/13/2015    Procedure: RIGHT  ABOVE KNEE AMPUTATION;  Surgeon: Pryor Ochoa, MD;  Location: [facility] OR;  Service: Vascular;  Laterality: Right;   Family History  Problem Relation Age of Onset  . Diabetes Mellitus II Mother   . Stroke Mother    Social History:  reports that she has never smoked. She has never used smokeless tobacco. She reports that she does not drink alcohol or use illicit drugs. Allergies  Allergen  Reactions  . Codeine Hives  . Levofloxacin Other (See Comments)    unknown  . Penicillins Hives   Prior to Admission medications   Medication Sig Start Date End Date Taking? Authorizing Provider  glucagon (GLUCAGON EMERGENCY) 1 MG injection Inject 1 mg into the vein once as needed (type 2 diabetes).   Yes Historical Provider, MD  guaiFENesin (ROBITUSSIN) 100 MG/5ML SOLN Take 20 mLs by mouth every 8 (eight) hours.   Yes Historical Provider, MD  guaiFENesin-codeine (ROBITUSSIN AC) 100-10 MG/5ML syrup Take 30 mLs by mouth every 6 (six) hours as needed for cough.   Yes Historical Provider, MD  HYDROcodone-acetaminophen (NORCO/VICODIN) 5-325 MG tablet Take 1-2 tablets by mouth every 6 (six) hours as needed for moderate  pain. 03/16/15  Yes Hollice Espy, MD  insulin detemir (LEVEMIR) 100 UNIT/ML injection Inject 30 Units into the skin at bedtime.    Yes Historical Provider, MD  insulin lispro (HUMALOG KWIKPEN) 100 UNIT/ML KiwkPen Inject 5 Units into the skin 2 (two) times daily.   Yes Historical Provider, MD  lanthanum (FOSRENOL) 1000 MG chewable tablet Chew 2 tablets (2,000 mg total) by mouth 3 (three) times daily with meals. 03/16/15  Yes Hollice Espy, MD  loratadine (CLARITIN) 10 MG tablet Take 10 mg by mouth daily.   Yes Historical Provider, MD  meclizine (ANTIVERT) 25 MG tablet Take 25 mg by mouth 3 (three) times daily as needed for dizziness.   Yes Historical Provider, MD  Melatonin 5 MG TABS Take 5 mg by mouth at bedtime.   Yes Historical Provider, MD  metoCLOPramide (REGLAN) 5 MG tablet Take 5 mg by mouth 4 (four) times daily.   Yes Historical Provider, MD  metoprolol tartrate (LOPRESSOR) 25 MG tablet Take 12.5 mg by mouth See admin instructions. 12.5mg  twice daily on Sunday, Tuesday, Thursday, Saturday   Yes Historical Provider, MD  Multiple Vitamins-Iron (MULTIVITAMINS WITH IRON) TABS tablet Take 1 tablet by mouth daily.   Yes Historical Provider, MD  Nutritional Supplements (ENSURE CLEAR PO) Take 1 Can by mouth every evening.   Yes Historical Provider, MD  pantoprazole (PROTONIX) 40 MG tablet Take 40 mg by mouth every 12 (twelve) hours.    Yes Historical Provider, MD  traMADol (ULTRAM) 50 MG tablet Take 1 tablet (50 mg total) by mouth every 12 (twelve) hours as needed for moderate pain. 03/16/15  Yes Hollice Espy, MD  venlafaxine XR (EFFEXOR-XR) 75 MG 24 hr capsule Take 225 mg by mouth daily with breakfast.   Yes Historical Provider, MD   Current Facility-Administered Medications  Medication Dose Route Frequency Provider Last Rate Last Dose  . acetaminophen (TYLENOL) tablet 650 mg  650 mg Oral Q6H PRN Joseph Art, DO       Or  . acetaminophen (TYLENOL) suppository 650 mg  650 mg Rectal  Q6H PRN Joseph Art, DO      . dextrose 10 % infusion   Intravenous Continuous Samantha Tripp Dowless, PA-C 50 mL/hr at 06/11/15 0917    . dextrose 50 % solution 50 mL  1 ampule Intravenous Once [employer], DO   50 mL at 06/11/15 1215  . [START ON 06/12/2015] doxercalciferol (HECTOROL) injection 1 mcg  1 mcg Intravenous Q M,W,F-HD Weston Settle, PA-C      . doxercalciferol (HECTOROL) injection 1 mcg  1 mcg Intravenous Once in dialysis Weston Settle, PA-C      . ENSURE CLEAR LIQD   Oral QPM Joseph Art, DO      .  feeding supplement (NEPRO CARB STEADY) liquid 237 mL  237 mL Oral BID BM Weston Settle, PA-C      . guaiFENesin (ROBITUSSIN) 100 MG/5ML solution 400 mg  20 mL Oral 3 times per day Joseph Art, DO      . heparin injection 5,000 Units  5,000 Units Subcutaneous 3 times per day Joseph Art, DO      . HYDROcodone-acetaminophen (NORCO/VICODIN) 5-325 MG per tablet 1-2 tablet  1-2 tablet Oral Q6H PRN Joseph Art, DO      . lanthanum (FOSRENOL) chewable tablet 2,000 mg  2,000 mg Oral TID WC Joseph Art, DO      . loratadine (CLARITIN) tablet 10 mg  10 mg Oral Daily Joseph Art, DO   10 mg at 06/11/15 1159  . meclizine (ANTIVERT) tablet 25 mg  25 mg Oral TID PRN Joseph Art, DO      . metoCLOPramide (REGLAN) tablet 5 mg  5 mg Oral QID Jessica U Vann, DO      . multivitamins with iron tablet 1 tablet  1 tablet Oral Daily Joseph Art, DO      . ondansetron (ZOFRAN) tablet 4 mg  4 mg Oral Q6H PRN Joseph Art, DO       Or  . ondansetron (ZOFRAN) injection 4 mg  4 mg Intravenous Q6H PRN Joseph Art, DO      . pantoprazole (PROTONIX) EC tablet 40 mg  40 mg Oral Q12H Jessica U Vann, DO   40 mg at 06/11/15 1159  . [START ON 06/12/2015] venlafaxine XR (EFFEXOR-XR) 24 hr capsule 225 mg  225 mg Oral Q breakfast Joseph Art, DO       Current Outpatient Prescriptions  Medication Sig Dispense Refill  . glucagon (GLUCAGON EMERGENCY) 1 MG injection Inject 1 mg into the  vein once as needed (type 2 diabetes).    Marland Kitchen guaiFENesin (ROBITUSSIN) 100 MG/5ML SOLN Take 20 mLs by mouth every 8 (eight) hours.    Marland Kitchen guaiFENesin-codeine (ROBITUSSIN AC) 100-10 MG/5ML syrup Take 30 mLs by mouth every 6 (six) hours as needed for cough.    Marland Kitchen HYDROcodone-acetaminophen (NORCO/VICODIN) 5-325 MG tablet Take 1-2 tablets by mouth every 6 (six) hours as needed for moderate pain. 20 tablet 0  . insulin detemir (LEVEMIR) 100 UNIT/ML injection Inject 30 Units into the skin at bedtime.     . insulin lispro (HUMALOG KWIKPEN) 100 UNIT/ML KiwkPen Inject 5 Units into the skin 2 (two) times daily.    Marland Kitchen lanthanum (FOSRENOL) 1000 MG chewable tablet Chew 2 tablets (2,000 mg total) by mouth 3 (three) times daily with meals. 180 tablet 0  . loratadine (CLARITIN) 10 MG tablet Take 10 mg by mouth daily.    . meclizine (ANTIVERT) 25 MG tablet Take 25 mg by mouth 3 (three) times daily as needed for dizziness.    . Melatonin 5 MG TABS Take 5 mg by mouth at bedtime.    . metoCLOPramide (REGLAN) 5 MG tablet Take 5 mg by mouth 4 (four) times daily.    . metoprolol tartrate (LOPRESSOR) 25 MG tablet Take 12.5 mg by mouth See admin instructions. 12.5mg  twice daily on Sunday, Tuesday, Thursday, Saturday    . Multiple Vitamins-Iron (MULTIVITAMINS WITH IRON) TABS tablet Take 1 tablet by mouth daily.    . Nutritional Supplements (ENSURE CLEAR PO) Take 1 Can by mouth every evening.    . pantoprazole (PROTONIX) 40 MG tablet Take 40 mg by mouth every  12 (twelve) hours.     . traMADol (ULTRAM) 50 MG tablet Take 1 tablet (50 mg total) by mouth every 12 (twelve) hours as needed for moderate pain. 30 tablet 0  . venlafaxine XR (EFFEXOR-XR) 75 MG 24 hr capsule Take 225 mg by mouth daily with breakfast.     Labs: Basic Metabolic Panel:  Recent Labs Lab 06/11/15 0722  NA 138  K 4.0  CL 92*  CO2 27  GLUCOSE 159*  BUN 48*  CREATININE 6.86*  CALCIUM 9.1   Liver Function Tests:  Recent Labs Lab 06/11/15 0722  AST  30  ALT 18  ALKPHOS 96  BILITOT 0.9  PROT 7.5  ALBUMIN 3.3*  CBC:  Recent Labs Lab 06/11/15 0722  WBC 11.9*  NEUTROABS 10.7*  HGB 12.2  HCT 37.2  MCV 93.5  PLT 112*   CBG:  Recent Labs Lab 06/11/15 0814 06/11/15 0844 06/11/15 0948 06/11/15 1044 06/11/15 1202  GLUCAP 75 62* 107* 90 49*   Studies/Results: Dg Chest 2 View  06/11/2015  CLINICAL DATA:  Rhonchi and hypoxia EXAM: CHEST  2 VIEW COMPARISON:  03/19/2012 FINDINGS: Cardiac enlargement. Prominent interstitial lung markings may reflect mild fluid overload. No effusion. Mild atelectasis in the lung bases IMPRESSION: Cardiac enlargement with possible mild fluid overload and interstitial edema. Mild bibasilar atelectasis. Electronically Signed   By: Marlan Palau M.D.   On: 06/11/2015 08:13    ROS: As per HPI otherwise negative. ? Historian   Physical Exam: Filed Vitals:   06/11/15 0930 06/11/15 1039 06/11/15 1052 06/11/15 1100  BP: 175/46 111/65 111/65 131/61  Pulse: 78 84 81 80  Temp:      TempSrc:      Resp: 20 29 27 22   SpO2: 100% 100% 100% 100%     General:  WF NAD - appears older than current age on gurney in HD  Head: Normocephalic, atraumatic, sclera non-icteric, mucus membranes are moist; tongue red, several raw areas- almost looks like it had been bleeding - dried brown spattered liquid on face - denies vomiting - had been eating a cupcake Neck: Supple.  Lungs:  Coarse BS with rhonchi and wheezes Breathing is unlabored at rest on O2 Heart: RRR  Abdomen: obese Soft, non-tender, non-distended with normoactive bowel sounds. Extremities: left BKA right AKA, left hand amputation - well healed- several scabbed areas on left arm - no overt infections/s/p amp of right 3rd finger; no edema of extremities Neuro: Alert and oriented X 3. Moves all extremities spontaneously. Psych:  Responds to questions appropriately with usual affect at baseline Dialysis Access: AVF left upper + bruit and thrill - no evidence of  infection  Dialysis Orders: Center:  [facility] MWF -EDW 4 hr heparin 220 EDW 74.5 2 K 2.25 Ca profile 4 36 degrees AVF Hectorol 1 decreased 2/9 no Mircera or Fe - last Mircera was 50 q 2 weeks 2/1  Recent labs: hgb 13.7 19% sat ferritin 1524 iPTH 87 down from 202 Ca 9.9 P 5.3 alb 3.7 last WBC 8K plts 166 K  Hgb A1c 6.1 1/25, 8.5 10/26 and 7.7 7/27    Assessment/Plan: 1.  Refractory hypoglycemia/DM - ? Etiology - secondary infection; last hgb A1c 6.1 at outpt unit in January - losing wt - may need less lantus Most recent BS 49; on D10 at 50 /hr =600 cc per day 2.  ESRD -  MWF - missed HD Wed due to hypoglycemia - plan HD today; decide tomorrow whether to dialyze Friday to  get back on schedule or wait until Saturday and get back on schedule next week 3.  Hypertension/volume  - prone to high IDWG at times but other times volumes are good - volume up in part due to missed HD - able to get to edw recently; has been taking 12.5 MTP on nonHD days 4.  Anemia  - hgb 12.2 - no ESA or Fe 5.  Metabolic bone disease -  Ca 9.1 alb 3.3 - continue Hectorol 1/fosrenol 6.  Nutrition - continue renal carb mod diet/vit/added nepro- generally eats fairly well; prone to high IDWG 7. Thrombocytopenia - follow- normal at baseline  Sheffield Slider, PA-C [facility] Beeper [phone] 06/11/2015, 12:25 PM

## 2015-06-11 NOTE — ED Notes (Signed)
Pt offered crackers with peanut butter, and [place] sandwhich but pt refused. Pt offered sweet snacks, Pt given little debbie cake to eat. Able to feed self.

## 2015-06-11 NOTE — ED Notes (Signed)
CBG 70, PA notified and to order amp of d50.

## 2015-06-11 NOTE — Progress Notes (Signed)
Called ED for report.  Spoke with [employer].

## 2015-06-11 NOTE — Progress Notes (Signed)
Hypoglycemic Event  CBG: 46  Treatment: carb snack  Symptoms: none  Follow-up CBG: Time:18:46 CBG Result:69  Possible Reasons for Event: unknown  Comments/MD notified:yes    [name]

## 2015-06-11 NOTE — Progress Notes (Signed)
Hypoglycemic Event  CBG: 62  Treatment: dextrose  Symptoms: none  Follow-up CBG: Time: 19:42 CBG Result: 279  Possible Reasons for Event: unknown  Comments/MD notified: yes    Demetrio Lapping

## 2015-06-11 NOTE — Progress Notes (Signed)
Patient arrived on unit via stretcher from hemodialysis. 10% dextrose infusing in right lateral forearm at 50.  CBG 46. Pushed 15 mL of IV Dextrose.  IV infiltrated.  IV removed. Patient still complain of pain at IV insertion site.  IV team paged.

## 2015-06-11 NOTE — Progress Notes (Signed)
Hypoglycemic Event  CBG: 69  Treatment: carb snack  Symptoms: none  Follow-up CBG: Time 19:12 CBG Result:62  Possible Reasons for Event: unknown  Comments/MD notified:yes    Demetrio Lapping

## 2015-06-11 NOTE — ED Notes (Signed)
Pt alert and oriented, sent to dialysis with snacks, and report regarding CBG (q2h checks) given to dialysis RN.

## 2015-06-11 NOTE — H&P (Addendum)
[facility] History and Physical  [name] ZOX:[id number] DOB: October 21, 1960 DOA: 06/11/2015  Referring physician: er PCP: Irena Cords, MD   Chief Complaint: hypoglycemia  HPI: [name] is a 55 y.o. female  With PMHX of ESRD, DM- insulin dependant, anemia, dementia, HTN who resides at [facility].  She had a low blood sugar yesterday and was not sent to dialysis.  From reviewing record at [facility], she was given her usual lantus dosage last PM at 2100.  She was found this AM to be foaming at her mouth and not responsive.  Blood sugar was checked and was 21.  She got 3 glucogaon shots per ER record.  Patient and family do not report a huge change in appetite, patient says she does not miss meals- sometimes does not like what they serve but eats a sandwich.  Patient get lantus as well as meal coverage at the SNF.   In the ER, patient blood sugars dropped to 70 after 1 amp d50.  She was then placed on D10 infusion.  X ray shows volume overload and patient is requiring 4L O2 in ER (does not wear on a regular basis). Nephrology was called for dialysis.     Review of Systems:  Unable to do ROS as patient confused-- only c/o tingling in stump    Past Medical History  Diagnosis Date  . Hypertension   . Anemia   . Diabetes mellitus   . Peripheral vascular disease (HCC)   . Hemodialysis patient (HCC) 03/15/11    "Tues; Thurs; Sat; [facility]"  . Blood transfusion   . Chronic back pain   . Rash 03/15/11    "admitted me to find out what its' from; UE, waist, thighs, groin"  . Renal failure     hd 4/12  . Stroke (HCC)   . PEA (Pulseless electrical activity) [facility])    Past Surgical History  Procedure Laterality Date  . Insertion of dialysis catheter  07/29/10    right subclavian  . Arteriovenous graft placement  10/01/10    left upper arm  . Below knee leg amputation  2004    left  . Amputation  03/04/2011    Procedure: AMPUTATION  DIGIT;  Surgeon: [name], MD;  Location: [facility] OR;  Service: Orthopedics;  Laterality: Right;  RT GREAT TOE AMP  . Cataract extraction w/ intraocular lens implant  12/2009    right eye  . Carpal tunnel release  ~ 2009    left wrist  . Tee without cardioversion  03/18/2011    Procedure: TRANSESOPHAGEAL ECHOCARDIOGRAM (TEE);  Surgeon: [name], MD;  Location: [facility] ENDOSCOPY;  Service: Cardiovascular;  Laterality: N/A;  . Hand amputation Left   . Amputation Right 03/13/2015    Procedure: RIGHT  ABOVE KNEE AMPUTATION;  Surgeon: Pryor Ochoa, MD;  Location: [facility] OR;  Service: Vascular;  Laterality: Right;   Social History:  reports that she has never smoked. She has never used smokeless tobacco. She reports that she does not drink alcohol or use illicit drugs.  Allergies  Allergen Reactions  . Codeine Hives  . Levofloxacin Other (See Comments)    unknown  . Penicillins Hives    Family History  Problem Relation Age of Onset  . Diabetes Mellitus II Mother   . Stroke Mother     Prior to Admission medications   Medication Sig Start Date End Date Taking? Authorizing Provider  glucagon (GLUCAGON EMERGENCY) 1 MG  injection Inject 1 mg into the vein once as needed (type 2 diabetes).   Yes Historical Provider, MD  guaiFENesin (ROBITUSSIN) 100 MG/5ML SOLN Take 20 mLs by mouth every 8 (eight) hours.   Yes Historical Provider, MD  guaiFENesin-codeine (ROBITUSSIN AC) 100-10 MG/5ML syrup Take 30 mLs by mouth every 6 (six) hours as needed for cough.   Yes Historical Provider, MD  HYDROcodone-acetaminophen (NORCO/VICODIN) 5-325 MG tablet Take 1-2 tablets by mouth every 6 (six) hours as needed for moderate pain. 03/16/15  Yes Hollice Espy, MD  insulin detemir (LEVEMIR) 100 UNIT/ML injection Inject 30 Units into the skin at bedtime.    Yes Historical Provider, MD  insulin lispro (HUMALOG KWIKPEN) 100 UNIT/ML KiwkPen Inject 5 Units into the skin 2 (two) times daily.   Yes Historical Provider, MD    lanthanum (FOSRENOL) 1000 MG chewable tablet Chew 2 tablets (2,000 mg total) by mouth 3 (three) times daily with meals. 03/16/15  Yes Hollice Espy, MD  loratadine (CLARITIN) 10 MG tablet Take 10 mg by mouth daily.   Yes Historical Provider, MD  meclizine (ANTIVERT) 25 MG tablet Take 25 mg by mouth 3 (three) times daily as needed for dizziness.   Yes Historical Provider, MD  Melatonin 5 MG TABS Take 5 mg by mouth at bedtime.   Yes Historical Provider, MD  metoCLOPramide (REGLAN) 5 MG tablet Take 5 mg by mouth 4 (four) times daily.   Yes Historical Provider, MD  metoprolol tartrate (LOPRESSOR) 25 MG tablet Take 12.5 mg by mouth See admin instructions. 12.5mg  twice daily on Sunday, Tuesday, Thursday, Saturday   Yes Historical Provider, MD  Multiple Vitamins-Iron (MULTIVITAMINS WITH IRON) TABS tablet Take 1 tablet by mouth daily.   Yes Historical Provider, MD  Nutritional Supplements (ENSURE CLEAR PO) Take 1 Can by mouth every evening.   Yes Historical Provider, MD  pantoprazole (PROTONIX) 40 MG tablet Take 40 mg by mouth every 12 (twelve) hours.    Yes Historical Provider, MD  traMADol (ULTRAM) 50 MG tablet Take 1 tablet (50 mg total) by mouth every 12 (twelve) hours as needed for moderate pain. 03/16/15  Yes Hollice Espy, MD  venlafaxine XR (EFFEXOR-XR) 75 MG 24 hr capsule Take 225 mg by mouth daily with breakfast.   Yes Historical Provider, MD   Physical Exam: Filed Vitals:   06/11/15 0915 06/11/15 0930 06/11/15 1039 06/11/15 1052  BP: 177/55 175/46 111/65 111/65  Pulse: 76 78 84 81  Temp:      TempSrc:      Resp: 20 20 29 27   SpO2: 100% 100% 100% 100%    Wt Readings from Last 3 Encounters:  03/16/15 75.8 kg (167 lb 1.7 oz)  03/10/15 76.658 kg (169 lb)  12/12/14 77 kg (169 lb 12.1 oz)    General:  Wearing 4L O2 Eyes: PERRL, normal lids, irises & conjunctiva ENT: grossly normal hearing, lips & tongue Neck: no LAD, masses or thyromegaly Cardiovascular: RRR, no m/r/g. No LE  edema. Telemetry: SR, no arrhythmias  Respiratory: coarse breath sounds Abdomen: soft, ntnd Skin: no rash or induration seen on limited exam Musculoskeletal: amputee Psychiatric: grossly normal mood and affect, speech fluent and appropriate Neurologic: grossly non-focal.          Labs on Admission:  Basic Metabolic Panel:  Recent Labs Lab 06/11/15 0722  NA 138  K 4.0  CL 92*  CO2 27  GLUCOSE 159*  BUN 48*  CREATININE 6.86*  CALCIUM 9.1   Liver Function Tests:  Recent Labs Lab 06/11/15 0722  AST 30  ALT 18  ALKPHOS 96  BILITOT 0.9  PROT 7.5  ALBUMIN 3.3*   No results for input(s): LIPASE, AMYLASE in the last 168 hours. No results for input(s): AMMONIA in the last 168 hours. CBC:  Recent Labs Lab 06/11/15 0722  WBC 11.9*  NEUTROABS 10.7*  HGB 12.2  HCT 37.2  MCV 93.5  PLT 112*   Cardiac Enzymes: No results for input(s): CKTOTAL, CKMB, CKMBINDEX, TROPONINI in the last 168 hours.  BNP (last 3 results) No results for input(s): BNP in the last 8760 hours.  ProBNP (last 3 results) No results for input(s): PROBNP in the last 8760 hours.  CBG:  Recent Labs Lab 06/11/15 0724 06/11/15 0814 06/11/15 0844 06/11/15 0948 06/11/15 1044  GLUCAP 147* 75 62* 107* 90    Radiological Exams on Admission: Dg Chest 2 View  06/11/2015  CLINICAL DATA:  Rhonchi and hypoxia EXAM: CHEST  2 VIEW COMPARISON:  03/19/2012 FINDINGS: Cardiac enlargement. Prominent interstitial lung markings may reflect mild fluid overload. No effusion. Mild atelectasis in the lung bases IMPRESSION: Cardiac enlargement with possible mild fluid overload and interstitial edema. Mild bibasilar atelectasis. Electronically Signed   By: Marlan Palau M.D.   On: 06/11/2015 08:13    EKG: Independently reviewed. Sinus with prolonged Qtc  Assessment/Plan Principal Problem:   Hypoglycemia due to type 2 diabetes mellitus (HCC) Active Problems:   HTN (hypertension)   ESRD (end stage renal  disease) on dialysis (HCC)   Obesities, morbid (HCC)   Acute respiratory failure with hypoxia (HCC)   Thrombocytopenia (HCC)   Chronic anemia   Depression   Prolonged Q-T interval on ECG   Pulmonary edema  Hypoglycemia in type 2 DM -acute -hold lantus and novolog -D10-- check blood sugars q 2 hours, will try to wean off d10 once eating -will need new diabetic med regimen at d/c- may allow higher goals  Chronic resp failure -on 4L- occasionally wears at SNF -wean off after dialysis  HTN -continue home meds  Pulm edema- missed dialysis yesterday-- on 4L O2  ESRD-needs HD for volume overload -missed Wednesday  Thrombocytopenia Heparin for DVT prophylaxis D/c if plt drop  Leukocytosis -trend -?reactive  Dementia -has legal guardian -appears to be at baseline  Nephrology-- asked ER To call for diaysis today as family states patient MISSED dialysis yesterday  Code Status:full DVT Prophylaxis: heparin Family Communication: husband at bedside Disposition Plan: admit for dialysis and D10 infusion for blood sugar management  Time spent: 75 min  JESSICA U [facility] Pager [phone]

## 2015-06-11 NOTE — Progress Notes (Signed)
06/11/2015 7:33 PM  CBG remains low despite interventions per protocol.  D10 infusion also stopped at this time due to loss of IV access.  Sugar currently running anywhere from 46-69, with most recent being 62.  Patient is completely asymptomatic.  [facility] on[facility] paged and made aware of situation.  Orders received to administer IM glucagon AND an amp of D50 and restart D10 infusion as soon as possible.  We are to recheck CBG in 15 minutes.  Per oncall provider, RN is to call provider back in 30-45 minutes if sugar does not increase to normal limits.  Medications administered by night shift per provider order and was reconnected to D10 infusion once access was re-established.  Theadora Rama

## 2015-06-11 NOTE — ED Notes (Signed)
Xray notified that pt is ready. Pt given orange juice.

## 2015-06-11 NOTE — ED Notes (Signed)
Gave pt orange juice, per Danielle - RN.

## 2015-06-11 NOTE — ED Provider Notes (Signed)
CSN: [id number]     Arrival date & time 06/11/15  0603 History   First MD Initiated Contact with Patient 06/11/15 [phone]     Chief Complaint  Patient presents with  . Hypoglycemia     (Consider location/radiation/quality/duration/timing/severity/associated sxs/prior Treatment) HPI   Adelynne Joerger is a 55 y.o F with a pmhx of uncontrolled DM s/p bilateral LE amputations, on dialysis MWF, HTN, dementia who presents to the ED today sent from [facility] for hypoglycemia. Level V caveat, dementia. Per nursing facility pt was checked on around 3 AM and was found to be foaming at the mouth and not responding to verbal stimuli. Her blood sugar was found to be 21. They gave her a total of 3 glucagon shots with not much resolution in the blood sugar level. Nursing facility also states that patient had another episode of hypoglycemia on Tuesday night which resolved after glucagon. Nursing staff says that patient has been eating less over the last 2 days however, is still receiving the same amount of Levemir and additional diabetic medications. Patient is now alert and oriented in the ED. CBG on arrival is 90. Per EMS nasal cannula was found to patient's bedside but she was not wearing it. Patient states she occasionally wears O2 at facility but not all the time. No history of COPD or CHF. Patient on 4 L O2 in ED.  Past Medical History  Diagnosis Date  . Hypertension   . Anemia   . Diabetes mellitus   . Peripheral vascular disease (HCC)   . Hemodialysis patient (HCC) 03/15/11    "Tues; Thurs; Sat; [facility]"  . Blood transfusion   . Chronic back pain   . Rash 03/15/11    "admitted me to find out what its' from; UE, waist, thighs, groin"  . Renal failure     hd 4/12  . Stroke (HCC)   . PEA (Pulseless electrical activity) [facility])    Past Surgical History  Procedure Laterality Date  . Insertion of dialysis catheter  07/29/10    right subclavian  . Arteriovenous graft  placement  10/01/10    left upper arm  . Below knee leg amputation  2004    left  . Amputation  03/04/2011    Procedure: AMPUTATION DIGIT;  Surgeon: [name], MD;  Location: [facility] OR;  Service: Orthopedics;  Laterality: Right;  RT GREAT TOE AMP  . Cataract extraction w/ intraocular lens implant  12/2009    right eye  . Carpal tunnel release  ~ 2009    left wrist  . Tee without cardioversion  03/18/2011    Procedure: TRANSESOPHAGEAL ECHOCARDIOGRAM (TEE);  Surgeon: [name], MD;  Location: [facility] ENDOSCOPY;  Service: Cardiovascular;  Laterality: N/A;  . Hand amputation Left   . Amputation Right 03/13/2015    Procedure: RIGHT  ABOVE KNEE AMPUTATION;  Surgeon: Pryor Ochoa, MD;  Location: [facility] OR;  Service: Vascular;  Laterality: Right;   Family History  Problem Relation Age of Onset  . Diabetes Mellitus II Mother   . Stroke Mother    Social History  Substance Use Topics  . Smoking status: Never Smoker   . Smokeless tobacco: Never Used  . Alcohol Use: No   OB History    No data available     Review of Systems  All other systems reviewed and are negative.     Allergies  Codeine; Levofloxacin; and Penicillins  Home Medications   Prior to Admission medications  Medication Sig Start Date End Date Taking? Authorizing Provider  guaiFENesin-codeine (ROBITUSSIN AC) 100-10 MG/5ML syrup Take 30 mLs by mouth every 6 (six) hours as needed for cough.    Historical Provider, MD  HYDROcodone-acetaminophen (NORCO/VICODIN) 5-325 MG tablet Take 1-2 tablets by mouth every 6 (six) hours as needed for moderate pain. 03/16/15   Hollice Espy, MD  insulin aspart (NOVOLOG) 100 UNIT/ML injection Inject 0-10 Units into the skin 3 (three) times a week. Mondays, Wednesdays and Fridays    Historical Provider, MD  insulin detemir (LEVEMIR) 100 UNIT/ML injection Inject 18 Units into the skin at bedtime.     Historical Provider, MD  lanthanum (FOSRENOL) 1000 MG chewable tablet Chew 2 tablets (2,000  mg total) by mouth 3 (three) times daily with meals. 03/16/15   Hollice Espy, MD  loratadine (CLARITIN) 10 MG tablet Take 10 mg by mouth daily.    Historical Provider, MD  meclizine (ANTIVERT) 25 MG tablet Take 25 mg by mouth 3 (three) times daily as needed for dizziness.    Historical Provider, MD  Melatonin 5 MG TABS Take 5 mg by mouth at bedtime.    Historical Provider, MD  metoCLOPramide (REGLAN) 5 MG tablet Take 5 mg by mouth 4 (four) times daily.    Historical Provider, MD  metoprolol tartrate (LOPRESSOR) 25 MG tablet Take 12.5 mg by mouth See admin instructions. 12.5mg  twice daily on Sunday, Tuesday, Thursday, Saturday    Historical Provider, MD  Multiple Vitamins-Iron (MULTIVITAMINS WITH IRON) TABS tablet Take 1 tablet by mouth daily.    Historical Provider, MD  Nutritional Supplements (ENSURE CLEAR PO) Take 1 Can by mouth every evening.    Historical Provider, MD  pantoprazole (PROTONIX) 40 MG tablet Take 40 mg by mouth every 12 (twelve) hours.     Historical Provider, MD  traMADol (ULTRAM) 50 MG tablet Take 1 tablet (50 mg total) by mouth every 12 (twelve) hours as needed for moderate pain. 03/16/15   Hollice Espy, MD  venlafaxine XR (EFFEXOR-XR) 75 MG 24 hr capsule Take 225 mg by mouth daily with breakfast.    Historical Provider, MD   BP 133/75 mmHg  Pulse 81  Temp(Src) 97.6 F (36.4 C) (Oral)  Resp 19  SpO2 100% Physical Exam  Constitutional: No distress.  HENT:  Head: Normocephalic and atraumatic.  Mouth/Throat: No oropharyngeal exudate.  Eyes: Conjunctivae and EOM are normal. Pupils are equal, round, and reactive to light. Right eye exhibits no discharge. Left eye exhibits no discharge. No scleral icterus.  Neck: Normal range of motion. Neck supple. No JVD present.  Cardiovascular: Normal rate, regular rhythm, normal heart sounds and intact distal pulses.  Exam reveals no gallop and no friction rub.   No murmur heard. Pulmonary/Chest: Effort normal. No stridor. No  respiratory distress. She has wheezes. She has rales. She exhibits no tenderness.  Rhonchi present throughout.  Abdominal: Soft. Bowel sounds are normal. She exhibits no distension. There is no tenderness. There is no rebound and no guarding.  Musculoskeletal: Normal range of motion. She exhibits no edema.  Left BKA, Right AKA, Left hand amputation.  Lymphadenopathy:    She has no cervical adenopathy.  Neurological: She is alert. No cranial nerve deficit.  Pt is alert, limited ability to answer questions. Disoriented. Unable to recall events. Hx dementia.  Skin: Skin is warm and dry. No rash noted. She is not diaphoretic. No erythema. No pallor.  Psychiatric: She has a normal mood and affect. Her behavior is normal.  Nursing note and vitals reviewed.   ED Course  Procedures (including critical care time) Labs Review Labs Reviewed  COMPREHENSIVE METABOLIC PANEL - Abnormal; Notable for the following:    Chloride 92 (*)    Glucose, Bld 159 (*)    BUN 48 (*)    Creatinine, Ser 6.86 (*)    Albumin 3.3 (*)    GFR calc non Af Amer 6 (*)    GFR calc Af Amer 7 (*)    Anion gap 19 (*)    All other components within normal limits  CBC WITH DIFFERENTIAL/PLATELET - Abnormal; Notable for the following:    WBC 11.9 (*)    RDW 16.3 (*)    Platelets 112 (*)    Neutro Abs 10.7 (*)    All other components within normal limits  URINALYSIS, ROUTINE W REFLEX MICROSCOPIC (NOT AT [facility]) - Abnormal; Notable for the following:    APPearance CLOUDY (*)    Glucose, UA 100 (*)    Hgb urine dipstick LARGE (*)    Protein, ur >300 (*)    Leukocytes, UA SMALL (*)    All other components within normal limits  URINE MICROSCOPIC-ADD ON - Abnormal; Notable for the following:    Squamous Epithelial / LPF 0-5 (*)    Bacteria, UA FEW (*)    All other components within normal limits  CBG MONITORING, ED - Abnormal; Notable for the following:    Glucose-Capillary 147 (*)    All other components within normal  limits  CBG MONITORING, ED - Abnormal; Notable for the following:    Glucose-Capillary 62 (*)    All other components within normal limits  CBG MONITORING, ED  CBG MONITORING, ED  CBG MONITORING, ED  I-STAT CG4 LACTIC ACID, ED    Imaging Review Dg Chest 2 View  06/11/2015  CLINICAL DATA:  Rhonchi and hypoxia EXAM: CHEST  2 VIEW COMPARISON:  03/19/2012 FINDINGS: Cardiac enlargement. Prominent interstitial lung markings may reflect mild fluid overload. No effusion. Mild atelectasis in the lung bases IMPRESSION: Cardiac enlargement with possible mild fluid overload and interstitial edema. Mild bibasilar atelectasis. Electronically Signed   By: Marlan Palau M.D.   On: 06/11/2015 08:13   I have personally reviewed and evaluated these images and lab results as part of my medical decision-making.   EKG Interpretation   Date/Time:  Thursday June 11 2015 08:55:48 EST Ventricular Rate:  78 PR Interval:  173 QRS Duration: 114 QT Interval:  469 QTC Calculation: 534 R Axis:   120 Text Interpretation:  Sinus rhythm Borderline intraventricular conduction  delay Low voltage, precordial leads Borderline repolarization abnormality  Prolonged QT interval Confirmed by GOLDSTON  MD, SCOTT ([id number]) on 06/11/2015  9:48:37 AM      MDM   Final diagnoses:  Hypoglycemia  Hypervolemia, unspecified hypervolemia type    55 y.o F with significant pmhx including uncontrolled DM, on dialysis MWF, multiple limb amputations, dementia who presents to the ED today sent by her nursing home for hypoglycemia. Patient was found at nursing home unresponsive to verbal stimuli and foaming at the mouth. Blood sugar was 21 and would not elevate despite several glucagon shots. On presentation to ED patient is alert, however disoriented and with limited ability to answer questions. Patient does not recall events. Patient does not have any active complaints at this time. CBG was 90. Rhonchi auscultated on lung exam.  Patient on 4 L O2 in ED. We'll obtain lab work and chest x-ray. Patient given DuoNeb.  Repeat CBG dropped to 70 while in route to chest x-ray. Patient given 1 amp D50 and encouraged to eat food.  CBG then dropped again to 60. Patient placed on D10 drip. Repeat lung exam unimproved. Patient denies shortness of breath or difficulty breathing. Chest x-ray reveals possible mild fluid overload and interstitial edema with mild bibasilar atelectasis. No consolidation concerning for pneumonia. Mild leukocytosis present at 11.9. Metabolic panel abnormal. creatinine elevated at 6.86. Patient is scheduled to get her dialysis tomorrow.  Given recurrent hypoglycemia despite administration of dextrose and food consumption, recommend admission. Patient may require dialysis treatment today for fluid overload. We will consult hospitalist.  Spoke with hospitalist who will admit pt to their service. Recommend nephrology consult as pt will require dialysis today. Concern that giving D10, although necessary will worsen fluid overload.   Spoke with nephrology who will consult pt in ED. Given that D10 is administered slowly at 57ml/hr and that is has less impact on intravascular space, additional fluid overload from D10 drip is less of a concern. Nephrology will still consult pt for dialysis. Pt is currently hemodynamically stable.  Patient was discussed with and seen by Dr. Criss Alvine who agrees with the treatment plan.       Lester Kinsman [place], PA-C 06/11/15 1106  Pricilla Loveless, MD 06/12/15 [phone]

## 2015-06-12 DIAGNOSIS — F329 Major depressive disorder, single episode, unspecified: Secondary | ICD-10-CM

## 2015-06-12 DIAGNOSIS — I4581 Long QT syndrome: Secondary | ICD-10-CM

## 2015-06-12 DIAGNOSIS — D649 Anemia, unspecified: Secondary | ICD-10-CM

## 2015-06-12 DIAGNOSIS — D696 Thrombocytopenia, unspecified: Secondary | ICD-10-CM

## 2015-06-12 LAB — BASIC METABOLIC PANEL
ANION GAP: 13 (ref 5–15)
BUN: 20 mg/dL (ref 6–20)
CO2: 27 mmol/L (ref 22–32)
Calcium: 8.5 mg/dL — ABNORMAL LOW (ref 8.9–10.3)
Chloride: 93 mmol/L — ABNORMAL LOW (ref 101–111)
Creatinine, Ser: 3.86 mg/dL — ABNORMAL HIGH (ref 0.44–1.00)
GFR, EST AFRICAN AMERICAN: 14 mL/min — AB (ref >60)
GFR, EST NON AFRICAN AMERICAN: 12 mL/min — AB (ref >60)
GLUCOSE: 66 mg/dL (ref 65–99)
POTASSIUM: 4.2 mmol/L (ref 3.5–5.1)
Sodium: 133 mmol/L — ABNORMAL LOW (ref 135–145)

## 2015-06-12 LAB — HEMOGLOBIN A1C
Hgb A1c MFr Bld: 6 % — ABNORMAL HIGH (ref 4.8–5.6)
Mean Plasma Glucose: 126 mg/dL

## 2015-06-12 LAB — GLUCOSE, CAPILLARY
GLUCOSE-CAPILLARY: 126 mg/dL — AB (ref 65–99)
GLUCOSE-CAPILLARY: 78 mg/dL (ref 65–99)
GLUCOSE-CAPILLARY: 98 mg/dL (ref 65–99)
GLUCOSE-CAPILLARY: 119 mg/dL — AB (ref 65–99)
GLUCOSE-CAPILLARY: 110 mg/dL — AB (ref 65–99)
Glucose-Capillary: 101 mg/dL — ABNORMAL HIGH (ref 65–99)
Glucose-Capillary: 104 mg/dL — ABNORMAL HIGH (ref 65–99)
Glucose-Capillary: 196 mg/dL — ABNORMAL HIGH (ref 65–99)
Glucose-Capillary: 87 mg/dL (ref 65–99)
Glucose-Capillary: 94 mg/dL (ref 65–99)

## 2015-06-12 LAB — CBC
HEMATOCRIT: 38.3 % (ref 36.0–46.0)
HEMOGLOBIN: 12.5 g/dL (ref 12.0–15.0)
MCH: 30.2 pg (ref 26.0–34.0)
MCHC: 32.6 g/dL (ref 30.0–36.0)
MCV: 92.5 fL (ref 78.0–100.0)
Platelets: 116 10*3/uL — ABNORMAL LOW (ref 150–400)
RBC: 4.14 MIL/uL (ref 3.87–5.11)
RDW: 16.2 % — ABNORMAL HIGH (ref 11.5–15.5)
WBC: 5.9 10*3/uL (ref 4.0–10.5)

## 2015-06-12 LAB — URINE CULTURE: Culture: NO GROWTH

## 2015-06-12 MED ORDER — PNEUMOCOCCAL VAC POLYVALENT 25 MCG/0.5ML IJ INJ
0.5000 mL | INJECTION | INTRAMUSCULAR | Status: AC
  Administered 2015-06-13: 0.5 mL via INTRAMUSCULAR
  Filled 2015-06-12: qty 0.5

## 2015-06-12 NOTE — Clinical Social Work Note (Signed)
CSW received consult: patient from [facility].  Patient is known to CSW from previous admissions.  Patient is alert though oriented to self at this time.  Patient has spouse who is main caregiver: Electronics engineer.  CSW left message for spouse to complete assessment for return to SNF at dc.  CSW awaiting a return call.  Vickii Penna, LCSW [phone]  [id number]; 2S 15-16 and Hospital Psych[facility] Service Line Licensed Clinical Social Worker

## 2015-06-12 NOTE — Progress Notes (Signed)
[facility] PROGRESS NOTE  JAWANA REAGOR ZOX:[id number] DOB: 23-Jul-1960 DOA: 06/11/2015 PCP: Irena Cords, MD  Assessment/Plan:  Hypoglycemia/DM2 - presented from NH after being found on the floor foaming at the mouth with blood glucose of 21. She was given 1 amp of D50 in the ED which raised it to 70. Then started on D10 drip. Blood glucose this morning with readings between 78-119, discontinue D10 drip and continue to monitor blood glucose. Pt eating well with no complaints.  ESRD - dialysis scheduled on MWF, missed Wed due to hypoglycemia, pt dialyzed yesterday with 2-3L removed, plan to dialyze again today and start back on regular schedule next week.   Pulmonary edema - CXR showed fluid overload, was requiring 4L O2, improved after dialysis and currently off of oxygen with no complaints other than cough, wheezes/rhonchi present on exam, dialyze again on Saturday and back to her regular schedule.   Code Status: Full code Family Communication: None Disposition Plan: Admit   Consultants:  Nephrology  Procedures:  None  Antibiotics:  None  HPI/Subjective: 55 year old female with ESRD secondary to DM, multiple extremity amputations, on dialysis, was sent to the ED yesterday morning from [facility] when staff were unable to raise her blood sugars. She had received her usual Lantus dose that evening (after being hypoglycemic during the day) and was found this morning foaming at the mouth with a BS of 21. BS in the ED was 70 after 1 amp of D50, she was placed on a D10 drip.CXR showed volume overload, was requiring 4L O2. Labs showed elevated WBC of 11.9 with 90% N platelets low at 112K K 4. At present she denies any complaints other than a cough that started a few days ago. Currently denies any CP or SOB, off of oxygen. Her BS this morning have been between 78-119, D10 drip discontinued, pt eating normally. Plan for dialysis today and continuous BS  monitoring  Objective: Filed Vitals:   06/12/15 0357 06/12/15 0833  BP: 100/45 145/48  Pulse: 91 93  Temp: 99 F (37.2 C) 98.4 F (36.9 C)  Resp: 22 22    Intake/Output Summary (Last 24 hours) at 06/12/15 1136 Last data filed at 06/12/15 0600  Gross per 24 hour  Intake    550 ml  Output   2436 ml  Net  -1886 ml   Filed Weights   06/11/15 1709 06/11/15 1825 06/11/15 2110  Weight: 73.3 kg (161 lb 9.6 oz) 73.256 kg (161 lb 8 oz) 73.483 kg (162 lb)    Exam:   General:  Alert and oriented, resting comfortably  Cardiovascular: RRR  Respiratory: Coarse breath sounds, wheezing/rhonchi, in no acute respiratory distress  Abdomen: Soft, non-tender  Musculoskeletal: amputee: left BKA, right AKA, left hand amputation, right 3rd digit amputation  Data Reviewed: Basic Metabolic Panel:  Recent Labs Lab 06/11/15 0722 06/12/15 0701  NA 138 133*  K 4.0 4.2  CL 92* 93*  CO2 27 27  GLUCOSE 159* 66  BUN 48* 20  CREATININE 6.86* 3.86*  CALCIUM 9.1 8.5*   Liver Function Tests:  Recent Labs Lab 06/11/15 0722  AST 30  ALT 18  ALKPHOS 96  BILITOT 0.9  PROT 7.5  ALBUMIN 3.3*   No results for input(s): LIPASE, AMYLASE in the last 168 hours. No results for input(s): AMMONIA in the last 168 hours. CBC:  Recent Labs Lab 06/11/15 0722 06/12/15 0701  WBC 11.9* 5.9  NEUTROABS 10.7*  --   HGB 12.2 12.5  HCT 37.2 38.3  MCV 93.5 92.5  PLT 112* 116*   Cardiac Enzymes: No results for input(s): CKTOTAL, CKMB, CKMBINDEX, TROPONINI in the last 168 hours. BNP (last 3 results) No results for input(s): BNP in the last 8760 hours.  ProBNP (last 3 results) No results for input(s): PROBNP in the last 8760 hours.  CBG:  Recent Labs Lab 06/12/15 0357 06/12/15 0614 06/12/15 0758 06/12/15 1046 06/12/15 1123  GLUCAP 126* 78 98 104* 119*    No results found for this or any previous visit (from the past 240 hour(s)).   Studies: Dg Chest 2 View  06/11/2015  CLINICAL  DATA:  Rhonchi and hypoxia EXAM: CHEST  2 VIEW COMPARISON:  03/19/2012 FINDINGS: Cardiac enlargement. Prominent interstitial lung markings may reflect mild fluid overload. No effusion. Mild atelectasis in the lung bases IMPRESSION: Cardiac enlargement with possible mild fluid overload and interstitial edema. Mild bibasilar atelectasis. Electronically Signed   By: Marlan Palau M.D.   On: 06/11/2015 08:13    Scheduled Meds: . dextrose  1 ampule Intravenous Once  . doxercalciferol  1 mcg Intravenous Q M,W,F-HD  . doxercalciferol  1 mcg Intravenous Once in dialysis  . feeding supplement (ENSURE ENLIVE)   Oral QPM  . feeding supplement (NEPRO CARB STEADY)  237 mL Oral BID BM  . guaiFENesin  20 mL Oral 3 times per day  . heparin  5,000 Units Subcutaneous 3 times per day  . lanthanum  2,000 mg Oral TID WC  . loratadine  10 mg Oral Daily  . metoCLOPramide  5 mg Oral QID  . multivitamins with iron  1 tablet Oral Daily  . pantoprazole  40 mg Oral Q12H  . [START ON 06/13/2015] pneumococcal 23 valent vaccine  0.5 mL Intramuscular Tomorrow-1000  . venlafaxine XR  225 mg Oral Q breakfast   Continuous Infusions:   Principal Problem:   Hypoglycemia due to type 2 diabetes mellitus (HCC) Active Problems:   HTN (hypertension)   ESRD (end stage renal disease) on dialysis (HCC)   Obesities, morbid (HCC)   Acute respiratory failure with hypoxia (HCC)   Thrombocytopenia (HCC)   Chronic anemia   Depression   Prolonged Q-T interval on ECG   Pulmonary edema     Maylene Roes PA-S wrote parts of this note [facility] Pager [phone]. If 7PM-7AM, please contact night-coverage at [email or web address], password [facility] 06/12/2015, 11:36 AM  LOS: 1 day     Clint Lipps Pager: [phone] 06/12/2015, 2:03 PM

## 2015-06-12 NOTE — Progress Notes (Signed)
Utilization review completed. Jamie Hafford, RN, BSN. 

## 2015-06-12 NOTE — Progress Notes (Signed)
[facility] Rounding Note Subjective:  Says she feels much better  Still hears herself wheezing a bit Had HD yesterday  Net UF 2.436 liters  Objective Vital signs in last 24 hours: Filed Vitals:   06/11/15 1825 06/11/15 2110 06/12/15 0357 06/12/15 0833  BP: 119/83 119/47 100/45 145/48  Pulse: 87 88 91 93  Temp: 97.8 F (36.6 C) 98.7 F (37.1 C) 99 F (37.2 C) 98.4 F (36.9 C)  TempSrc: Oral   Oral  Resp: Weight: 73.256 kg (161 lb 8 oz) 73.483 kg (162 lb)    SpO2: 100% 97% 97% 98%   Weight change:   Intake/Output Summary (Last 24 hours) at 06/12/15 1302 Last data filed at 06/12/15 0945  Gross per 24 hour  Intake    790 ml  Output   2436 ml  Net  -1646 ml   Physical Exam:  Blood pressure 145/48, pulse 93, temperature 98.4 F (36.9 C), temperature source Oral, resp. rate 22, weight 73.483 kg (162 lb), SpO2 98 %. General: WF NAD - appears older than current age  Head: Normocephalic, atraumatic,  Lungs: Coarse BS w mild exp wheeze Heart: RRR  Abdomen: obese Soft, non-tender, non-distended with normoactive bowel sounds. Extremities: left BKA right AKA, left hand amputation - well healed- several scabbed areas on left arm - no overt infections/s/p amp of right 3rd finger; no edema of extremities Neuro: Alert and oriented X 3. Moves all extremities spontaneously. Psych: Responds to questions appropriately with usual affect at baseline Dialysis Access: AVF left upper + bruit and thrill   Labs: Basic Metabolic Panel:  Recent Labs Lab 06/11/15 0722 06/12/15 0701  NA 138 133*  K 4.0 4.2  CL 92* 93*  CO2 27 27  GLUCOSE 159* 66  BUN 48* 20  CREATININE 6.86* 3.86*  CALCIUM 9.1 8.5*   Liver Function Tests:  Recent Labs Lab 06/11/15 0722  AST 30  ALT 18  ALKPHOS 96  BILITOT 0.9  PROT 7.5  ALBUMIN 3.3*     Recent Labs Lab 06/11/15 0722 06/12/15 0701  WBC 11.9* 5.9  NEUTROABS 10.7*  --   HGB 12.2 12.5  HCT 37.2 38.3  MCV 93.5 92.5  PLT 112*  116*   CBG:  Recent Labs Lab 06/12/15 0357 06/12/15 0614 06/12/15 0758 06/12/15 1046 06/12/15 1123  GLUCAP 126* 78 98 104* 119*   Studies/Results: Dg Chest 2 View  06/11/2015  CLINICAL DATA:  Rhonchi and hypoxia EXAM: CHEST  2 VIEW COMPARISON:  03/19/2012 FINDINGS: Cardiac enlargement. Prominent interstitial lung markings may reflect mild fluid overload. No effusion. Mild atelectasis in the lung bases IMPRESSION: Cardiac enlargement with possible mild fluid overload and interstitial edema. Mild bibasilar atelectasis. Electronically Signed   By: Marlan Palau M.D.   On: 06/11/2015 08:13   Medications:   . dextrose  1 ampule Intravenous Once  . doxercalciferol  1 mcg Intravenous Q M,W,F-HD  . doxercalciferol  1 mcg Intravenous Once in dialysis  . feeding supplement (ENSURE ENLIVE)   Oral QPM  . feeding supplement (NEPRO CARB STEADY)  237 mL Oral BID BM  . guaiFENesin  20 mL Oral 3 times per day  . heparin  5,000 Units Subcutaneous 3 times per day  . lanthanum  2,000 mg Oral TID WC  . loratadine  10 mg Oral Daily  . metoCLOPramide  5 mg Oral QID  . multivitamins with iron  1 tablet Oral Daily  . pantoprazole  40 mg Oral Q12H  . [  START ON 06/13/2015] pneumococcal 23 valent vaccine  0.5 mL Intramuscular Tomorrow-1000  . venlafaxine XR  225 mg Oral Q breakfast   Background: 55 y.o. female with ESRD secondary to DM on HD almost five years who resides at [facility] due to her multiple medical issues. 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 She also has a hx of fungal sepsis 2012, severe leukocytoclastic vasculitis, PEA arrest//VDRF/septic shock with [facility] stay 2013. She missed dialysis 2/15 due to low blood sugars. (Otherwise, attendance is good and she runs her full treatments). She does have intermittently high IDWG and chronic low blood pressures. Had EDW lowered 2.5 kg over the  past month which is unusual for her. She was sent to the ED 2/16  when blood sugar failed to increase by measures at the [facility]. She had received her usual Lantus dose  evening PTA (after being hypoglycemic during the day) and found AM 2/16 with foaming at the mouth with a BS of 21. BS in the ED was 70 after 1 amp of D50- she was placed on a D10 drip CXR showed excess volume, she was requiring 4 L O2. Labs showed elevated WBC of 11.9 with 90% and platelets low at 112K K 4.   Dialysis Orders: Center: [facility] MWF -EDW 4 hr heparin 220 EDW 74.5 2 K 2.25 Ca profile 4 36 degrees AVF Hectorol 1 decreased 2/9 no Mircera or Fe - last Mircera was 50 q 2 weeks 2/1  Recent labs: hgb 13.7 19% sat ferritin 1524 iPTH 87 down from 202 Ca 9.9 P 5.3 alb 3.7 last WBC 8K plts 166 K  Hgb A1c 6.1 1/25, 8.5 10/26 and 7.7 7/27   Assessment/Plan: 1. Refractory hypoglycemia/DM - ? Etiology - secondary infection; last hgb A1c 6.1 at outpt unit in January - losing wt - may need less lantus Most recent BS 119; still on D10 at 50 /hr = 600 cc per day 2. ESRD - MWF - missed HD Wed due to hypoglycemia - had HD 2/16. Today is usual day but I think would be OK to wait until tomorrow then get back on schedule after that  Has a little exp wheeze but sats good and lying supine on RA 3. Hypertension/volume - prone to high IDWG at times but other times volumes are good - volume up in part due to missed HD - able to get to edw recently; has been taking 12.5 MTP on nonHD days 4. Anemia - hgb 12.2 - no ESA or Fe 5. Metabolic bone disease - Ca 9.1 alb 3.3 - continue Hectorol 1/fosrenol 6. Nutrition - continue renal carb mod diet/vit/added nepro- generally eats fairly well; prone to high IDWG 7. Thrombocytopenia - follow- normal at baseline    Camille Bal, MD [facility] [phone] pager 06/12/2015, 1:02 PM

## 2015-06-13 DIAGNOSIS — E11649 Type 2 diabetes mellitus with hypoglycemia without coma: Secondary | ICD-10-CM | POA: Diagnosis not present

## 2015-06-13 LAB — RENAL FUNCTION PANEL
ALBUMIN: 3.2 g/dL — AB (ref 3.5–5.0)
ANION GAP: 20 — AB (ref 5–15)
BUN: 34 mg/dL — ABNORMAL HIGH (ref 6–20)
CALCIUM: 9.6 mg/dL (ref 8.9–10.3)
CO2: 24 mmol/L (ref 22–32)
Chloride: 94 mmol/L — ABNORMAL LOW (ref 101–111)
Creatinine, Ser: 5.72 mg/dL — ABNORMAL HIGH (ref 0.44–1.00)
GFR calc non Af Amer: 8 mL/min — ABNORMAL LOW (ref >60)
GFR, EST AFRICAN AMERICAN: 9 mL/min — AB (ref >60)
GLUCOSE: 112 mg/dL — AB (ref 65–99)
PHOSPHORUS: 3.3 mg/dL (ref 2.5–4.6)
POTASSIUM: 4.1 mmol/L (ref 3.5–5.1)
SODIUM: 138 mmol/L (ref 135–145)

## 2015-06-13 LAB — GLUCOSE, CAPILLARY
GLUCOSE-CAPILLARY: 104 mg/dL — AB (ref 65–99)
GLUCOSE-CAPILLARY: 105 mg/dL — AB (ref 65–99)
GLUCOSE-CAPILLARY: 106 mg/dL — AB (ref 65–99)
GLUCOSE-CAPILLARY: 177 mg/dL — AB (ref 65–99)
Glucose-Capillary: 95 mg/dL (ref 65–99)
Glucose-Capillary: 102 mg/dL — ABNORMAL HIGH (ref 65–99)
Glucose-Capillary: 95 mg/dL (ref 65–99)
Glucose-Capillary: 109 mg/dL — ABNORMAL HIGH (ref 65–99)
Glucose-Capillary: 182 mg/dL — ABNORMAL HIGH (ref 65–99)

## 2015-06-13 LAB — CBC
HCT: 39.1 % (ref 36.0–46.0)
HEMOGLOBIN: 12.8 g/dL (ref 12.0–15.0)
MCH: 30.3 pg (ref 26.0–34.0)
MCHC: 32.7 g/dL (ref 30.0–36.0)
MCV: 92.4 fL (ref 78.0–100.0)
Platelets: 140 10*3/uL — ABNORMAL LOW (ref 150–400)
RBC: 4.23 MIL/uL (ref 3.87–5.11)
RDW: 16.1 % — ABNORMAL HIGH (ref 11.5–15.5)
WBC: 5.2 10*3/uL (ref 4.0–10.5)

## 2015-06-13 LAB — MRSA PCR SCREENING: MRSA BY PCR: NEGATIVE

## 2015-06-13 NOTE — NC FL2 (Signed)
[place] MEDICAID FL2 LEVEL OF CARE SCREENING TOOL     IDENTIFICATION  Patient Name: [name] Birthdate: 09/16/1960 Sex: female [name](Current Location): 06/11/2015  [facility] and [place] Number:  Producer, television/film/video and Address:  [facility] [place], [address] [place], [place] [address]      Provider Number: [id number]  Attending Physician Name and Address:  Clydia Llano, MD  Relative Name and Phone Number:       Current Level of Care: Hospital Recommended Level of Care: Skilled Nursing Facility (return to SNF) Prior Approval Number:    Date Approved/Denied:   PASRR Number:    Discharge Plan: SNF    Current Diagnoses: Patient Active Problem List   Diagnosis Date Noted  . Hypoglycemia due to type 2 diabetes mellitus (HCC) 06/11/2015  . Pulmonary edema 06/11/2015  . S/P AKA (above knee amputation) unilateral (HCC) 05/19/2015  . 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 Depression 03/13/2015  . Prolonged Q-T interval on ECG 03/13/2015  . Type 2 diabetes mellitus, controlled, with renal complications (HCC) 12/12/2014  . Unspecified local infection of skin and subcutaneous tissue   . Chronic anemia 12/10/2014  . Ulcer of lower limb, unspecified 01/22/2013  . Mechanical complication of other vascular device, implant, and graft 05/01/2012  . Seizure-like activity (HCC) 03/19/2012  . Thrombocytopenia (HCC) 01/24/2012  . Acute respiratory failure with hypoxia (HCC) 01/23/2012  . HTN (hypertension) 03/01/2011  . ESRD (end stage renal disease) on dialysis (HCC) 03/01/2011  . Obesities, morbid (HCC) 03/01/2011  . Osteomyelitis (HCC) 03/01/2011  . Secondary hyperparathyroidism (of renal origin) 03/01/2011  . Complication of vascular access for dialysis 03/01/2011    Orientation RESPIRATION BLADDER Height & Weight     Self, Time, Situation, Place  O2 (2L) Continent Weight: 158 lb 1.1 oz (71.7  kg) Height:  4' 2.5" (128.3 cm) (bilateral amputation)  BEHAVIORAL SYMPTOMS/MOOD NEUROLOGICAL BOWEL NUTRITION STATUS      Continent Diet (renal )  AMBULATORY STATUS COMMUNICATION OF NEEDS Skin   Extensive Assist Verbally Normal                       Personal Care Assistance Level of Assistance  Bathing, Dressing, Feeding Bathing Assistance: Limited assistance Feeding assistance: Independent Dressing Assistance: Limited assistance     Functional Limitations Info             SPECIAL CARE FACTORS FREQUENCY                       Contractures Contractures Info: Not present    Additional Factors Info  Code Status, Allergies, Psychotropic, Insulin Sliding Scale, Isolation Precautions Code Status Info: Full Code  Allergies Info: Codeine, Levofloxacin, Penicillins Psychotropic Info: Effexor Insulin Sliding Scale Info: NA Isolation Precautions Info: none     Current Medications (06/13/2015):  This is the current hospital active medication list Current Facility-Administered Medications  Medication Dose Route Frequency Provider Last Rate Last Dose  . acetaminophen (TYLENOL) tablet 650 mg  650 mg Oral Q6H PRN Joseph Art, DO   650 mg at 06/11/15 2139   Or  . acetaminophen (TYLENOL) suppository 650 mg  650 mg Rectal Q6H PRN Joseph Art, DO      . dextrose 50 % solution 50 mL  1 ampule Intravenous Once Joseph Art, DO   50 mL at 06/11/15 1215  . doxercalciferol (HECTOROL) injection 1 mcg  1 mcg Intravenous Q M,W,F-HD Weston Settle, PA-C      . doxercalciferol (HECTOROL) injection 1 mcg  1 mcg Intravenous Once in dialysis Weston Settle, PA-C      . feeding supplement (ENSURE ENLIVE) (ENSURE ENLIVE) liquid   Oral QPM Joseph Art, DO      . feeding supplement (NEPRO CARB STEADY) liquid 237 mL  237 mL Oral BID BM Weston Settle, PA-C   237 mL at 06/13/15 1000  . guaiFENesin (ROBITUSSIN) 100 MG/5ML solution 400 mg  20 mL Oral 3 times per day Joseph Art, DO    400 mg at 06/13/15 1500  . heparin injection 5,000 Units  5,000 Units Subcutaneous 3 times per day Joseph Art, DO   5,000 Units at 06/13/15 1503  . HYDROcodone-acetaminophen (NORCO/VICODIN) 5-325 MG per tablet 1-2 tablet  1-2 tablet Oral Q6H PRN Joseph Art, DO      . lanthanum (FOSRENOL) chewable tablet 2,000 mg  2,000 mg Oral TID WC Joseph Art, DO   2,000 mg at 06/13/15 1209  . loratadine (CLARITIN) tablet 10 mg  10 mg Oral Daily Joseph Art, DO   10 mg at 06/13/15 1208  . meclizine (ANTIVERT) tablet 25 mg  25 mg Oral TID PRN Joseph Art, DO      . metoCLOPramide (REGLAN) tablet 5 mg  5 mg Oral QID Joseph Art, DO   5 mg at 06/13/15 1208  . multivitamins with iron tablet 1 tablet  1 tablet Oral Daily Joseph Art, DO   1 tablet at 06/13/15 1500  . ondansetron (ZOFRAN) tablet 4 mg  4 mg Oral Q6H PRN Joseph Art, DO       Or  . ondansetron (ZOFRAN) injection 4 mg  4 mg Intravenous Q6H PRN Joseph Art, DO      . pantoprazole (PROTONIX) EC tablet 40 mg  40 mg Oral Q12H Joseph Art, DO   40 mg at 06/13/15 1208  . venlafaxine XR (EFFEXOR-XR) 24 hr capsule 225 mg  225 mg Oral Q breakfast Joseph Art, DO   225 mg at 06/13/15 1206     Discharge Medications: Please see discharge summary for a list of discharge medications.  Relevant Imaging Results:  Relevant Lab Results:   Additional Information Tues; Thurs; Sat; [facility] [place], [place]

## 2015-06-13 NOTE — Progress Notes (Signed)
[facility] PROGRESS NOTE  [name] ZOX:[id number] DOB: 1960/08/19 DOA: 06/11/2015 PCP: Irena Cords, MD    HPI/Subjective: No events overnight, no IV dextrose or insulin given. Appears to be holding blood pressure okay with oral intake.  Assessment/Plan:  Hypoglycemia/DM2  Presented from NH after being found on the floor foaming at the mouth with blood glucose of 21.  She was given 1 amp of D50 in the ED which raised it to 70. Then started on D10 drip.  Appears to be having normal blood glucose, will decide if she needs insulin on discharge  ESRD - dialysis scheduled on MWF, missed Wed due to hypoglycemia, pt dialyzed yesterday with 2-3L removed, plan to dialyze again today and start back on regular schedule next week.   Pulmonary edema  CXR showed fluid overload, was requiring 4L O2, improved after dialysis and currently off of oxygen with no complaints other than cough, wheezes/rhonchi present on exam, dialyze again on Saturday and back to her regular schedule. This is resolved after dialysis.  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 Patient resides in a nursing home, will have CSW to evaluate for return.  Code Status: Full code Family Communication: None Disposition Plan: Admit   Consultants:  Nephrology  Procedures:  None  Antibiotics:  None   Objective: Filed Vitals:   06/13/15 1110 06/13/15 1200  BP: 107/55 98/65  Pulse: 101 103  Temp: 97.7 F (36.5 C) 97.6 F (36.4 C)  Resp: 18 18    Intake/Output Summary (Last 24 hours) at 06/13/15 1224 Last data filed at 06/13/15 1110  Gross per 24 hour  Intake    480 ml  Output   1600 ml  Net  -1120 ml   Filed Weights   06/13/15 0405 06/13/15 0710 06/13/15 1110  Weight: 73.3 kg (161 lb 9.6 oz) 73.6 kg (162 lb 4.1 oz) 71.7 kg (158 lb 1.1 oz)    Exam:   General:  Alert and oriented, resting comfortably  Cardiovascular: RRR  Respiratory:  Coarse breath sounds, wheezing/rhonchi, in no acute respiratory distress  Abdomen: Soft, non-tender  Musculoskeletal: amputee: left BKA, right AKA, left hand amputation, right 3rd digit amputation  Data Reviewed: Basic Metabolic Panel:  Recent Labs Lab 06/11/15 0722 06/12/15 0701 06/13/15 0751  NA 138 133* 138  K 4.0 4.2 4.1  CL 92* 93* 94*  CO2 GLUCOSE 159* 66 112*  BUN 48* 20 34*  CREATININE 6.86* 3.86* 5.72*  CALCIUM 9.1 8.5* 9.6  PHOS  --   --  3.3   Liver Function Tests:  Recent Labs Lab 06/11/15 0722 06/13/15 0751  AST 30  --   ALT 18  --   ALKPHOS 96  --   BILITOT 0.9  --   PROT 7.5  --   ALBUMIN 3.3* 3.2*   No results for input(s): LIPASE, AMYLASE in the last 168 hours. No results for input(s): AMMONIA in the last 168 hours. CBC:  Recent Labs Lab 06/11/15 0722 06/12/15 0701 06/13/15 0751  WBC 11.9* 5.9 5.2  NEUTROABS 10.7*  --   --   HGB 12.2 12.5 12.8  HCT 37.2 38.3 39.1  MCV 93.5 92.5 92.4  PLT 112* 116* 140*   Cardiac Enzymes: No results for input(s): CKTOTAL, CKMB, CKMBINDEX, TROPONINI in the last 168 hours. BNP (last 3 results) No results for input(s): BNP in the last 8760 hours.  ProBNP (last 3 results) No results for input(s): PROBNP  in the last 8760 hours.  CBG:  Recent Labs Lab 06/12/15 2157 06/12/15 2355 06/13/15 0403 06/13/15 0722 06/13/15 0903  GLUCAP 95 106* 95 109* 105*    Recent Results (from the past 240 hour(s))  Urine culture     Status: None   Collection Time: 06/11/15  7:40 AM  Result Value Ref Range Status   Specimen Description URINE, CATHETERIZED  Final   Special Requests NONE  Final   Culture NO GROWTH 1 DAY  Final   Report Status 06/12/2015 FINAL  Final     Studies: No results found.  Scheduled Meds: . dextrose  1 ampule Intravenous Once  . doxercalciferol  1 mcg Intravenous Q M,W,F-HD  . doxercalciferol  1 mcg Intravenous Once in dialysis  . feeding supplement (ENSURE ENLIVE)   Oral  QPM  . feeding supplement (NEPRO CARB STEADY)  237 mL Oral BID BM  . guaiFENesin  20 mL Oral 3 times per day  . heparin  5,000 Units Subcutaneous 3 times per day  . lanthanum  2,000 mg Oral TID WC  . loratadine  10 mg Oral Daily  . metoCLOPramide  5 mg Oral QID  . multivitamins with iron  1 tablet Oral Daily  . pantoprazole  40 mg Oral Q12H  . pneumococcal 23 valent vaccine  0.5 mL Intramuscular Tomorrow-1000  . venlafaxine XR  225 mg Oral Q breakfast   Continuous Infusions:   Principal Problem:   Hypoglycemia due to type 2 diabetes mellitus (HCC) Active Problems:   HTN (hypertension)   ESRD (end stage renal disease) on dialysis (HCC)   Obesities, morbid (HCC)   Acute respiratory failure with hypoxia (HCC)   Thrombocytopenia (HCC)   Chronic anemia   Depression   Prolonged Q-T interval on ECG   Pulmonary edema     Cortez Flippen A PA-S wrote parts of this note [facility] Pager [phone]. If 7PM-7AM, please contact night-coverage at [email or web address], password [facility] 06/13/2015, 12:24 PM  LOS: 2 days     Clint Lipps Pager: [phone] 06/13/2015, 12:24 PM

## 2015-06-13 NOTE — Procedures (Signed)
I have personally attended this patient's dialysis session.  2K bath K 4.1 UF goal 2-3 liters BP stable so far Weight looks discrepant.  Camille Bal, MD [facility] [phone] Pager 06/13/2015, 10:40 AM

## 2015-06-13 NOTE — Progress Notes (Signed)
[facility] Rounding Note  Subjective:  Says she feels much better  No more wheezing or SOB Seen in the HD unit  Objective Vital signs in last 24 hours: Filed Vitals:   06/13/15 0912 06/13/15 0915 06/13/15 0931 06/13/15 0956  BP: 79/55 121/45 108/21 119/90  Pulse: 105 104 92 104  Temp:      TempSrc:      Resp:      Weight:      SpO2:       Weight change: -2.5 kg (-5 lb 8.2 oz)  Intake/Output Summary (Last 24 hours) at 06/13/15 1026 Last data filed at 06/13/15 0600  Gross per 24 hour  Intake    480 ml  Output      0 ml  Net    480 ml   Physical Exam:  BP 119/90 mmHg  Pulse 104  Temp(Src) 97.4 F (36.3 C) (Oral)  Resp 15  Wt 66.2 kg (145 lb 15.1 oz)  SpO2 100%  General: WF NAD on HD Head: Normocephalic, atraumatic,  Lungs: Anteriorly clear Heart: Regular NormalS1S2 No S3 Abdomen: obese Soft, non-tender, non-distended with normoactive bowel sounds. Extremities: left BKA right AKA, left hand amputation - well healed- several scabbed areas on left arm - no overt infections/s/p amp of right 3rd finger; no edema of stumps Neuro: Alert and oriented X 3.  Psych: Responds to questions appropriately with usual affect at baseline Dialysis Access: AVF left upper + bruit and thrill cannulated for HD  Labs: Basic Metabolic Panel:  Recent Labs Lab 06/11/15 0722 06/12/15 0701 06/13/15 0751  NA 138 133* 138  K 4.0 4.2 4.1  CL 92* 93* 94*  CO2 GLUCOSE 159* 66 112*  BUN 48* 20 34*  CREATININE 6.86* 3.86* 5.72*  CALCIUM 9.1 8.5* 9.6  PHOS  --   --  3.3   Liver Function Tests:  Recent Labs Lab 06/11/15 0722 06/13/15 0751  AST 30  --   ALT 18  --   ALKPHOS 96  --   BILITOT 0.9  --   PROT 7.5  --   ALBUMIN 3.3* 3.2*     Recent Labs Lab 06/11/15 0722 06/12/15 0701 06/13/15 0751  WBC 11.9* 5.9 5.2  NEUTROABS 10.7*  --   --   HGB 12.2 12.5 12.8  HCT 37.2 38.3 39.1  MCV 93.5 92.5 92.4  PLT 112* 116* 140*   CBG:  Recent Labs Lab 06/12/15 2157  06/12/15 2355 06/13/15 0403 06/13/15 0722 06/13/15 0903  GLUCAP 95 106* 95 109* 105*   Medications:   . dextrose  1 ampule Intravenous Once  . doxercalciferol  1 mcg Intravenous Q M,W,F-HD  . doxercalciferol  1 mcg Intravenous Once in dialysis  . feeding supplement (ENSURE ENLIVE)   Oral QPM  . feeding supplement (NEPRO CARB STEADY)  237 mL Oral BID BM  . guaiFENesin  20 mL Oral 3 times per day  . heparin  5,000 Units Subcutaneous 3 times per day  . lanthanum  2,000 mg Oral TID WC  . loratadine  10 mg Oral Daily  . metoCLOPramide  5 mg Oral QID  . multivitamins with iron  1 tablet Oral Daily  . pantoprazole  40 mg Oral Q12H  . pneumococcal 23 valent vaccine  0.5 mL Intramuscular Tomorrow-1000  . venlafaxine XR  225 mg Oral Q breakfast    Background: 55 y.o. female with ESRD secondary to DM on HD almost five years who resides at [facility] due to her multiple medical issues. 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 She also has a hx of fungal sepsis 2012, severe leukocytoclastic vasculitis, PEA arrest//VDRF/septic shock with [facility] stay 2013. She missed dialysis 2/15 due to low blood sugars. (Otherwise, attendance is good and she runs her full treatments). She does have intermittently high IDWG and chronic low blood pressures. Had EDW lowered 2.5 kg over the past month which is unusual for her. She was sent to the ED 2/16  when blood sugar failed to increase by measures at the [facility]. She had received her usual Lantus dose  evening PTA (after being hypoglycemic during the day) and found AM 2/16 with foaming at the mouth with a BS of 21. BS in the ED was 70 after 1 amp of D50- she was placed on a D10 drip CXR showed excess volume, she was requiring 4 L O2. Labs showed elevated WBC of 11.9 with 90% and platelets low at 112K K 4.   Dialysis Orders: Center: [facility] MWF -EDW 4 hr heparin 220 EDW  74.5 2 K 2.25 Ca profile 4 36 degrees AVF Hectorol 1 decreased 2/9 no Mircera or Fe - last Mircera was 50 q 2 weeks 2/1  Recent labs: hgb 13.7 19% sat ferritin 1524 iPTH 87 down from 202 Ca 9.9 P 5.3 alb 3.7 last WBC 8K plts 166 K  Hgb A1c 6.1 1/25, 8.5 10/26 and 7.7 7/27   Assessment/Plan: 1. Refractory hypoglycemia/DM - ? Etiology - secondary infection; last hgb A1c 6.1 at outpt unit in January - losing wt - may need less lantus Most recent BS 105. Off D10 drip now. Eating. No standing insulin dose right now (no insulin since admission) 2. ESRD - MWF - got off schedule d/t missed TMT pre admission and need for urgent HD at admission. Will get back on schedule on Monday. Weight today looks discrepant - but difficult as bed weights and she obviously cannot stand UF goal is 2-3L. 3. Hypertension/volume - prone to high IDWG at times but other times volumes are good - volume up in part due to missed HD on admission. Not sure where her weight really is ow but exam wise lungs sound better and clinically no SOB.   4. Anemia - hgb 12.8 - no ESA or Fe 5. Metabolic bone disease - continue Hectorol 1/fosrenol 6. Nutrition - continue renal carb mod diet/vit/added nepro- generally eats fairly well; prone to high IDWG 7. Thrombocytopenia - follow- normal at baseline. Seems to be improving.     Camille Bal, MD [facility] [phone] pager 06/13/2015, 10:26 AM

## 2015-06-14 DIAGNOSIS — J810 Acute pulmonary edema: Secondary | ICD-10-CM

## 2015-06-14 LAB — GLUCOSE, CAPILLARY
GLUCOSE-CAPILLARY: 233 mg/dL — AB (ref 65–99)
Glucose-Capillary: 171 mg/dL — ABNORMAL HIGH (ref 65–99)
Glucose-Capillary: 213 mg/dL — ABNORMAL HIGH (ref 65–99)
Glucose-Capillary: 171 mg/dL — ABNORMAL HIGH (ref 65–99)

## 2015-06-14 MED ORDER — INSULIN GLARGINE 100 UNIT/ML ~~LOC~~ SOLN
10.0000 [IU] | Freq: Every day | SUBCUTANEOUS | Status: DC
  Administered 2015-06-14: 10 [IU] via SUBCUTANEOUS
  Filled 2015-06-14 (×2): qty 0.1

## 2015-06-14 MED ORDER — INSULIN ASPART 100 UNIT/ML ~~LOC~~ SOLN
0.0000 [IU] | Freq: Three times a day (TID) | SUBCUTANEOUS | Status: DC
  Administered 2015-06-14: 3 [IU] via SUBCUTANEOUS
  Administered 2015-06-15: 1 [IU] via SUBCUTANEOUS

## 2015-06-14 MED ORDER — CAMPHOR-MENTHOL 0.5-0.5 % EX LOTN
TOPICAL_LOTION | CUTANEOUS | Status: DC | PRN
  Administered 2015-06-14: 17:00:00 via TOPICAL
  Filled 2015-06-14: qty 222

## 2015-06-14 NOTE — Progress Notes (Signed)
[facility] PROGRESS NOTE  [name] JYN:[id number] DOB: 1961-02-25 DOA: 06/11/2015 PCP: Irena Cords, MD    HPI/Subjective: She is back to her regular self, blood sugar started to be elevated. I will start Lantus insulin at very small dose. She is from a nursing home, she'll be appropriate for discharge in a.m.  Assessment/Plan:  Hypoglycemia/DM2  Presented from NH after being found on the floor foaming at the mouth with blood glucose of 21.  She was given 1 amp of D50 in the ED which raised it to 70. Then started on D10 drip.  Hemoglobin A1c is 6.0, indicating very tight glycemic control, that probably comes with a price of more  ESRD - dialysis scheduled on MWF, missed Wed due to hypoglycemia, pt dialyzed yesterday with 2-3L removed, plan to dialyze again today and start back on regular schedule next week.   Pulmonary edema  CXR showed fluid overload, was requiring 4L O2, improved after dialysis and currently off of oxygen with no complaints other than cough, wheezes/rhonchi present on exam, dialyze again on Saturday and back to her regular schedule. This is resolved after dialysis.  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 Patient resides in a nursing home, will have CSW to evaluate for return.  Code Status: Full code Family Communication: None Disposition Plan: Admit   Consultants:  Nephrology  Procedures:  None  Antibiotics:  None   Objective: Filed Vitals:   06/14/15 0417 06/14/15 0951  BP: 101/34 101/44  Pulse: 97 100  Temp: 98.3 F (36.8 C) 98 F (36.7 C)  Resp: 20 18    Intake/Output Summary (Last 24 hours) at 06/14/15 1508 Last data filed at 06/14/15 0650  Gross per 24 hour  Intake      0 ml  Output      0 ml  Net      0 ml   Filed Weights   06/13/15 0710 06/13/15 1110 06/13/15 2027  Weight: 73.6 kg (162 lb 4.1 oz) 71.7 kg (158 lb 1.1 oz) 71.5 kg (157 lb 10.1 oz)     Exam:   General:  Alert and oriented, resting comfortably  Cardiovascular: RRR  Respiratory: Coarse breath sounds, wheezing/rhonchi, in no acute respiratory distress  Abdomen: Soft, non-tender  Musculoskeletal: amputee: left BKA, right AKA, left hand amputation, right 3rd digit amputation  Data Reviewed: Basic Metabolic Panel:  Recent Labs Lab 06/11/15 0722 06/12/15 0701 06/13/15 0751  NA 138 133* 138  K 4.0 4.2 4.1  CL 92* 93* 94*  CO2 GLUCOSE 159* 66 112*  BUN 48* 20 34*  CREATININE 6.86* 3.86* 5.72*  CALCIUM 9.1 8.5* 9.6  PHOS  --   --  3.3   Liver Function Tests:  Recent Labs Lab 06/11/15 0722 06/13/15 0751  AST 30  --   ALT 18  --   ALKPHOS 96  --   BILITOT 0.9  --   PROT 7.5  --   ALBUMIN 3.3* 3.2*   No results for input(s): LIPASE, AMYLASE in the last 168 hours. No results for input(s): AMMONIA in the last 168 hours. CBC:  Recent Labs Lab 06/11/15 0722 06/12/15 0701 06/13/15 0751  WBC 11.9* 5.9 5.2  NEUTROABS 10.7*  --   --   HGB 12.2 12.5 12.8  HCT 37.2 38.3 39.1  MCV 93.5 92.5 92.4  PLT 112* 116* 140*   Cardiac Enzymes: No results for input(s): CKTOTAL, CKMB, CKMBINDEX, TROPONINI in the  last 168 hours. BNP (last 3 results) No results for input(s): BNP in the last 8760 hours.  ProBNP (last 3 results) No results for input(s): PROBNP in the last 8760 hours.  CBG:  Recent Labs Lab 06/13/15 1150 06/13/15 1736 06/13/15 2026 06/14/15 0756 06/14/15 1210  GLUCAP 104* 182* 177* 171* 233*    Recent Results (from the past 240 hour(s))  Urine culture     Status: None   Collection Time: 06/11/15  7:40 AM  Result Value Ref Range Status   Specimen Description URINE, CATHETERIZED  Final   Special Requests NONE  Final   Culture NO GROWTH 1 DAY  Final   Report Status 06/12/2015 FINAL  Final  MRSA PCR Screening     Status: None   Collection Time: 06/13/15  3:30 PM  Result Value Ref Range Status   MRSA by PCR NEGATIVE  NEGATIVE Final    Comment:        The GeneXpert MRSA Assay (FDA approved for NASAL specimens only), is one component of a comprehensive MRSA colonization surveillance program. It is not intended to diagnose MRSA infection nor to guide or monitor treatment for MRSA infections.      Studies: No results found.  Scheduled Meds: . dextrose  1 ampule Intravenous Once  . doxercalciferol  1 mcg Intravenous Q M,W,F-HD  . doxercalciferol  1 mcg Intravenous Once in dialysis  . feeding supplement (ENSURE ENLIVE)   Oral QPM  . feeding supplement (NEPRO CARB STEADY)  237 mL Oral BID BM  . guaiFENesin  20 mL Oral 3 times per day  . heparin  5,000 Units Subcutaneous 3 times per day  . lanthanum  2,000 mg Oral TID WC  . loratadine  10 mg Oral Daily  . metoCLOPramide  5 mg Oral QID  . multivitamins with iron  1 tablet Oral Daily  . pantoprazole  40 mg Oral Q12H  . venlafaxine XR  225 mg Oral Q breakfast   Continuous Infusions:   Principal Problem:   Hypoglycemia due to type 2 diabetes mellitus (HCC) Active Problems:   HTN (hypertension)   ESRD (end stage renal disease) on dialysis (HCC)   Obesities, morbid (HCC)   Acute respiratory failure with hypoxia (HCC)   Thrombocytopenia (HCC)   Chronic anemia   Depression   Prolonged Q-T interval on ECG   Pulmonary edema     [name] A PA-S wrote parts of this note [facility] Pager [phone]. If 7PM-7AM, please contact night-coverage at [email or web address], password [facility] 06/14/2015, 3:08 PM  LOS: 3 days     Clint Lipps Pager: [phone] 06/14/2015, 3:08 PM

## 2015-06-14 NOTE — Progress Notes (Signed)
[facility] Rounding Note  Subjective:  Feels fine Husband here today Had HD yesterday Post weight 71.1  Objective Vital signs in last 24 hours: Filed Vitals:   06/13/15 1700 06/13/15 2027 06/14/15 0417 06/14/15 0951  BP: 109/61 92/80 101/34 101/44  Pulse: 90 98 97 100  Temp: 97.6 F (36.4 C) 97.7 F (36.5 C) 98.3 F (36.8 C) 98 F (36.7 C)  TempSrc: Oral Oral Oral Oral  Resp: Height:      Weight:  71.5 kg (157 lb 10.1 oz)    SpO2: 95% 96% 100% 100%   Weight change: 0.3 kg (10.6 oz)  Intake/Output Summary (Last 24 hours) at 06/14/15 1308 Last data filed at 06/14/15 0650  Gross per 24 hour  Intake    120 ml  Output      0 ml  Net    120 ml   Physical Exam:  BP 101/44 mmHg  Pulse 100  Temp(Src) 98 F (36.7 C) (Oral)  Resp 18  Ht 4' 2.5" (1.283 m)  Wt 71.5 kg (157 lb 10.1 oz)  BMI 43.44 kg/m2  SpO2 100%  General: WF NAD on HD Head: Normocephalic, atraumatic,  Lungs: Anteriorly clear Heart: Regular NormalS1S2 No S3 Abdomen: obese Soft, non-tender, non-distended with normoactive bowel sounds. Extremities: left BKA right AKA, left hand amputation - well healed- several scabbed areas on left arm - no overt infections/s/p amp of right 3rd finger; no edema of stumps Neuro: Alert and oriented X 3.  Psych: Responds to questions appropriately with usual affect at baseline Dialysis Access: AVF left upper + bruit and thrill cannulated for HD  Labs: Basic Metabolic Panel:  Recent Labs Lab 06/11/15 0722 06/12/15 0701 06/13/15 0751  NA 138 133* 138  K 4.0 4.2 4.1  CL 92* 93* 94*  CO2 GLUCOSE 159* 66 112*  BUN 48* 20 34*  CREATININE 6.86* 3.86* 5.72*  CALCIUM 9.1 8.5* 9.6  PHOS  --   --  3.3   Liver Function Tests:  Recent Labs Lab 06/11/15 0722 06/13/15 0751  AST 30  --   ALT 18  --   ALKPHOS 96  --   BILITOT 0.9  --   PROT 7.5  --   ALBUMIN 3.3* 3.2*     Recent Labs Lab 06/11/15 0722 06/12/15 0701 06/13/15 0751  WBC 11.9*  5.9 5.2  NEUTROABS 10.7*  --   --   HGB 12.2 12.5 12.8  HCT 37.2 38.3 39.1  MCV 93.5 92.5 92.4  PLT 112* 116* 140*   CBG:  Recent Labs Lab 06/13/15 1150 06/13/15 1736 06/13/15 2026 06/14/15 0756 06/14/15 1210  GLUCAP 104* 182* 177* 171* 233*   Medications:   . dextrose  1 ampule Intravenous Once  . doxercalciferol  1 mcg Intravenous Q M,W,F-HD  . doxercalciferol  1 mcg Intravenous Once in dialysis  . feeding supplement (ENSURE ENLIVE)   Oral QPM  . feeding supplement (NEPRO CARB STEADY)  237 mL Oral BID BM  . guaiFENesin  20 mL Oral 3 times per day  . heparin  5,000 Units Subcutaneous 3 times per day  . lanthanum  2,000 mg Oral TID WC  . loratadine  10 mg Oral Daily  . metoCLOPramide  5 mg Oral QID  . multivitamins with iron  1 tablet Oral Daily  . pantoprazole  40 mg Oral Q12H  . venlafaxine XR  225 mg Oral Q breakfast    Background: 55 y.o. female  with ESRD secondary to DM on HD almost five years who resides at [facility] due to her multiple medical issues. 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 She also has a hx of fungal sepsis 2012, severe leukocytoclastic vasculitis, PEA arrest//VDRF/septic shock with [facility] stay 2013. She missed dialysis 2/15 due to low blood sugars. (Otherwise, attendance is good and she runs her full treatments). She does have intermittently high IDWG and chronic low blood pressures. Had EDW lowered 2.5 kg over the past month which is unusual for her. She was sent to the ED 2/16  when blood sugar failed to increase by measures at the [facility]. She had received her usual Lantus dose  evening PTA (after being hypoglycemic during the day) and found AM 2/16 with foaming at the mouth with a BS of 21. BS in the ED was 70 after 1 amp of D50- she was placed on a D10 drip CXR showed excess volume, she was requiring 4 L O2. Labs showed elevated WBC of 11.9 with 90%  and platelets low at 112K K 4.   Dialysis Orders: Center: [facility] MWF -EDW 4 hr heparin 220 EDW 74.5 2 K 2.25 Ca profile 4 36 degrees AVF Hectorol 1 decreased 2/9 no Mircera or Fe - last Mircera was 50 q 2 weeks 2/1  Recent labs: hgb 13.7 19% sat ferritin 1524 iPTH 87 down from 202 Ca 9.9 P 5.3 alb 3.7 last WBC 8K plts 166 K  Hgb A1c 6.1 1/25, 8.5 10/26 and 7.7 7/27   Assessment/Plan: 1. Refractory hypoglycemia/DM - ? Etiology -  last hgb A1c 6.1 at outpt unit in January. Has lost weight. Off D10 drip. Eating. Has had no insulin since admission - ? If still needs. Insulin requirements may have decreased with weight loss and ESRD 2. ESRD - MWF - got off schedule d/t missed TMT pre admission and need for urgent HD at admission. Will get back on schedule on Monday.  3. Hypertension/volume -  Appears post weight yesterday was 71.7 and EDW 74.5  4. Anemia - hgb 12.8 - no ESA or Fe 5. Metabolic bone disease - continue Hectorol 1/fosrenol 6. Nutrition - continue renal carb mod diet/vit/added nepro- generally eats fairly well; prone to high IDWG 7. Thrombocytopenia - follow- normal at baseline. Seems to be improving.     Camille Bal, MD [facility] [phone] pager 06/14/2015, 1:08 PM

## 2015-06-15 LAB — GLUCOSE, CAPILLARY
GLUCOSE-CAPILLARY: 139 mg/dL — AB (ref 65–99)
GLUCOSE-CAPILLARY: 147 mg/dL — AB (ref 65–99)
Glucose-Capillary: 130 mg/dL — ABNORMAL HIGH (ref 65–99)
Glucose-Capillary: 115 mg/dL — ABNORMAL HIGH (ref 65–99)

## 2015-06-15 LAB — RENAL FUNCTION PANEL
ALBUMIN: 3.4 g/dL — AB (ref 3.5–5.0)
ANION GAP: 19 — AB (ref 5–15)
BUN: 48 mg/dL — AB (ref 6–20)
CALCIUM: 10.5 mg/dL — AB (ref 8.9–10.3)
CO2: 26 mmol/L (ref 22–32)
Chloride: 94 mmol/L — ABNORMAL LOW (ref 101–111)
Creatinine, Ser: 6.34 mg/dL — ABNORMAL HIGH (ref 0.44–1.00)
GFR calc Af Amer: 8 mL/min — ABNORMAL LOW (ref >60)
GFR calc non Af Amer: 7 mL/min — ABNORMAL LOW (ref >60)
GLUCOSE: 141 mg/dL — AB (ref 65–99)
PHOSPHORUS: 2.7 mg/dL (ref 2.5–4.6)
Potassium: 3.8 mmol/L (ref 3.5–5.1)
SODIUM: 139 mmol/L (ref 135–145)

## 2015-06-15 LAB — CBC
HCT: 41.6 % (ref 36.0–46.0)
Hemoglobin: 13.3 g/dL (ref 12.0–15.0)
MCH: 30.2 pg (ref 26.0–34.0)
MCHC: 32 g/dL (ref 30.0–36.0)
MCV: 94.5 fL (ref 78.0–100.0)
Platelets: 188 K/uL (ref 150–400)
RBC: 4.4 MIL/uL (ref 3.87–5.11)
RDW: 16.3 % — ABNORMAL HIGH (ref 11.5–15.5)
WBC: 8.8 K/uL (ref 4.0–10.5)

## 2015-06-15 MED ORDER — HYDROCODONE-ACETAMINOPHEN 5-325 MG PO TABS: 1.0000 | ORAL_TABLET | Freq: Four times a day (QID) | ORAL | Status: DC | PRN

## 2015-06-15 MED ORDER — TRAMADOL HCL 50 MG PO TABS: 50.0000 mg | ORAL_TABLET | Freq: Two times a day (BID) | ORAL | Status: DC | PRN

## 2015-06-15 MED ORDER — DOXERCALCIFEROL 4 MCG/2ML IV SOLN
INTRAVENOUS | Status: AC
  Filled 2015-06-15: qty 2

## 2015-06-15 MED ORDER — INSULIN DETEMIR 100 UNIT/ML ~~LOC~~ SOLN: 10.0000 [IU] | Freq: Every day | SUBCUTANEOUS | Status: DC

## 2015-06-15 NOTE — Discharge Summary (Signed)
Physician Discharge Summary  [name] UJW:[id number] DOB: 09/26/1960 DOA: 06/11/2015  PCP: Irena Cords, MD  Admit date: 06/11/2015 Discharge date: 06/15/2015  Time spent: 40 minutes  Recommendations for Outpatient Follow-up:  1. Follow-up with the nursing home M.D. 2. Lantus insulin dose decreased to 10 units daily at bedtime.   Discharge Diagnoses:  Principal Problem:   Hypoglycemia due to type 2 diabetes mellitus (HCC) Active Problems:   HTN (hypertension)   ESRD (end stage renal disease) on dialysis (HCC)   Obesities, morbid (HCC)   Acute respiratory failure with hypoxia (HCC)   Thrombocytopenia (HCC)   Chronic anemia   Depression   Prolonged Q-T interval on ECG   Pulmonary edema   Discharge Condition: Stable  Diet recommendation: Carbohydrate modified diet/renal diet  Filed Weights   06/13/15 2027 06/14/15 2012 06/15/15 1017  Weight: 71.5 kg (157 lb 10.1 oz) 71.4 kg (157 lb 6.5 oz) 72.2 kg (159 lb 2.8 oz)    History of present illness:  [name] is a 55 y.o. female  With PMHX of ESRD, DM- insulin dependant, anemia, dementia, HTN who resides at [facility]. She had a low blood sugar yesterday and was not sent to dialysis. From reviewing record at [facility], she was given her usual lantus dosage last PM at 2100. She was found this AM to be foaming at her mouth and not responsive. Blood sugar was checked and was 21. She got 3 glucogaon shots per ER record. Patient and family do not report a huge change in appetite, patient says she does not miss meals- sometimes does not like what they serve but eats a sandwich. Patient get lantus as well as meal coverage at the SNF.  In the ER, patient blood sugars dropped to 70 after 1 amp d50. She was then placed on D10 infusion. X ray shows volume overload and patient is requiring 4L O2 in ER (does not wear on a regular basis). Nephrology was called for dialysis.  Hospital Course:     Hypoglycemia/DM2  Presented from NH after being found on the floor foaming at the mouth with blood glucose of 21.  She was given 1 amp of D50 in the ED which raised it to 70. Then started on D10 drip.  Hemoglobin A1c is 6.0, indicating very tight glycemic control, that probably comes with a price of more hypoglycemic episodes. Lantus insulin was withheld while she was in the hospital. On discharge dose decreased to 10 units daily at bedtime.  ESRD - dialysis scheduled on MWF, missed Wed due to hypoglycemia. Nephrology consultant, back regular dialysis days.  Pulmonary edema  CXR showed fluid overload, was requiring 4L O2, improved after dialysis and currently off of oxygen with no complaints other than cough, wheezes/rhonchi present on admission resolved after dialysis., dialyze again on Saturday and back to her regular schedule. This is resolved after dialysis. Explained to the patient to stick to renal diet with fluid restriction.  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 Patient resides in a nursing home, very limited ADLs secondary to triple amputation.  Procedures:  Routine hemodialysis  Consultations:  Nephrology.  Discharge Exam: Filed Vitals:   06/15/15 1105 06/15/15 1120  BP: 77/60 99/45  Pulse: 96 97  Temp:    Resp:     General: Alert and awake, oriented x3, not in any acute distress. HEENT: anicteric sclera, pupils reactive to light and accommodation, EOMI CVS: S1-S2 clear, no murmur rubs  or gallops Chest: clear to auscultation bilaterally, no wheezing, rales or rhonchi Abdomen: soft nontender, nondistended, normal bowel sounds, no organomegaly Extremities: no cyanosis, clubbing or edema noted bilaterally Neuro: Cranial nerves II-XII intact, no focal neurological deficits  Discharge Instructions   Discharge Instructions    Diet - low sodium heart healthy    Complete by:  As directed      Increase activity  slowly    Complete by:  As directed           Current Discharge Medication List    CONTINUE these medications which have CHANGED   Details  HYDROcodone-acetaminophen (NORCO/VICODIN) 5-325 MG tablet Take 1-2 tablets by mouth every 6 (six) hours as needed for moderate pain. Qty: 10 tablet, Refills: 0    insulin detemir (LEVEMIR) 100 UNIT/ML injection Inject 0.1 mLs (10 Units total) into the skin at bedtime. Qty: 10 mL, Refills: 11    traMADol (ULTRAM) 50 MG tablet Take 1 tablet (50 mg total) by mouth every 12 (twelve) hours as needed for moderate pain. Qty: 10 tablet, Refills: 0      CONTINUE these medications which have NOT CHANGED   Details  glucagon (GLUCAGON EMERGENCY) 1 MG injection Inject 1 mg into the vein once as needed (type 2 diabetes).    guaiFENesin (ROBITUSSIN) 100 MG/5ML SOLN Take 20 mLs by mouth every 8 (eight) hours.    guaiFENesin-codeine (ROBITUSSIN AC) 100-10 MG/5ML syrup Take 30 mLs by mouth every 6 (six) hours as needed for cough.    insulin lispro (HUMALOG KWIKPEN) 100 UNIT/ML KiwkPen Inject 5 Units into the skin 2 (two) times daily.    lanthanum (FOSRENOL) 1000 MG chewable tablet Chew 2 tablets (2,000 mg total) by mouth 3 (three) times daily with meals. Qty: 180 tablet, Refills: 0    loratadine (CLARITIN) 10 MG tablet Take 10 mg by mouth daily.    meclizine (ANTIVERT) 25 MG tablet Take 25 mg by mouth 3 (three) times daily as needed for dizziness.    Melatonin 5 MG TABS Take 5 mg by mouth at bedtime.    metoCLOPramide (REGLAN) 5 MG tablet Take 5 mg by mouth 4 (four) times daily.    metoprolol tartrate (LOPRESSOR) 25 MG tablet Take 12.5 mg by mouth See admin instructions. 12.5mg  twice daily on Sunday, Tuesday, Thursday, Saturday    Multiple Vitamins-Iron (MULTIVITAMINS WITH IRON) TABS tablet Take 1 tablet by mouth daily.    Nutritional Supplements (ENSURE CLEAR PO) Take 1 Can by mouth every evening.    pantoprazole (PROTONIX) 40 MG tablet Take 40 mg by  mouth every 12 (twelve) hours.     venlafaxine XR (EFFEXOR-XR) 75 MG 24 hr capsule Take 225 mg by mouth daily with breakfast.       Allergies  Allergen Reactions  . Codeine Hives  . Levofloxacin Other (See Comments)    unknown  . Penicillins Hives      The results of significant diagnostics from this hospitalization (including imaging, microbiology, ancillary and laboratory) are listed below for reference.    Significant Diagnostic Studies: Dg Chest 2 View  06/11/2015  CLINICAL DATA:  Rhonchi and hypoxia EXAM: CHEST  2 VIEW COMPARISON:  03/19/2012 FINDINGS: Cardiac enlargement. Prominent interstitial lung markings may reflect mild fluid overload. No effusion. Mild atelectasis in the lung bases IMPRESSION: Cardiac enlargement with possible mild fluid overload and interstitial edema. Mild bibasilar atelectasis. Electronically Signed   By: Marlan Palau M.D.   On: 06/11/2015 08:13    Microbiology: Recent Results (from  the past 240 hour(s))  Urine culture     Status: None   Collection Time: 06/11/15  7:40 AM  Result Value Ref Range Status   Specimen Description URINE, CATHETERIZED  Final   Special Requests NONE  Final   Culture NO GROWTH 1 DAY  Final   Report Status 06/12/2015 FINAL  Final  MRSA PCR Screening     Status: None   Collection Time: 06/13/15  3:30 PM  Result Value Ref Range Status   MRSA by PCR NEGATIVE NEGATIVE Final    Comment:        The GeneXpert MRSA Assay (FDA approved for NASAL specimens only), is one component of a comprehensive MRSA colonization surveillance program. It is not intended to diagnose MRSA infection nor to guide or monitor treatment for MRSA infections.      Labs: Basic Metabolic Panel:  Recent Labs Lab 06/11/15 0722 06/12/15 0701 06/13/15 0751 06/15/15 1026  NA 138 133* 138 139  K 4.0 4.2 4.1 3.8  CL 92* 93* 94* 94*  CO2 GLUCOSE 159* 66 112* 141*  BUN 48* 20 34* 48*  CREATININE 6.86* 3.86* 5.72* 6.34*   CALCIUM 9.1 8.5* 9.6 10.5*  PHOS  --   --  3.3 2.7   Liver Function Tests:  Recent Labs Lab 06/11/15 0722 06/13/15 0751 06/15/15 1026  AST 30  --   --   ALT 18  --   --   ALKPHOS 96  --   --   BILITOT 0.9  --   --   PROT 7.5  --   --   ALBUMIN 3.3* 3.2* 3.4*   No results for input(s): LIPASE, AMYLASE in the last 168 hours. No results for input(s): AMMONIA in the last 168 hours. CBC:  Recent Labs Lab 06/11/15 0722 06/12/15 0701 06/13/15 0751 06/15/15 1020  WBC 11.9* 5.9 5.2 8.8  NEUTROABS 10.7*  --   --   --   HGB 12.2 12.5 12.8 13.3  HCT 37.2 38.3 39.1 41.6  MCV 93.5 92.5 92.4 94.5  PLT 112* 116* 140* 188   Cardiac Enzymes: No results for input(s): CKTOTAL, CKMB, CKMBINDEX, TROPONINI in the last 168 hours. BNP: BNP (last 3 results) No results for input(s): BNP in the last 8760 hours.  ProBNP (last 3 results) No results for input(s): PROBNP in the last 8760 hours.  CBG:  Recent Labs Lab 06/14/15 0756 06/14/15 1210 06/14/15 1728 06/14/15 2010 06/15/15 0750  GLUCAP 171* 233* 213* 171* 139*       Signed:  Clydia Llano A MD.  [facility] 06/15/2015, 11:32 AM

## 2015-06-15 NOTE — Progress Notes (Signed)
[facility] Rounding Note  Subjective:  Feels fine Husband here today Had HD yesterday Post weight 71.1  Objective Vital signs in last 24 hours: Filed Vitals:   06/15/15 1330 06/15/15 1400 06/15/15 1449 06/15/15 1506  BP: 110/36 109/42 100/31 119/55  Pulse: 93 89 92 92  Temp:    97.8 F (36.6 C)  TempSrc:   Oral Oral  Resp:   15 16  Height:      Weight:   72 kg (158 lb 11.7 oz)   SpO2:   98%    Weight change: -2.2 kg (-4 lb 13.6 oz)  Intake/Output Summary (Last 24 hours) at 06/15/15 1511 Last data filed at 06/15/15 1449  Gross per 24 hour  Intake      0 ml  Output   -157 ml  Net    157 ml   Physical Exam:  BP 119/55 mmHg  Pulse 92  Temp(Src) 97.8 F (36.6 C) (Oral)  Resp 16  Ht 4' 2.5" (1.283 m)  Wt 72 kg (158 lb 11.7 oz)  BMI 43.74 kg/m2  SpO2 98%  General: WF NAD on HD Head: Normocephalic, atraumatic,  Lungs: Anteriorly clear Heart: Regular NormalS1S2 No S3 Abdomen: obese Soft, non-tender, non-distended with normoactive bowel sounds. Extremities: left BKA right AKA, left hand amputation - well healed- several scabbed areas on left arm - no overt infections/s/p amp of right 3rd finger; no edema of stumps Neuro: Alert and oriented X 3.  Psych: Responds to questions appropriately with usual affect at baseline Dialysis Access: AVF left upper + bruit and thrill cannulated for HD  Labs: Basic Metabolic Panel:  Recent Labs Lab 06/11/15 0722 06/12/15 0701 06/13/15 0751 06/15/15 1026  NA 138 133* 138 139  K 4.0 4.2 4.1 3.8  CL 92* 93* 94* 94*  CO2 GLUCOSE 159* 66 112* 141*  BUN 48* 20 34* 48*  CREATININE 6.86* 3.86* 5.72* 6.34*  CALCIUM 9.1 8.5* 9.6 10.5*  PHOS  --   --  3.3 2.7   Liver Function Tests:  Recent Labs Lab 06/11/15 0722 06/13/15 0751 06/15/15 1026  AST 30  --   --   ALT 18  --   --   ALKPHOS 96  --   --   BILITOT 0.9  --   --   PROT 7.5  --   --   ALBUMIN 3.3* 3.2* 3.4*     Recent Labs Lab 06/11/15 0722  06/12/15 0701 06/13/15 0751 06/15/15 1020  WBC 11.9* 5.9 5.2 8.8  NEUTROABS 10.7*  --   --   --   HGB 12.2 12.5 12.8 13.3  HCT 37.2 38.3 39.1 41.6  MCV 93.5 92.5 92.4 94.5  PLT 112* 116* 140* 188   CBG:  Recent Labs Lab 06/14/15 0756 06/14/15 1210 06/14/15 1728 06/14/15 2010 06/15/15 0750  GLUCAP 171* 233* 213* 171* 139*   Medications:   . doxercalciferol      . doxercalciferol  1 mcg Intravenous Q M,W,F-HD  . doxercalciferol  1 mcg Intravenous Once in dialysis  . feeding supplement (ENSURE ENLIVE)   Oral QPM  . feeding supplement (NEPRO CARB STEADY)  237 mL Oral BID BM  . guaiFENesin  20 mL Oral 3 times per day  . heparin  5,000 Units Subcutaneous 3 times per day  . insulin aspart  0-9 Units Subcutaneous TID WC  . insulin glargine  10 Units Subcutaneous QHS  . lanthanum  2,000 mg Oral TID WC  .  loratadine  10 mg Oral Daily  . metoCLOPramide  5 mg Oral QID  . multivitamins with iron  1 tablet Oral Daily  . pantoprazole  40 mg Oral Q12H  . venlafaxine XR  225 mg Oral Q breakfast    Background: 55 y.o. female with ESRD secondary to DM on HD almost five years who resides at [facility] due to her multiple medical issues. 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 She also has a hx of fungal sepsis 2012, severe leukocytoclastic vasculitis, PEA arrest//VDRF/septic shock with [facility] stay 2013. She missed dialysis 2/15 due to low blood sugars. (Otherwise, attendance is good and she runs her full treatments). She does have intermittently high IDWG and chronic low blood pressures. Had EDW lowered 2.5 kg over the past month which is unusual for her. She was sent to the ED 2/16  when blood sugar failed to increase by measures at the [facility]. She had received her usual Lantus dose  evening PTA (after being hypoglycemic during the day) and found AM 2/16 with foaming at the mouth with a BS of  21. BS in the ED was 70 after 1 amp of D50- she was placed on a D10 drip CXR showed excess volume, she was requiring 4 L O2. Labs showed elevated WBC of 11.9 with 90% and platelets low at 112K K 4.   Dialysis: [facility] MWF    4h   74.5kg  2/2.25 bath  Heparin 2200   P4  AVF Hectorol 1 decreased 2/9 no Mircera or Fe - last Mircera was 50 q 2 weeks 2/1  Recent labs: hgb 13.7 19% sat ferritin 1524 iPTH 87 down from 202 Ca 9.9 P 5.3 alb 3.7 last WBC 8K plts 166 K  Hgb A1c 6.1 1/25, 8.5 10/26 and 7.7 7/27   Assessment: 1. Refractory hypoglycemia/DM - unclear etiology -  last hgb A1c 6.1 at outpt unit in January. Has lost weight. Insulin requirements may have decreased with weight loss and ESRD 2. ESRD - MWF HD 3. Hypertension/volume - under dry wt , no vol excess on exam 4. Anemia - hgb 12.8 - no ESA or Fe 5. Metabolic bone disease - continue Hectorol 1/fosrenol 6. Nutrition - continue renal carb mod diet/vit/added nepro- generally eats fairly well; prone to high IDWG 7. Thrombocytopenia - follow- normal at baseline. Seems to be improving.   Plan - for dc today after HD    Vinson Moselle MD [facility] pager [phone]    cell [phone] 06/15/2015, 3:12 PM

## 2015-06-15 NOTE — Progress Notes (Signed)
Patient arrived to unit per bed.  Reviewed treatment plan and this RN agrees.  Report received from bedside RN, Lurena Joiner.  Consent verified.  Patient A & O X 4. Lung sounds coarse, diminished to ausculation in all fields. generalized edema. Cardiac: Regular R&R.  Prepped LUAVF with alcohol and cannulated with one 15 and one 16 gauge needle.  Pulsation of blood noted.  Flushed access well with saline per protocol.  Connected and secured lines and initiated tx at 1035.  UF goal of 1200 mL and net fluid removal of 700 mL.  Will continue to monitor.

## 2015-06-15 NOTE — Progress Notes (Signed)
Report called to [place] at [facility].  All questions answered.

## 2015-06-15 NOTE — Clinical Social Work Note (Signed)
Patient will discharge today per MD order. Patient will discharge to (return to LTC) [facility] SN[facility] RN to call report prior to transportation to: [phone] Transportation: [facility]- [phone] to be called by RN after HD  CSW sent discharge summary to SNF for review.  CSW left message at home and work # for spouse to inform that patient is ready to discharge today back to LTC at [facility]  Vickii Penna, LCSW [phone]  5N1-9, 2S 15-16 and Psychiatric Service Line  Licensed Clinical Social Worker

## 2015-10-19 ENCOUNTER — Other Ambulatory Visit: Payer: Self-pay | Admitting: Nurse Practitioner

## 2015-10-19 DIAGNOSIS — N289 Disorder of kidney and ureter, unspecified: Secondary | ICD-10-CM

## 2015-10-23 ENCOUNTER — Other Ambulatory Visit: Payer: Medicare Other

## 2015-10-29 ENCOUNTER — Other Ambulatory Visit: Payer: Medicare Other

## 2016-06-13 ENCOUNTER — Encounter (HOSPITAL_COMMUNITY): Payer: Self-pay | Admitting: Emergency Medicine

## 2016-06-13 ENCOUNTER — Emergency Department (HOSPITAL_COMMUNITY): Payer: Medicare Other

## 2016-06-13 ENCOUNTER — Emergency Department (HOSPITAL_COMMUNITY)
Admission: EM | Admit: 2016-06-13 | Discharge: 2016-06-13 | Disposition: A | Discharge status: Home / Self Care | Payer: Medicare Other | Attending: Emergency Medicine | Admitting: Emergency Medicine

## 2016-06-13 DIAGNOSIS — Z794 Long term (current) use of insulin: Secondary | ICD-10-CM | POA: Insufficient documentation

## 2016-06-13 DIAGNOSIS — Y9389 Activity, other specified: Secondary | ICD-10-CM | POA: Diagnosis not present

## 2016-06-13 DIAGNOSIS — E1122 Type 2 diabetes mellitus with diabetic chronic kidney disease: Secondary | ICD-10-CM | POA: Insufficient documentation

## 2016-06-13 DIAGNOSIS — S0990XA Unspecified injury of head, initial encounter: Secondary | ICD-10-CM | POA: Diagnosis not present

## 2016-06-13 DIAGNOSIS — Z992 Dependence on renal dialysis: Secondary | ICD-10-CM | POA: Insufficient documentation

## 2016-06-13 DIAGNOSIS — W1839XA Other fall on same level, initial encounter: Secondary | ICD-10-CM | POA: Insufficient documentation

## 2016-06-13 DIAGNOSIS — Z79899 Other long term (current) drug therapy: Secondary | ICD-10-CM | POA: Insufficient documentation

## 2016-06-13 DIAGNOSIS — E1151 Type 2 diabetes mellitus with diabetic peripheral angiopathy without gangrene: Secondary | ICD-10-CM | POA: Insufficient documentation

## 2016-06-13 DIAGNOSIS — Z8673 Personal history of transient ischemic attack (TIA), and cerebral infarction without residual deficits: Secondary | ICD-10-CM | POA: Insufficient documentation

## 2016-06-13 DIAGNOSIS — Y999 Unspecified external cause status: Secondary | ICD-10-CM | POA: Insufficient documentation

## 2016-06-13 DIAGNOSIS — Y9289 Other specified places as the place of occurrence of the external cause: Secondary | ICD-10-CM | POA: Insufficient documentation

## 2016-06-13 DIAGNOSIS — S50811A Abrasion of right forearm, initial encounter: Secondary | ICD-10-CM | POA: Diagnosis not present

## 2016-06-13 DIAGNOSIS — I120 Hypertensive chronic kidney disease with stage 5 chronic kidney disease or end stage renal disease: Secondary | ICD-10-CM | POA: Insufficient documentation

## 2016-06-13 DIAGNOSIS — N186 End stage renal disease: Secondary | ICD-10-CM | POA: Insufficient documentation

## 2016-06-13 DIAGNOSIS — W19XXXA Unspecified fall, initial encounter: Secondary | ICD-10-CM

## 2016-06-13 DIAGNOSIS — S51812A Laceration without foreign body of left forearm, initial encounter: Secondary | ICD-10-CM | POA: Diagnosis not present

## 2016-06-13 DIAGNOSIS — S59912A Unspecified injury of left forearm, initial encounter: Secondary | ICD-10-CM | POA: Diagnosis present

## 2016-06-13 LAB — CBC WITH DIFFERENTIAL/PLATELET
Basophils Absolute: 0 10*3/uL (ref 0.0–0.1)
Basophils Relative: 0 %
EOS ABS: 0.1 10*3/uL (ref 0.0–0.7)
EOS PCT: 2 %
HCT: 41 % (ref 36.0–46.0)
Hemoglobin: 14.2 g/dL (ref 12.0–15.0)
LYMPHS ABS: 1.4 10*3/uL (ref 0.7–4.0)
Lymphocytes Relative: 17 %
MCH: 32.1 pg (ref 26.0–34.0)
MCHC: 34.6 g/dL (ref 30.0–36.0)
MCV: 92.8 fL (ref 78.0–100.0)
Monocytes Absolute: 0.8 10*3/uL (ref 0.1–1.0)
Monocytes Relative: 10 %
Neutro Abs: 5.9 10*3/uL (ref 1.7–7.7)
Neutrophils Relative %: 71 %
PLATELETS: 117 10*3/uL — AB (ref 150–400)
RBC: 4.42 MIL/uL (ref 3.87–5.11)
RDW: 14.2 % (ref 11.5–15.5)
WBC: 8.2 10*3/uL (ref 4.0–10.5)

## 2016-06-13 LAB — COMPREHENSIVE METABOLIC PANEL
ALT: 12 U/L — AB (ref 14–54)
AST: 27 U/L (ref 15–41)
Albumin: 3.1 g/dL — ABNORMAL LOW (ref 3.5–5.0)
Alkaline Phosphatase: 131 U/L — ABNORMAL HIGH (ref 38–126)
Anion gap: 15 (ref 5–15)
BUN: 21 mg/dL — ABNORMAL HIGH (ref 6–20)
CHLORIDE: 92 mmol/L — AB (ref 101–111)
CO2: 28 mmol/L (ref 22–32)
CREATININE: 5.15 mg/dL — AB (ref 0.44–1.00)
Calcium: 9.5 mg/dL (ref 8.9–10.3)
GFR calc non Af Amer: 9 mL/min — ABNORMAL LOW (ref >60)
GFR, EST AFRICAN AMERICAN: 10 mL/min — AB (ref >60)
Glucose, Bld: 57 mg/dL — ABNORMAL LOW (ref 65–99)
POTASSIUM: 3.4 mmol/L — AB (ref 3.5–5.1)
SODIUM: 135 mmol/L (ref 135–145)
Total Bilirubin: 1.4 mg/dL — ABNORMAL HIGH (ref 0.3–1.2)
Total Protein: 6.6 g/dL (ref 6.5–8.1)

## 2016-06-13 LAB — CBG MONITORING, ED: GLUCOSE-CAPILLARY: 50 mg/dL — AB (ref 65–99)

## 2016-06-13 LAB — PHOSPHORUS: Phosphorus: 2.2 mg/dL — ABNORMAL LOW (ref 2.5–4.6)

## 2016-06-13 LAB — MAGNESIUM: MAGNESIUM: 2.3 mg/dL (ref 1.7–2.4)

## 2016-06-13 MED ORDER — ACETAMINOPHEN 325 MG PO TABS
650.0000 mg | ORAL_TABLET | Freq: Once | ORAL | Status: AC
  Administered 2016-06-13: 650 mg via ORAL
  Filled 2016-06-13: qty 2

## 2016-06-13 NOTE — Care Management (Signed)
[facility]  [phone] to assist with next HD trreatment.  Patient HD schedule is M/W/F patient did not receive HD today. Spoke with HD CM who will contact patient's OP HD center in the am to confirm a seat for HD tomorrow. Patient is aware and verbalized understanding teach back done. CM provided  HD number to contact them in the am, patient confirms she will have transportation to HD. EDP updated on care transition plan. No further ED CM needs identified.

## 2016-06-13 NOTE — ED Notes (Signed)
ED Provider at bedside. 

## 2016-06-13 NOTE — ED Notes (Signed)
Patient leaving with [facility] at this time.

## 2016-06-13 NOTE — Discharge Instructions (Signed)
°  Please return to the emergency department if you experience any new concerning symptoms, including confusion, visual disturbances, shortness of breath, chest pain, bad headache, nausea, vomiting, dizziness or any other concerning symptoms.  Case management has arranged for you to have dialysis tomorrow.

## 2016-06-13 NOTE — ED Notes (Signed)
Two skin tears on right forearm measuring approx 2cm x 1cm cleaned with saline, dressed with telfa, gauze, and kerlix. One skin tear to left forearm approx 3cm x 1 cm cleaned with saline, dressed with telfa, gauze, and kerlix. Old skin tear to left arm dressed with telfa, gauze, and kerlix.

## 2016-06-13 NOTE — ED Notes (Signed)
CT is aware of orders. They will come for PT when scheduling allows. PT aware of delay

## 2016-06-13 NOTE — ED Notes (Signed)
[facility] contacted for tx back to [facility]

## 2016-06-13 NOTE — ED Notes (Signed)
PT remains alert and oriented x4. PT has no requests at this time.

## 2016-06-13 NOTE — ED Notes (Signed)
Patient transported to CT 

## 2016-06-13 NOTE — ED Notes (Signed)
Patient A&O x 4, states she feels dizzy while lying down. Checked CBG - 50, notified MD and provided pt with graham crackers and orange juice.

## 2016-06-13 NOTE — ED Provider Notes (Signed)
[facility]-EMERGENCY DEPT Provider Note   CSN: [id number] Arrival date & time: 06/13/16  1106     History   Chief Complaint Chief Complaint  Patient presents with  . Fall    HPI [name] is a 56 y.o. female presenting via EMS status post fall while being noted into a van in wheelchair. Patient was on her way to dialysis and slipped out of the chair and fell backwards and hit her head on the foot rest of the wheelchair. She reports pain to the back of her head. She denies any pain elsewhere on my assessment. He states that her back no longer hurts but she does report tenderness to the back of her head and neck.   HPI  Past Medical History:  Diagnosis Date  . Anemia   . Blood transfusion   . Chronic back pain   . Dementia   . Diabetes mellitus   . Hemodialysis patient (HCC) 03/15/11   "Tues; Thurs; Sat; [facility]"  . Hypertension   . PEA (Pulseless electrical activity) (HCC)   . Peripheral vascular disease (HCC)   . Rash 03/15/11   "admitted me to find out what its' from; UE, waist, thighs, groin"  . Renal failure    hd 4/12  . Stroke [facility])     Patient Active Problem List   Diagnosis Date Noted  . Hypoglycemia due to type 2 diabetes mellitus (HCC) 06/11/2015  . Pulmonary edema 06/11/2015  . S/P AKA (above knee amputation) unilateral (HCC) 05/19/2015  . 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 Depression 03/13/2015  . Prolonged Q-T interval on ECG 03/13/2015  . Type 2 diabetes mellitus, controlled, with renal complications (HCC) 12/12/2014  . Unspecified local infection of skin and subcutaneous tissue   . Chronic anemia 12/10/2014  . Ulcer of lower limb, unspecified 01/22/2013  . Mechanical complication of other vascular device, implant, and graft 05/01/2012  . Seizure-like activity (HCC) 03/19/2012  . Thrombocytopenia (HCC) 01/24/2012  . Acute respiratory failure with hypoxia (HCC) 01/23/2012  . HTN  (hypertension) 03/01/2011  . ESRD (end stage renal disease) on dialysis (HCC) 03/01/2011  . Obesities, morbid (HCC) 03/01/2011  . Osteomyelitis (HCC) 03/01/2011  . Secondary hyperparathyroidism (of renal origin) 03/01/2011  . Complication of vascular access for dialysis 03/01/2011    Past Surgical History:  Procedure Laterality Date  . AMPUTATION  03/04/2011   Procedure: AMPUTATION DIGIT;  Surgeon: [name], MD;  Location: [facility] OR;  Service: Orthopedics;  Laterality: Right;  RT GREAT TOE AMP  . AMPUTATION Right 03/13/2015   Procedure: RIGHT  ABOVE KNEE AMPUTATION;  Surgeon: Pryor Ochoa, MD;  Location: [facility] OR;  Service: Vascular;  Laterality: Right;  . ARTERIOVENOUS GRAFT PLACEMENT  10/01/10   left upper arm  . BELOW KNEE LEG AMPUTATION  2004   left  . CARPAL TUNNEL RELEASE  ~ 2009   left wrist  . CATARACT EXTRACTION W/ INTRAOCULAR LENS IMPLANT  12/2009   right eye  . HAND AMPUTATION Left   . INSERTION OF DIALYSIS CATHETER  07/29/10   right subclavian  . TEE WITHOUT CARDIOVERSION  03/18/2011   Procedure: TRANSESOPHAGEAL ECHOCARDIOGRAM (TEE);  Surgeon: [name], MD;  Location: [facility] ENDOSCOPY;  Service: Cardiovascular;  Laterality: N/A;    OB History    No data available       Home Medications    Prior to Admission medications   Medication Sig Start Date End Date Taking? Authorizing Provider  antiseptic oral rinse (BIOTENE) LIQD 15 mLs by Mouth Rinse route as needed for dry mouth.   Yes Historical Provider, MD  lanthanum (FOSRENOL) 1000 MG chewable tablet Chew 2 tablets (2,000 mg total) by mouth 3 (three) times daily with meals. 03/16/15  Yes Hollice Espy, MD  loratadine (CLARITIN) 10 MG tablet Take 10 mg by mouth daily.   Yes Historical Provider, MD  LORazepam (ATIVAN) 2 MG/ML concentrated solution Take 1 mg by mouth once.   Yes Historical Provider, MD  meclizine (ANTIVERT) 25 MG tablet Take 25 mg by mouth 3 (three) times daily as needed for dizziness.   Yes Historical  Provider, MD  Melatonin 3 MG TABS Take 3 mg by mouth at bedtime.   Yes Historical Provider, MD  metoprolol tartrate (LOPRESSOR) 25 MG tablet Take 12.5 mg by mouth See admin instructions. 12.5mg  twice daily on Sunday, Tuesday, Thursday, Saturday   Yes Historical Provider, MD  Multiple Vitamins-Iron (MULTIVITAMINS WITH IRON) TABS tablet Take 1 tablet by mouth daily.   Yes Historical Provider, MD  nystatin ointment (MYCOSTATIN) Apply 1 application topically 2 (two) times daily.   Yes Historical Provider, MD  pantoprazole (PROTONIX) 40 MG tablet Take 40 mg by mouth every 12 (twelve) hours.    Yes Historical Provider, MD  promethazine (PHENERGAN) 12.5 MG tablet Take 12.5 mg by mouth every 6 (six) hours as needed for nausea or vomiting.   Yes Historical Provider, MD  simethicone (MYLICON) 80 MG chewable tablet Chew 160 mg by mouth 2 (two) times daily.   Yes Historical Provider, MD  venlafaxine XR (EFFEXOR-XR) 75 MG 24 hr capsule Take 225 mg by mouth daily with breakfast.   Yes Historical Provider, MD  glucagon (GLUCAGON EMERGENCY) 1 MG injection Inject 1 mg into the vein once as needed (type 2 diabetes).    Historical Provider, MD  guaiFENesin (ROBITUSSIN) 100 MG/5ML SOLN Take 20 mLs by mouth every 8 (eight) hours.    Historical Provider, MD  guaiFENesin-codeine (ROBITUSSIN AC) 100-10 MG/5ML syrup Take 30 mLs by mouth every 6 (six) hours as needed for cough.    Historical Provider, MD  HYDROcodone-acetaminophen (NORCO/VICODIN) 5-325 MG tablet Take 1-2 tablets by mouth every 6 (six) hours as needed for moderate pain. 06/15/15   Clydia Llano, MD  insulin detemir (LEVEMIR) 100 UNIT/ML injection Inject 0.1 mLs (10 Units total) into the skin at bedtime. 06/15/15   Clydia Llano, MD  insulin lispro (HUMALOG KWIKPEN) 100 UNIT/ML KiwkPen Inject 5 Units into the skin 2 (two) times daily.    Historical Provider, MD  Melatonin 5 MG TABS Take 5 mg by mouth at bedtime.    Historical Provider, MD  metoCLOPramide (REGLAN) 5  MG tablet Take 5 mg by mouth 4 (four) times daily.    Historical Provider, MD  Nutritional Supplements (ENSURE CLEAR PO) Take 1 Can by mouth every evening.    Historical Provider, MD  traMADol (ULTRAM) 50 MG tablet Take 1 tablet (50 mg total) by mouth every 12 (twelve) hours as needed for moderate pain. 06/15/15   Clydia Llano, MD    Family History Family History  Problem Relation Age of Onset  . Diabetes Mellitus II Mother   . Stroke Mother     Social History Social History  Substance Use Topics  . Smoking status: Never Smoker  . Smokeless tobacco: Never Used  . Alcohol use No     Allergies   Codeine; Levofloxacin; and Penicillins   Review of Systems Review of Systems  Constitutional:  Negative for chills and fever.  HENT: Negative for ear pain and sore throat.   Eyes: Negative for pain and visual disturbance.  Respiratory: Negative for cough, chest tightness, shortness of breath and wheezing.   Cardiovascular: Negative for chest pain and palpitations.  Gastrointestinal: Negative for abdominal distention, abdominal pain, nausea and vomiting.  Genitourinary: Negative for dysuria and hematuria.  Musculoskeletal: Positive for neck pain. Negative for arthralgias, back pain and neck stiffness.  Skin: Negative for color change and rash.  Neurological: Negative for seizures, syncope, facial asymmetry, speech difficulty, weakness, light-headedness and numbness.  All other systems reviewed and are negative.    Physical Exam Updated Vital Signs BP 135/70 (BP Location: Right Arm)   Pulse 87   Temp 97.6 F (36.4 C) (Oral)   Resp 16   SpO2 98%   Physical Exam  Constitutional: She appears well-developed and well-nourished. No distress.  Patient is afebrile, nontoxic-appearing, lying comfortably in bed in no acute distress.  HENT:  Head: Normocephalic and atraumatic.  Mouth/Throat: Oropharynx is clear and moist.  Eyes: Conjunctivae and EOM are normal. Pupils are equal, round,  and reactive to light. Right eye exhibits no discharge. Left eye exhibits no discharge.  No nystagmus  Neck: Neck supple.  Cardiovascular: Normal rate, regular rhythm, normal heart sounds and intact distal pulses.   No murmur heard. Strong femoral pulses bilaterally  Pulmonary/Chest: Effort normal and breath sounds normal. No respiratory distress. She has no wheezes. She has no rales.  Abdominal: Soft. Bowel sounds are normal. She exhibits no distension. There is no tenderness. There is no guarding.  Musculoskeletal: She exhibits tenderness. She exhibits no edema or deformity.  Patient is bilaterally amputated lower extremities. Left amputation to the left upper extremity below the elbow. Shunt in left arm  Patient has cervical midline tenderness no thoracic lumbar sacral midline tenderness.   Neurological: She is alert. No cranial nerve deficit or sensory deficit. Coordination normal.  Neurologic Exam:   - Mental status: Patient is alert and cooperative. Fluent speech and words are clear. Coherent thought processes and insight is good. Patient is oriented x 4 to person, place, time and event.  - Cranial nerves:  CN III, IV, VI: pupils equally round, reactive to light both direct and conscensual and normal accommodation. Full extra-ocular movement. CN VII : muscles of facial expression intact. CN X :  midline uvula. XI strength of sternocleidomastoid and trapezius muscles 5/5, XII: tongue is midline when protruded.  - Motor: No involuntary movements. Muscle tone and bulk normal throughout. Muscle strength is 5/5 in bilateral shoulder abduction, elbow flexion and extension, left strong grip, hip flexion bilaterally. - Sensory: Proprioception, light tough sensation intact in all extremities.  - Cerebellar: rapid alternating movements and point to point movement intact in upper extremities.    Skin: Skin is warm and dry. Rash noted. She is not diaphoretic. No erythema. No pallor.  Patient has  abrasion to right forearm. Skin tears to left forearm with proximal being from prior and distal from this fall. Did not visualize laceration to the back of her head.  Psychiatric: She has a normal mood and affect.  Nursing note and vitals reviewed.    ED Treatments / Results  Labs (all labs ordered are listed, but only abnormal results are displayed) Labs Reviewed  CBC WITH DIFFERENTIAL/PLATELET - Abnormal; Notable for the following:       Result Value   Platelets 117 (*)    All other components within normal limits  COMPREHENSIVE  METABOLIC PANEL - Abnormal; Notable for the following:    Potassium 3.4 (*)    Chloride 92 (*)    Glucose, Bld 57 (*)    BUN 21 (*)    Creatinine, Ser 5.15 (*)    Albumin 3.1 (*)    ALT 12 (*)    Alkaline Phosphatase 131 (*)    Total Bilirubin 1.4 (*)    GFR calc non Af Amer 9 (*)    GFR calc Af Amer 10 (*)    All other components within normal limits  PHOSPHORUS - Abnormal; Notable for the following:    Phosphorus 2.2 (*)    All other components within normal limits  CBG MONITORING, ED - Abnormal; Notable for the following:    Glucose-Capillary 50 (*)    All other components within normal limits  MAGNESIUM    EKG  EKG Interpretation None       Radiology Ct Head Wo Contrast  Result Date: 06/13/2016 CLINICAL DATA:  Falling backward on abuts. Trauma to the head and neck. EXAM: CT HEAD WITHOUT CONTRAST CT CERVICAL SPINE WITHOUT CONTRAST TECHNIQUE: Multidetector CT imaging of the head and cervical spine was performed following the standard protocol without intravenous contrast. Multiplanar CT image reconstructions of the cervical spine were also generated. COMPARISON:  03/19/2012 FINDINGS: CT HEAD FINDINGS Brain: Generalized brain atrophy, markedly advanced for age. Extensive chronic small-vessel ischemic changes throughout the brain. No sign of acute infarction, mass lesion, hemorrhage, hydrocephalus or extra-axial collection. Vascular: There is  atherosclerotic calcification of the major vessels at the base of the brain. Skull: No traumatic finding. Sinuses/Orbits: Clear/normal Other: Vertex scalp hematoma CT CERVICAL SPINE FINDINGS Alignment: Normal Skull base and vertebrae: Negative for fracture Soft tissues and spinal canal: No evidence of acute soft tissue injury. Disc levels: Degenerative spondylosis most pronounced at C5-6 and C6-7. Upper chest: Not included Other: None significant except for vascular calcification. IMPRESSION: Vertex scalp hematoma. No skull fracture. No traumatic intracranial finding. Advanced atrophy and chronic small-vessel ischemic changes, markedly pronounced for age. Extensive vascular calcification. Cervical spondylosis C5-6 and C6-7 without traumatic finding. Electronically Signed   By: Paulina Fusi M.D.   On: 06/13/2016 14:23   Ct Cervical Spine Wo Contrast  Result Date: 06/13/2016 CLINICAL DATA:  Falling backward on abuts. Trauma to the head and neck. EXAM: CT HEAD WITHOUT CONTRAST CT CERVICAL SPINE WITHOUT CONTRAST TECHNIQUE: Multidetector CT imaging of the head and cervical spine was performed following the standard protocol without intravenous contrast. Multiplanar CT image reconstructions of the cervical spine were also generated. COMPARISON:  03/19/2012 FINDINGS: CT HEAD FINDINGS Brain: Generalized brain atrophy, markedly advanced for age. Extensive chronic small-vessel ischemic changes throughout the brain. No sign of acute infarction, mass lesion, hemorrhage, hydrocephalus or extra-axial collection. Vascular: There is atherosclerotic calcification of the major vessels at the base of the brain. Skull: No traumatic finding. Sinuses/Orbits: Clear/normal Other: Vertex scalp hematoma CT CERVICAL SPINE FINDINGS Alignment: Normal Skull base and vertebrae: Negative for fracture Soft tissues and spinal canal: No evidence of acute soft tissue injury. Disc levels: Degenerative spondylosis most pronounced at C5-6 and C6-7.  Upper chest: Not included Other: None significant except for vascular calcification. IMPRESSION: Vertex scalp hematoma. No skull fracture. No traumatic intracranial finding. Advanced atrophy and chronic small-vessel ischemic changes, markedly pronounced for age. Extensive vascular calcification. Cervical spondylosis C5-6 and C6-7 without traumatic finding. Electronically Signed   By: Paulina Fusi M.D.   On: 06/13/2016 14:23    Procedures Procedures (including critical care  time)  Medications Ordered in ED Medications  acetaminophen (TYLENOL) tablet 650 mg (650 mg Oral Given 06/13/16 1740)     Initial Impression / Assessment and Plan / ED Course  I have reviewed the triage vital signs and the nursing notes.  Pertinent labs & imaging results that were available during my care of the patient were reviewed by me and considered in my medical decision making (see chart for details).     56 y/o female presenting via EMS s/p fall while transferred to [facility] for transport to dialysis.  Patient missed dialysis today. Reassuring exam without gross neuro deficits. Patient is well-appearing and in no acute distress.  CT head with chronic significant atrophy, no acute traumatic injury CT cervical spine with chronicspondylosis, but no acute traumatic findings.   She had an episode of hypoglycemia at 52 and was given orange juice. Pain was managed while in the ed.  Consulted case management regarding dialysis scheduling and I spoke with her. She will be coordinating the next appointment with the center.  Case management stated that she will be going to dialysis tomorrow and it will be arranged with the facility.  Discharge home with PCP follow up. Patient reported feeling much better and ready to go home.  Discussed strict return precautions. Patient was advised to return to the emergency department if experiencing any new or worsening symptoms. Patient clearly understood instructions and agreed with  discharge plan.  Patient was discussed with Dr. Fayrene Fearing who agrees with assessment and plan.  Final Clinical Impressions(s) / ED Diagnoses   Final diagnoses:  Fall, initial encounter    New Prescriptions New Prescriptions   No medications on file     Gregary Cromer 06/13/16 1857    Rolland Porter, MD 06/15/16 [phone]

## 2016-06-13 NOTE — ED Triage Notes (Signed)
PT was in a wheelchair and was being loaded into a tranport van to go to dialysis. PT's wheelchair fell backward and PT struck her head on the foot piece of a wheelchair that was behind her. PT reports pain in the back of her head and right shoulder. PT denies LOC. PT did not dialyze today. PT has a laceration to back of head and skin tears on both arms. 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 PT alert and oriented x4

## 2016-06-13 NOTE — ED Notes (Signed)
Care handoff to Brooke RN 

## 2016-06-13 NOTE — ED Notes (Signed)
Provided patient with ginger ale

## 2016-08-01 ENCOUNTER — Other Ambulatory Visit (HOSPITAL_COMMUNITY): Payer: Self-pay | Admitting: Internal Medicine

## 2016-08-01 ENCOUNTER — Other Ambulatory Visit: Payer: Self-pay | Admitting: Internal Medicine

## 2016-08-01 DIAGNOSIS — R630 Anorexia: Secondary | ICD-10-CM

## 2016-08-09 ENCOUNTER — Encounter (HOSPITAL_COMMUNITY): Payer: Medicare Other

## 2016-08-09 ENCOUNTER — Ambulatory Visit (HOSPITAL_COMMUNITY)
Admission: RE | Admit: 2016-08-09 | Discharge: 2016-08-09 | Disposition: A | Discharge status: Home / Self Care | Payer: Medicare Other | Source: Ambulatory Visit | Attending: Internal Medicine | Admitting: Internal Medicine

## 2016-08-09 DIAGNOSIS — N329 Bladder disorder, unspecified: Secondary | ICD-10-CM | POA: Diagnosis not present

## 2016-08-09 DIAGNOSIS — R188 Other ascites: Secondary | ICD-10-CM | POA: Insufficient documentation

## 2016-08-09 DIAGNOSIS — Z9049 Acquired absence of other specified parts of digestive tract: Secondary | ICD-10-CM | POA: Insufficient documentation

## 2016-08-09 DIAGNOSIS — N271 Small kidney, bilateral: Secondary | ICD-10-CM | POA: Insufficient documentation

## 2016-08-09 DIAGNOSIS — N289 Disorder of kidney and ureter, unspecified: Secondary | ICD-10-CM | POA: Insufficient documentation

## 2016-08-09 DIAGNOSIS — I700 Atherosclerosis of aorta: Secondary | ICD-10-CM | POA: Insufficient documentation

## 2016-08-09 DIAGNOSIS — R630 Anorexia: Secondary | ICD-10-CM | POA: Diagnosis present

## 2016-08-09 DIAGNOSIS — M5144 Schmorl's nodes, thoracic region: Secondary | ICD-10-CM | POA: Diagnosis not present

## 2016-08-09 DIAGNOSIS — R195 Other fecal abnormalities: Secondary | ICD-10-CM | POA: Diagnosis not present

## 2016-08-09 DIAGNOSIS — I708 Atherosclerosis of other arteries: Secondary | ICD-10-CM | POA: Insufficient documentation

## 2016-08-09 MED ORDER — IOPAMIDOL (ISOVUE-300) INJECTION 61%
100.0000 mL | Freq: Once | INTRAVENOUS | Status: AC | PRN
  Administered 2016-08-09: 80 mL via INTRAVENOUS

## 2016-08-09 MED ORDER — IOPAMIDOL (ISOVUE-300) INJECTION 61%
INTRAVENOUS | Status: AC
  Filled 2016-08-09: qty 100

## 2016-08-11 ENCOUNTER — Other Ambulatory Visit (HOSPITAL_COMMUNITY): Payer: Self-pay | Admitting: Family Medicine

## 2016-08-11 DIAGNOSIS — R1310 Dysphagia, unspecified: Secondary | ICD-10-CM

## 2016-08-15 ENCOUNTER — Other Ambulatory Visit: Payer: Self-pay | Admitting: Radiology

## 2016-08-16 ENCOUNTER — Ambulatory Visit (HOSPITAL_COMMUNITY)
Admission: RE | Admit: 2016-08-16 | Discharge: 2016-08-16 | Disposition: A | Discharge status: Home / Self Care | Payer: Medicare Other | Source: Ambulatory Visit | Attending: Family Medicine | Admitting: Family Medicine

## 2016-08-16 ENCOUNTER — Encounter (HOSPITAL_COMMUNITY): Payer: Self-pay

## 2016-08-16 ENCOUNTER — Other Ambulatory Visit (HOSPITAL_COMMUNITY): Payer: Self-pay | Admitting: Family Medicine

## 2016-08-16 DIAGNOSIS — Z880 Allergy status to penicillin: Secondary | ICD-10-CM | POA: Insufficient documentation

## 2016-08-16 DIAGNOSIS — E46 Unspecified protein-calorie malnutrition: Secondary | ICD-10-CM | POA: Insufficient documentation

## 2016-08-16 DIAGNOSIS — Z8673 Personal history of transient ischemic attack (TIA), and cerebral infarction without residual deficits: Secondary | ICD-10-CM | POA: Insufficient documentation

## 2016-08-16 DIAGNOSIS — Z89611 Acquired absence of right leg above knee: Secondary | ICD-10-CM | POA: Insufficient documentation

## 2016-08-16 DIAGNOSIS — Z992 Dependence on renal dialysis: Secondary | ICD-10-CM | POA: Diagnosis not present

## 2016-08-16 DIAGNOSIS — Z5309 Procedure and treatment not carried out because of other contraindication: Secondary | ICD-10-CM | POA: Diagnosis not present

## 2016-08-16 DIAGNOSIS — E1151 Type 2 diabetes mellitus with diabetic peripheral angiopathy without gangrene: Secondary | ICD-10-CM | POA: Insufficient documentation

## 2016-08-16 DIAGNOSIS — D631 Anemia in chronic kidney disease: Secondary | ICD-10-CM | POA: Diagnosis not present

## 2016-08-16 DIAGNOSIS — N186 End stage renal disease: Secondary | ICD-10-CM | POA: Insufficient documentation

## 2016-08-16 DIAGNOSIS — E1122 Type 2 diabetes mellitus with diabetic chronic kidney disease: Secondary | ICD-10-CM | POA: Insufficient documentation

## 2016-08-16 DIAGNOSIS — R627 Adult failure to thrive: Secondary | ICD-10-CM | POA: Diagnosis not present

## 2016-08-16 DIAGNOSIS — M549 Dorsalgia, unspecified: Secondary | ICD-10-CM | POA: Insufficient documentation

## 2016-08-16 DIAGNOSIS — G8929 Other chronic pain: Secondary | ICD-10-CM | POA: Insufficient documentation

## 2016-08-16 DIAGNOSIS — Z6825 Body mass index (BMI) 25.0-25.9, adult: Secondary | ICD-10-CM | POA: Diagnosis not present

## 2016-08-16 DIAGNOSIS — I120 Hypertensive chronic kidney disease with stage 5 chronic kidney disease or end stage renal disease: Secondary | ICD-10-CM | POA: Diagnosis not present

## 2016-08-16 DIAGNOSIS — R1310 Dysphagia, unspecified: Secondary | ICD-10-CM

## 2016-08-16 DIAGNOSIS — Z8674 Personal history of sudden cardiac arrest: Secondary | ICD-10-CM | POA: Insufficient documentation

## 2016-08-16 DIAGNOSIS — F0390 Unspecified dementia without behavioral disturbance: Secondary | ICD-10-CM | POA: Insufficient documentation

## 2016-08-16 HISTORY — PX: IR FLUORO RM 30-60 MIN: IMG2384

## 2016-08-16 LAB — CBC
HCT: 34.1 % — ABNORMAL LOW (ref 36.0–46.0)
Hemoglobin: 11.2 g/dL — ABNORMAL LOW (ref 12.0–15.0)
MCH: 31.1 pg (ref 26.0–34.0)
MCHC: 32.8 g/dL (ref 30.0–36.0)
MCV: 94.7 fL (ref 78.0–100.0)
PLATELETS: 168 10*3/uL (ref 150–400)
RBC: 3.6 MIL/uL — ABNORMAL LOW (ref 3.87–5.11)
RDW: 16.7 % — ABNORMAL HIGH (ref 11.5–15.5)
WBC: 6.3 10*3/uL (ref 4.0–10.5)

## 2016-08-16 LAB — BASIC METABOLIC PANEL
Anion gap: 6 (ref 5–15)
BUN: 11 mg/dL (ref 6–20)
CALCIUM: 8.7 mg/dL — AB (ref 8.9–10.3)
CO2: 32 mmol/L (ref 22–32)
CREATININE: 1.8 mg/dL — AB (ref 0.44–1.00)
Chloride: 94 mmol/L — ABNORMAL LOW (ref 101–111)
GFR calc non Af Amer: 31 mL/min — ABNORMAL LOW (ref >60)
GFR, EST AFRICAN AMERICAN: 36 mL/min — AB (ref >60)
GLUCOSE: 63 mg/dL — AB (ref 65–99)
Potassium: 3.4 mmol/L — ABNORMAL LOW (ref 3.5–5.1)
Sodium: 132 mmol/L — ABNORMAL LOW (ref 135–145)

## 2016-08-16 LAB — APTT: APTT: 32 s (ref 24–36)

## 2016-08-16 LAB — PROTIME-INR
INR: 0.97
PROTHROMBIN TIME: 12.8 s (ref 11.4–15.2)

## 2016-08-16 MED ORDER — MIDAZOLAM HCL 2 MG/2ML IJ SOLN
INTRAMUSCULAR | Status: AC
  Filled 2016-08-16: qty 2

## 2016-08-16 MED ORDER — SODIUM CHLORIDE 0.9 % IV SOLN: INTRAVENOUS | Status: DC

## 2016-08-16 MED ORDER — IOPAMIDOL (ISOVUE-300) INJECTION 61%
INTRAVENOUS | Status: AC
  Filled 2016-08-16: qty 50

## 2016-08-16 MED ORDER — VANCOMYCIN HCL IN DEXTROSE 1-5 GM/200ML-% IV SOLN
1000.0000 mg | Freq: Once | INTRAVENOUS | Status: AC
  Administered 2016-08-16: 1000 mg via INTRAVENOUS

## 2016-08-16 MED ORDER — VANCOMYCIN HCL IN DEXTROSE 1-5 GM/200ML-% IV SOLN
INTRAVENOUS | Status: AC
  Filled 2016-08-16: qty 200

## 2016-08-16 MED ORDER — GLUCAGON HCL RDNA (DIAGNOSTIC) 1 MG IJ SOLR
INTRAMUSCULAR | Status: AC
  Filled 2016-08-16: qty 1

## 2016-08-16 MED ORDER — FENTANYL CITRATE (PF) 100 MCG/2ML IJ SOLN
INTRAMUSCULAR | Status: AC
  Filled 2016-08-16: qty 2

## 2016-08-16 MED ORDER — LIDOCAINE HCL 1 % IJ SOLN
INTRAMUSCULAR | Status: AC
  Filled 2016-08-16: qty 20

## 2016-08-16 NOTE — Sedation Documentation (Signed)
Pt D/C back to [facility]. Transported by [facility]. Awake and alert. In no distress.

## 2016-08-16 NOTE — Sedation Documentation (Signed)
Procedure cancelled per Dr. Loreta Ave. Dr. Loreta Ave spoke with sister in law.

## 2016-08-16 NOTE — H&P (Signed)
Chief Complaint: protein calorie malnutrition/ FTT  Referring Physician:Jordan Blattenberger  Supervising Physician: Gilmer Mor  Patient Status: [facility] - Out-pt  HPI: [name] is a 56 y.o. female with a history of dementia, PVD, ESRD, DM, who presents today for placement of a g-tube.  The patient is unsure why she is here.  She is able to answer who she is and where she is, but is unsure of the date and situation (who the president is).  The SIL is here who is her POA.  She also does not know anything really about the procedure.  Apparently the patient's spouse passed away in 2022-05-19 and now has major depressive disorder.  She is on palliative care.  She is failure to thrive and does not want to eat.  The facility has requested a g-tube to be placed for nutritional support.  Past Medical History:  Past Medical History:  Diagnosis Date  . Anemia   . Blood transfusion   . Chronic back pain   . Dementia   . Diabetes mellitus   . Hemodialysis patient (HCC) 03/15/11   "Tues; Thurs; Sat; [facility]"  . Hypertension   . PEA (Pulseless electrical activity) (HCC)   . Peripheral vascular disease (HCC)   . Rash 03/15/11   "admitted me to find out what its' from; UE, waist, thighs, groin"  . Renal failure    hd 4/12  . Stroke [facility])     Past Surgical History:  Past Surgical History:  Procedure Laterality Date  . AMPUTATION  03/04/2011   Procedure: AMPUTATION DIGIT;  Surgeon: [name], MD;  Location: [facility] OR;  Service: Orthopedics;  Laterality: Right;  RT GREAT TOE AMP  . AMPUTATION Right 03/13/2015   Procedure: RIGHT  ABOVE KNEE AMPUTATION;  Surgeon: Pryor Ochoa, MD;  Location: [facility] OR;  Service: Vascular;  Laterality: Right;  . ARTERIOVENOUS GRAFT PLACEMENT  10/01/10   left upper arm  . BELOW KNEE LEG AMPUTATION  2004   left  . CARPAL TUNNEL RELEASE  ~ 2009   left wrist  . CATARACT EXTRACTION W/ INTRAOCULAR LENS IMPLANT  12/2009   right eye  . HAND  AMPUTATION Left   . INSERTION OF DIALYSIS CATHETER  07/29/10   right subclavian  . TEE WITHOUT CARDIOVERSION  03/18/2011   Procedure: TRANSESOPHAGEAL ECHOCARDIOGRAM (TEE);  Surgeon: [name], MD;  Location: [facility] ENDOSCOPY;  Service: Cardiovascular;  Laterality: N/A;    Family History:  Family History  Problem Relation Age of Onset  . Diabetes Mellitus II Mother   . Stroke Mother     Social History:  reports that she has never smoked. She has never used smokeless tobacco. She reports that she does not drink alcohol or use drugs.  Allergies:  Allergies  Allergen Reactions  . Codeine Hives  . Levofloxacin Other (See Comments)    unknown  . Penicillins Hives    Medications: Medications reviewed at bedside.  Please HPI for pertinent positives, otherwise complete 10 system ROS negative.  Mallampati Score: MD Evaluation Airway: WNL Heart: WNL Abdomen: WNL Chest/ Lungs: WNL Other Pertinent Findings: patient is a 3 extremity amputee.  has RUE.  feeding tube in place ASA  Classification: 3 Mallampati/Airway Score: Two  Physical Exam: BP (!) 142/61   Pulse 76   Temp 97.3 F (36.3 C) (Oral)   Resp 16   Ht 5' (1.524 m)   Wt 130 lb (59 kg)   SpO2 98%   BMI  25.39 kg/m  Body mass index is 25.39 kg/m. General: pleasant, WD white female who is laying in bed in NAD HEENT: head is normocephalic, atraumatic.  Sclera are noninjected.  PERRL.  Ears and nose without any masses or lesions, PANDA in place.  Mouth is pink and dry. Heart: regular, rate, and rhythm.  Normal s1,s2. No obvious murmurs, gallops, or rubs noted.  Palpable right radial pulse Lungs: CTAB, no wheezes, rhonchi, or rales noted.  Respiratory effort nonlabored Abd: soft, mildly tender in LUQ, ND, +BS, no masses, hernias, or organomegaly MS: amputation of left BKA, right LE AKA, LUE amputation below the elbow Psych: Alert, oriented to self and place only.   Labs: Results for orders placed or performed during the  hospital encounter of 08/16/16 (from the past 48 hour(s))  APTT     Status: None   Collection Time: 08/16/16  7:45 AM  Result Value Ref Range   aPTT 32 24 - 36 seconds  CBC     Status: Abnormal   Collection Time: 08/16/16  7:45 AM  Result Value Ref Range   WBC 6.3 4.0 - 10.5 K/uL   RBC 3.60 (L) 3.87 - 5.11 MIL/uL   Hemoglobin 11.2 (L) 12.0 - 15.0 g/dL   HCT 16.1 (L) 09.6 - 04.5 %   MCV 94.7 78.0 - 100.0 fL   MCH 31.1 26.0 - 34.0 pg   MCHC 32.8 30.0 - 36.0 g/dL   RDW 40.9 (H) 81.1 - 91.4 %   Platelets 168 150 - 400 K/uL  Protime-INR     Status: None   Collection Time: 08/16/16  7:45 AM  Result Value Ref Range   Prothrombin Time 12.8 11.4 - 15.2 seconds   INR 0.97     Imaging: No results found.  Assessment/Plan 1. PCM/FTT We will plan to move forward with placement of a gastrostomy tube today for nutritional support.  We have had a long thorough discussion with the SIL about a g-tube, the reasons for a g-tube, how long it can remain in place, etc.  The SIL is very appreciative of all the information.  She agrees to placement of this tube for nutritional support for the patient. Labs and vitals reviewed. Risks and Benefits discussed with the patient including, but not limited to the need for a barium enema during the procedure, bleeding, infection, peritonitis, or damage to adjacent structures. All of the patient's questions were answered, patient is agreeable to proceed. Consent signed and in chart.  Thank you for this interesting consult.  I greatly enjoyed meeting [name] and look forward to participating in their care.  A copy of this report was sent to the requesting provider on this date.  Electronically Signed: Letha Cape 08/16/2016, 8:31 AM   I spent a total of  30 Minutes   in face to face in clinical consultation, greater than 50% of which was counseling/coordinating care for protein calorie malnutrition, failure to thrive

## 2016-08-22 ENCOUNTER — Other Ambulatory Visit (HOSPITAL_COMMUNITY): Payer: Self-pay | Admitting: Internal Medicine

## 2016-08-22 ENCOUNTER — Other Ambulatory Visit: Payer: Self-pay | Admitting: General Surgery

## 2016-08-22 ENCOUNTER — Other Ambulatory Visit: Payer: Self-pay | Admitting: Radiology

## 2016-08-22 DIAGNOSIS — R630 Anorexia: Secondary | ICD-10-CM

## 2016-08-23 ENCOUNTER — Encounter (HOSPITAL_COMMUNITY): Payer: Self-pay

## 2016-08-23 ENCOUNTER — Ambulatory Visit (HOSPITAL_COMMUNITY)
Admission: RE | Admit: 2016-08-23 | Discharge: 2016-08-23 | Disposition: A | Discharge status: Home / Self Care | Payer: Medicare Other | Source: Ambulatory Visit | Attending: Internal Medicine | Admitting: Internal Medicine

## 2016-08-23 DIAGNOSIS — E1122 Type 2 diabetes mellitus with diabetic chronic kidney disease: Secondary | ICD-10-CM | POA: Insufficient documentation

## 2016-08-23 DIAGNOSIS — M549 Dorsalgia, unspecified: Secondary | ICD-10-CM

## 2016-08-23 DIAGNOSIS — Z8673 Personal history of transient ischemic attack (TIA), and cerebral infarction without residual deficits: Secondary | ICD-10-CM

## 2016-08-23 DIAGNOSIS — N186 End stage renal disease: Secondary | ICD-10-CM | POA: Insufficient documentation

## 2016-08-23 DIAGNOSIS — Z880 Allergy status to penicillin: Secondary | ICD-10-CM | POA: Insufficient documentation

## 2016-08-23 DIAGNOSIS — F0390 Unspecified dementia without behavioral disturbance: Secondary | ICD-10-CM | POA: Insufficient documentation

## 2016-08-23 DIAGNOSIS — E11649 Type 2 diabetes mellitus with hypoglycemia without coma: Secondary | ICD-10-CM | POA: Diagnosis not present

## 2016-08-23 DIAGNOSIS — I120 Hypertensive chronic kidney disease with stage 5 chronic kidney disease or end stage renal disease: Secondary | ICD-10-CM | POA: Insufficient documentation

## 2016-08-23 DIAGNOSIS — G8929 Other chronic pain: Secondary | ICD-10-CM

## 2016-08-23 DIAGNOSIS — Z89611 Acquired absence of right leg above knee: Secondary | ICD-10-CM | POA: Insufficient documentation

## 2016-08-23 DIAGNOSIS — Z89512 Acquired absence of left leg below knee: Secondary | ICD-10-CM

## 2016-08-23 DIAGNOSIS — E1151 Type 2 diabetes mellitus with diabetic peripheral angiopathy without gangrene: Secondary | ICD-10-CM | POA: Insufficient documentation

## 2016-08-23 DIAGNOSIS — E46 Unspecified protein-calorie malnutrition: Secondary | ICD-10-CM | POA: Insufficient documentation

## 2016-08-23 DIAGNOSIS — Z992 Dependence on renal dialysis: Secondary | ICD-10-CM

## 2016-08-23 DIAGNOSIS — R1312 Dysphagia, oropharyngeal phase: Secondary | ICD-10-CM

## 2016-08-23 DIAGNOSIS — Z6822 Body mass index (BMI) 22.0-22.9, adult: Secondary | ICD-10-CM | POA: Insufficient documentation

## 2016-08-23 DIAGNOSIS — R630 Anorexia: Secondary | ICD-10-CM

## 2016-08-23 DIAGNOSIS — F329 Major depressive disorder, single episode, unspecified: Secondary | ICD-10-CM | POA: Insufficient documentation

## 2016-08-23 DIAGNOSIS — R627 Adult failure to thrive: Secondary | ICD-10-CM

## 2016-08-23 HISTORY — PX: IR GASTROSTOMY TUBE MOD SED: IMG625

## 2016-08-23 LAB — GLUCOSE, CAPILLARY
GLUCOSE-CAPILLARY: 102 mg/dL — AB (ref 65–99)
Glucose-Capillary: 27 mg/dL — CL (ref 65–99)

## 2016-08-23 LAB — PROTIME-INR
INR: 0.98
PROTHROMBIN TIME: 13 s (ref 11.4–15.2)

## 2016-08-23 LAB — CBC
HEMATOCRIT: 39.3 % (ref 36.0–46.0)
HEMOGLOBIN: 12.8 g/dL (ref 12.0–15.0)
MCH: 31.2 pg (ref 26.0–34.0)
MCHC: 32.6 g/dL (ref 30.0–36.0)
MCV: 95.9 fL (ref 78.0–100.0)
Platelets: 185 10*3/uL (ref 150–400)
RBC: 4.1 MIL/uL (ref 3.87–5.11)
RDW: 17.1 % — ABNORMAL HIGH (ref 11.5–15.5)
WBC: 7.5 10*3/uL (ref 4.0–10.5)

## 2016-08-23 LAB — APTT: APTT: 31 s (ref 24–36)

## 2016-08-23 MED ORDER — DEXTROSE 50 % IV SOLN
INTRAVENOUS | Status: AC
  Administered 2016-08-23: 25 mL
  Filled 2016-08-23: qty 50

## 2016-08-23 MED ORDER — FENTANYL CITRATE (PF) 100 MCG/2ML IJ SOLN
INTRAMUSCULAR | Status: AC | PRN
  Administered 2016-08-23 (×2): 25 ug via INTRAVENOUS

## 2016-08-23 MED ORDER — VANCOMYCIN HCL IN DEXTROSE 1-5 GM/200ML-% IV SOLN
1000.0000 mg | Freq: Once | INTRAVENOUS | Status: AC
  Administered 2016-08-23: 1000 mg via INTRAVENOUS

## 2016-08-23 MED ORDER — MIDAZOLAM HCL 2 MG/2ML IJ SOLN
INTRAMUSCULAR | Status: AC | PRN
  Administered 2016-08-23: 1 mg via INTRAVENOUS

## 2016-08-23 MED ORDER — VANCOMYCIN HCL IN DEXTROSE 1-5 GM/200ML-% IV SOLN
INTRAVENOUS | Status: AC
  Administered 2016-08-23: 1000 mg via INTRAVENOUS
  Filled 2016-08-23: qty 200

## 2016-08-23 MED ORDER — FENTANYL CITRATE (PF) 100 MCG/2ML IJ SOLN
INTRAMUSCULAR | Status: AC
  Filled 2016-08-23: qty 4

## 2016-08-23 MED ORDER — IOPAMIDOL (ISOVUE-300) INJECTION 61%
INTRAVENOUS | Status: AC
  Administered 2016-08-23: 15 mL
  Filled 2016-08-23: qty 50

## 2016-08-23 MED ORDER — BACITRACIN-NEOMYCIN-POLYMYXIN 400-5-5000 EX OINT: 1.0000 "application " | TOPICAL_OINTMENT | Freq: Every day | CUTANEOUS | Status: DC

## 2016-08-23 MED ORDER — SODIUM CHLORIDE 0.9 % IV SOLN
INTRAVENOUS | Status: DC
  Administered 2016-08-23: 10:00:00 via INTRAVENOUS

## 2016-08-23 MED ORDER — LIDOCAINE HCL 1 % IJ SOLN
INTRAMUSCULAR | Status: AC | PRN
  Administered 2016-08-23: 10 mL

## 2016-08-23 MED ORDER — LIDOCAINE HCL 1 % IJ SOLN
INTRAMUSCULAR | Status: AC
  Filled 2016-08-23: qty 20

## 2016-08-23 MED ORDER — GLUCAGON HCL RDNA (DIAGNOSTIC) 1 MG IJ SOLR
INTRAMUSCULAR | Status: AC
  Filled 2016-08-23: qty 1

## 2016-08-23 MED ORDER — DEXTROSE 50 % IV SOLN
1.0000 | Freq: Once | INTRAVENOUS | Status: AC
  Administered 2016-08-23: 50 mL via INTRAVENOUS

## 2016-08-23 MED ORDER — MIDAZOLAM HCL 2 MG/2ML IJ SOLN
INTRAMUSCULAR | Status: AC
  Filled 2016-08-23: qty 4

## 2016-08-23 NOTE — H&P (Signed)
Chief Complaint: Protein calorie malnutrition/FTT  Referring Physician:Jordan Blattenberger  Supervising Physician: Gilmer Mor  Patient Status: [name] - Out-pt  HPI: [name] is a 56 y.o. female know to IR service as we saw her last week for a g-tube placement but we were unable to proceed secondary to too much contrast in the belly.  She presents today for this procedure with no new changes to her medical history.  [name] is a 56 y.o. female with a history of dementia, PVD, ESRD, DM, who presents today for placement of a g-tube.  The patient is unsure why she is here.  She is able to answer who she is and where she is, but is unsure of the date and situation (who the president is).  The SIL is here who is her POA.  She also does not know anything really about the procedure.  Apparently the patient's spouse passed away in 2022/05/30 and now has major depressive disorder.  She is on palliative care.  She is failure to thrive and does not want to eat.  The facility has requested a g-tube to be placed for nutritional support.   Past Medical History:  Past Medical History:  Diagnosis Date  . Anemia   . Blood transfusion   . Chronic back pain   . Dementia   . Diabetes mellitus   . Hemodialysis patient (HCC) 03/15/11   "Tues; Thurs; Sat; [facility]"  . Hypertension   . PEA (Pulseless electrical activity) (HCC)   . Peripheral vascular disease (HCC)   . Rash 03/15/11   "admitted me to find out what its' from; UE, waist, thighs, groin"  . Renal failure    hd 4/12  . Stroke [facility]     Past Surgical History:  Past Surgical History:  Procedure Laterality Date  . AMPUTATION  03/04/2011   Procedure: AMPUTATION DIGIT;  Surgeon: [name], MD;  Location: [facility] OR;  Service: Orthopedics;  Laterality: Right;  RT GREAT TOE AMP  . AMPUTATION Right 03/13/2015   Procedure: RIGHT  ABOVE KNEE AMPUTATION;  Surgeon: Pryor Ochoa, MD;  Location: [facility] OR;  Service:  Vascular;  Laterality: Right;  . ARTERIOVENOUS GRAFT PLACEMENT  10/01/10   left upper arm  . BELOW KNEE LEG AMPUTATION  2004   left  . CARPAL TUNNEL RELEASE  ~ 2009   left wrist  . CATARACT EXTRACTION W/ INTRAOCULAR LENS IMPLANT  12/2009   right eye  . HAND AMPUTATION Left   . INSERTION OF DIALYSIS CATHETER  07/29/10   right subclavian  . IR FLUORO RM 30-60 MIN  08/16/2016  . TEE WITHOUT CARDIOVERSION  03/18/2011   Procedure: TRANSESOPHAGEAL ECHOCARDIOGRAM (TEE);  Surgeon: [name], MD;  Location: [facility] ENDOSCOPY;  Service: Cardiovascular;  Laterality: N/A;    Family History:  Family History  Problem Relation Age of Onset  . Diabetes Mellitus II Mother   . Stroke Mother     Social History:  reports that she has never smoked. She has never used smokeless tobacco. She reports that she does not drink alcohol or use drugs.  Allergies:  Allergies  Allergen Reactions  . Codeine Hives  . Levofloxacin Other (See Comments)    unknown  . Penicillins Hives    Medications: Medications reviewed in epic  Please HPI for pertinent positives, otherwise complete 10 system ROS negative.  Mallampati Score: MD Evaluation Airway: WNL Heart: WNL Abdomen: WNL Chest/ Lungs: WNL ASA  Classification: 3 Mallampati/Airway Score:  Two  Physical Exam: BP 104/78   Pulse 94   Temp 97.7 F (36.5 C) (Oral)   Resp 17   Wt 116 lb 10 oz (52.9 kg)   SpO2 100% Comment: 2L  BMI 22.78 kg/m  Body mass index is 22.78 kg/m. General: pleasant, WD white female who is laying in bed in NAD HEENT: head is normocephalic, atraumatic.  Sclera are noninjected.  PERRL.  Ears and nose without any masses or lesions.  Mouth is pink and moist Heart: regular, rate, and rhythm.  Normal s1,s2. No obvious murmurs, gallops, or rubs noted.  Palpable radial pulse in her right wrist Lungs: CTAB, no wheezes, rhonchi, or rales noted.  Respiratory effort nonlabored Abd: soft, NT, ND, +BS, no masses, hernias, or  organomegaly Psych: A&Ox2 but does not know year or situation, with an appropriate affect.   Labs: Results for orders placed or performed during the hospital encounter of 08/23/16 (from the past 48 hour(s))  Glucose, capillary     Status: Abnormal   Collection Time: 08/23/16  9:24 AM  Result Value Ref Range   Glucose-Capillary 27 (LL) 65 - 99 mg/dL   Comment 1 Notify RN   APTT     Status: None   Collection Time: 08/23/16  9:28 AM  Result Value Ref Range   aPTT 31 24 - 36 seconds  CBC     Status: Abnormal   Collection Time: 08/23/16  9:28 AM  Result Value Ref Range   WBC 7.5 4.0 - 10.5 K/uL   RBC 4.10 3.87 - 5.11 MIL/uL   Hemoglobin 12.8 12.0 - 15.0 g/dL   HCT 40.9 81.1 - 91.4 %   MCV 95.9 78.0 - 100.0 fL   MCH 31.2 26.0 - 34.0 pg   MCHC 32.6 30.0 - 36.0 g/dL   RDW 78.2 (H) 95.6 - 21.3 %   Platelets 185 150 - 400 K/uL  Protime-INR     Status: None   Collection Time: 08/23/16  9:28 AM  Result Value Ref Range   Prothrombin Time 13.0 11.4 - 15.2 seconds   INR 0.98   Glucose, capillary     Status: Abnormal   Collection Time: 08/23/16  9:49 AM  Result Value Ref Range   Glucose-Capillary 102 (H) 65 - 99 mg/dL    Imaging: No results found.  Assessment/Plan 1. PCM/FTT We will plan to move forward with placement of a g-tube today as long as visualization is good.  We have discussed this once again with her SIL, [name], who is her POA who is agreeable to proceed, along with the patient.  The patient does have some confusion and therefore, needs assistance with her consent form. Labs and vitals reviewed. Risks and Benefits discussed with the patient's SIL including, but not limited to the need for a barium enema during the procedure, bleeding, infection, peritonitis, or damage to adjacent structures. All of the patient's SIL's questions were answered, patient's SIL is agreeable to proceed. Consent signed and in chart.    Thank you for this interesting consult.  I greatly  enjoyed meeting [name] and look forward to participating in their care.  A copy of this report was sent to the requesting provider on this date.  Electronically Signed: Letha Cape 08/23/2016, 11:22 AM   I spent a total of    25 Minutes in face to face in clinical consultation, greater than 50% of which was counseling/coordinating care for PCM/FTT

## 2016-08-23 NOTE — Procedures (Signed)
Interventional Radiology Procedure Note  Procedure: Placement of percutaneous 20F pull-through gastrostomy tube. Complications: None Recommendations: - NPO except for sips and chips remainder of today and overnight - Maintain G-tube to LWS until tomorrow morning  - May advance diet as tolerated and begin using tube tomorrow morning  Signed,   Suad Autrey S. Floride Hutmacher, DO   

## 2016-08-23 NOTE — Progress Notes (Signed)
Pt done in IR, PTAR on way to transport back to facility. VS stable, pt sleepy from sedation. Successful Gtube was placed

## 2016-08-23 NOTE — Progress Notes (Signed)
Pt blood sugar in the 60's. 1 am of D50 given IV. Transferred back to [place] care. Spoke with RN who states they can give pt IV fluids until able to use GTube tomorrow. Will not D/C IV. Pt awake and alert. In no distress.

## 2016-08-23 NOTE — Sedation Documentation (Signed)
Patient is resting comfortably. 

## 2016-08-24 ENCOUNTER — Emergency Department (HOSPITAL_COMMUNITY): Payer: Medicare Other

## 2016-08-24 ENCOUNTER — Inpatient Hospital Stay (HOSPITAL_COMMUNITY): Payer: Medicare Other

## 2016-08-24 ENCOUNTER — Inpatient Hospital Stay (HOSPITAL_COMMUNITY)
Admission: EM | Admit: 2016-08-24 | Discharge: 2016-08-27 | DRG: 637 | Disposition: A | Discharge status: Skilled Nursing Facility | Payer: Medicare Other | Attending: Internal Medicine | Admitting: Internal Medicine

## 2016-08-24 ENCOUNTER — Encounter (HOSPITAL_COMMUNITY): Payer: Self-pay

## 2016-08-24 ENCOUNTER — Other Ambulatory Visit: Payer: Self-pay

## 2016-08-24 DIAGNOSIS — Z66 Do not resuscitate: Secondary | ICD-10-CM | POA: Diagnosis present

## 2016-08-24 DIAGNOSIS — Z8674 Personal history of sudden cardiac arrest: Secondary | ICD-10-CM

## 2016-08-24 DIAGNOSIS — E43 Unspecified severe protein-calorie malnutrition: Secondary | ICD-10-CM | POA: Diagnosis present

## 2016-08-24 DIAGNOSIS — F329 Major depressive disorder, single episode, unspecified: Secondary | ICD-10-CM | POA: Diagnosis present

## 2016-08-24 DIAGNOSIS — F0150 Vascular dementia without behavioral disturbance: Secondary | ICD-10-CM | POA: Diagnosis present

## 2016-08-24 DIAGNOSIS — R1310 Dysphagia, unspecified: Secondary | ICD-10-CM | POA: Diagnosis present

## 2016-08-24 DIAGNOSIS — R7881 Bacteremia: Secondary | ICD-10-CM | POA: Diagnosis present

## 2016-08-24 DIAGNOSIS — Z992 Dependence on renal dialysis: Secondary | ICD-10-CM | POA: Diagnosis not present

## 2016-08-24 DIAGNOSIS — E1151 Type 2 diabetes mellitus with diabetic peripheral angiopathy without gangrene: Secondary | ICD-10-CM | POA: Diagnosis present

## 2016-08-24 DIAGNOSIS — I2510 Atherosclerotic heart disease of native coronary artery without angina pectoris: Secondary | ICD-10-CM | POA: Diagnosis present

## 2016-08-24 DIAGNOSIS — Z833 Family history of diabetes mellitus: Secondary | ICD-10-CM

## 2016-08-24 DIAGNOSIS — E876 Hypokalemia: Secondary | ICD-10-CM | POA: Diagnosis present

## 2016-08-24 DIAGNOSIS — E1129 Type 2 diabetes mellitus with other diabetic kidney complication: Secondary | ICD-10-CM | POA: Diagnosis present

## 2016-08-24 DIAGNOSIS — N2581 Secondary hyperparathyroidism of renal origin: Secondary | ICD-10-CM | POA: Diagnosis present

## 2016-08-24 DIAGNOSIS — R9431 Abnormal electrocardiogram [ECG] [EKG]: Secondary | ICD-10-CM | POA: Diagnosis not present

## 2016-08-24 DIAGNOSIS — R680 Hypothermia, not associated with low environmental temperature: Secondary | ICD-10-CM | POA: Diagnosis present

## 2016-08-24 DIAGNOSIS — G9340 Encephalopathy, unspecified: Secondary | ICD-10-CM | POA: Diagnosis present

## 2016-08-24 DIAGNOSIS — Z89619 Acquired absence of unspecified leg above knee: Secondary | ICD-10-CM | POA: Diagnosis not present

## 2016-08-24 DIAGNOSIS — E871 Hypo-osmolality and hyponatremia: Secondary | ICD-10-CM | POA: Diagnosis present

## 2016-08-24 DIAGNOSIS — D638 Anemia in other chronic diseases classified elsewhere: Secondary | ICD-10-CM | POA: Diagnosis present

## 2016-08-24 DIAGNOSIS — Z89512 Acquired absence of left leg below knee: Secondary | ICD-10-CM

## 2016-08-24 DIAGNOSIS — G8929 Other chronic pain: Secondary | ICD-10-CM | POA: Diagnosis present

## 2016-08-24 DIAGNOSIS — R627 Adult failure to thrive: Secondary | ICD-10-CM | POA: Diagnosis present

## 2016-08-24 DIAGNOSIS — R748 Abnormal levels of other serum enzymes: Secondary | ICD-10-CM | POA: Diagnosis not present

## 2016-08-24 DIAGNOSIS — N186 End stage renal disease: Secondary | ICD-10-CM | POA: Diagnosis present

## 2016-08-24 DIAGNOSIS — I120 Hypertensive chronic kidney disease with stage 5 chronic kidney disease or end stage renal disease: Secondary | ICD-10-CM | POA: Diagnosis present

## 2016-08-24 DIAGNOSIS — Z961 Presence of intraocular lens: Secondary | ICD-10-CM | POA: Diagnosis present

## 2016-08-24 DIAGNOSIS — Z89611 Acquired absence of right leg above knee: Secondary | ICD-10-CM

## 2016-08-24 DIAGNOSIS — R4182 Altered mental status, unspecified: Secondary | ICD-10-CM | POA: Diagnosis not present

## 2016-08-24 DIAGNOSIS — Z681 Body mass index (BMI) 19 or less, adult: Secondary | ICD-10-CM

## 2016-08-24 DIAGNOSIS — R7989 Other specified abnormal findings of blood chemistry: Secondary | ICD-10-CM | POA: Insufficient documentation

## 2016-08-24 DIAGNOSIS — E11649 Type 2 diabetes mellitus with hypoglycemia without coma: Secondary | ICD-10-CM | POA: Diagnosis present

## 2016-08-24 DIAGNOSIS — F419 Anxiety disorder, unspecified: Secondary | ICD-10-CM | POA: Diagnosis present

## 2016-08-24 DIAGNOSIS — I248 Other forms of acute ischemic heart disease: Secondary | ICD-10-CM | POA: Diagnosis present

## 2016-08-24 DIAGNOSIS — B965 Pseudomonas (aeruginosa) (mallei) (pseudomallei) as the cause of diseases classified elsewhere: Secondary | ICD-10-CM | POA: Diagnosis present

## 2016-08-24 DIAGNOSIS — R1013 Epigastric pain: Secondary | ICD-10-CM

## 2016-08-24 DIAGNOSIS — I953 Hypotension of hemodialysis: Secondary | ICD-10-CM | POA: Diagnosis present

## 2016-08-24 DIAGNOSIS — I340 Nonrheumatic mitral (valve) insufficiency: Secondary | ICD-10-CM | POA: Diagnosis not present

## 2016-08-24 DIAGNOSIS — I69391 Dysphagia following cerebral infarction: Secondary | ICD-10-CM

## 2016-08-24 DIAGNOSIS — Z79899 Other long term (current) drug therapy: Secondary | ICD-10-CM

## 2016-08-24 DIAGNOSIS — R110 Nausea: Secondary | ICD-10-CM

## 2016-08-24 DIAGNOSIS — Z885 Allergy status to narcotic agent status: Secondary | ICD-10-CM

## 2016-08-24 DIAGNOSIS — E1122 Type 2 diabetes mellitus with diabetic chronic kidney disease: Secondary | ICD-10-CM | POA: Diagnosis present

## 2016-08-24 DIAGNOSIS — E039 Hypothyroidism, unspecified: Secondary | ICD-10-CM | POA: Diagnosis present

## 2016-08-24 DIAGNOSIS — R0602 Shortness of breath: Secondary | ICD-10-CM

## 2016-08-24 DIAGNOSIS — T68XXXA Hypothermia, initial encounter: Secondary | ICD-10-CM | POA: Insufficient documentation

## 2016-08-24 DIAGNOSIS — E8889 Other specified metabolic disorders: Secondary | ICD-10-CM | POA: Diagnosis present

## 2016-08-24 DIAGNOSIS — Z7984 Long term (current) use of oral hypoglycemic drugs: Secondary | ICD-10-CM

## 2016-08-24 DIAGNOSIS — Z880 Allergy status to penicillin: Secondary | ICD-10-CM

## 2016-08-24 DIAGNOSIS — Z881 Allergy status to other antibiotic agents status: Secondary | ICD-10-CM

## 2016-08-24 DIAGNOSIS — Z823 Family history of stroke: Secondary | ICD-10-CM

## 2016-08-24 DIAGNOSIS — R778 Other specified abnormalities of plasma proteins: Secondary | ICD-10-CM | POA: Insufficient documentation

## 2016-08-24 DIAGNOSIS — Z89111 Acquired absence of right hand: Secondary | ICD-10-CM

## 2016-08-24 LAB — COMPREHENSIVE METABOLIC PANEL
ALT: 16 U/L (ref 14–54)
AST: 43 U/L — ABNORMAL HIGH (ref 15–41)
Albumin: 2.5 g/dL — ABNORMAL LOW (ref 3.5–5.0)
Alkaline Phosphatase: 161 U/L — ABNORMAL HIGH (ref 38–126)
Anion gap: 6 (ref 5–15)
BILIRUBIN TOTAL: 1.1 mg/dL (ref 0.3–1.2)
BUN: 5 mg/dL — ABNORMAL LOW (ref 6–20)
CHLORIDE: 95 mmol/L — AB (ref 101–111)
CO2: 28 mmol/L (ref 22–32)
CREATININE: 1.73 mg/dL — AB (ref 0.44–1.00)
Calcium: 8.3 mg/dL — ABNORMAL LOW (ref 8.9–10.3)
GFR calc non Af Amer: 33 mL/min — ABNORMAL LOW (ref >60)
GFR, EST AFRICAN AMERICAN: 38 mL/min — AB (ref >60)
Glucose, Bld: 108 mg/dL — ABNORMAL HIGH (ref 65–99)
POTASSIUM: 3.3 mmol/L — AB (ref 3.5–5.1)
Sodium: 129 mmol/L — ABNORMAL LOW (ref 135–145)
TOTAL PROTEIN: 6.5 g/dL (ref 6.5–8.1)

## 2016-08-24 LAB — CBC
HEMATOCRIT: 39 % (ref 36.0–46.0)
Hemoglobin: 13.1 g/dL (ref 12.0–15.0)
MCH: 31.8 pg (ref 26.0–34.0)
MCHC: 33.6 g/dL (ref 30.0–36.0)
MCV: 94.7 fL (ref 78.0–100.0)
PLATELETS: 171 10*3/uL (ref 150–400)
RBC: 4.12 MIL/uL (ref 3.87–5.11)
RDW: 16.2 % — AB (ref 11.5–15.5)
WBC: 5.9 10*3/uL (ref 4.0–10.5)

## 2016-08-24 LAB — GLUCOSE, CAPILLARY: GLUCOSE-CAPILLARY: 61 mg/dL — AB (ref 65–99)

## 2016-08-24 LAB — TROPONIN I: Troponin I: 0.03 ng/mL (ref <0.03)

## 2016-08-24 LAB — I-STAT TROPONIN, ED
TROPONIN I, POC: 0.11 ng/mL — AB (ref 0.00–0.08)
Troponin i, poc: 0.09 ng/mL (ref 0.00–0.08)

## 2016-08-24 LAB — TSH: TSH: 10.066 u[IU]/mL — AB (ref 0.350–4.500)

## 2016-08-24 LAB — MRSA PCR SCREENING: MRSA by PCR: NEGATIVE

## 2016-08-24 LAB — I-STAT CG4 LACTIC ACID, ED: LACTIC ACID, VENOUS: 1.37 mmol/L (ref 0.5–1.9)

## 2016-08-24 LAB — CBG MONITORING, ED: GLUCOSE-CAPILLARY: 89 mg/dL (ref 65–99)

## 2016-08-24 LAB — MAGNESIUM: MAGNESIUM: 2 mg/dL (ref 1.7–2.4)

## 2016-08-24 MED ORDER — HEPARIN SODIUM (PORCINE) 5000 UNIT/ML IJ SOLN
5000.0000 [IU] | Freq: Three times a day (TID) | INTRAMUSCULAR | Status: DC
  Administered 2016-08-24 – 2016-08-27 (×8): 5000 [IU] via SUBCUTANEOUS
  Filled 2016-08-24 (×5): qty 1

## 2016-08-24 MED ORDER — ASPIRIN 81 MG PO CHEW
324.0000 mg | CHEWABLE_TABLET | Freq: Once | ORAL | Status: AC
  Administered 2016-08-24: 324 mg via ORAL
  Filled 2016-08-24: qty 4

## 2016-08-24 MED ORDER — ALBUTEROL SULFATE (2.5 MG/3ML) 0.083% IN NEBU: 2.5000 mg | INHALATION_SOLUTION | RESPIRATORY_TRACT | Status: DC | PRN

## 2016-08-24 MED ORDER — ONDANSETRON HCL 4 MG/2ML IJ SOLN: 4.0000 mg | Freq: Four times a day (QID) | INTRAMUSCULAR | Status: DC | PRN

## 2016-08-24 MED ORDER — POTASSIUM CHLORIDE CRYS ER 20 MEQ PO TBCR
20.0000 meq | EXTENDED_RELEASE_TABLET | Freq: Once | ORAL | Status: AC
  Administered 2016-08-24: 20 meq via ORAL
  Filled 2016-08-24: qty 1

## 2016-08-24 MED ORDER — DEXTROSE 5 % IV SOLN
INTRAVENOUS | Status: DC
  Administered 2016-08-24: 21:00:00 via INTRAVENOUS

## 2016-08-24 MED ORDER — VENLAFAXINE HCL ER 225 MG PO TB24: 225.0000 mg | ORAL_TABLET | Freq: Every day | ORAL | Status: DC

## 2016-08-24 MED ORDER — INSULIN ASPART 100 UNIT/ML ~~LOC~~ SOLN: 0.0000 [IU] | Freq: Four times a day (QID) | SUBCUTANEOUS | Status: DC

## 2016-08-24 MED ORDER — ATORVASTATIN CALCIUM 10 MG PO TABS
10.0000 mg | ORAL_TABLET | Freq: Every day | ORAL | Status: DC
  Administered 2016-08-25 – 2016-08-27 (×3): 10 mg
  Filled 2016-08-24 (×3): qty 1

## 2016-08-24 MED ORDER — LORAZEPAM 2 MG/ML PO CONC: 1.0000 mg | Freq: Every day | ORAL | Status: DC

## 2016-08-24 MED ORDER — SODIUM CHLORIDE 0.9 % IV BOLUS (SEPSIS)
500.0000 mL | Freq: Once | INTRAVENOUS | Status: AC
  Administered 2016-08-24: 500 mL via INTRAVENOUS

## 2016-08-24 MED ORDER — ASPIRIN 81 MG PO CHEW
81.0000 mg | CHEWABLE_TABLET | Freq: Every day | ORAL | Status: DC
  Administered 2016-08-25 – 2016-08-27 (×3): 81 mg
  Filled 2016-08-24 (×3): qty 1

## 2016-08-24 NOTE — ED Provider Notes (Signed)
[facility]-EMERGENCY DEPT Provider Note   CSN: [id number] Arrival date & time: 08/24/16  1512     History   Chief Complaint Chief Complaint  Patient presents with  . Loss of Consciousness    HPI [name] is a 56 y.o. female with dementia, DM2, ESRD, s/p multiple amputations and stroke brought in by EMS from her HD center for unresponsiveness.  History limited due to altered mental status.  Per report, about 3 hours into her HD session today she became unresponsive for several minutes and was transported to the ED.  She is now alert and interactive and endorses only a moderate frontal headache.  Does not remember what happened at HD.  She lives at [facility], where she is listed as DNR.  She underwent G-tube placement by IR yesterday for failure to thrive.  Per IR notes, her husband died in 2023/05/18 and her SIL Pati Gallo is POA.  HPI  Past Medical History:  Diagnosis Date  . Anemia   . Blood transfusion   . Chronic back pain   . Dementia   . Diabetes mellitus   . Hemodialysis patient (HCC) 03/15/11   "Tues; Thurs; Sat; [facility]"  . Hypertension   . PEA (Pulseless electrical activity) (HCC)   . Peripheral vascular disease (HCC)   . Rash 03/15/11   "admitted me to find out what its' from; UE, waist, thighs, groin"  . Renal failure    hd 4/12  . Stroke [facility])     Patient Active Problem List   Diagnosis Date Noted  . Hypoglycemia due to type 2 diabetes mellitus (HCC) 06/11/2015  . Pulmonary edema 06/11/2015  . S/P AKA (above knee amputation) unilateral (HCC) 05/19/2015  . 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 Depression 03/13/2015  . Prolonged Q-T interval on ECG 03/13/2015  . Type 2 diabetes mellitus, controlled, with renal complications (HCC) 12/12/2014  . Unspecified local infection of skin and subcutaneous tissue   . Chronic anemia 12/10/2014  . Ulcer of lower limb, unspecified 01/22/2013   . Mechanical complication of other vascular device, implant, and graft 05/01/2012  . Mental status, decreased 03/19/2012  . Seizure-like activity (HCC) 03/19/2012  . Thrombocytopenia (HCC) 01/24/2012  . Acute respiratory failure with hypoxia (HCC) 01/23/2012  . HTN (hypertension) 03/01/2011  . ESRD (end stage renal disease) on dialysis (HCC) 03/01/2011  . Obesities, morbid (HCC) 03/01/2011  . Osteomyelitis (HCC) 03/01/2011  . 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 Complication of vascular access for dialysis 03/01/2011    Past Surgical History:  Procedure Laterality Date  . AMPUTATION  03/04/2011   Procedure: AMPUTATION DIGIT;  Surgeon: [name], MD;  Location: [facility] OR;  Service: Orthopedics;  Laterality: Right;  RT GREAT TOE AMP  . AMPUTATION Right 03/13/2015   Procedure: RIGHT  ABOVE KNEE AMPUTATION;  Surgeon: Pryor Ochoa, MD;  Location: [facility] OR;  Service: Vascular;  Laterality: Right;  . ARTERIOVENOUS GRAFT PLACEMENT  10/01/10   left upper arm  . BELOW KNEE LEG AMPUTATION  2004   left  . CARPAL TUNNEL RELEASE  ~ 2009   left wrist  . CATARACT EXTRACTION W/ INTRAOCULAR LENS IMPLANT  12/2009   right eye  . HAND AMPUTATION Left   . INSERTION OF DIALYSIS CATHETER  07/29/10   right subclavian  . IR FLUORO RM 30-60 MIN  08/16/2016  . IR GASTROSTOMY TUBE MOD SED  08/23/2016  . TEE WITHOUT CARDIOVERSION  03/18/2011  Procedure: TRANSESOPHAGEAL ECHOCARDIOGRAM (TEE);  Surgeon: [name], MD;  Location: [facility] ENDOSCOPY;  Service: Cardiovascular;  Laterality: N/A;    OB History    No data available       Home Medications    Prior to Admission medications   Medication Sig Start Date End Date Taking? Authorizing Provider  antiseptic oral rinse (BIOTENE) LIQD 15 mLs by Mouth Rinse route 3 (three) times daily. After meals    Historical Provider, MD  atorvastatin (LIPITOR) 10 MG tablet Take 10 mg by mouth daily.    Historical Provider, MD  glucagon (GLUCAGON EMERGENCY)  1 MG injection Inject 1 mg into the vein once as needed (type 2 diabetes).    Historical Provider, MD  guaiFENesin (ROBITUSSIN) 100 MG/5ML SOLN Take 20 mLs by mouth every 8 (eight) hours.    Historical Provider, MD  guaiFENesin-codeine (ROBITUSSIN AC) 100-10 MG/5ML syrup Take 30 mLs by mouth every 6 (six) hours as needed for cough.    Historical Provider, MD  HYDROcodone-acetaminophen (NORCO/VICODIN) 5-325 MG tablet Take 1-2 tablets by mouth every 6 (six) hours as needed for moderate pain. 06/15/15   Clydia Llano, MD  hydrocortisone cream 1 % Apply 1 application topically every 8 (eight) hours as needed for itching (apply to back as needed).    Historical Provider, MD  LORazepam (ATIVAN) 2 MG/ML concentrated solution Take 1 mg by mouth once. ONLY BEFORE DENTAL PROCEDURES    Historical Provider, MD  meclizine (ANTIVERT) 25 MG tablet Take 25 mg by mouth 3 (three) times daily as needed for dizziness.    Historical Provider, MD  mirtazapine (REMERON) 15 MG tablet Take 15 mg by mouth at bedtime.    Historical Provider, MD  nystatin ointment (MYCOSTATIN) Apply 1 application topically 3 (three) times daily after meals. Apply to corners of mouth topically after meals for dry mouth    Historical Provider, MD  ondansetron (ZOFRAN) 4 MG tablet Take 4 mg by mouth every 8 (eight) hours as needed for nausea or vomiting.     Historical Provider, MD  pantoprazole (PROTONIX) 40 MG tablet Take 40 mg by mouth daily.     Historical Provider, MD  promethazine (PHENERGAN) 12.5 MG tablet Take 12.5-25 mg by mouth every 6 (six) hours as needed for nausea or vomiting.     Historical Provider, MD  senna-docusate (SENOKOT-S) 8.6-50 MG tablet Take 2 tablets by mouth daily.    Historical Provider, MD  traMADol (ULTRAM) 50 MG tablet Take 1 tablet (50 mg total) by mouth every 12 (twelve) hours as needed for moderate pain. 06/15/15   Clydia Llano, MD  Venlafaxine HCl 225 MG TB24 Take 225 mg by mouth daily with breakfast.    Historical  Provider, MD    Family History Family History  Problem Relation Age of Onset  . Diabetes Mellitus II Mother   . Stroke Mother     Social History Social History  Substance Use Topics  . Smoking status: Never Smoker  . Smokeless tobacco: Never Used  . Alcohol use No     Allergies   Codeine; Levofloxacin; and Penicillins   Review of Systems Review of Systems  Unable to obtain due to dementia  Physical Exam Updated Vital Signs BP (!) 135/59   Pulse 85   Temp 97.4 F (36.3 C) (Oral)   Resp 15   Ht 5' (1.524 m)   Wt 52.2 kg   SpO2 100%   BMI 22.46 kg/m   Physical Exam  Constitutional:  Chronically ill appearing  woman with multiple amputations lying in stretcher in no acute distress  HENT:  Head: Normocephalic and atraumatic.  Mouth/Throat: Oropharynx is clear and moist.  Cardiovascular: Normal rate and regular rhythm.   Pulmonary/Chest: Effort normal. No respiratory distress.  Anterior and lateral lung exam unremarkable  Abdominal:  G tube site with no drainage, bleeding, surrounding erythema  Musculoskeletal:  L BKA R AKA R UE amputation at wrist  Neurological: No cranial nerve deficit. She exhibits normal muscle tone. Coordination normal.  Alert Oriented to person and place, not situation or time Strength grossly intact throughout all extremities  Skin: Skin is warm and dry.  Psychiatric:  Affect flat Speech impoverished, answering with few words     ED Treatments / Results  Labs (all labs ordered are listed, but only abnormal results are displayed) Labs Reviewed  CBC - Abnormal; Notable for the following:       Result Value   RDW 16.2 (*)    All other components within normal limits  COMPREHENSIVE METABOLIC PANEL - Abnormal; Notable for the following:    Sodium 129 (*)    Potassium 3.3 (*)    Chloride 95 (*)    Glucose, Bld 108 (*)    BUN 5 (*)    Creatinine, Ser 1.73 (*)    Calcium 8.3 (*)    Albumin 2.5 (*)    AST 43 (*)    Alkaline  Phosphatase 161 (*)    GFR calc non Af Amer 33 (*)    GFR calc Af Amer 38 (*)    All other components within normal limits  I-STAT TROPOININ, ED - Abnormal; Notable for the following:    Troponin i, poc 0.11 (*)    All other components within normal limits  CULTURE, BLOOD (ROUTINE X 2)  CULTURE, BLOOD (ROUTINE X 2)  MAGNESIUM  URINALYSIS, ROUTINE W REFLEX MICROSCOPIC  TSH  CBG MONITORING, ED  I-STAT CG4 LACTIC ACID, ED  I-STAT TROPOININ, ED    EKG  EKG Interpretation  Date/Time:  Wednesday Aug 24 2016 15:28:08 EDT Ventricular Rate:  83 PR Interval:    QRS Duration: 106 QT Interval:  480 QTC Calculation: 565 R Axis:   113 Text Interpretation:  Sinus rhythm Right axis deviation Abnormal T, consider ischemia, anterior leads Prolonged QT interval Since last EKG, there are new, deep ST depressions in V2-V3 Otherwise no significant change Confirmed by Erma Heritage MD, Sheria Lang [phone]) on 08/24/2016 3:35:55 PM Also confirmed by Erma Heritage MD, CAMERON [phone]), editor Rollen Sox [phone])  on 08/24/2016 3:42:37 PM       Radiology Dg Chest 2 View  Result Date: 08/24/2016 CLINICAL DATA:  Possible sepsis. EXAM: CHEST  2 VIEW COMPARISON:  Radiographs of June 11, 2015. FINDINGS: The heart size and mediastinal contours are within normal limits. Both lungs are clear. No pneumothorax or pleural effusion is noted. Atherosclerosis of thoracic aorta is noted. The visualized skeletal structures are unremarkable. IMPRESSION: No active cardiopulmonary disease.  Aortic atherosclerosis. Electronically Signed   By: Lupita Raider, M.D.   On: 08/24/2016 17:24   Ir Gastrostomy Tube Mod Sed  Result Date: 08/23/2016 INDICATION: 56 year old female with a history of dysphagia EXAM: IMAGE GUIDED GASTROSTOMY TUBE MEDICATIONS: 1.0 g vancomycin; Antibiotics were administered within 1 hour of the procedure. ANESTHESIA/SEDATION: Versed 1.0 mg IV; Fentanyl 50 mcg IV Moderate Sedation Time:  10 minutes The patient was  continuously monitored during the procedure by the interventional radiology nurse under my direct supervision. CONTRAST:  10 cc - administered  into the gastric lumen. FLUOROSCOPY TIME:  Fluoroscopy Time: 1 minutes 24 seconds (13 mGy). COMPLICATIONS: None PROCEDURE: Informed written consent was obtained from the patient's family after a thorough discussion of the procedural risks, benefits and alternatives. All questions were addressed. Maximal Sterile Barrier Technique was utilized including caps, mask, sterile gowns, sterile gloves, sterile drape, hand hygiene and skin antiseptic. A timeout was performed prior to the initiation of the procedure. The procedure, risks, benefits, and alternatives were explained to the patient. Questions regarding the procedure were encouraged and answered. The patient understands and consents to the procedure. The epigastrium was prepped with Betadine in a sterile fashion, and a sterile drape was applied covering the operative field. A sterile gown and sterile gloves were used for the procedure. A 5-French orogastric tube is placed under fluoroscopic guidance. Scout imaging of the abdomen confirms barium within the transverse colon. The stomach was distended with gas. Under fluoroscopic guidance, an 18 gauge needle was utilized to puncture the anterior wall of the body of the stomach. An Amplatz wire was advanced through the needle passing a T fastener into the lumen of the stomach. The T fastener was secured for gastropexy. A 9-French sheath was inserted. A snare was advanced through the 9-French sheath. A Teena Dunk was advanced through the orogastric tube. It was snared then pulled out the oral cavity, pulling the snare, as well. The leading edge of the gastrostomy was attached to the snare. It was then pulled down the esophagus and out the percutaneous site. It was secured in place. Contrast was injected. No complication IMPRESSION: Status post percutaneous gastrostomy tube placement.  Signed, Yvone Neu. Loreta Ave, DO [facility] Radiology Electronically Signed   By: Gilmer Mor D.O.   On: 08/23/2016 12:07   Procedures Procedures (including critical care time)  Medications Ordered in ED Medications  sodium chloride 0.9 % bolus 500 mL (500 mLs Intravenous New Bag/Given 08/24/16 1753)  aspirin chewable tablet 324 mg (324 mg Oral Given 08/24/16 1645)     Initial Impression / Assessment and Plan / ED Course  I have reviewed the triage vital signs and the nursing notes.  Pertinent labs & imaging results that were available during my care of the patient were reviewed by me and considered in my medical decision making (see chart for details).  She is now alert and responsive, back to her likely baseline, hemodynamically stable.  Uncertain etiology of her reported event, broad differential.  New anterior TWI concerning for ACS.  Not hypoglycemic, neuro exam nonfocal, doubt stroke or seizure.  Clinical Course as of Aug 24 1816  Wed Aug 24, 2016  1549 EKG with new deep anterior TWI.  Denies current chest pain and SOB.  Obtain posterior EKG.  [MO]  1621 Posterior EKG without STEMI.  Troponin mildly elevated, uncertain significance in ESRD patient, currently asymptomatic.  Will check head CT to rule-out intracranial bleed and consider heparinization.  [MO]  1644 Discussed patient with nurse at [facility] center.  Reports that at baseline she is alert and oriented, but speaks only in a few words.  Her husband died several months ago, SIL Pati Gallo [phone]) is emergency contact/HCPOA.  She is DNR at the facility.  [MO]  1647 Hypothermic 94.7 oral.  BAIR hugger applied.  Came from facility, exposure unlikely.  No leukocytosis, but concern for sepsis with hypothermia.  Will check lactic acid, CXR, draw BCx. qSOFA 0 or 1 (mental status?), SIRS 1/4.  [MO]  1745 Consult for admission, discussed with hospitalist Dr Thedore Mins  [MO]  [phone]  [facility] cardiology per hospitalist request.  Discussed with Dr Clifton James  [MO]    Clinical Course User Index [MO] Alm Bustard, MD      Final Clinical Impressions(s) / ED Diagnoses   Final diagnoses:  Hypothermia, initial encounter  T wave inversion in EKG   Hypothermia, borderline hypotension, and EKG changes, concern for sepsis vs hemodynamics shift in HD causing syncope and demand ischemia.  Admit to medicine for further workup and evaluation.  New Prescriptions New Prescriptions   No medications on file     Alm Bustard, MD 08/24/16 1819    Shaune Pollack, MD 08/26/16 1218

## 2016-08-24 NOTE — H&P (Addendum)
TRH H&P   Patient Demographics:    [name], is a 56 y.o. female  MRN: [id number]   DOB - 06-16-1960  Admit Date - 08/24/2016  Outpatient Primary MD for the patient is Irena Cords, MD  Outpatient Specialists: Dr Arrie Aran    Patient coming from: HD Unit  Chief Complaint  Patient presents with  . Loss of Consciousness      HPI:    [name]  is a 56 y.o. female, With history of stroke, dysphagia, recent PEG tube placement, ESRD on Monday, Wednesday and Friday dialysis under Dr. 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 Her PEG tube is still not under use, she went to dialysis unit today and became unresponsive thereafter was brought to the ER.  Patient has dementia and unable to provide good history I called given for nursing home and talked to the nurse on call phone number [phone], per nurse patient was not eating and drinking well for a while and was supposed to be getting D5W at low rate at the nursing home, her sugars were running around 70s and 80s, she was not running any fever and had no subjective complaints prior to be taking to the dialysis unit. At the dialysis unit apparently patient became unresponsive and was brought to the ER where she was found slightly hypothermic and confused. I was called to admit.  At the time of my interview patient is awake, denies any  headache chest or abdominal pain, no nausea vomiting, no diarrhea, no new focal weakness, she does say that her oral intake has been poor. CBG upon arrival here was around 80. Patient has underwent head CT but results are not back. I was called to admit for decreased mental status due to unclear etiology.    Review of systems:    In addition to the HPI above,  No Fever-chills, No Headache, No changes with Vision or hearing, No problems swallowing food or Liquids, No Chest pain, Cough or Shortness of Breath, No Abdominal pain, No Nausea or  Vommitting, Bowel movements are regular, No Blood in stool or Urine, No dysuria, No new skin rashes or bruises, No new joints pains-aches,  No new weakness, tingling, numbness in any extremity, No recent weight gain or loss, No polyuria, polydypsia or polyphagia, No significant Mental Stressors.  A full 10 point Review of Systems was done, except as stated above, all other Review of Systems were negative.   With Past History of the following :    Past Medical History:  Diagnosis Date  . Anemia   . Blood transfusion   . Chronic back pain   . Dementia   . Diabetes mellitus   . Hemodialysis patient (HCC) 03/15/11   "Tues; Thurs; Sat; [facility]"  . Hypertension   . PEA (Pulseless electrical activity) (HCC)   . Peripheral vascular disease (HCC)   . Rash 03/15/11   "admitted me to find out what its' from; UE, waist, thighs, groin"  . Renal failure    hd 4/12  . Stroke [facility])       Past Surgical History:  Procedure Laterality Date  . AMPUTATION  03/04/2011   Procedure: AMPUTATION DIGIT;  Surgeon: [name], MD;  Location: [facility] OR;  Service: Orthopedics;  Laterality: Right;  RT GREAT TOE AMP  . AMPUTATION Right 03/13/2015   Procedure: RIGHT  ABOVE KNEE AMPUTATION;  Surgeon: Pryor Ochoa, MD;  Location: [facility] OR;  Service: Vascular;  Laterality: Right;  . ARTERIOVENOUS GRAFT PLACEMENT  10/01/10   left upper arm  . BELOW  KNEE LEG AMPUTATION  2004   left  . CARPAL TUNNEL RELEASE  ~ 2009   left wrist  . CATARACT EXTRACTION W/ INTRAOCULAR LENS IMPLANT  12/2009   right eye  . HAND AMPUTATION Left   . INSERTION OF DIALYSIS CATHETER  07/29/10   right subclavian  . IR FLUORO RM 30-60 MIN  08/16/2016  . IR GASTROSTOMY TUBE MOD SED  08/23/2016  . TEE WITHOUT CARDIOVERSION  03/18/2011   Procedure: TRANSESOPHAGEAL ECHOCARDIOGRAM (TEE);  Surgeon: [name], MD;  Location: [facility] ENDOSCOPY;  Service: Cardiovascular;  Laterality: N/A;      Social History:     Social History  Substance Use Topics  . Smoking status: Never Smoker  . Smokeless tobacco: Never Used  . Alcohol use No         Family History :     Family History  Problem Relation Age of Onset  . Diabetes Mellitus II Mother   . Stroke Mother        Home Medications:   Prior to Admission medications   Medication Sig Start Date End Date Taking? Authorizing Provider  antiseptic oral rinse (BIOTENE) LIQD 15 mLs by Mouth Rinse route 3 (three) times daily. After meals    Historical Provider, MD  atorvastatin (LIPITOR) 10 MG tablet Take 10 mg by mouth daily.    Historical Provider, MD  glucagon (GLUCAGON EMERGENCY) 1 MG injection Inject 1 mg into the vein once as needed (type 2 diabetes).    Historical Provider, MD  guaiFENesin (ROBITUSSIN) 100 MG/5ML SOLN Take 20 mLs by mouth every 8 (eight) hours.    Historical Provider, MD  guaiFENesin-codeine (ROBITUSSIN AC) 100-10 MG/5ML syrup Take 30 mLs by mouth every 6 (six) hours as needed for cough.    Historical Provider, MD  HYDROcodone-acetaminophen (NORCO/VICODIN) 5-325 MG tablet Take 1-2 tablets by mouth every 6 (six) hours as needed for moderate pain. 06/15/15   Clydia Llano, MD  hydrocortisone cream 1 %  Apply 1 application topically every 8 (eight) hours as needed for itching (apply to back as needed).    Historical Provider, MD  LORazepam (ATIVAN) 2 MG/ML concentrated solution Take 1 mg by mouth once.  ONLY BEFORE DENTAL PROCEDURES    Historical Provider, MD  meclizine (ANTIVERT) 25 MG tablet Take 25 mg by mouth 3 (three) times daily as needed for dizziness.    Historical Provider, MD  mirtazapine (REMERON) 15 MG tablet Take 15 mg by mouth at bedtime.    Historical Provider, MD  nystatin ointment (MYCOSTATIN) Apply 1 application topically 3 (three) times daily after meals. Apply to corners of mouth topically after meals for dry mouth    Historical Provider, MD  ondansetron (ZOFRAN) 4 MG tablet Take 4 mg by mouth every 8 (eight) hours as needed for nausea or vomiting.     Historical Provider, MD  pantoprazole (PROTONIX) 40 MG tablet Take 40 mg by mouth daily.     Historical Provider, MD  promethazine (PHENERGAN) 12.5 MG tablet Take 12.5-25 mg by mouth every 6 (six) hours as needed for nausea or vomiting.     Historical Provider, MD  senna-docusate (SENOKOT-S) 8.6-50 MG tablet Take 2 tablets by mouth daily.    Historical Provider, MD  traMADol (ULTRAM) 50 MG tablet Take 1 tablet (50 mg total) by mouth every 12 (twelve) hours as needed for moderate pain. 06/15/15   Clydia Llano, MD  Venlafaxine HCl 225 MG TB24 Take 225 mg by mouth daily with breakfast.    Historical Provider, MD     Allergies:     Allergies  Allergen Reactions  . Codeine Hives  . Levofloxacin Other (See Comments)    unknown  . Penicillins Hives     Physical Exam:   Vitals  Blood pressure (!) 135/59, pulse 85, temperature 97.4 F (36.3 C), temperature source Oral, resp. rate 15, height 5' (1.524 m), weight 52.2 kg (115 lb), SpO2 100 %.   1. General Frail middle-aged white female lying in hospital bed in no discomfort but appears tired,  2. Normal affect and insight, Not Suicidal or Homicidal, Awake Alert, Oriented X2.  3. 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  4. Ears and Eyes appear Normal, Conjunctivae clear, PERRLA. Moist Oral Mucosa.  5. Supple Neck, No JVD, No cervical lymphadenopathy appriciated, No Carotid Bruits.  6. Symmetrical Chest wall movement, Good air movement bilaterally, CTAB.  7. RRR, No Gallops, Rubs or Murmurs, No Parasternal Heave.  8. Positive Bowel Sounds, Abdomen Soft, No tenderness, No organomegaly appriciated,No rebound -guarding or rigidity. PEG tube in place,  9.  No Cyanosis, Normal Skin Turgor, No Skin Rash or Bruise.  10. Good muscle tone,  joints appear normal , no effusions, Normal ROM.  11. No Palpable Lymph Nodes in Neck or Axillae      Data Review:    CBC  Recent Labs Lab 08/23/16 0928 08/24/16 1540  WBC 7.5 5.9  HGB 12.8 13.1  HCT 39.3 39.0  PLT 185 171  MCV 95.9 94.7  MCH 31.2 31.8  MCHC 32.6 33.6  RDW 17.1* 16.2*   ------------------------------------------------------------------------------------------------------------------  Chemistries   Recent Labs Lab 08/24/16 1543  NA 129*  K 3.3*  CL 95*  CO2 28  GLUCOSE 108*  BUN 5*  CREATININE 1.73*  CALCIUM 8.3*  MG 2.0  AST 43*  ALT 16  ALKPHOS 161*  BILITOT 1.1   ------------------------------------------------------------------------------------------------------------------ estimated creatinine clearance is 27 mL/min (A) (by C-G formula based on SCr of 1.73 mg/dL (H)). ------------------------------------------------------------------------------------------------------------------ No results for input(s): TSH, T4TOTAL, T3FREE, THYROIDAB in the last 72 hours.  Invalid input(s): FREET3  Coagulation profile  Recent Labs Lab 08/23/16 0928  INR 0.98   ------------------------------------------------------------------------------------------------------------------- No results for input(s): DDIMER in the last 72  hours. -------------------------------------------------------------------------------------------------------------------  Cardiac Enzymes No results for input(s): CKMB, TROPONINI, MYOGLOBIN in the last 168 hours.  Invalid input(s): CK ------------------------------------------------------------------------------------------------------------------ No results found for: BNP   ---------------------------------------------------------------------------------------------------------------  Urinalysis    Component Value Date/Time   COLORURINE YELLOW 06/11/2015 0738   APPEARANCEUR CLOUDY (A) 06/11/2015 0738   APPEARANCEUR Turbid 12/15/2011 0928   LABSPEC 1.016 06/11/2015 0738   LABSPEC 1.014 12/15/2011 0928   PHURINE 7.5 06/11/2015 0738   GLUCOSEU 100 (A) 06/11/2015 0738   GLUCOSEU 150 mg/dL 16/01/9603 5409   HGBUR LARGE (A) 06/11/2015 0738   BILIRUBINUR NEGATIVE 06/11/2015 0738   BILIRUBINUR Negative 12/15/2011 0928   KETONESUR NEGATIVE 06/11/2015 0738   PROTEINUR >300 (A) 06/11/2015 0738   UROBILINOGEN 0.2 03/19/2012 1714   NITRITE NEGATIVE 06/11/2015 0738   LEUKOCYTESUR SMALL (A) 06/11/2015 0738   LEUKOCYTESUR 3+ 12/15/2011 0928    ----------------------------------------------------------------------------------------------------------------   Imaging Results:    Dg Chest 2 View  Result Date: 08/24/2016 CLINICAL DATA:  Possible sepsis. EXAM: CHEST  2 VIEW COMPARISON:  Radiographs of June 11, 2015. FINDINGS: The heart size and mediastinal contours are within normal limits. Both lungs are clear. No pneumothorax or pleural effusion is noted. Atherosclerosis of thoracic aorta is noted. The visualized skeletal structures are unremarkable. IMPRESSION: No active cardiopulmonary disease.  Aortic atherosclerosis. Electronically Signed   By: Lupita Raider, M.D.   On: 08/24/2016 17:24   Ir Gastrostomy Tube Mod Sed  Result Date: 08/23/2016 INDICATION: 56 year old female with a  history of dysphagia EXAM: IMAGE GUIDED GASTROSTOMY TUBE MEDICATIONS: 1.0 g vancomycin; Antibiotics were administered within 1 hour of the procedure. ANESTHESIA/SEDATION: Versed 1.0 mg IV; Fentanyl 50 mcg IV Moderate Sedation Time:  10 minutes The patient was continuously monitored during the procedure by the interventional radiology nurse under my direct supervision. CONTRAST:  10 cc - administered into the gastric lumen. FLUOROSCOPY TIME:  Fluoroscopy Time: 1 minutes 24 seconds (13 mGy). COMPLICATIONS: None PROCEDURE: Informed written consent was obtained from the patient's family after a thorough discussion of the procedural risks, benefits and alternatives. All questions were addressed. Maximal Sterile Barrier Technique was utilized including caps, mask, sterile gowns, sterile gloves, sterile drape, hand hygiene and skin antiseptic. A timeout was performed prior to the initiation of the procedure. The procedure, risks, benefits, and alternatives were explained to the patient. Questions regarding the procedure were encouraged and answered. The patient understands and consents to the procedure. The epigastrium was prepped with Betadine in a sterile fashion, and a sterile drape was applied covering the operative field. A sterile gown and sterile gloves were used for the procedure. A 5-French orogastric tube is placed under fluoroscopic guidance. Scout imaging of the abdomen confirms barium within the transverse colon. The stomach was distended with gas. Under fluoroscopic guidance, an 18 gauge needle was utilized to puncture the anterior wall of the body of the stomach. An Amplatz wire was advanced through the needle passing a T fastener into the lumen of the stomach. The T fastener was secured for gastropexy. A 9-French sheath was inserted. A snare was advanced through the 9-French  sheath. A Teena Dunk was advanced through the orogastric tube. It was snared then pulled out the oral cavity, pulling the snare, as well.  The leading edge of the gastrostomy was attached to the snare. It was then pulled down the esophagus and out the percutaneous site. It was secured in place. Contrast was injected. No complication IMPRESSION: Status post percutaneous gastrostomy tube placement. Signed, Yvone Neu. Loreta Ave, DO [facility] Radiology Electronically Signed   By: Gilmer Mor D.O.   On: 08/23/2016 12:07    My personal review of EKG: Rhythm NSR, nonspecific ST changes   Assessment & Plan:      1. Encephalopathy. Unclear etiology could be due to hypoglycemia and hypotension during HD. Currently no focal deficits, patient has no headache, is more awake alert and she is currently oriented 2, will place her on low-dose D5W drip, supportive care for hypothermia, hold any blood pressure medications. Monitor clinically. Monitor CT head results.  2. DM type II with frequent hypoglycemia due to poor oral intake. For now PEG tube has been placed but she has not been getting feeding through it, gentle D5W drip, every 6 hours CBGs check A1c. No long-acting insulin.  3. Dysphagia and stroke. PEG tube placed 08/23/2016, for now medications only via PEG tube then will request IR tomorrow to comment when feeding can be commenced.  4. History of stroke, left BKA, right AKA, left arm amputation. Supportive care. SNF discharge once better. Continue statin and aspirin via PEG tube.  5. ESRD M, W, F dialysis schedule. We will consult nephrology if she stays till Friday.  6. Anxiety. Daily at bedtime Ativan via PEG tube.  7.Non specific EKG changes - pain free, trend troponin, ASA for now, Cards called by EDP.   DVT Prophylaxis Heparin    AM Labs Ordered, also please review Full Orders  Family Communication: Admission, patients condition and plan of care including tests being ordered have been discussed with the patient and POA Kendrick Fries who indicate understanding and agree with the  plan and Code Status.  Code Status DNR  Likely DC to  SNF  Condition GUARDED     Consults called: None    Admission status: Inpt    Time spent in minutes : 35   Susa Raring M.D on 08/24/2016 at 6:25 PM  Between 7am to 7pm - Pager - [phone] ( page via [facility], text pages only, please mention full 10 digit call back number).  After 7pm go to [email or web address] - password [facility]  [facility] - Office  [phone]

## 2016-08-24 NOTE — Consult Note (Signed)
Admit date: 08/24/2016 Referring Physician  Dr. Thedore Mins Primary Cardiologist  Dr. Eden Emms Reason for Consultation  Elevated trop and hypotension  HPI: [name] is a 56 y.o. female who is being seen today for the evaluation of elevated troponin in the setting of hypotension at the request of Dr. Thedore Mins.  This is an unfortunate 55yo WF with a history of DM complicated by ESRD on HD, CVA, PVD, dementia and prior PEA arrest during HD years ago.  She has significant PVD s/p right AKA, left BKA and left amputation.  She was in HD today and about 3/4 of the way through became confused and was noted to be hypotensive and hypothermic. She was transported to the ED where temp was 94.73F and bear hugger was placed.  She denies any fever, N/V, chest pain, SOB, DOE, diarrhea, cough, dysuria.  She has had some chills.  She was noted to have an elevated POC trop of 0.11 and abnormal EKG and Cardiology is now asked to consult.      PMH:   Past Medical History:  Diagnosis Date  . Anemia   . Blood transfusion   . Chronic back pain   . Dementia   . Diabetes mellitus   . Hemodialysis patient (HCC) 03/15/11   "Tues; Thurs; Sat; [facility]"  . Hypertension   . PEA (Pulseless electrical activity) (HCC)   . Peripheral vascular disease (HCC)   . Rash 03/15/11   "admitted me to find out what its' from; UE, waist, thighs, groin"  . Renal failure    hd 4/12  . Stroke [facility])      PSH:   Past Surgical History:  Procedure Laterality Date  . AMPUTATION  03/04/2011   Procedure: AMPUTATION DIGIT;  Surgeon: [name], MD;  Location: [facility] OR;  Service: Orthopedics;  Laterality: Right;  RT GREAT TOE AMP  . AMPUTATION Right 03/13/2015   Procedure: RIGHT  ABOVE KNEE AMPUTATION;  Surgeon: Pryor Ochoa, MD;  Location: [facility] OR;  Service: Vascular;  Laterality: Right;  . ARTERIOVENOUS GRAFT PLACEMENT  10/01/10   left upper arm  . BELOW KNEE LEG AMPUTATION  2004   left  . CARPAL TUNNEL RELEASE  ~  2009   left wrist  . CATARACT EXTRACTION W/ INTRAOCULAR LENS IMPLANT  12/2009   right eye  . HAND AMPUTATION Left   . INSERTION OF DIALYSIS CATHETER  07/29/10   right subclavian  . IR FLUORO RM 30-60 MIN  08/16/2016  . IR GASTROSTOMY TUBE MOD SED  08/23/2016  . TEE WITHOUT CARDIOVERSION  03/18/2011   Procedure: TRANSESOPHAGEAL ECHOCARDIOGRAM (TEE);  Surgeon: [name], MD;  Location: [facility] ENDOSCOPY;  Service: Cardiovascular;  Laterality: N/A;    Allergies:  Codeine; Levofloxacin; and Penicillins Prior to Admit Meds:   (Not in a hospital admission) Fam HX:    Family History  Problem Relation Age of Onset  . Diabetes Mellitus II Mother   . Stroke Mother    Social HX:    Social History   Social History  . Marital status: Married    Spouse name: N/A  . Number of children: N/A  . Years of education: N/A   Occupational History  . Not on file.   Social History Main Topics  . Smoking status: Never Smoker  . Smokeless tobacco: Never Used  . Alcohol use No  . Drug use: No  . Sexual activity: No   Other Topics Concern  . Not on file  Social History Narrative  . No narrative on file     ROS:  All  ROS were addressed and are negative except what is stated in the HPI  Physical Exam: Blood pressure (!) 135/59, pulse 85, temperature 97.4 F (36.3 C), temperature source Oral, resp. rate 15, height 5' (1.524 m), weight 115 lb (52.2 kg), SpO2 100 %.    General: thin, ill appearing female in no acute distress Head: Eyes PERRLA, No xanthomas.   Normal cephalic and atramatic  Lungs:   Clear bilaterally to auscultation and percussion. Heart:   HRRR S1 S2 Pulses are 2+ & equal.            No carotid bruit. No JVD.   Abdomen: Bowel sounds are positive.  Abdomen tender to palpation in the upper abdomen in the midline to LUQ. Msk:  Back normal, normal gait. Normal strength and tone for age. Extremities:   No clubbing, cyanosis or edema.  Right BKA and left AKA and amputated left  hand Neuro: Alert and oriented X 3. Psych:  Good affect, responds appropriately    Labs:   Lab Results  Component Value Date   WBC 5.9 08/24/2016   HGB 13.1 08/24/2016   HCT 39.0 08/24/2016   MCV 94.7 08/24/2016   PLT 171 08/24/2016    Recent Labs Lab 08/24/16 1543  NA 129*  K 3.3*  CL 95*  CO2 28  BUN 5*  CREATININE 1.73*  CALCIUM 8.3*  PROT 6.5  BILITOT 1.1  ALKPHOS 161*  ALT 16  AST 43*  GLUCOSE 108*   No results found for: PTT Lab Results  Component Value Date   INR 0.98 08/23/2016   INR 0.97 08/16/2016   INR 1.17 02/25/2012   Lab Results  Component Value Date   CKTOTAL 46 03/19/2012   TROPONINI <0.30 11/15/2013     Lab Results  Component Value Date   CHOL 106 12/16/2011   CHOL 146 07/31/2011   CHOL 195 03/15/2011   Lab Results  Component Value Date   HDL 28 (L) 12/16/2011   HDL 37 (L) 07/31/2011   HDL 43 03/15/2011   Lab Results  Component Value Date   LDLCALC 57 12/16/2011   LDLCALC 88 07/31/2011   LDLCALC 130 (H) 03/15/2011   Lab Results  Component Value Date   TRIG 104 12/16/2011   TRIG 106 07/31/2011   TRIG 108 03/15/2011   Lab Results  Component Value Date   CHOLHDL 4.5 03/15/2011   No results found for: LDLDIRECT    Radiology:  Dg Chest 2 View  Result Date: 08/24/2016 CLINICAL DATA:  Possible sepsis. EXAM: CHEST  2 VIEW COMPARISON:  Radiographs of June 11, 2015. FINDINGS: The heart size and mediastinal contours are within normal limits. Both lungs are clear. No pneumothorax or pleural effusion is noted. Atherosclerosis of thoracic aorta is noted. The visualized skeletal structures are unremarkable. IMPRESSION: No active cardiopulmonary disease.  Aortic atherosclerosis. Electronically Signed   By: Lupita Raider, M.D.   On: 08/24/2016 17:24   Ir Gastrostomy Tube Mod Sed  Result Date: 08/23/2016 INDICATION: 56 year old female with a history of dysphagia EXAM: IMAGE GUIDED GASTROSTOMY TUBE MEDICATIONS: 1.0 g vancomycin;  Antibiotics were administered within 1 hour of the procedure. ANESTHESIA/SEDATION: Versed 1.0 mg IV; Fentanyl 50 mcg IV Moderate Sedation Time:  10 minutes The patient was continuously monitored during the procedure by the interventional radiology nurse under my direct supervision. CONTRAST:  10 cc - administered into the gastric lumen.  FLUOROSCOPY TIME:  Fluoroscopy Time: 1 minutes 24 seconds (13 mGy). COMPLICATIONS: None PROCEDURE: Informed written consent was obtained from the patient's family after a thorough discussion of the procedural risks, benefits and alternatives. All questions were addressed. Maximal Sterile Barrier Technique was utilized including caps, mask, sterile gowns, sterile gloves, sterile drape, hand hygiene and skin antiseptic. A timeout was performed prior to the initiation of the procedure. The procedure, risks, benefits, and alternatives were explained to the patient. Questions regarding the procedure were encouraged and answered. The patient understands and consents to the procedure. The epigastrium was prepped with Betadine in a sterile fashion, and a sterile drape was applied covering the operative field. A sterile gown and sterile gloves were used for the procedure. A 5-French orogastric tube is placed under fluoroscopic guidance. Scout imaging of the abdomen confirms barium within the transverse colon. The stomach was distended with gas. Under fluoroscopic guidance, an 18 gauge needle was utilized to puncture the anterior wall of the body of the stomach. An Amplatz wire was advanced through the needle passing a T fastener into the lumen of the stomach. The T fastener was secured for gastropexy. A 9-French sheath was inserted. A snare was advanced through the 9-French sheath. A Teena Dunk was advanced through the orogastric tube. It was snared then pulled out the oral cavity, pulling the snare, as well. The leading edge of the gastrostomy was attached to the snare. It was then pulled down  the esophagus and out the percutaneous site. It was secured in place. Contrast was injected. No complication IMPRESSION: Status post percutaneous gastrostomy tube placement. Signed, Yvone Neu. Loreta Ave, DO [facility] Radiology Electronically Signed   By: Gilmer Mor D.O.   On: 08/23/2016 12:07     Telemetry    NSR - Personally Reviewed  ECG    NSR with marked anterior ST abnormality and prolonged QT - Personally Reviewed   ASSESSMENT/PLAN:    1.  Elevated troponin in the setting of ESRD and hypotension.  She denies any chest pain but has significant PVD and DM so likely has underlying CAD.  Her EKG is markedly abnormal but this has been present in the past but not to the same degree. 2D echo in 2013 showed normal LVF.  She is hypothermic and hypotension on admission ? Whether she is septic.  Her abdomen is tender to palpation and worrisome for intra abdominal process.  Lactic acid is normal. Will cycle troponin.  Check 2D echo in am to assess LVF.  Continue statin.  Start ASA  daily if ok with Renal.  Would anticoagulate with IV Heparin gtt if no contraindication.  No BB secondary to soft BP.  2.  Prolonged QTc in setting of electrolyte abnormalites including hypokalemia and hyponatremia.  Will stop meclizine, zofran, venlafaxine and phenergan.  Repeat BMET when electrolyte abnormalities corrected.   3.  PVD s/p bilateral LE amputations and LUE amputation.   Armanda Magic, MD  08/24/2016  6:18 PM

## 2016-08-24 NOTE — ED Notes (Signed)
Pt returned from CT via stretcher.

## 2016-08-24 NOTE — ED Notes (Signed)
Pt to xray with warm blankets

## 2016-08-24 NOTE — ED Triage Notes (Addendum)
Pt arrives EMS from dialysis where she had 3 hours of treatment then 5 minute episode of unresponsiveness. Pt arrives alert and reponsive to questions. Pt had pe tube placed 2 days ao that has not been used. Pt husband died 1 month ago. Amputation at left hand from PVD. Opt arrives with dialysis cath accessed.

## 2016-08-24 NOTE — ED Notes (Signed)
I-stat troponin result given to Dr. Erma Heritage

## 2016-08-24 NOTE — ED Notes (Signed)
Bear hugger o and warm blankets applied.

## 2016-08-24 NOTE — ED Notes (Signed)
Informed Dr. Erma Heritage of pt's Troponin result.

## 2016-08-24 NOTE — ED Notes (Signed)
Pt placed on bear hugger.  

## 2016-08-25 ENCOUNTER — Inpatient Hospital Stay (HOSPITAL_COMMUNITY): Payer: Medicare Other

## 2016-08-25 DIAGNOSIS — I953 Hypotension of hemodialysis: Secondary | ICD-10-CM

## 2016-08-25 LAB — BLOOD CULTURE ID PANEL (REFLEXED)
ACINETOBACTER BAUMANNII: NOT DETECTED
CANDIDA ALBICANS: NOT DETECTED
CANDIDA GLABRATA: NOT DETECTED
CANDIDA KRUSEI: NOT DETECTED
CANDIDA PARAPSILOSIS: NOT DETECTED
CARBAPENEM RESISTANCE: NOT DETECTED
Candida tropicalis: NOT DETECTED
ENTEROCOCCUS SPECIES: NOT DETECTED
Enterobacter cloacae complex: NOT DETECTED
Enterobacteriaceae species: NOT DETECTED
Escherichia coli: NOT DETECTED
Haemophilus influenzae: NOT DETECTED
KLEBSIELLA OXYTOCA: NOT DETECTED
KLEBSIELLA PNEUMONIAE: NOT DETECTED
Listeria monocytogenes: NOT DETECTED
Neisseria meningitidis: NOT DETECTED
Proteus species: NOT DETECTED
Pseudomonas aeruginosa: DETECTED — AB
STREPTOCOCCUS PYOGENES: NOT DETECTED
Serratia marcescens: NOT DETECTED
Staphylococcus aureus (BCID): NOT DETECTED
Staphylococcus species: NOT DETECTED
Streptococcus agalactiae: NOT DETECTED
Streptococcus pneumoniae: NOT DETECTED
Streptococcus species: NOT DETECTED

## 2016-08-25 LAB — GLUCOSE, CAPILLARY
GLUCOSE-CAPILLARY: 61 mg/dL — AB (ref 65–99)
GLUCOSE-CAPILLARY: 67 mg/dL (ref 65–99)
GLUCOSE-CAPILLARY: 56 mg/dL — AB (ref 65–99)
GLUCOSE-CAPILLARY: 103 mg/dL — AB (ref 65–99)
Glucose-Capillary: 62 mg/dL — ABNORMAL LOW (ref 65–99)
Glucose-Capillary: 132 mg/dL — ABNORMAL HIGH (ref 65–99)
Glucose-Capillary: 71 mg/dL (ref 65–99)
Glucose-Capillary: 67 mg/dL (ref 65–99)
Glucose-Capillary: 78 mg/dL (ref 65–99)

## 2016-08-25 LAB — BASIC METABOLIC PANEL
ANION GAP: 10 (ref 5–15)
BUN: 8 mg/dL (ref 6–20)
CHLORIDE: 95 mmol/L — AB (ref 101–111)
CO2: 25 mmol/L (ref 22–32)
Calcium: 8.4 mg/dL — ABNORMAL LOW (ref 8.9–10.3)
Creatinine, Ser: 2.02 mg/dL — ABNORMAL HIGH (ref 0.44–1.00)
GFR calc non Af Amer: 27 mL/min — ABNORMAL LOW (ref >60)
GFR, EST AFRICAN AMERICAN: 31 mL/min — AB (ref >60)
GLUCOSE: 62 mg/dL — AB (ref 65–99)
POTASSIUM: 3.6 mmol/L (ref 3.5–5.1)
Sodium: 130 mmol/L — ABNORMAL LOW (ref 135–145)

## 2016-08-25 LAB — CBC
HCT: 37.2 % (ref 36.0–46.0)
Hemoglobin: 12.4 g/dL (ref 12.0–15.0)
MCH: 31.3 pg (ref 26.0–34.0)
MCHC: 33.3 g/dL (ref 30.0–36.0)
MCV: 93.9 fL (ref 78.0–100.0)
Platelets: 131 10*3/uL — ABNORMAL LOW (ref 150–400)
RBC: 3.96 MIL/uL (ref 3.87–5.11)
RDW: 16 % — ABNORMAL HIGH (ref 11.5–15.5)
WBC: 10.6 10*3/uL — AB (ref 4.0–10.5)

## 2016-08-25 LAB — TROPONIN I
Troponin I: 0.03 ng/mL (ref <0.03)
Troponin I: <0.03 ng/mL (ref <0.03)

## 2016-08-25 MED ORDER — DEXTROSE 5 % IV SOLN: INTRAVENOUS | Status: DC

## 2016-08-25 MED ORDER — DEXTROSE 50 % IV SOLN
INTRAVENOUS | Status: AC
  Administered 2016-08-25: 50 mL
  Filled 2016-08-25: qty 50

## 2016-08-25 MED ORDER — DEXTROSE 50 % IV SOLN
25.0000 mL | Freq: Once | INTRAVENOUS | Status: AC
  Administered 2016-08-25: 25 mL via INTRAVENOUS

## 2016-08-25 MED ORDER — DEXTROSE 5 % IV SOLN
2.0000 g | Freq: Once | INTRAVENOUS | Status: AC
  Administered 2016-08-25: 2 g via INTRAVENOUS
  Filled 2016-08-25: qty 2

## 2016-08-25 MED ORDER — LEVOTHYROXINE SODIUM 25 MCG PO TABS
25.0000 ug | ORAL_TABLET | Freq: Every day | ORAL | Status: DC
  Administered 2016-08-25 – 2016-08-27 (×3): 25 ug
  Filled 2016-08-25 (×3): qty 1

## 2016-08-25 MED ORDER — DIPHENHYDRAMINE HCL 50 MG/ML IJ SOLN: 50.0000 mg | Freq: Every day | INTRAMUSCULAR | Status: DC | PRN

## 2016-08-25 MED ORDER — GLUCOSE 40 % PO GEL
ORAL | Status: AC
  Administered 2016-08-25: 37.5 g
  Filled 2016-08-25: qty 1

## 2016-08-25 MED ORDER — DEXTROSE 5 % IV SOLN
2.0000 g | INTRAVENOUS | Status: DC
  Filled 2016-08-25: qty 2

## 2016-08-25 NOTE — Progress Notes (Signed)
Initial Nutrition Assessment  DOCUMENTATION CODES:   Non-severe (moderate) malnutrition in context of chronic illness  INTERVENTION:   Encourage po intake at meals.  Start bolus TF 5/4 at low volume and increase as tolerated: 1/2 can Nepro TID  As tolerated will advance to 1 can at 8 am, 1 can at 12 pm, and 1.5 can at 4 pm. Provides: 1487 kcal, 66 grams protein, and 602 ml free water  Monitor magnesium, potassium, and phosphorus daily for at least 3 days, MD to replete as needed, as pt is at risk for refeeding syndrome given moderate malnutrition.   NUTRITION DIAGNOSIS:   Malnutrition (Moderate) related to chronic illness (ESRD) as evidenced by moderate depletions of muscle mass, moderate depletion of body fat.   GOAL:   Patient will meet greater than or equal to 90% of their needs  MONITOR:   PO intake, TF tolerance, Labs  REASON FOR ASSESSMENT:   Consult Assessment of nutrition requirement/status (Start TF 5/4)  ASSESSMENT:   Pt with hx of ESRD on HD MWF, DM, severe PVD, dementia, R AKA, L BKA, L hand amputation. Husband died 2/18 and has been declining. SIL making decisions. Admitted from HD with loss of consciousness   Pt unable to answer questions appropriately. She reports that PEG was removed yesterday.  PEG inserted 5/3 by IR, ok to use tomorrow morning Lunch at bedside untouched  Nutrition-Focused physical exam completed. Findings are mild/moderate fat depletion, mild/moderate muscle depletion, and no edema.  CBG's: 67-56 Unable to determine weight or intake history at this time.  Per chart review pt has lost 13% of her weight x 1 month but unable to confirm.   Diet Order:  DIET DYS 3 Room service appropriate? Yes; Fluid consistency: Thin  Skin:  Reviewed, no issues  Last BM:  5/3  Height:   Ht Readings from Last 1 Encounters:  08/25/16 5\' 4"  (1.626 m)    Weight:   Wt Readings from Last 1 Encounters:  08/24/16 113 lb 1.5 oz (51.3 kg)    Ideal  Body Weight:  46.2 kg  BMI:  Adjusted BMI 22.9  Estimated Nutritional Needs:   Kcal:  1400-1600  Protein:  65-71 grams  Fluid:  1.2 L/day  EDUCATION NEEDS:   No education needs identified at this time  Kendell BaneHeather Kamarian Sahakian RD, LDN, CNSC [phone] Pager [phone] After Hours Pager

## 2016-08-25 NOTE — Progress Notes (Signed)
Hypoglycemic Event  CBG:61   Treatment: 25ml dextrose 50%  Symptoms: asymptomatic   Follow-up CBG: Time: 0500 CBG Result: 132  Possible Reasons for Event: poor nutritional intake  MD notified    [name] Tedric Leeth

## 2016-08-25 NOTE — Evaluation (Signed)
Clinical/Bedside Swallow Evaluation Patient Details  Name: [name] MRN: [id number] Date of Birth: 1961-04-04  Today's Date: 08/25/2016 Time: SLP Start Time (ACUTE ONLY): 4098 SLP Stop Time (ACUTE ONLY): 0930 SLP Time Calculation (min) (ACUTE ONLY): 25 min  Past Medical History:  Past Medical History:  Diagnosis Date  . Anemia   . Blood transfusion   . Chronic back pain   . Dementia   . Diabetes mellitus   . Hemodialysis patient (HCC) 03/15/11   "Tues; Thurs; Sat; [facility]"  . Hypertension   . PEA (Pulseless electrical activity) (HCC)   . Peripheral vascular disease (HCC)   . Rash 03/15/11   "admitted me to find out what its' from; UE, waist, thighs, groin"  . Renal failure    hd 4/12  . Stroke [facility])    Past Surgical History:  Past Surgical History:  Procedure Laterality Date  . AMPUTATION  03/04/2011   Procedure: AMPUTATION DIGIT;  Surgeon: [name], MD;  Location: [facility] OR;  Service: Orthopedics;  Laterality: Right;  RT GREAT TOE AMP  . AMPUTATION Right 03/13/2015   Procedure: RIGHT  ABOVE KNEE AMPUTATION;  Surgeon: Pryor Ochoa, MD;  Location: [facility] OR;  Service: Vascular;  Laterality: Right;  . ARTERIOVENOUS GRAFT PLACEMENT  10/01/10   left upper arm  . BELOW KNEE LEG AMPUTATION  2004   left  . CARPAL TUNNEL RELEASE  ~ 2009   left wrist  . CATARACT EXTRACTION W/ INTRAOCULAR LENS IMPLANT  12/2009   right eye  . HAND AMPUTATION Left   . INSERTION OF DIALYSIS CATHETER  07/29/10   right subclavian  . IR FLUORO RM 30-60 MIN  08/16/2016  . IR GASTROSTOMY TUBE MOD SED  08/23/2016  . TEE WITHOUT CARDIOVERSION  03/18/2011   Procedure: TRANSESOPHAGEAL ECHOCARDIOGRAM (TEE);  Surgeon: [name], MD;  Location: [facility] ENDOSCOPY;  Service: Cardiovascular;  Laterality: N/A;   HPI:  56 yo female adm to [facility] with AMS, lethargy after dialysis.  Pt PMH + for CVA, dysphagia, ESRD in dialysis, multiple amputations.  Pt resides at [facility] and provided  SlP permission to call to determine premorbid diet.  She was on soft/chopped meats/thin prior to admission - RN denies pt having dysphagia and states she tolerated po well but did not eat and therefore received PEG for FTT.     Assessment / Plan / Recommendation Clinical Impression  Pt presents with minimal oral difficulties c/b decreased mandibular opening - ? source.   Slow but effective mastication noted - with pt reporting minimal oral residuals of cracker that cleared with juice intake.  No other CN deficits apparent and pt tolerating po of cracker, pudding and thin liquids.  Per RN at facility, pt can swallow but she does not eat and thus is reason for PEG.   Recommend allow soft/thin diet for enjoyment with general precautions.  Will have pt's meats ground due to her decreased mandibular opening.  No SlP follow up indicated.   SLP Visit Diagnosis: Dysphagia, oral phase (R13.11)    Aspiration Risk  Mild aspiration risk    Diet Recommendation Dysphagia 3 (Mech soft);Thin liquid   Liquid Administration via: Cup;Straw Medication Administration: Whole meds with liquid Supervision: Patient able to self feed Compensations: Slow rate;Small sips/bites Postural Changes: Seated upright at 90 degrees;Remain upright for at least 30 minutes after po intake    Other  Recommendations Oral Care Recommendations: Oral care BID   Follow up Recommendations None  Frequency and Duration            Prognosis        Swallow Study   General Date of Onset: 08/25/16 HPI: 56 yo female [facility] with [facility]MS, lethargy after dialysis.  Pt PMH + for CVA, dysphagia, ESRD in dialysis, multiple amputations.  Pt resides at [facility] and provided SlP permission to call to determine premorbid diet.  She was on soft/chopped meats/thin prior to admission - RN denies pt having dysphagia and states she tolerated po well but did not eat and therefore received PEG for FTT.   Type of Study: Bedside Swallow  Evaluation Diet Prior to this Study: Thin liquids;Dysphagia 3 (soft) Temperature Spikes Noted: No History of Recent Intubation: No Behavior/Cognition: Alert;Cooperative;Pleasant mood Oral Cavity Assessment: Within Functional Limits Oral Care Completed by SLP: No Oral Cavity - Dentition: Adequate natural dentition Vision: Functional for self-feeding Self-Feeding Abilities: Able to feed self;Needs set up Patient Positioning: Upright in bed Baseline Vocal Quality: Normal Volitional Cough: Strong Volitional Swallow: Able to elicit    Oral/Motor/Sensory Function Overall Oral Motor/Sensory Function: Within functional limits (decreased jaw opening)   Ice Chips Ice chips: Not tested   Thin Liquid Thin Liquid: Within functional limits Presentation: Cup;Self Fed;Straw    Nectar Thick Nectar Thick Liquid: Not tested   Honey Thick Honey Thick Liquid: Not tested   Puree Puree: Within functional limits Presentation: Self Fed;Spoon   Solid   GO   Solid: Within functional limits Presentation: Self Lisabeth PickFed        Dustyn Dansereau, MS [facility] SLP [phone]

## 2016-08-25 NOTE — Progress Notes (Signed)
Patient ID: [name], female   DOB: Oct 24, 1960, 56 y.o.   MRN: [id number]  Interventional Radiology Procedure Note  Procedure: Placement of percutaneous 14F pull-through gastrostomy tube. Complications: None Recommendations: - NPO except for sips and chips remainder of today and overnight - Maintain G-tube to LWS until tomorrow morning  - May advance diet as tolerated and begin using tube tomorrow morning  Will place nursing order to start tube feeds

## 2016-08-25 NOTE — Care Management Note (Signed)
Case Management Note  Patient Details  Name: [name] MRN: [id number] Date of Birth: 1960/11/08  Subjective/Objective:   Pt admitted with AMS during HD session                  Action/Plan:   PTA from SNF - CSW consulted.  Pt is a left arm and bilateral leg amputee.     Expected Discharge Date:                  Expected Discharge Plan:  Skilled Nursing Facility (from facility)  In-House Referral:  Clinical Social Work  Discharge planning Services  CM Consult  Post Acute Care Choice:    Choice offered to:     DME Arranged:    DME Agency:     HH Arranged:    HH Agency:     Status of Service:     If discussed at [employer] Meetings, dates discussed:    Additional Comments:  Cherylann ParrClaxton, Bonnie Reising S, RN 08/25/2016, 8:56 AM

## 2016-08-25 NOTE — Progress Notes (Signed)
PHARMACY - PHYSICIAN COMMUNICATION CRITICAL VALUE ALERT - BLOOD CULTURE IDENTIFICATION (BCID)  Results for orders placed or performed during the hospital encounter of 08/24/16  Blood Culture ID Panel (Reflexed) (Collected: 08/24/2016  5:43 PM)  Result Value Ref Range   Enterococcus species NOT DETECTED NOT DETECTED   Listeria monocytogenes NOT DETECTED NOT DETECTED   Staphylococcus species NOT DETECTED NOT DETECTED   Staphylococcus aureus NOT DETECTED NOT DETECTED   Streptococcus species NOT DETECTED NOT DETECTED   Streptococcus agalactiae NOT DETECTED NOT DETECTED   Streptococcus pneumoniae NOT DETECTED NOT DETECTED   Streptococcus pyogenes NOT DETECTED NOT DETECTED   Acinetobacter baumannii NOT DETECTED NOT DETECTED   Enterobacteriaceae species NOT DETECTED NOT DETECTED   Enterobacter cloacae complex NOT DETECTED NOT DETECTED   Escherichia coli NOT DETECTED NOT DETECTED   Klebsiella oxytoca NOT DETECTED NOT DETECTED   Klebsiella pneumoniae NOT DETECTED NOT DETECTED   Proteus species NOT DETECTED NOT DETECTED   Serratia marcescens NOT DETECTED NOT DETECTED   Carbapenem resistance NOT DETECTED NOT DETECTED   Haemophilus influenzae NOT DETECTED NOT DETECTED   Neisseria meningitidis NOT DETECTED NOT DETECTED   Pseudomonas aeruginosa DETECTED (A) NOT DETECTED   Candida albicans NOT DETECTED NOT DETECTED   Candida glabrata NOT DETECTED NOT DETECTED   Candida krusei NOT DETECTED NOT DETECTED   Candida parapsilosis NOT DETECTED NOT DETECTED   Candida tropicalis NOT DETECTED NOT DETECTED    Name of physician (or Provider) Contacted:  Dr. Ezequiel EssexSinh  Changes to prescribed antibiotics required:  Start Cordie GriceFortaz   Tequila Rottmann D. Laney Potashang, PharmD, BCPS Pager:  [phone] 08/25/2016, 4:04 PM

## 2016-08-25 NOTE — Progress Notes (Signed)
PROGRESS NOTE                                                                                                                                                                                                             Patient Demographics:    [name], is a 56 y.o. female, DOB - [birth year], ZOX:[id number]  Admit date - 08/24/2016   Admitting Physician Leroy Sea, MD  Outpatient Primary MD for the patient is Irena Cords, MD  LOS - 1  Chief Complaint  Patient presents with  . Loss of Consciousness       Brief Narrative   [name]  is a 56 y.o. female, With history of stroke, dysphagia, recent PEG tube placement, ESRD on Monday, Wednesday and Friday dialysis under Dr. 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 Her PEG tube is still not under use, she went to dialysis unit today and became unresponsive thereafter was brought to the ER   Subjective:    Peg Fifer today has, No headache, No chest pain, No abdominal pain - No Nausea, No new weakness tingling or numbness, No Cough - SOB.     Assessment  & Plan :    1. Encephalopathy. Unclear etiology likely from combination of Hypotension post dialysis and Hypoglycemia from poor Oral intake, CT head -ve, is now at baseline post D5W drip and now stable BP and CBG, no infection.  2. DM type II with frequent hypoglycemia due to poor oral intake. For now PEG tube has been placed but she has not been getting feeding through it, gentle D5W drip, every 6 hours CBGs , hold long-acting insulin.  Lab Results  Component Value Date   HGBA1C 6.0  (H) 06/11/2015   CBG (last 3)   Recent Labs  08/24/16 2116 08/25/16 0409 08/25/16 0516  GLUCAP 61* 61* 132*     3. Dysphagia and stroke. PEG tube placed 08/23/2016, for now medications only via PEG tube , per IR start TF 08-26-16. Speech to eval as well.  4. History  of stroke, left BKA, right AKA, left arm amputation. Supportive care. SNF discharge once better. Continue statin and aspirin via PEG tube.  5. ESRD M, W, F dialysis schedule. Renal called.  6. Anxiety. Daily at bedtime Ativan via PEG tube.  7.Non specific EKG changes - pain free, Trop stable, on ASA for now, Cards on board.  8.High Qtc - Home Venlafaxine stopped, monitor,  9. Hypothyroidism - placed on Synthroid (with hypoglycemia, poor apetite, mild hypothermia).     Diet : Diet NPO time specified Except for: Sips with Meds    Family Communication  :  POA  Code Status :  DNR  Disposition Plan  :  SNF in 1-2 days  Consults  :  Renal, Cards  Procedures  :    CT head - -ve  DVT Prophylaxis  :   Heparin   Lab Results  Component Value Date   PLT 131 (L) 08/25/2016    Inpatient Medications  Scheduled Meds: . aspirin  81 mg Per Tube Daily  . atorvastatin  10 mg Per Tube Daily  . dextrose      . heparin subcutaneous  5,000 Units Subcutaneous Q8H  . insulin aspart  0-9 Units Subcutaneous Q6H   Continuous Infusions: . dextrose 50 mL/hr at 08/24/16 2111   PRN Meds:.albuterol  Antibiotics  :    Anti-infectives    None         Objective:   Vitals:   08/24/16 2319 08/25/16 0000 08/25/16 0407 08/25/16 0843  BP: (!) 69/40 133/70 113/62 (!) 142/95  Pulse: 81 80 80 84  Resp: 12 14 (!) 9 18  Temp: 97.5 F (36.4 C)  97.6 F (36.4 C) 97.7 F (36.5 C)  TempSrc: Oral  Oral Oral  SpO2: 100% 100% 100% 100%  Weight:      Height:        Wt Readings from Last 3 Encounters:  08/24/16 51.3 kg (113 lb 1.5 oz)  08/23/16 52.9 kg (116 lb 10 oz)  08/16/16 59 kg (130 lb)     Intake/Output  Summary (Last 24 hours) at 08/25/16 0916 Last data filed at 08/24/16 2300  Gross per 24 hour  Intake            90.83 ml  Output                0 ml  Net            90.83 ml     Physical Exam  Awake Alert, Oriented X 2, No new F.N deficits, Normal affect [place].AT,PERRAL Supple Neck,No JVD, No cervical lymphadenopathy appriciated.  Symmetrical Chest wall movement, Good air movement bilaterally, CTAB RRR,No Gallops,Rubs or new Murmurs, No Parasternal Heave +ve B.Sounds, Abd Soft, No tenderness, No organomegaly appriciated, No rebound - guarding or rigidity. PEG in place  R BKA, L AKA, L Arm amputated     Data Review:    CBC  Recent Labs Lab 08/23/16 0928 08/24/16 1540 08/25/16 0316  WBC 7.5 5.9 10.6*  HGB 12.8 13.1 12.4  HCT 39.3 39.0 37.2  PLT 185 171 131*  MCV 95.9 94.7 93.9  MCH 31.2 31.8 31.3  MCHC 32.6 33.6 33.3  RDW 17.1* 16.2* 16.0*    Chemistries   Recent Labs Lab 08/24/16 1543 08/25/16 0316  NA 129* 130*  K 3.3* 3.6  CL 95* 95*  CO2 28 25  GLUCOSE 108* 62*  BUN 5* 8  CREATININE 1.73* 2.02*  CALCIUM 8.3* 8.4*  MG  2.0  --   AST 43*  --   ALT 16  --   ALKPHOS 161*  --   BILITOT 1.1  --    ------------------------------------------------------------------------------------------------------------------ No results for input(s): CHOL, HDL, LDLCALC, TRIG, CHOLHDL, LDLDIRECT in the last 72 hours.  Lab Results  Component Value Date   HGBA1C 6.0 (H) 06/11/2015   ------------------------------------------------------------------------------------------------------------------  Recent Labs  08/24/16 1737  TSH 10.066*   ------------------------------------------------------------------------------------------------------------------ No results for input(s): VITAMINB12, FOLATE, FERRITIN, TIBC, IRON, RETICCTPCT in the last 72 hours.  Coagulation profile  Recent Labs Lab 08/23/16 0928  INR 0.98    No results for input(s): DDIMER in the last 72  hours.  Cardiac Enzymes  Recent Labs Lab 08/24/16 2118 08/25/16 0316  TROPONINI 0.03* 0.03*   ------------------------------------------------------------------------------------------------------------------ No results found for: BNP  Micro Results Recent Results (from the past 240 hour(s))  Culture, blood (Routine X 2) w Reflex to ID Panel     Status: None (Preliminary result)   Collection Time: 08/24/16  5:30 PM  Result Value Ref Range Status   Specimen Description BLOOD RIGHT ANTECUBITAL  Final   Special Requests   Final    BOTTLES DRAWN AEROBIC AND ANAEROBIC Blood Culture adequate volume   Culture NO GROWTH < 24 HOURS  Final   Report Status PENDING  Incomplete  Culture, blood (Routine X 2) w Reflex to ID Panel     Status: None (Preliminary result)   Collection Time: 08/24/16  5:43 PM  Result Value Ref Range Status   Specimen Description BLOOD RIGHT HAND  Final   Special Requests   Final    BOTTLES DRAWN AEROBIC AND ANAEROBIC Blood Culture adequate volume   Culture NO GROWTH < 24 HOURS  Final   Report Status PENDING  Incomplete  MRSA PCR Screening     Status: None   Collection Time: 08/24/16  8:38 PM  Result Value Ref Range Status   MRSA by PCR NEGATIVE NEGATIVE Final    Comment:        The GeneXpert MRSA Assay (FDA approved for NASAL specimens only), is one component of a comprehensive MRSA colonization surveillance program. It is not intended to diagnose MRSA infection nor to guide or monitor treatment for MRSA infections.     Radiology Reports Dg Chest 2 View  Result Date: 08/24/2016 CLINICAL DATA:  Possible sepsis. EXAM: CHEST  2 VIEW COMPARISON:  Radiographs of June 11, 2015. FINDINGS: The heart size and mediastinal contours are within normal limits. Both lungs are clear. No pneumothorax or pleural effusion is noted. Atherosclerosis of thoracic aorta is noted. The visualized skeletal structures are unremarkable. IMPRESSION: No active cardiopulmonary  disease.  Aortic atherosclerosis. Electronically Signed   By: Lupita RaiderJames  Green Jr, M.D.   On: 08/24/2016 17:24   Ct Head Wo Contrast  Result Date: 08/24/2016 CLINICAL DATA:  Episode of unresponsiveness following dialysis. EXAM: CT HEAD WITHOUT CONTRAST TECHNIQUE: Contiguous axial images were obtained from the base of the skull through the vertex without intravenous contrast. COMPARISON:  06/13/2016 FINDINGS: Brain: No evidence of acute infarction, hemorrhage, hydrocephalus, extra-axial collection or mass lesion/mass effect. The ventricles and sulci are enlarged, greater than expected for patient age, reflecting moderate generalized atrophy. There is also patchy white matter hypoattenuation that is consistent with mild to moderate chronic microvascular ischemic change. Findings are stable from the prior exam. Vascular: No hyperdense vessel or unexpected calcification. Skull: Normal. Negative for fracture or focal lesion. Sinuses/Orbits: Globes and orbits are unremarkable. Visualized sinuses and mastoid air cells  are clear. Other: None. IMPRESSION: 1. No acute intracranial abnormalities. 2. Atrophy chronic microvascular ischemic change stable from prior exam. Electronically Signed   By: Amie Portland M.D.   On: 08/24/2016 19:33      Dg Chest Port 1 View  Result Date: 08/25/2016 CLINICAL DATA:  Shortness of breath. History of diabetes, hypertension, and previous CVA. Dialysis dependent renal failure. EXAM: PORTABLE CHEST 1 VIEW COMPARISON:  Chest x-ray of Aug 24, 2016 FINDINGS: The lungs are adequately inflated and clear. The heart is top-normal in size. The pulmonary vascularity is normal. There is calcification in the wall of the aortic arch. There is no pleural effusion or pneumothorax. The observed bony thorax exhibits no acute abnormality. IMPRESSION: There is no active cardiopulmonary disease. Thoracic aortic atherosclerosis. Electronically Signed   By: David  Swaziland M.D.   On: 08/25/2016 07:30   Dg Abd  Portable 1v  Result Date: 08/24/2016 CLINICAL DATA:  Nausea EXAM: PORTABLE ABDOMEN - 1 VIEW COMPARISON:  None. FINDINGS: Gastrostomy catheter is noted in the upper accident. Contrast material is noted within the colon. No obstructive changes are noted. Diffuse vascular calcifications are seen. No bony abnormality is noted. No free air is seen. IMPRESSION: Gastrostomy in place.  No acute abnormality noted. Electronically Signed   By: Alcide Clever M.D.   On: 08/24/2016 21:07    Time Spent in minutes  30   Susa Raring M.D on 08/25/2016 at 9:16 AM  Between 7am to 7pm - Pager - [phone] ( page via [email or web address], text pages only, please mention full 10 digit call back number). After 7pm go to [email or web address] - password [email or web address]

## 2016-08-25 NOTE — Progress Notes (Addendum)
Progress Note  Patient Name: [name] Date of Encounter: 08/25/2016  Primary Cardiologist: Dr. Eden EmmsNIshan  Subjective   Denies any chest pain or SOB  Inpatient Medications    Scheduled Meds: . aspirin  81 mg Per Tube Daily  . atorvastatin  10 mg Per Tube Daily  . dextrose      . heparin subcutaneous  5,000 Units Subcutaneous Q8H  . insulin aspart  0-9 Units Subcutaneous Q6H   Continuous Infusions: . dextrose 50 mL/hr at 08/24/16 2111   PRN Meds: albuterol   Vital Signs    Vitals:   08/24/16 2319 08/25/16 0000 08/25/16 0407 08/25/16 0843  BP: (!) 69/40 133/70 113/62 (!) 142/95  Pulse: 81 80 80 84  Resp: 12 14 (!) 9 18  Temp: 97.5 F (36.4 C)  97.6 F (36.4 C) 97.7 F (36.5 C)  TempSrc: Oral  Oral Oral  SpO2: 100% 100% 100% 100%  Weight:      Height:        Intake/Output Summary (Last 24 hours) at 08/25/16 0854 Last data filed at 08/24/16 2300  Gross per 24 hour  Intake            90.83 ml  Output                0 ml  Net            90.83 ml   Filed Weights   08/24/16 1521 08/24/16 2035  Weight: 115 lb (52.2 kg) 113 lb 1.5 oz (51.3 kg)    Telemetry    NSR - Personally Reviewed  ECG    NSR - Personally Reviewed  Physical Exam   GEN: Thin ill appearing in NAD Neck: No JVD or bruit Cardiac: RRR with no M/R/G  Respiratory: CTA bilaterally GI: soft, NT, ND with active BS MS: left AKA and right BKA Neuro: A&O x 3 Psych: normal mood  Labs    Chemistry Recent Labs Lab 08/24/16 1543 08/25/16 0316  NA 129* 130*  K 3.3* 3.6  CL 95* 95*  CO2 28 25  GLUCOSE 108* 62*  BUN 5* 8  CREATININE 1.73* 2.02*  CALCIUM 8.3* 8.4*  PROT 6.5  --   ALBUMIN 2.5*  --   AST 43*  --   ALT 16  --   ALKPHOS 161*  --   BILITOT 1.1  --   GFRNONAA 33* 27*  GFRAA 38* 31*  ANIONGAP 6 10     Hematology Recent Labs Lab 08/23/16 0928 08/24/16 1540 08/25/16 0316  WBC 7.5 5.9 10.6*  RBC 4.10 4.12 3.96  HGB 12.8 13.1 12.4  HCT 39.3 39.0 37.2  MCV  95.9 94.7 93.9  MCH 31.2 31.8 31.3  MCHC 32.6 33.6 33.3  RDW 17.1* 16.2* 16.0*  PLT 185 171 131*    Cardiac Enzymes Recent Labs Lab 08/24/16 2118 08/25/16 0316  TROPONINI 0.03* 0.03*    Recent Labs Lab 08/24/16 1555 08/24/16 1834  TROPIPOC 0.11* 0.09*     BNPNo results for input(s): BNP, PROBNP in the last 168 hours.   DDimer No results for input(s): DDIMER in the last 168 hours.   Radiology    Dg Chest 2 View  Result Date: 08/24/2016 CLINICAL DATA:  Possible sepsis. EXAM: CHEST  2 VIEW COMPARISON:  Radiographs of June 11, 2015. FINDINGS: The heart size and mediastinal contours are within normal limits. Both lungs are clear. No pneumothorax or pleural effusion is noted. Atherosclerosis of thoracic aorta is noted.  The visualized skeletal structures are unremarkable. IMPRESSION: No active cardiopulmonary disease.  Aortic atherosclerosis. Electronically Signed   By: Lupita Raider, M.D.   On: 08/24/2016 17:24   Ct Head Wo Contrast  Result Date: 08/24/2016 CLINICAL DATA:  Episode of unresponsiveness following dialysis. EXAM: CT HEAD WITHOUT CONTRAST TECHNIQUE: Contiguous axial images were obtained from the base of the skull through the vertex without intravenous contrast. COMPARISON:  06/13/2016 FINDINGS: Brain: No evidence of acute infarction, hemorrhage, hydrocephalus, extra-axial collection or mass lesion/mass effect. The ventricles and sulci are enlarged, greater than expected for patient age, reflecting moderate generalized atrophy. There is also patchy white matter hypoattenuation that is consistent with mild to moderate chronic microvascular ischemic change. Findings are stable from the prior exam. Vascular: No hyperdense vessel or unexpected calcification. Skull: Normal. Negative for fracture or focal lesion. Sinuses/Orbits: Globes and orbits are unremarkable. Visualized sinuses and mastoid air cells are clear. Other: None. IMPRESSION: 1. No acute intracranial abnormalities.  2. Atrophy chronic microvascular ischemic change stable from prior exam. Electronically Signed   By: Amie Portland M.D.   On: 08/24/2016 19:33   Ir Gastrostomy Tube Mod Sed  Result Date: 08/23/2016 INDICATION: 56 year old female with a history of dysphagia EXAM: IMAGE GUIDED GASTROSTOMY TUBE MEDICATIONS: 1.0 g vancomycin; Antibiotics were administered within 1 hour of the procedure. ANESTHESIA/SEDATION: Versed 1.0 mg IV; Fentanyl 50 mcg IV Moderate Sedation Time:  10 minutes The patient was continuously monitored during the procedure by the interventional radiology nurse under my direct supervision. CONTRAST:  10 cc - administered into the gastric lumen. FLUOROSCOPY TIME:  Fluoroscopy Time: 1 minutes 24 seconds (13 mGy). COMPLICATIONS: None PROCEDURE: Informed written consent was obtained from the patient's family after a thorough discussion of the procedural risks, benefits and alternatives. All questions were addressed. Maximal Sterile Barrier Technique was utilized including caps, mask, sterile gowns, sterile gloves, sterile drape, hand hygiene and skin antiseptic. A timeout was performed prior to the initiation of the procedure. The procedure, risks, benefits, and alternatives were explained to the patient. Questions regarding the procedure were encouraged and answered. The patient understands and consents to the procedure. The epigastrium was prepped with Betadine in a sterile fashion, and a sterile drape was applied covering the operative field. A sterile gown and sterile gloves were used for the procedure. A 5-French orogastric tube is placed under fluoroscopic guidance. Scout imaging of the abdomen confirms barium within the transverse colon. The stomach was distended with gas. Under fluoroscopic guidance, an 18 gauge needle was utilized to puncture the anterior wall of the body of the stomach. An Amplatz wire was advanced through the needle passing a T fastener into the lumen of the stomach. The T  fastener was secured for gastropexy. A 9-French sheath was inserted. A snare was advanced through the 9-French sheath. A Teena Dunk was advanced through the orogastric tube. It was snared then pulled out the oral cavity, pulling the snare, as well. The leading edge of the gastrostomy was attached to the snare. It was then pulled down the esophagus and out the percutaneous site. It was secured in place. Contrast was injected. No complication IMPRESSION: Status post percutaneous gastrostomy tube placement. Signed, Yvone Neu. Loreta Ave, DO [facility] Radiology Electronically Signed   By: Gilmer Mor D.O.   On: 08/23/2016 12:07   Dg Chest Port 1 View  Result Date: 08/25/2016 CLINICAL DATA:  Shortness of breath. History of diabetes, hypertension, and previous CVA. Dialysis dependent renal failure. EXAM: PORTABLE  CHEST 1 VIEW COMPARISON:  Chest x-ray of Aug 24, 2016 FINDINGS: The lungs are adequately inflated and clear. The heart is top-normal in size. The pulmonary vascularity is normal. There is calcification in the wall of the aortic arch. There is no pleural effusion or pneumothorax. The observed bony thorax exhibits no acute abnormality. IMPRESSION: There is no active cardiopulmonary disease. Thoracic aortic atherosclerosis. Electronically Signed   By: David  [place] M.D.   On: 08/25/2016 07:30   Dg Abd Portable 1v  Result Date: 08/24/2016 CLINICAL DATA:  Nausea EXAM: PORTABLE ABDOMEN - 1 VIEW COMPARISON:  None. FINDINGS: Gastrostomy catheter is noted in the upper accident. Contrast material is noted within the colon. No obstructive changes are noted. Diffuse vascular calcifications are seen. No bony abnormality is noted. No free air is seen. IMPRESSION: Gastrostomy in place.  No acute abnormality noted. Electronically Signed   By: Alcide Clever M.D.   On: 08/24/2016 21:07    Cardiac Studies   none  Patient Profile     57 y.o. female with a history of stroke,  dysphagia, recent PEG tube placement, ESRD on Monday, Wednesday and Friday dialysis under Dr. 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 Her PEG tube is still not under use, she went to dialysis and became unresponsive thereafter was brought to the ER. According to the ER she was about 3/4 of the way through became confused and was noted to be hypotensive and hypothermic. She was transported to the ED where temp was 94.60F and bear hugger was placed.  She denies any fever, N/V, chest pain, SOB, DOE, diarrhea, cough, dysuria.  She has had some chills.  She was noted to have an elevated POC trop of 0.11 and abnormal EKG and Cardiology is now asked to consult.    Assessment & Plan    1.  Elevated troponin in the setting of ESRD and hypotension.   - POC trop elevated at 0.09 but Trop I barely elevated at 0.03 x 2 and consistent with ESRD - She denies any chest pain but has significant PVD and DM so likely has underlying CAD.  Her EKG is markedly abnormal but this has been present in the past but not to the same degree. She was hypothermic and hypotension on admission and hypothermia can cause ST abnormalities.   - Check 2D echo to assess LVF.  - Continue statin.  - Continue ASA.  - no BB due to recent hypotension with HD - She is DNR and poor quality of life living in SNF with multiple cormorbidites and poor PO intake now with PEG tube in place.  She is not a good candidate for aggressive invasive procedures so would not pursue stress testing and treat medically.   2.  Prolonged QTc in setting of electrolyte abnormalites including hypokalemia and hyponatremia and hypothermia.   -  stopmeclizine, zofran, venlafaxine and phenergan.   - Mag and potassium now in normal range - repeat EKG to reassess QT  3.  PVD s/p bilateral LE amputations and LUE amputation.   4.  Elevated TSH - per [facility]  5.  Failure to thrive - apparently she has not been eating or drinking well at the SNF.  Per [facility]  No other recs at this time.    Not candidate for invasive cardiac procedures. Will sign off. Call with questions.   Signed, Armanda Magic, MD  08/25/2016, 8:54 AM

## 2016-08-25 NOTE — Progress Notes (Signed)
Pharmacy Antibiotic Note  [name] is a 56 y.o. female admitted on 08/24/2016 with loss of consciousness.  Now with Pseudomonas growing in blood culture and [facility].  Noted report of hives when taking penicillin and Benadryl will be ordered with [name] per MD.  She is afebrile and her WBC is mildly elevated.  Patient continues on MWF dialysis.   Plan: - Fortaz 2gm IV q-HD MWF - Benadryl 50mg  IV daily PRN for possible reaction to Fortaz per Dr. Thedore MinsSingh - Monitor HD schedule/tolerance, micro data   Height: 5' (152.4 cm) Weight: 113 lb 1.5 oz (51.3 kg) IBW/kg (Calculated) : 45.5  Temp (24hrs), Avg:97.1 F (36.2 C), Min:94.7 F (34.8 C), Max:97.7 F (36.5 C)   Recent Labs Lab 08/23/16 0928 08/24/16 1540 08/24/16 1543 08/24/16 1757 08/25/16 0316  WBC 7.5 5.9  --   --  10.6*  CREATININE  --   --  1.73*  --  2.02*  LATICACIDVEN  --   --   --  1.37  --     Estimated Creatinine Clearance: 23.1 mL/min (A) (by C-G formula based on SCr of 2.02 mg/dL (H)).    Allergies  Allergen Reactions  . Codeine Hives  . Levofloxacin Other (See Comments)    Unknown, but noted on MAR  . Penicillins Hives    Has patient had a PCN reaction causing immediate rash, facial/tongue/throat swelling, SOB or lightheadedness with hypotension: Yes Has patient had a PCN reaction causing severe rash involving mucus membranes or skin necrosis: Unk Has patient had a PCN reaction that required hospitalization: Unk Has patient had a PCN reaction occurring within the last 10 years: Unk If all of the above answers are "NO", then may proceed with Cephalosporin use.      [name] 5/3 >>  5/2 MRSA PCR - negative 5/2 BCx x2 - GNR 1 of 2 (BCID Pseudomonas)    [name] [name], PharmD, BCPS Pager:  [phone] 08/25/2016, 4:16 PM

## 2016-08-25 NOTE — Progress Notes (Signed)
CM Referral for electric wheelchair:  Case manager unable to obtain electric wheelchair for patient; this must be done through PCP.  [facility]  Will contact facility CSW or case manager to see if they can facilitate this for pt.  Notified pt that we would be contacting her facility regarding this, and she is agreeable with plan.    Quintella BatonJulie W. Zamira Hickam, RN, BSN  Trauma/Neuro ICU Case Manager [phone]

## 2016-08-25 NOTE — Consult Note (Addendum)
[facility] Renal Consultation Note    Indication for Consultation:  Management of ESRD/hemodialysis; anemia, hypertension/volume and secondary hyperparathyroidism PCP:  Nephrologist: Dr. Arrie Aranoladonato  HPI: [name] is a 56 y.o. female with ESRD on hemodialysis MWF at [facility]. PMH of DM, severe PVD, dementia, PEA arrest,  H/O R AKA, H/O L BKA, H/O L forearm amputation. Patient's husband died in 05/2016. She has been progressively physically declining since his death. Patient had stopped eating after death of husband, recently had PEG tube placed. [facility] Sister-in-law has been making decisions for patient since death of husband.   Per per report from dialysis center RN, [facility] and [facility]. Upon arrival to HD unit, BS was 60 but patient was alert and talking. She rec'd d50W in HD unit and continued treatment.Later, toward end of treatment, patient became progressively unresponsive with incontinence of stool-liquid diarrhea. She was transported to ED from HD unit for evaluation. BS on arrival to ED was 108 K+3.3 (draw immediately post HD) WBC 8.2 HGB 14.2 Lactic acid WNL, CT of head without acute changes, CXR unremarkable. KUB showed gastrostomy tube in place but no acute abnormalities.   Patient is awake, alert oriented to place and self, addresses me by name. Appears at baseline. Says she has no idea why they brought her to hospital. She denies fever, chills, N,V,D, chest pain, SOB, abdominal pain, CVAT. Patient is very poor historian and rest of history was obtained from nurses at HD center and EMR.    Past Medical History:  Diagnosis Date  . Anemia   . Blood transfusion   . Chronic back pain   . Dementia   . Diabetes mellitus   . Hemodialysis patient (HCC) 03/15/11   "Tues; Thurs; Sat; [facility]"  . Hypertension   . PEA (Pulseless  electrical activity) (HCC)   . Peripheral vascular disease (HCC)   . Rash 03/15/11   "admitted me to find out what its' from; UE, waist, thighs, groin"  . Renal failure    hd 4/12  . Stroke [facility](HCC)    Past Surgical History:  Procedure Laterality Date  . AMPUTATION  03/04/2011   Procedure: AMPUTATION DIGIT;  Surgeon: Nadara MustardMarcus V Duda, MD;  [facility];  Service: Orthopedics;  Laterality: Right;  RT GREAT TOE AMP  . AMPUTATION Right 03/13/2015   Procedure: RIGHT  ABOVE KNEE AMPUTATION;  Surgeon: Pryor OchoaJames D Lawson, MD;  [facility];  Service: Vascular;  Laterality: Right;  . ARTERIOVENOUS GRAFT PLACEMENT  10/01/10   left upper arm  . BELOW KNEE LEG AMPUTATION  2004   left  . CARPAL TUNNEL RELEASE  ~ 2009   left wrist  . CATARACT EXTRACTION W/ INTRAOCULAR LENS IMPLANT  12/2009   right eye  . HAND AMPUTATION Left   . INSERTION OF DIALYSIS CATHETER  07/29/10   right subclavian  . IR FLUORO RM 30-60 MIN  08/16/2016  . IR GASTROSTOMY TUBE MOD SED  08/23/2016  . TEE WITHOUT CARDIOVERSION  03/18/2011   Procedure: TRANSESOPHAGEAL ECHOCARDIOGRAM (TEE);  Surgeon: Marca Anconaalton McLean, MD;  [facility];  Service: Cardiovascular;  Laterality: N/A;   Family History  Problem Relation Age of Onset  . Diabetes Mellitus II Mother   . Stroke Mother    Social History:  reports that she has never smoked. She has never used smokeless tobacco. She reports that she  does not drink alcohol or use drugs. Allergies  Allergen Reactions  . Codeine Hives  . Levofloxacin Other (See Comments)    Unknown, but noted on MAR  . Penicillins Hives    Has patient had a PCN reaction causing immediate rash, facial/tongue/throat swelling, SOB or lightheadedness with hypotension: Yes Has patient had a PCN reaction causing severe rash involving mucus membranes or skin necrosis: Unk Has patient had a PCN reaction that required hospitalization: Unk Has patient had a PCN reaction occurring within the last 10 years: Unk If  all of the above answers are "NO", then may proceed with Cephalosporin use.    Prior to Admission medications   Medication Sig Start Date End Date Taking? Authorizing Provider  antiseptic oral rinse (BIOTENE) LIQD 15 mLs by Mouth Rinse route 3 (three) times daily after meals. After meals   Yes Historical Provider, MD  atorvastatin (LIPITOR) 10 MG tablet Take 10 mg by mouth daily.   Yes Historical Provider, MD  glucagon (GLUCAGON EMERGENCY) 1 MG injection Inject 1 mg into the muscle once as needed (type 2 diabetes).    Yes Historical Provider, MD  guaiFENesin-codeine (ROBITUSSIN AC) 100-10 MG/5ML syrup Take 30 mLs by mouth every 6 (six) hours as needed for cough.   Yes Historical Provider, MD  HYDROcodone-acetaminophen (NORCO/VICODIN) 5-325 MG tablet Take 1-2 tablets by mouth every 6 (six) hours as needed for moderate pain. Patient taking differently: Take 1-2 tablets by mouth See admin instructions. EVERY 6 HOURS AS NEEDED (1 TABLET FOR MILD PAIN AND 2 TABLETS FOR MODERATE PAIN) 06/15/15  Yes Clydia Llano, MD  hydrocortisone cream 1 % Apply 1 application topically every 8 (eight) hours as needed for itching (apply to back as needed).   Yes Historical Provider, MD  meclizine (ANTIVERT) 25 MG tablet Take 25 mg by mouth 3 (three) times daily as needed for dizziness.   Yes Historical Provider, MD  mirtazapine (REMERON) 30 MG tablet Take 30 mg by mouth at bedtime.   Yes Historical Provider, MD  NON FORMULARY Nepro TF: 20 ml's/hr and advance by 10 ml's every twelve hours until the goal of 40 ml's/hr is reached   Yes Historical Provider, MD  nystatin ointment (MYCOSTATIN) Apply 1 application topically 3 (three) times daily after meals. Apply to corners of mouth topically after meals for dry mouth   Yes Historical Provider, MD  ondansetron (ZOFRAN) 4 MG tablet Take 4 mg by mouth every 8 (eight) hours as needed for nausea or vomiting.    Yes Historical Provider, MD  pantoprazole (PROTONIX) 40 MG tablet Take  40 mg by mouth daily.    Yes Historical Provider, MD  promethazine (PHENERGAN) 12.5 MG tablet Take 12.5-25 mg by mouth every 6 (six) hours as needed for nausea or vomiting.    Yes Historical Provider, MD  senna-docusate (SENOKOT-S) 8.6-50 MG tablet Take 2 tablets by mouth at bedtime.    Yes Historical Provider, MD  traMADol (ULTRAM) 50 MG tablet Take 1 tablet (50 mg total) by mouth every 12 (twelve) hours as needed for moderate pain. 06/15/15  Yes Clydia Llano, MD  UNABLE TO FIND Enteral Food Order: 150 ml's via NG tube four times a day   Yes Historical Provider, MD  Venlafaxine HCl 225 MG TB24 Take 225 mg by mouth daily with breakfast.   Yes Historical Provider, MD  guaiFENesin (ROBITUSSIN) 100 MG/5ML SOLN Take 20 mLs by mouth every 8 (eight) hours.    Historical Provider, MD  LORazepam (ATIVAN) 2 MG/ML concentrated  solution Take 1 mg by mouth once. ONLY BEFORE DENTAL PROCEDURES    Historical Provider, MD   Current Facility-Administered Medications  Medication Dose Route Frequency Provider Last Rate Last Dose  . albuterol (PROVENTIL) (2.5 MG/3ML) 0.083% nebulizer solution 2.5 mg  2.5 mg Nebulization Q4H PRN Leroy Sea, MD      . aspirin chewable tablet 81 mg  81 mg Per Tube Daily Leroy Sea, MD   81 mg at 08/25/16 1914  . atorvastatin (LIPITOR) tablet 10 mg  10 mg Per Tube Daily Leroy Sea, MD   10 mg at 08/25/16 0820  . dextrose 5 % solution   Intravenous Continuous Leroy Sea, MD      . dextrose 50 % solution           . heparin injection 5,000 Units  5,000 Units Subcutaneous Q8H Leroy Sea, MD   5,000 Units at 08/25/16 0610  . insulin aspart (novoLOG) injection 0-9 Units  0-9 Units Subcutaneous Q6H Leroy Sea, MD      . levothyroxine (SYNTHROID, LEVOTHROID) tablet 25 mcg  25 mcg Per Tube QAC breakfast Leroy Sea, MD       Labs: Basic Metabolic Panel:  Recent Labs Lab 08/24/16 1543 08/25/16 0316  NA 129* 130*  K 3.3* 3.6  CL 95* 95*  CO2 28 25   GLUCOSE 108* 62*  BUN 5* 8  CREATININE 1.73* 2.02*  CALCIUM 8.3* 8.4*   Liver Function Tests:  Recent Labs Lab 08/24/16 1543  AST 43*  ALT 16  ALKPHOS 161*  BILITOT 1.1  PROT 6.5  ALBUMIN 2.5*   CBC:  Recent Labs Lab 08/23/16 0928 08/24/16 1540 08/25/16 0316  WBC 7.5 5.9 10.6*  HGB 12.8 13.1 12.4  HCT 39.3 39.0 37.2  MCV 95.9 94.7 93.9  PLT 185 171 131*   Cardiac Enzymes:  Recent Labs Lab 08/24/16 2118 08/25/16 0316 08/25/16 0912  TROPONINI 0.03* 0.03* <0.03   CBG:  Recent Labs Lab 08/23/16 0949 08/24/16 1547 08/24/16 2116 08/25/16 0409 08/25/16 0516  GLUCAP 102* 89 61* 61* 132*   Dg Chest 2 View  Result Date: 08/24/2016 CLINICAL DATA:  Possible sepsis. EXAM: CHEST  2 VIEW COMPARISON:  Radiographs of June 11, 2015. FINDINGS: The heart size and mediastinal contours are within normal limits. Both lungs are clear. No pneumothorax or pleural effusion is noted. Atherosclerosis of thoracic aorta is noted. The visualized skeletal structures are unremarkable. IMPRESSION: No active cardiopulmonary disease.  Aortic atherosclerosis. Electronically Signed   By: Lupita Raider, M.D.   On: 08/24/2016 17:24   Ct Head Wo Contrast  Result Date: 08/24/2016 CLINICAL DATA:  Episode of unresponsiveness following dialysis. EXAM: CT HEAD WITHOUT CONTRAST TECHNIQUE: Contiguous axial images were obtained from the base of the skull through the vertex without intravenous contrast. COMPARISON:  06/13/2016 FINDINGS: Brain: No evidence of acute infarction, hemorrhage, hydrocephalus, extra-axial collection or mass lesion/mass effect. The ventricles and sulci are enlarged, greater than expected for patient age, reflecting moderate generalized atrophy. There is also patchy white matter hypoattenuation that is consistent with mild to moderate chronic microvascular ischemic change. Findings are stable from the prior exam. Vascular: No hyperdense vessel or unexpected calcification. Skull:  Normal. Negative for fracture or focal lesion. Sinuses/Orbits: Globes and orbits are unremarkable. Visualized sinuses and mastoid air cells are clear. Other: None. IMPRESSION: 1. No acute intracranial abnormalities. 2. Atrophy chronic microvascular ischemic change stable from prior exam. Electronically Signed   By: Onalee Hua  Ormond M.D.   On: 08/24/2016 19:33   Ir Gastrostomy Tube Mod Sed  Result Date: 08/23/2016 INDICATION: 56 year old female with a history of dysphagia EXAM: IMAGE GUIDED GASTROSTOMY TUBE MEDICATIONS: 1.0 g vancomycin; Antibiotics were administered within 1 hour of the procedure. ANESTHESIA/SEDATION: Versed 1.0 mg IV; Fentanyl 50 mcg IV Moderate Sedation Time:  10 minutes The patient was continuously monitored during the procedure by the interventional radiology nurse under my direct supervision. CONTRAST:  10 cc - administered into the gastric lumen. FLUOROSCOPY TIME:  Fluoroscopy Time: 1 minutes 24 seconds (13 mGy). COMPLICATIONS: None PROCEDURE: Informed written consent was obtained from the patient's family after a thorough discussion of the procedural risks, benefits and alternatives. All questions were addressed. Maximal Sterile Barrier Technique was utilized including caps, mask, sterile gowns, sterile gloves, sterile drape, hand hygiene and skin antiseptic. A timeout was performed prior to the initiation of the procedure. The procedure, risks, benefits, and alternatives were explained to the patient. Questions regarding the procedure were encouraged and answered. The patient understands and consents to the procedure. The epigastrium was prepped with Betadine in a sterile fashion, and a sterile drape was applied covering the operative field. A sterile gown and sterile gloves were used for the procedure. A 5-French orogastric tube is placed under fluoroscopic guidance. Scout imaging of the abdomen confirms barium within the transverse colon. The stomach was distended with gas. Under  fluoroscopic guidance, an 18 gauge needle was utilized to puncture the anterior wall of the body of the stomach. An Amplatz wire was advanced through the needle passing a T fastener into the lumen of the stomach. The T fastener was secured for gastropexy. A 9-French sheath was inserted. A snare was advanced through the 9-French sheath. A Teena Dunk was advanced through the orogastric tube. It was snared then pulled out the oral cavity, pulling the snare, as well. The leading edge of the gastrostomy was attached to the snare. It was then pulled down the esophagus and out the percutaneous site. It was secured in place. Contrast was injected. No complication IMPRESSION: Status post percutaneous gastrostomy tube placement. Signed, Yvone Neu. Loreta Ave, DO [facility] Electronically Signed   By: Gilmer Mor D.O.   On: 08/23/2016 12:07   Dg Chest Port 1 View  Result Date: 08/25/2016 CLINICAL DATA:  Shortness of breath. History of diabetes, hypertension, and previous CVA. Dialysis dependent renal failure. EXAM: PORTABLE CHEST 1 VIEW COMPARISON:  Chest x-ray of Aug 24, 2016 FINDINGS: The lungs are adequately inflated and clear. The heart is top-normal in size. The pulmonary vascularity is normal. There is calcification in the wall of the aortic arch. There is no pleural effusion or pneumothorax. The observed bony thorax exhibits no acute abnormality. IMPRESSION: There is no active cardiopulmonary disease. Thoracic aortic atherosclerosis. Electronically Signed   By: David  [place] M.D.   On: 08/25/2016 07:30   Dg Abd Portable 1v  Result Date: 08/24/2016 CLINICAL DATA:  Nausea EXAM: PORTABLE ABDOMEN - 1 VIEW COMPARISON:  None. FINDINGS: Gastrostomy catheter is noted in the upper accident. Contrast material is noted within the colon. No obstructive changes are noted. Diffuse vascular calcifications are seen. No bony abnormality is noted. No free air is seen. IMPRESSION:  Gastrostomy in place.  No acute abnormality noted. Electronically Signed   By: Alcide Clever M.D.   On: 08/24/2016 21:07    ROS: As per HPI otherwise negative.   Physical Exam: Vitals:   08/25/16 0000 08/25/16 0407 08/25/16 1610  08/25/16 1100  BP: 133/70 113/62 (!) 142/95 (!) 105/52  Pulse: 80 80 84 80  Resp: 14 (!) 9 18 18   Temp:  97.6 F (36.4 C) 97.7 F (36.5 C)   TempSrc:  Oral Oral   SpO2: 100% 100% 100% 100%  Weight:      Height:         General: Chronically Ill appearing pt, pleasant, NAD Head: Normocephalic, atraumatic, sclera non-icteric, mucus membranes are moist Neck: Supple. JVD not elevated. Lungs: Clear bilaterally to auscultation without wheezes, rales, or rhonchi. Breathing is unlabored. Heart: RRR with S1 S2. No murmurs, rubs, or gallops appreciated. Abdomen: Soft, non-tender, non-distended with normoactive bowel sounds. No rebound/guarding. No obvious abdominal masses. Peg LUQ abd clamped. drsg CDI. M-S:  Strength and tone appear normal for age. Amputation L hand Lower extremities: R AKA L BKA No stump edema.  Neuro: Alert and oriented X 3. Moves all extremities spontaneously. Psych:  Responds to questions appropriately with a normal affect. Dialysis Access: LUA AVF + bruit  Dialysis Orders: [facility] MWF 4 hrs 180 NRe 49 kg 400/auto 1.5 UF Profile 4 3.0 K/2.25 Ca  Heparin 2200 units IV TIW  Mircera 30 mcg IV q 2 weeks (last HGB 11.5 Ferritin 3267 Fe 44 Tsat 38% 08/17/16)  No binders/VDRA (Ca 9.3 C Ca 10.3 Phos 2.3 PTH 88 08/17/16)  Assessment/Plan: 1.  Hypoglycemia: continues on d5w at 50 cc/hr. Last CBG 132. Per primary.  2.  ESRD -  MWF. HD tomorrow on schedule 3.  Hypertension/volume  -BP controlled. Have been lowering EDW in HD unit D/T wt loss. Left 1 kg above EDW last tx but left early. Attempt EDW 49 kg tomorrow.  4.  Anemia  - HGB 13.1 No ESA needed 5.  Metabolic bone disease - No binders/VDRA 6.  Nutrition - Albumin 2.5. Severe PCM. Swallowing  study done-now on DYS 3 diet.  7. FTT: needs palliative care consult for goals of care.   Rita H. Manson Passey, NP-C 08/25/2016, 11:24 AM  [facility] Beeper [phone]  Pt seen, examined and agree w A/P as above. ESRD pt with multiple amputation admitted for low BS.  Had PEG tube placed for not eating.  Is due for HD tomorrow.  No new suggestions, plan HD tomorrow.   Vinson Moselle MD [employer] pager [phone]   08/25/2016, 2:07 PM

## 2016-08-25 NOTE — Progress Notes (Signed)
Hypoglycemic Event  CBG: 67  Treatment: 1 tube instant glucose  Symptoms: None  Follow-up CBG: Time: 22:05 CBG Result:78  Possible Reasons for Event: Inadequate meal intake  Comments/MD notified:Kirby,NP    Thang Flett Joselita

## 2016-08-26 ENCOUNTER — Inpatient Hospital Stay (HOSPITAL_COMMUNITY): Payer: Medicare Other

## 2016-08-26 DIAGNOSIS — I340 Nonrheumatic mitral (valve) insufficiency: Secondary | ICD-10-CM

## 2016-08-26 LAB — BASIC METABOLIC PANEL
ANION GAP: 8 (ref 5–15)
Anion gap: 9 (ref 5–15)
BUN: 12 mg/dL (ref 6–20)
BUN: 15 mg/dL (ref 6–20)
CALCIUM: 8.3 mg/dL — AB (ref 8.9–10.3)
CHLORIDE: 81 mmol/L — AB (ref 101–111)
CHLORIDE: 91 mmol/L — AB (ref 101–111)
CO2: 22 mmol/L (ref 22–32)
CO2: 24 mmol/L (ref 22–32)
CREATININE: 2.87 mg/dL — AB (ref 0.44–1.00)
Calcium: 7.4 mg/dL — ABNORMAL LOW (ref 8.9–10.3)
Creatinine, Ser: 2.53 mg/dL — ABNORMAL HIGH (ref 0.44–1.00)
GFR calc Af Amer: 20 mL/min — ABNORMAL LOW (ref >60)
GFR calc non Af Amer: 21 mL/min — ABNORMAL LOW (ref >60)
GFR calc non Af Amer: 18 mL/min — ABNORMAL LOW (ref >60)
GFR, EST AFRICAN AMERICAN: 24 mL/min — AB (ref >60)
GLUCOSE: 581 mg/dL — AB (ref 65–99)
Glucose, Bld: 73 mg/dL (ref 65–99)
POTASSIUM: 3 mmol/L — AB (ref 3.5–5.1)
Potassium: 3.8 mmol/L (ref 3.5–5.1)
SODIUM: 124 mmol/L — AB (ref 135–145)
Sodium: 111 mmol/L — CL (ref 135–145)

## 2016-08-26 LAB — CBC
HEMATOCRIT: 31.7 % — AB (ref 36.0–46.0)
HEMOGLOBIN: 10.4 g/dL — AB (ref 12.0–15.0)
MCH: 30.6 pg (ref 26.0–34.0)
MCHC: 32.8 g/dL (ref 30.0–36.0)
MCV: 93.2 fL (ref 78.0–100.0)
Platelets: 126 10*3/uL — ABNORMAL LOW (ref 150–400)
RBC: 3.4 MIL/uL — AB (ref 3.87–5.11)
RDW: 15.9 % — ABNORMAL HIGH (ref 11.5–15.5)
WBC: 7.5 10*3/uL (ref 4.0–10.5)

## 2016-08-26 LAB — GLUCOSE, CAPILLARY
GLUCOSE-CAPILLARY: 83 mg/dL (ref 65–99)
GLUCOSE-CAPILLARY: 97 mg/dL (ref 65–99)
Glucose-Capillary: 59 mg/dL — ABNORMAL LOW (ref 65–99)
Glucose-Capillary: 71 mg/dL (ref 65–99)
Glucose-Capillary: 75 mg/dL (ref 65–99)
Glucose-Capillary: 76 mg/dL (ref 65–99)
Glucose-Capillary: 79 mg/dL (ref 65–99)
Glucose-Capillary: 95 mg/dL (ref 65–99)

## 2016-08-26 LAB — ECHOCARDIOGRAM COMPLETE
Height: 64 in
Weight: 1834.23 oz

## 2016-08-26 LAB — MAGNESIUM
Magnesium: 1.7 mg/dL (ref 1.7–2.4)
Magnesium: 1.7 mg/dL (ref 1.7–2.4)

## 2016-08-26 LAB — PHOSPHORUS
PHOSPHORUS: 2.1 mg/dL — AB (ref 2.5–4.6)
PHOSPHORUS: 1.6 mg/dL — AB (ref 2.5–4.6)

## 2016-08-26 LAB — HEMOGLOBIN A1C: Hgb A1c MFr Bld: <4.2 % — ABNORMAL LOW (ref 4.8–5.6)

## 2016-08-26 MED ORDER — LIDOCAINE-PRILOCAINE 2.5-2.5 % EX CREA: 1.0000 "application " | TOPICAL_CREAM | CUTANEOUS | Status: DC | PRN

## 2016-08-26 MED ORDER — INSULIN DETEMIR 100 UNIT/ML ~~LOC~~ SOLN
10.0000 [IU] | Freq: Every day | SUBCUTANEOUS | Status: DC
  Filled 2016-08-26: qty 0.1

## 2016-08-26 MED ORDER — LIDOCAINE HCL (PF) 1 % IJ SOLN: 5.0000 mL | INTRAMUSCULAR | Status: DC | PRN

## 2016-08-26 MED ORDER — GLUCERNA SHAKE PO LIQD
237.0000 mL | Freq: Three times a day (TID) | ORAL | Status: DC
  Administered 2016-08-26: 237 mL

## 2016-08-26 MED ORDER — FREE WATER
100.0000 mL | Freq: Three times a day (TID) | Status: DC
Start: 2016-08-26 — End: 2016-08-27
  Administered 2016-08-26 – 2016-08-27 (×4): 100 mL

## 2016-08-26 MED ORDER — NEPRO/CARBSTEADY PO LIQD: 360.0000 mL | Freq: Every day | ORAL | Status: DC

## 2016-08-26 MED ORDER — INSULIN ASPART 100 UNIT/ML ~~LOC~~ SOLN: 0.0000 [IU] | SUBCUTANEOUS | Status: DC

## 2016-08-26 MED ORDER — NEPRO/CARBSTEADY PO LIQD
237.0000 mL | Freq: Two times a day (BID) | ORAL | Status: DC
  Administered 2016-08-27 (×2): 237 mL

## 2016-08-26 MED ORDER — INSULIN ASPART 100 UNIT/ML ~~LOC~~ SOLN: 0.0000 [IU] | Freq: Four times a day (QID) | SUBCUTANEOUS | Status: DC

## 2016-08-26 MED ORDER — DEXTROSE 5 % IV SOLN
2.0000 g | INTRAVENOUS | Status: DC
  Administered 2016-08-26 (×2): 2 g via INTRAVENOUS
  Filled 2016-08-26: qty 2

## 2016-08-26 MED ORDER — SODIUM CHLORIDE 0.9 % IV SOLN: 100.0000 mL | INTRAVENOUS | Status: DC | PRN

## 2016-08-26 MED ORDER — GLUCOSE 40 % PO GEL
ORAL | Status: AC
  Administered 2016-08-26: 37.5 g
  Filled 2016-08-26: qty 1

## 2016-08-26 MED ORDER — GLUCERNA SHAKE PO LIQD: 237.0000 mL | Freq: Three times a day (TID) | ORAL | Status: DC

## 2016-08-26 MED ORDER — PENTAFLUOROPROP-TETRAFLUOROETH EX AERO: 1.0000 "application " | INHALATION_SPRAY | CUTANEOUS | Status: DC | PRN

## 2016-08-26 NOTE — Progress Notes (Addendum)
PROGRESS NOTE                                                                                                                                                                                                             Patient Demographics:    [name], is a 56 y.o. female, DOB - Aug 31, 1960, ZOX:[id number]  Admit date - 08/24/2016   Admitting Physician Leroy Sea, MD  Outpatient Primary MD for the patient is Irena Cords, MD  LOS - 2  Chief Complaint  Patient presents with  . Loss of Consciousness       Brief Narrative   [name]  is a 56 y.o. female, With history of stroke, dysphagia, recent PEG tube placement, ESRD on Monday, Wednesday and Friday dialysis under Dr. 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 Her PEG tube is still not under use, she went to dialysis unit today and became unresponsive thereafter was brought to the ER   Subjective:   Patient in bed, appears comfortable, denies any headache, no fever, no chest pain or pressure, no shortness of breath , no abdominal pain. No focal weakness.   Assessment  & Plan :    1. Encephalopathy. Unclear etiology likely from combination of Hypotension post dialysis and Hypoglycemia from poor Oral intake, CT head -ve, is now at baseline post D5W drip and now stable BP and CBG, Although blood cultures have come back Pseudomonas positive I do not think she was septic at the time of admission Pseudomonas could have been a skin contamination however we will go ahead and treat her for a short course as instructed by ID.  2. DM type II with  frequent hypoglycemia due to poor oral intake. Currently on sliding scale, monitor CBGs.  Lab Results  Component Value Date   HGBA1C 6.0 (H) 06/11/2015   CBG (last 3)   Recent Labs  08/26/16 0346 08/26/16 0527 08/26/16 0732  GLUCAP 97 76 75    3. Dysphagia and stroke with Moderate PCM. PEG tube placed 08/23/2016, IR  cleared for PEG tube feeds on 08/26/2016, PEG tube feeds and free water flushes started, dietitian consulted, also cleared by speech and currently on dysphagia 3 diet.  4. History of stroke, left BKA, right AKA, left arm amputation. Supportive care. SNF discharge once better. Continue statin and aspirin via PEG tube.  5. ESRD M, W, F dialysis schedule. Renal on board due for dialysis today.  6. Anxiety. On daily Ativan continue.  7.Non specific EKG changes - pain free, Trop stable, on ASA for now, Cards on board.  8.High Qtc - Home venlafaxine stopped, repeat EKG shows much improved QTC of 498 ms.    9. Hypothyroidism - placed on Synthroid (with hypoglycemia, poor apetite, mild hypothermia).  10. Pseudomonas bacteremia could be contamination however cannot be overlooked, no clear source, discussed with ID physician Dr. Orvan Falconerampbell continue [name] total of 7 to ten-day treatment. Can switch to oral depending on sensitivities.   Diet : DIET DYS 3 Room service appropriate? Yes; Fluid consistency: Thin    Family Communication  :  POA  Code Status :  DNR  Disposition Plan  :  SNF in am  Consults  :  Renal, Cards, ID Dr Orvan Falconerampbell over the phone  Procedures  :    CT head - -ve  DVT Prophylaxis  :   Heparin   Lab Results  Component Value Date   PLT 126 (L) 08/26/2016    Inpatient Medications  Scheduled Meds: . aspirin  81 mg Per Tube Daily  . atorvastatin  10 mg Per Tube Daily  . feeding supplement (GLUCERNA SHAKE)  237 mL Per Tube TID BM  . free water  100 mL Per Tube Q8H  . heparin subcutaneous  5,000 Units Subcutaneous Q8H  . insulin aspart  0-15  Units Subcutaneous Q4H  . levothyroxine  25 mcg Per Tube QAC breakfast   Continuous Infusions: . cefTAZidime (FORTAZ)  IV     PRN Meds:.albuterol, diphenhydrAMINE  Antibiotics  :    Anti-infectives    Start     Dose/Rate Route Frequency Ordered Stop   08/26/16 1200  cefTAZidime (FORTAZ) 2 g in dextrose 5 % 50 mL IVPB     2 g 100 mL/hr over 30 Minutes Intravenous Every M-W-F (Hemodialysis) 08/25/16 1609     08/25/16 1615  cefTAZidime (FORTAZ) 2 g in dextrose 5 % 50 mL IVPB     2 g 100 mL/hr over 30 Minutes Intravenous  Once 08/25/16 1609 08/25/16 1655         Objective:   Vitals:   08/25/16 1636 08/25/16 1700 08/25/16 1959 08/26/16 0619  BP:  113/90 (!) 122/54 127/65  Pulse:  84 82 82  Resp:   15 15  Temp:  98.3 F (36.8 C) 98.5 F (36.9 C) 98.3 F (36.8 C)  TempSrc:  Oral Oral Oral  SpO2:  100% 100% 100%  Weight:   52 kg (114 lb 10.2 oz)   Height: 5\' 4"  (1.626 m)       Wt Readings from Last 3 Encounters:  08/25/16 52 kg (114 lb 10.2 oz)  08/23/16 52.9 kg (116 lb 10 oz)  08/16/16 59 kg (130 lb)     Intake/Output Summary (Last 24 hours) at 08/26/16 0943 Last data filed at 08/26/16 0600  Gross per 24 hour  Intake             1150 ml  Output                0  ml  Net             1150 ml     Physical Exam  Awake Alert, Oriented X 3, No new F.N deficits, Normal affect [place].AT,PERRAL Supple Neck,No JVD, No cervical lymphadenopathy appriciated.  Symmetrical Chest wall movement, Good air movement bilaterally, CTAB RRR,No Gallops,Rubs or new Murmurs, No Parasternal Heave +ve B.Sounds, Abd Soft, No tenderness, No organomegaly appriciated, No rebound - guarding or rigidity. No Cyanosis, Clubbing or edema, multiple scabs  R BKA, L AKA, L Arm amputated     Data Review:    CBC  Recent Labs Lab 08/23/16 0928 08/24/16 1540 08/25/16 0316 08/26/16 0405  WBC 7.5 5.9 10.6* 7.5  HGB 12.8 13.1 12.4 10.4*  HCT 39.3 39.0 37.2 31.7*  PLT 185 171 131* 126*  MCV  95.9 94.7 93.9 93.2  MCH 31.2 31.8 31.3 30.6  MCHC 32.6 33.6 33.3 32.8  RDW 17.1* 16.2* 16.0* 15.9*    Chemistries   Recent Labs Lab 08/24/16 1543 08/25/16 0316 08/26/16 0405 08/26/16 0647  NA 129* 130* 111* 124*  K 3.3* 3.6 3.0* 3.8  CL 95* 95* 81* 91*  CO2 28 25 22 24   GLUCOSE 108* 62* 581* 73  BUN 5* 8 12 15   CREATININE 1.73* 2.02* 2.53* 2.87*  CALCIUM 8.3* 8.4* 7.4* 8.3*  MG 2.0  --   --   --   AST 43*  --   --   --   ALT 16  --   --   --   ALKPHOS 161*  --   --   --   BILITOT 1.1  --   --   --    ------------------------------------------------------------------------------------------------------------------ No results for input(s): CHOL, HDL, LDLCALC, TRIG, CHOLHDL, LDLDIRECT in the last 72 hours.  Lab Results  Component Value Date   HGBA1C 6.0 (H) 06/11/2015   ------------------------------------------------------------------------------------------------------------------  Recent Labs  08/24/16 1737  TSH 10.066*   ------------------------------------------------------------------------------------------------------------------ No results for input(s): VITAMINB12, FOLATE, FERRITIN, TIBC, IRON, RETICCTPCT in the last 72 hours.  Coagulation profile  Recent Labs Lab 08/23/16 0928  INR 0.98    No results for input(s): DDIMER in the last 72 hours.  Cardiac Enzymes  Recent Labs Lab 08/24/16 2118 08/25/16 0316 08/25/16 0912  TROPONINI 0.03* 0.03* <0.03   ------------------------------------------------------------------------------------------------------------------ No results found for: BNP  Micro Results Recent Results (from the past 240 hour(s))  Culture, blood (Routine X 2) w Reflex to ID Panel     Status: None (Preliminary result)   Collection Time: 08/24/16  5:30 PM  Result Value Ref Range Status   Specimen Description BLOOD RIGHT ANTECUBITAL  Final   Special Requests   Final    BOTTLES DRAWN AEROBIC AND ANAEROBIC Blood Culture  adequate volume   Culture NO GROWTH < 24 HOURS  Final   Report Status PENDING  Incomplete  Culture, blood (Routine X 2) w Reflex to ID Panel     Status: Abnormal (Preliminary result)   Collection Time: 08/24/16  5:43 PM  Result Value Ref Range Status   Specimen Description BLOOD RIGHT HAND  Final   Special Requests   Final    BOTTLES DRAWN AEROBIC AND ANAEROBIC Blood Culture adequate volume   Culture  Setup Time   Final    GRAM NEGATIVE RODS AEROBIC BOTTLE ONLY CRITICAL RESULT CALLED TO, READ BACK BY AND VERIFIED WITH: T. Dang Pharm.D. 15:50 08/25/16 ([email or web address])    Culture (A)  Final    PSEUDOMONAS AERUGINOSA SUSCEPTIBILITIES TO FOLLOW  Report Status PENDING  Incomplete  Blood Culture ID Panel (Reflexed)     Status: Abnormal   Collection Time: 08/24/16  5:43 PM  Result Value Ref Range Status   Enterococcus species NOT DETECTED NOT DETECTED Final   Listeria monocytogenes NOT DETECTED NOT DETECTED Final   Staphylococcus species NOT DETECTED NOT DETECTED Final   Staphylococcus aureus NOT DETECTED NOT DETECTED Final   Streptococcus species NOT DETECTED NOT DETECTED Final   Streptococcus agalactiae NOT DETECTED NOT DETECTED Final   Streptococcus pneumoniae NOT DETECTED NOT DETECTED Final   Streptococcus pyogenes NOT DETECTED NOT DETECTED Final   Acinetobacter baumannii NOT DETECTED NOT DETECTED Final   Enterobacteriaceae species NOT DETECTED NOT DETECTED Final   Enterobacter cloacae complex NOT DETECTED NOT DETECTED Final   Escherichia coli NOT DETECTED NOT DETECTED Final   Klebsiella oxytoca NOT DETECTED NOT DETECTED Final   Klebsiella pneumoniae NOT DETECTED NOT DETECTED Final   Proteus species NOT DETECTED NOT DETECTED Final   Serratia marcescens NOT DETECTED NOT DETECTED Final   Carbapenem resistance NOT DETECTED NOT DETECTED Final   Haemophilus influenzae NOT DETECTED NOT DETECTED Final   Neisseria meningitidis NOT DETECTED NOT DETECTED Final   Pseudomonas aeruginosa DETECTED  (A) NOT DETECTED Final    Comment: CRITICAL RESULT CALLED TO, READ BACK BY AND VERIFIED WITH: T. Dang Pharm.D. 15:50 08/25/16 ([email or web address])    Candida albicans NOT DETECTED NOT DETECTED Final   Candida glabrata NOT DETECTED NOT DETECTED Final   Candida krusei NOT DETECTED NOT DETECTED Final   Candida parapsilosis NOT DETECTED NOT DETECTED Final   Candida tropicalis NOT DETECTED NOT DETECTED Final  MRSA PCR Screening     Status: None   Collection Time: 08/24/16  8:38 PM  Result Value Ref Range Status   MRSA by PCR NEGATIVE NEGATIVE Final    Comment:        The GeneXpert MRSA Assay (FDA approved for NASAL specimens only), is one component of a comprehensive MRSA colonization surveillance program. It is not intended to diagnose MRSA infection nor to guide or monitor treatment for MRSA infections.     Radiology Reports Dg Chest 2 View  Result Date: 08/24/2016 CLINICAL DATA:  Possible sepsis. EXAM: CHEST  2 VIEW COMPARISON:  Radiographs of June 11, 2015. FINDINGS: The heart size and mediastinal contours are within normal limits. Both lungs are clear. No pneumothorax or pleural effusion is noted. Atherosclerosis of thoracic aorta is noted. The visualized skeletal structures are unremarkable. IMPRESSION: No active cardiopulmonary disease.  Aortic atherosclerosis. Electronically Signed   By: Lupita Raider, M.D.   On: 08/24/2016 17:24   Ct Head Wo Contrast  Result Date: 08/24/2016 CLINICAL DATA:  Episode of unresponsiveness following dialysis. EXAM: CT HEAD WITHOUT CONTRAST TECHNIQUE: Contiguous axial images were obtained from the base of the skull through the vertex without intravenous contrast. COMPARISON:  06/13/2016 FINDINGS: Brain: No evidence of acute infarction, hemorrhage, hydrocephalus, extra-axial collection or mass lesion/mass effect. The ventricles and sulci are enlarged, greater than expected for patient age, reflecting moderate generalized atrophy. There is also patchy white  matter hypoattenuation that is consistent with mild to moderate chronic microvascular ischemic change. Findings are stable from the prior exam. Vascular: No hyperdense vessel or unexpected calcification. Skull: Normal. Negative for fracture or focal lesion. Sinuses/Orbits: Globes and orbits are unremarkable. Visualized sinuses and mastoid air cells are clear. Other: None. IMPRESSION: 1. No acute intracranial abnormalities. 2. Atrophy chronic microvascular ischemic change stable from prior exam. Electronically Signed   By:  Amie Portland M.D.   On: 08/24/2016 19:33      Dg Chest Port 1 View  Result Date: 08/25/2016 CLINICAL DATA:  Shortness of breath. History of diabetes, hypertension, and previous CVA. Dialysis dependent renal failure. EXAM: PORTABLE CHEST 1 VIEW COMPARISON:  Chest x-ray of Aug 24, 2016 FINDINGS: The lungs are adequately inflated and clear. The heart is top-normal in size. The pulmonary vascularity is normal. There is calcification in the wall of the aortic arch. There is no pleural effusion or pneumothorax. The observed bony thorax exhibits no acute abnormality. IMPRESSION: There is no active cardiopulmonary disease. Thoracic aortic atherosclerosis. Electronically Signed   By: David  Swaziland M.D.   On: 08/25/2016 07:30   Dg Abd Portable 1v  Result Date: 08/24/2016 CLINICAL DATA:  Nausea EXAM: PORTABLE ABDOMEN - 1 VIEW COMPARISON:  None. FINDINGS: Gastrostomy catheter is noted in the upper accident. Contrast material is noted within the colon. No obstructive changes are noted. Diffuse vascular calcifications are seen. No bony abnormality is noted. No free air is seen. IMPRESSION: Gastrostomy in place.  No acute abnormality noted. Electronically Signed   By: Alcide Clever M.D.   On: 08/24/2016 21:07    Time Spent in minutes  30   Susa Raring M.D on 08/26/2016 at 9:43 AM  Between 7am to 7pm - Pager - [phone] ( page via [email or web address], text pages only, please mention full 10 digit call  back number). After 7pm go to [email or web address] - password [email or web address]

## 2016-08-26 NOTE — Clinical Social Work Note (Signed)
CSW advised that patient is a [facility]. Patient is oriented to self only so attempts made to talk with family to confirm return to facility. CSW attempted to reach husband, Robyne PeersClifton Nordgren at numbers provided, and spoke with someone at work number and was informed that Mr. Wuertz is deceased (request made to have husband's name deleted from contacts). Call made to relative Pati GalloBrenda Gage at [phone] and message left. CSW will continue attempts to reach family member.  Genelle BalVanessa Ferdinando Lodge, MSW, LCSW Licensed Clinical Social Worker Clinical Social Work Department [facility] [phone]

## 2016-08-26 NOTE — Progress Notes (Signed)
Hypoglycemic Event  CBG: 59  Treatment: 1 tube instant glucose  Symptoms: None  Follow-up CBG: Time:0059 CBG Result:83  Possible Reasons for Event: Inadequate meal intake  Comments/MD notified: per hypoglycemia protocol    [name] Joselita

## 2016-08-26 NOTE — Progress Notes (Signed)
Nutrition Follow-up  DOCUMENTATION CODES:   Non-severe (moderate) malnutrition in context of chronic illness  INTERVENTION:   Encourage po intake at meals.   Start bolus TF 5/4 at low volume and increase as tolerated:  5/4 1/2 can Nepro TID 5/5 1 can Nepro TID 5/6 1 can Nepro BID and 1.5 cans daily (0800, 1200, 1700)  Provides: 1487 kcal, 66 grams protein, and 602 ml free water  Monitor magnesium, potassium, and phosphorus daily for at least 3 days, MD to replete as needed, as pt is at risk for refeeding syndrome given moderate malnutrition.   NUTRITION DIAGNOSIS:   Malnutrition (Moderate) related to chronic illness (ESRD) as evidenced by moderate depletions of muscle mass, moderate depletion of body fat. Ongoing.   GOAL:   Patient will meet greater than or equal to 90% of their needs Progressing.   MONITOR:   PO intake, TF tolerance, Labs  REASON FOR ASSESSMENT:   Consult Assessment of nutrition requirement/status (Start TF 5/4)  ASSESSMENT:   Pt with hx of ESRD on HD MWF, DM, severe PVD, dementia, R AKA, L BKA, L hand amputation. Husband died 2/18 and has been declining. SIL making decisions. Admitted from HD with loss of consciousness  Spoke with RN, pt given 1 can Glucerna Shake via PEG Pt only ate bites at Breakfast Plan to start bolus feedings today at low volume and increase as tolerated to goal per orders.  CBG's: 76-75-71  Diet Order:  DIET DYS 3 Room service appropriate? Yes; Fluid consistency: Thin  Skin:  Reviewed, no issues  Last BM:  5/3  Height:   Ht Readings from Last 1 Encounters:  08/25/16 5\' 4"  (1.626 m)    Weight:   Wt Readings from Last 1 Encounters:  08/25/16 114 lb 10.2 oz (52 kg)    Ideal Body Weight:  46.2 kg  BMI:  Body mass index is 19.68 kg/m.  Estimated Nutritional Needs:   Kcal:  1400-1600  Protein:  65-71 grams  Fluid:  1.2 L/day  EDUCATION NEEDS:   No education needs identified at this time  Kendell BaneHeather  Calib Wadhwa RD, LDN, CNSC [phone] Pager [phone] After Hours Pager

## 2016-08-26 NOTE — Progress Notes (Signed)
Received critical glucose of 581 from Lab. Patient is getting Dextrose 5% at 50 cc/hr. Rechecked it with glucometer and got 76. K. Kirby,NP text paged. Order received in epic for repeat BMET. Will continue to monitor. Jamus Loving, Drinda Buttsharito Joselita, RCharity fundraiser

## 2016-08-26 NOTE — Progress Notes (Signed)
  Echocardiogram 2D Echocardiogram has been performed.  Cataleah Stites T Lisette Mancebo 08/26/2016, 11:35 AM

## 2016-08-26 NOTE — Progress Notes (Signed)
Received return call from Case Manager at [facility] regarding electric wheel chair for [name].  Case manager states that WC cannot be obtained while pt is in a facility, as is the case here.  She states that [name] has inquired about this and they have looked into it for her, but WC can only be obtained [facility] as an [facility].    Will sign off.  Bonnie BatonJulie W. Peg Fifer, RN, BSN  Trauma/Neuro ICU Case Manager [phone]

## 2016-08-26 NOTE — Progress Notes (Signed)
[facility] Progress Note   Subjective:   Seen in room. Denies CP or dyspnea. Says glucose has been better today. Now with PEG tube.  Objective Vitals:   08/25/16 1700 08/25/16 1959 08/26/16 0619 08/26/16 1033  BP: 113/90 (!) 122/54 127/65 131/76  Pulse: 84 82 82 86  Resp:  15 15 16   Temp: 98.3 F (36.8 C) 98.5 F (36.9 C) 98.3 F (36.8 C) 97.6 F (36.4 C)  TempSrc: Oral Oral Oral Oral  SpO2: 100% 100% 100% 100%  Weight:  52 kg (114 lb 10.2 oz)    Height:       Physical Exam General: Chronically ill appearing female. NAD, baseline mental status. Heart: RRR; no murmur. Lungs: CTAB Extremities: R AKA, L BKA, no stump edema Dialysis Access: LUE AVF + thrill  Additional Objective Labs: Basic Metabolic Panel:  Recent Labs Lab 08/25/16 0316 08/26/16 0405 08/26/16 0647  NA 130* 111* 124*  K 3.6 3.0* 3.8  CL 95* 81* 91*  CO2 25 22 24   GLUCOSE 62* 581* 73  BUN 8 12 15   CREATININE 2.02* 2.53* 2.87*  CALCIUM 8.4* 7.4* 8.3*   Liver Function Tests:  Recent Labs Lab 08/24/16 1543  AST 43*  ALT 16  ALKPHOS 161*  BILITOT 1.1  PROT 6.5  ALBUMIN 2.5*   CBC:  Recent Labs Lab 08/23/16 0928 08/24/16 1540 08/25/16 0316 08/26/16 0405  WBC 7.5 5.9 10.6* 7.5  HGB 12.8 13.1 12.4 10.4*  HCT 39.3 39.0 37.2 31.7*  MCV 95.9 94.7 93.9 93.2  PLT 185 171 131* 126*   Blood Culture    Component Value Date/Time   SDES BLOOD RIGHT HAND 08/24/2016 1743   SPECREQUEST  08/24/2016 1743    BOTTLES DRAWN AEROBIC AND ANAEROBIC Blood Culture adequate volume   CULT (A) 08/24/2016 1743    PSEUDOMONAS AERUGINOSA SUSCEPTIBILITIES TO FOLLOW    REPTSTATUS PENDING 08/24/2016 1743    Cardiac Enzymes:  Recent Labs Lab 08/24/16 2118 08/25/16 0316 08/25/16 0912  TROPONINI 0.03* 0.03* <0.03   CBG:  Recent Labs Lab 08/26/16 0059 08/26/16 0346 08/26/16 0527 08/26/16 0732 08/26/16 1209  GLUCAP 83 97 76 75 71   Studies/Results: Dg Chest 2 View  Result  Date: 08/24/2016 CLINICAL DATA:  Possible sepsis. EXAM: CHEST  2 VIEW COMPARISON:  Radiographs of June 11, 2015. FINDINGS: The heart size and mediastinal contours are within normal limits. Both lungs are clear. No pneumothorax or pleural effusion is noted. Atherosclerosis of thoracic aorta is noted. The visualized skeletal structures are unremarkable. IMPRESSION: No active cardiopulmonary disease.  Aortic atherosclerosis. Electronically Signed   By: Lupita Raider, M.D.   On: 08/24/2016 17:24   Ct Head Wo Contrast  Result Date: 08/24/2016 CLINICAL DATA:  Episode of unresponsiveness following dialysis. EXAM: CT HEAD WITHOUT CONTRAST TECHNIQUE: Contiguous axial images were obtained from the base of the skull through the vertex without intravenous contrast. COMPARISON:  06/13/2016 FINDINGS: Brain: No evidence of acute infarction, hemorrhage, hydrocephalus, extra-axial collection or mass lesion/mass effect. The ventricles and sulci are enlarged, greater than expected for patient age, reflecting moderate generalized atrophy. There is also patchy white matter hypoattenuation that is consistent with mild to moderate chronic microvascular ischemic change. Findings are stable from the prior exam. Vascular: No hyperdense vessel or unexpected calcification. Skull: Normal. Negative for fracture or focal lesion. Sinuses/Orbits: Globes and orbits are unremarkable. Visualized sinuses and mastoid air cells are clear. Other: None. IMPRESSION: 1. No acute intracranial abnormalities. 2. Atrophy chronic microvascular ischemic change  stable from prior exam. Electronically Signed   By: Amie Portlandavid  Ormond M.D.   On: 08/24/2016 19:33   Dg Chest Port 1 View  Result Date: 08/25/2016 CLINICAL DATA:  Shortness of breath. History of diabetes, hypertension, and previous CVA. Dialysis dependent renal failure. EXAM: PORTABLE CHEST 1 VIEW COMPARISON:  Chest x-ray of Aug 24, 2016 FINDINGS: The lungs are adequately inflated and clear. The heart  is top-normal in size. The pulmonary vascularity is normal. There is calcification in the wall of the aortic arch. There is no pleural effusion or pneumothorax. The observed bony thorax exhibits no acute abnormality. IMPRESSION: There is no active cardiopulmonary disease. Thoracic aortic atherosclerosis. Electronically Signed   By: David  SwazilandJordan M.D.   On: 08/25/2016 07:30   Dg Abd Portable 1v  Result Date: 08/24/2016 CLINICAL DATA:  Nausea EXAM: PORTABLE ABDOMEN - 1 VIEW COMPARISON:  None. FINDINGS: Gastrostomy catheter is noted in the upper accident. Contrast material is noted within the colon. No obstructive changes are noted. Diffuse vascular calcifications are seen. No bony abnormality is noted. No free air is seen. IMPRESSION: Gastrostomy in place.  No acute abnormality noted. Electronically Signed   By: Alcide CleverMark  Lukens M.D.   On: 08/24/2016 21:07   Medications: . cefTAZidime (FORTAZ)  IV     . aspirin  81 mg Per Tube Daily  . atorvastatin  10 mg Per Tube Daily  . feeding supplement (GLUCERNA SHAKE)  237 mL Per Tube TID BM  . free water  100 mL Per Tube Q8H  . heparin subcutaneous  5,000 Units Subcutaneous Q8H  . insulin aspart  0-15 Units Subcutaneous Q4H  . levothyroxine  25 mcg Per Tube QAC breakfast    Dialysis Orders: [facility] MWF 4 hrs 180 NRe 49 kg 400/auto 1.5 UF Profile 4 3.0 K/2.25 Ca  Heparin 2200 units IV TIW  Mircera 30 mcg IV q 2 weeks (last HGB 11.5 Ferritin 3267 Fe 44 Tsat 38% 08/17/16)  No binders/VDRA (Ca 9.3 C Ca 10.3 Phos 2.3 PTH 88 08/17/16)  Assessment/Plan: 1.  Hypoglycemia: Improving and HYPERglycemic this morning (with hyponatremia, improved on recheck). Per primary. 2. Pseudomonas bacteremia (?): 1 of 2 BCx 5/2 + for pseudomonas, ?contaminant. Per ID, getting 1 week of Ceftazidime. 3.  ESRD: Continue HD per MWF schedule, next 5/4. 4.  Hypertension/volume: BP fine, EDW has been lowered recently due to weight loss, poor nutrition.  5.  Anemia: Hgb 10.4,  monitor. May need to start ESA. 6.  Metabolic bone disease: No binders/VDRA 7.  Nutrition: Albumin 2.5. Severe PCM. 8.  FTT: Briefly discussed consideration of stopping dialysis. She says she is fine for now. Consider palliative care for deeper conversation, but will continue HD for now.  Ozzie HoyleKatie Stovall, PA-C 08/26/2016, 12:17 PM  [facility] Pager: [phone]  Pt seen, examined and agree w A/P as above.  Vinson Moselleob Taria Castrillo MD [facility] pager [phone]   08/26/2016, 3:20 PM

## 2016-08-27 LAB — CULTURE, BLOOD (ROUTINE X 2): Special Requests: ADEQUATE

## 2016-08-27 LAB — GLUCOSE, CAPILLARY
GLUCOSE-CAPILLARY: 67 mg/dL (ref 65–99)
GLUCOSE-CAPILLARY: 81 mg/dL (ref 65–99)
GLUCOSE-CAPILLARY: 68 mg/dL (ref 65–99)
GLUCOSE-CAPILLARY: 227 mg/dL — AB (ref 65–99)
Glucose-Capillary: 78 mg/dL (ref 65–99)
Glucose-Capillary: 92 mg/dL (ref 65–99)

## 2016-08-27 LAB — MAGNESIUM: Magnesium: 1.8 mg/dL (ref 1.7–2.4)

## 2016-08-27 LAB — PHOSPHORUS: Phosphorus: 2.4 mg/dL — ABNORMAL LOW (ref 2.5–4.6)

## 2016-08-27 MED ORDER — PROBIOTIC ACIDOPHILUS PO CAPS: 1.0000 | ORAL_CAPSULE | Freq: Two times a day (BID) | ORAL | Status: DC

## 2016-08-27 MED ORDER — GLUCERNA SHAKE PO LIQD
237.0000 mL | Freq: Four times a day (QID) | ORAL | Status: DC
  Administered 2016-08-27: 237 mL via ORAL

## 2016-08-27 MED ORDER — CIPROFLOXACIN HCL 500 MG PO TABS: 500.0000 mg | ORAL_TABLET | Freq: Every day | ORAL | Status: DC

## 2016-08-27 MED ORDER — NEPRO/CARBSTEADY PO LIQD: 360.0000 mL | Freq: Three times a day (TID) | ORAL | 0 refills | Status: DC

## 2016-08-27 MED ORDER — ASPIRIN 81 MG PO CHEW: 81.0000 mg | CHEWABLE_TABLET | Freq: Every day | ORAL | Status: AC

## 2016-08-27 MED ORDER — FREE WATER: 100.0000 mL | Freq: Three times a day (TID) | Status: DC

## 2016-08-27 MED ORDER — LEVOTHYROXINE SODIUM 25 MCG PO TABS: 25.0000 ug | ORAL_TABLET | Freq: Every day | ORAL | Status: AC

## 2016-08-27 NOTE — Discharge Summary (Signed)
[name] UVO:[id number] DOB: [birth year] DOA: 08/24/2016  PCP: Terrial Rhodes, MD  Admit date: 08/24/2016  Discharge date: 08/27/2016  Admitted From: SNF  Disposition:  SNf   Recommendations for Outpatient Follow-up:   Follow up with PCP in 1-2 weeks  PCP Please obtain BMP/CBC, 2 view CXR in 1week,  (see Discharge instructions)   PCP Please follow up on the following pending results: monitor CBGs QAC-HS   Home Health: None  Equipment/Devices: None  Consultations: ID over phone, Renal Discharge Condition: Fair   CODE STATUS: DNR   Diet Recommendation: DIET DYS 3 with full feeding assistance and aspiration precautions   Chief Complaint  Patient presents with  . Loss of Consciousness     Brief history of present illness from the day of admission and additional interim summary     [name] a 56 y.o.female,With history of stroke, dysphagia, recent PEG tube placement, ESRD on Monday, Wednesday and Friday dialysis under Dr. 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 Her PEG tube is still not under use, she went to dialysis unit today and became unresponsive thereafter was brought to the ER                                                                 Hospital Course    1. Encephalopathy. Unclear etiology likely from combination of Hypotension post dialysis and Hypoglycemia from poor Oral intake, CT head -ve, is now at baseline post D5W drip and now stable BP and CBG, Although  blood cultures have come back Pseudomonas positive I do not think she was septic at the time of admission Pseudomonas could have been a skin contamination however we will go ahead and treat her for a short course as instructed by ID.  Her mental status had rapidly improved in the ER after she received D5W and CBGs normalized and blood pressure improved. Continue to keep an eye on her blood pressure and CBGs. Use PEG tube for feeds as needed.  2.DM type II with frequent hypoglycemia due to poor oral intake. Now placed on oral diet and scheduled supplements 4 times a day via PEG tube , continue to monitor CBGs before every meal at bedtime no insulin related  Lab Results  Component Value Date   HGBA1C <4.2 (L) 08/24/2016   CBG (last 3)   Recent Labs  08/27/16 0114 08/27/16 0749 08/27/16 0828  GLUCAP 78 67 81     3.Dysphagia and stroke with Moderate PCM. PEG tube placed 08/23/2016, IR  cleared for PEG tube feeds on 08/26/2016, PEG tube feeds and free water flushes started, dietitian consulted, also cleared by speech and currently on dysphagia 3 diet with full feeding assistance and aspiration precautions. Please continue to monitor CBGs before every meal at bedtime.  4.History of stroke, left BKA, right AKA, left arm amputation. Supportive care. SNF discharge once better. Continue statin and aspirin via PEG tube.  5.ESRD M, W, F dialysis schedule. Renal on board was dialyzed on Friday.  6.Anxiety.Stable.  7.Non specific EKG changes - pain free, Trop stable and in non-ACS pattern likely due to ESRD, seen and cleared by cardiology no further workup.  8.High Qtc - Home venlafaxine stopped, repeat EKG shows much improved QTC of 498 ms.    9. Hypothyroidism - placed on Synthroid (with hypoglycemia, poor apetite, mild hypothermia).Request SNF staff to recheck TSH in 3-4 weeks.  10. Pseudomonas bacteremia could be contamination however cannot be overlooked, no clear source,  discussed with ID physician Dr. Orvan Falconer , Based on ultrasensitive at [facility] she was given [place] IV here and then will be transitioned to 10 more days of oral Cipro.    Discharge diagnosis     Principal Problem:   Mental status, decreased Active Problems:   ESRD (end stage renal disease) on dialysis (HCC)   Secondary renal hyperparathyroidism (HCC)   Type 2 diabetes mellitus, controlled, with renal complications (HCC)   S/P AKA (above knee amputation) unilateral (HCC)   Hypoglycemia due to type 2 diabetes mellitus [facility])    Discharge instructions    Discharge Instructions    Discharge instructions    Complete by:  As directed    Follow with Primary MD Terrial Rhodes, MD in 7 days   Get CBC, CMP, 2 view Chest X ray checked  by Primary MD or SNF MD in 5-7 days ( we routinely change or add medications that can affect your baseline labs and fluid status, therefore we recommend that you get the mentioned basic workup next visit with your PCP, your PCP may decide not to get them or add new tests based on their clinical decision)  Activity: As tolerated with Full fall precautions use walker/cane & assistance as needed  Disposition SNF  Diet:   DIET DYS 3  with feeding assistance and aspiration precautions.  Accuchecks 4 times/day, Once in AM empty stomach and then before each meal. Log in all results and show them to your Prim.MD in 3 days. If any glucose reading is under 80 or above 300 call your Prim MD immidiately. Follow Low glucose instructions for glucose under 80 as instructed.   For Heart failure patients - Check your Weight same time everyday, if you gain over 2 pounds, or you develop in leg swelling, experience more shortness of breath or chest pain, call your Primary MD immediately. Follow Cardiac Low Salt Diet and 1.5 lit/day fluid restriction.  On your next visit with your primary care physician please Get Medicines reviewed and adjusted.  Please request your  Prim.MD to go over all Hospital Tests and Procedure/Radiological results at the follow up, please get all Hospital records sent to your Prim MD by signing hospital release before you go home.  If you experience worsening of your admission symptoms, develop shortness of breath, life threatening emergency, suicidal or homicidal thoughts you must seek medical attention immediately by calling 911 or calling your MD immediately  if symptoms less severe.  You Must read complete instructions/literature along with all the possible adverse reactions/side effects  for all the Medicines you take and that have been prescribed to you. Take any new Medicines after you have completely understood and accpet all the possible adverse reactions/side effects.   Do not drive, operate heavy machinery, perform activities at heights, swimming or participation in water activities or provide baby sitting services if your were admitted for syncope or siezures until you have seen by Primary MD or a Neurologist and advised to do so again.  Do not drive when taking Pain medications.    Do not take more than prescribed Pain, Sleep and Anxiety Medications  Special Instructions: If you have smoked or chewed Tobacco  in the last 2 yrs please stop smoking, stop any regular Alcohol  and or any Recreational drug use.  Wear Seat belts while driving.   Please note  You were cared for by a hospitalist during your hospital stay. If you have any questions about your discharge medications or the care you received while you were in the hospital after you are discharged, you can call the unit and asked to speak with the hospitalist on call if the hospitalist that took care of you is not available. Once you are discharged, your primary care physician will handle any further medical issues. Please note that NO REFILLS for any discharge medications will be authorized once you are discharged, as it is imperative that you return to your primary  care physician (or establish a relationship with a primary care physician if you do not have one) for your aftercare needs so that they can reassess your need for medications and monitor your lab values.   Increase activity slowly    Complete by:  As directed       Discharge Medications   Allergies as of 08/27/2016      Reactions   Codeine Hives   Levofloxacin Other (See Comments)   Unknown, but noted on MAR   Penicillins Hives   Has patient had a PCN reaction causing immediate rash, facial/tongue/throat swelling, SOB or lightheadedness with hypotension: Yes Has patient had a PCN reaction causing severe rash involving mucus membranes or skin necrosis: Unk Has patient had a PCN reaction that required hospitalization: Unk Has patient had a PCN reaction occurring within the last 10 years: Unk If all of the above answers are "NO", then may proceed with Cephalosporin use.      Medication List    STOP taking these medications   HYDROcodone-acetaminophen 5-325 MG tablet Commonly known as:  NORCO/VICODIN   LORazepam 2 MG/ML concentrated solution Commonly known as:  ATIVAN   meclizine 25 MG tablet Commonly known as:  ANTIVERT   promethazine 12.5 MG tablet Commonly known as:  PHENERGAN   UNABLE TO FIND   Venlafaxine HCl 225 MG Tb24     TAKE these medications   antiseptic oral rinse Liqd 15 mLs by Mouth Rinse route 3 (three) times daily after meals. After meals   aspirin 81 MG chewable tablet Place 1 tablet (81 mg total) into feeding tube daily. Start taking on:  08/28/2016   atorvastatin 10 MG tablet Commonly known as:  LIPITOR Take 10 mg by mouth daily.   ciprofloxacin 500 MG tablet Commonly known as:  CIPRO Take 1 tablet (500 mg total) by mouth daily with breakfast. Stop date 09/05/2016   feeding supplement (NEPRO CARB STEADY) Liqd Place 360 mLs into feeding tube 3 (three) times daily with meals.   free water Soln Place 100 mLs into feeding tube every 8 (eight)  hours.  GLUCAGON EMERGENCY 1 MG injection Generic drug:  glucagon Inject 1 mg into the muscle once as needed (type 2 diabetes).   guaiFENesin 100 MG/5ML Soln Commonly known as:  ROBITUSSIN Take 20 mLs by mouth every 8 (eight) hours.   guaiFENesin-codeine 100-10 MG/5ML syrup Commonly known as:  ROBITUSSIN AC Take 30 mLs by mouth every 6 (six) hours as needed for cough.   hydrocortisone cream 1 % Apply 1 application topically every 8 (eight) hours as needed for itching (apply to back as needed).   levothyroxine 25 MCG tablet Commonly known as:  SYNTHROID, LEVOTHROID Place 1 tablet (25 mcg total) into feeding tube daily before breakfast. Start taking on:  08/28/2016   mirtazapine 30 MG tablet Commonly known as:  REMERON Take 30 mg by mouth at bedtime.   NON FORMULARY Nepro TF: 20 ml's/hr and advance by 10 ml's every twelve hours until the goal of 40 ml's/hr is reached   nystatin ointment Commonly known as:  MYCOSTATIN Apply 1 application topically 3 (three) times daily after meals. Apply to corners of mouth topically after meals for dry mouth   ondansetron 4 MG tablet Commonly known as:  ZOFRAN Take 4 mg by mouth every 8 (eight) hours as needed for nausea or vomiting.   pantoprazole 40 MG tablet Commonly known as:  PROTONIX Take 40 mg by mouth daily.   PROBIOTIC ACIDOPHILUS Caps Take 1 ampule by mouth 2 (two) times daily.   senna-docusate 8.6-50 MG tablet Commonly known as:  Senokot-S Take 2 tablets by mouth at bedtime.   traMADol 50 MG tablet Commonly known as:  ULTRAM Take 1 tablet (50 mg total) by mouth every 12 (twelve) hours as needed for moderate pain.       Follow-up Information    Terrial Rhodesoladonato, Joseph, MD. Schedule an appointment as soon as possible for a visit in 1 week(s).   Specialty:  Nephrology Contact information: [address] [place] [id number] [phone]           Major procedures and Radiology Reports - PLEASE review detailed and  final reports thoroughly  -         Dg Chest 2 View  Result Date: 08/24/2016 CLINICAL DATA:  Possible sepsis. EXAM: CHEST  2 VIEW COMPARISON:  Radiographs of June 11, 2015. FINDINGS: The heart size and mediastinal contours are within normal limits. Both lungs are clear. No pneumothorax or pleural effusion is noted. Atherosclerosis of thoracic aorta is noted. The visualized skeletal structures are unremarkable. IMPRESSION: No active cardiopulmonary disease.  Aortic atherosclerosis. Electronically Signed   By: Lupita RaiderJames  Green Jr, M.D.   On: 08/24/2016 17:24   Ct Head Wo Contrast  Result Date: 08/24/2016 CLINICAL DATA:  Episode of unresponsiveness following dialysis. EXAM: CT HEAD WITHOUT CONTRAST TECHNIQUE: Contiguous axial images were obtained from the base of the skull through the vertex without intravenous contrast. COMPARISON:  06/13/2016 FINDINGS: Brain: No evidence of acute infarction, hemorrhage, hydrocephalus, extra-axial collection or mass lesion/mass effect. The ventricles and sulci are enlarged, greater than expected for patient age, reflecting moderate generalized atrophy. There is also patchy white matter hypoattenuation that is consistent with mild to moderate chronic microvascular ischemic change. Findings are stable from the prior exam. Vascular: No hyperdense vessel or unexpected calcification. Skull: Normal. Negative for fracture or focal lesion. Sinuses/Orbits: Globes and orbits are unremarkable. Visualized sinuses and mastoid air cells are clear. Other: None. IMPRESSION: 1. No acute intracranial abnormalities. 2. Atrophy chronic microvascular ischemic change stable from prior exam. Electronically Signed  By: Amie Portland M.D.   On: 08/24/2016 19:33   Ct Abdomen Pelvis W Contrast  Result Date: 08/10/2016 CLINICAL DATA:  Decreased appetite for 1 week, occasional right hip pain, diarrhea alternating with constipation, dialysis starting 2012 EXAM: CT ABDOMEN AND PELVIS WITH CONTRAST  TECHNIQUE: Multidetector CT imaging of the abdomen and pelvis was performed using the standard protocol following bolus administration of intravenous contrast. CONTRAST:  80mL ISOVUE-300 IOPAMIDOL (ISOVUE-300) INJECTION 61% COMPARISON:  CT scan 07/30/2011 FINDINGS: Lower chest: Lung bases shows no acute findings. There is NG-tube with tip in distal stomach Hepatobiliary: Mild fatty infiltration of the liver. No focal hepatic mass. Small perihepatic ascites. Pancreas: Unremarkable. No pancreatic ductal dilatation or surrounding inflammatory changes. Spleen: Normal in size without focal abnormality. Adrenals/Urinary Tract: No adrenal gland mass. Bilateral kidneys shows cortical thinning and decreased enhancement. No any renal excretion is noted on delay images consistent with chronic decreased renal function. Extensive atherosclerotic calcifications are noted bilateral renal artery. Stomach/Bowel: Oral contrast material was given to the patient. No small bowel obstruction. No thickened or dilated small bowel loops. No gastric outlet obstruction. Normal appendix. No pericecal inflammation. Moderate stool and some contrast material noted within right colon. Some contrast material liquid stool and gas noted within transverse colon. Moderate stool noted within descending colon. Abundant stool noted within rectum. The rectum is distended with stool up to 7.4 cm highly suspicious for distal fecal impaction. Vascular/Lymphatic: Extensive atherosclerotic calcifications of abdominal aorta and iliac arteries. Extensive atherosclerotic calcifications of SMA and splenic artery. Atherosclerotic calcification of internal mesenteric artery. No retroperitoneal or mesenteric adenopathy Reproductive: The uterus is small size minimal retroverted. No adnexal mass. Other: No evidence of free abdominal air. Trace perisplenic ascites. There is thickening of urinary bladder wall. Chronic inflammation or cystitis cannot be excluded.  Musculoskeletal: There is Schmorl's node deformity and mild to moderate compression deformity upper endplate of L4 vertebral body of indeterminate age. No bony protrusion in spinal canal. There is disc space flattening with endplate sclerotic changes and mild posterior disc bulge at L5-S1 level. Mild disc space flattening with posterior disc bulge at L3-L4 level. Schmorl's node deformity upper endplate of L2. There is disc space flattening with endplate sclerotic changes at T11-T12 level. Minimal compression deformity upper endplate of T12 of indeterminate age. Schmorl's node deformity upper endplate of T12 and T11 vertebral bodies. IMPRESSION: 1. The patient is status post cholecystectomy. No focal hepatic mass. Small perihepatic ascites 2. Bilateral renal size bilateral kidney is small size with cortical thinning and extensive atherosclerotic vascular calcification. There is decreased enhancement and absent excretion on delay images consistent with chronic renal insufficiency no hydronephrosis or hydroureter. 3. No small bowel obstruction. 4. No pericecal inflammation.  Normal appendix. 5. Abundant stool noted within rectum. The rectum is distended with stool up to 7.4 cm suspicious for distal fecal impaction. 6. There is mild thickening of urinary bladder wall. Chronic inflammation or cystitis cannot be excluded. 7. Degenerative changes thoracolumbar spine. There is Schmorl's node deformity and mild to moderate compression deformity upper endplate of L4 vertebral body of indeterminate age. Electronically Signed   By: Natasha Mead M.D.   On: 08/10/2016 08:52   Ir Gastrostomy Tube Mod Sed  Result Date: 08/23/2016 INDICATION: 56 year old female with a history of dysphagia EXAM: IMAGE GUIDED GASTROSTOMY TUBE MEDICATIONS: 1.0 g vancomycin; Antibiotics were administered within 1 hour of the procedure. ANESTHESIA/SEDATION: Versed 1.0 mg IV; Fentanyl 50 mcg IV Moderate Sedation Time:  10 minutes The patient was  continuously  monitored during the procedure by the interventional radiology nurse under my direct supervision. CONTRAST:  10 cc - administered into the gastric lumen. FLUOROSCOPY TIME:  Fluoroscopy Time: 1 minutes 24 seconds (13 mGy). COMPLICATIONS: None PROCEDURE: Informed written consent was obtained from the patient's family after a thorough discussion of the procedural risks, benefits and alternatives. All questions were addressed. Maximal Sterile Barrier Technique was utilized including caps, mask, sterile gowns, sterile gloves, sterile drape, hand hygiene and skin antiseptic. A timeout was performed prior to the initiation of the procedure. The procedure, risks, benefits, and alternatives were explained to the patient. Questions regarding the procedure were encouraged and answered. The patient understands and consents to the procedure. The epigastrium was prepped with Betadine in a sterile fashion, and a sterile drape was applied covering the operative field. A sterile gown and sterile gloves were used for the procedure. A 5-French orogastric tube is placed under fluoroscopic guidance. Scout imaging of the abdomen confirms barium within the transverse colon. The stomach was distended with gas. Under fluoroscopic guidance, an 18 gauge needle was utilized to puncture the anterior wall of the body of the stomach. An Amplatz wire was advanced through the needle passing a T fastener into the lumen of the stomach. The T fastener was secured for gastropexy. A 9-French sheath was inserted. A snare was advanced through the 9-French sheath. A Teena Dunk was advanced through the orogastric tube. It was snared then pulled out the oral cavity, pulling the snare, as well. The leading edge of the gastrostomy was attached to the snare. It was then pulled down the esophagus and out the percutaneous site. It was secured in place. Contrast was injected. No complication IMPRESSION: Status post percutaneous gastrostomy tube placement.  Signed, Yvone Neu. Loreta Ave, DO [facility] Radiology Electronically Signed   By: Gilmer Mor D.O.   On: 08/23/2016 12:07   Ir Fluoro Rm 30-60 Min  Result Date: 08/16/2016 CLINICAL DATA:  56 year old female with a history dysphagia. Referred for gastrostomy tube. EXAM: SINGLE FLUOROSCOPIC IMAGE DURING PLANNED PERCUTANEOUS GASTROSTOMY TECHNIQUE: Single fluoroscopic image obtained as a planning x-ray for planned percutaneous gastrostomy. COMPARISON:  CT 08/09/2016 FINDINGS: Initial plain film image of the abdomen demonstrates at least 50% opacification of stomach lumen with retained barium. Barium within small bowel. IMPRESSION: Significant volume of retained barium within the stomach lumen in this patient with planned percutaneous gastrostomy. This will preclude placement at this time, with reschedule required. Signed, Yvone Neu. Loreta Ave, DO [facility] Radiology Electronically Signed   By: Gilmer Mor D.O.   On: 08/16/2016 09:10   Dg Chest Port 1 View  Result Date: 08/25/2016 CLINICAL DATA:  Shortness of breath. History of diabetes, hypertension, and previous CVA. Dialysis dependent renal failure. EXAM: PORTABLE CHEST 1 VIEW COMPARISON:  Chest x-ray of Aug 24, 2016 FINDINGS: The lungs are adequately inflated and clear. The heart is top-normal in size. The pulmonary vascularity is normal. There is calcification in the wall of the aortic arch. There is no pleural effusion or pneumothorax. The observed bony thorax exhibits no acute abnormality. IMPRESSION: There is no active cardiopulmonary disease. Thoracic aortic atherosclerosis. Electronically Signed   By: David  [place] M.D.   On: 08/25/2016 07:30   Dg Abd Portable 1v  Result Date: 08/24/2016 CLINICAL DATA:  Nausea EXAM: PORTABLE ABDOMEN - 1 VIEW COMPARISON:  None. FINDINGS: Gastrostomy catheter is noted in the upper accident. Contrast material is noted within the  colon. No obstructive changes  are noted. Diffuse vascular calcifications are seen. No bony abnormality is noted. No free air is seen. IMPRESSION: Gastrostomy in place.  No acute abnormality noted. Electronically Signed   By: Alcide Clever M.D.   On: 08/24/2016 21:07    Micro Results     Recent Results (from the past 240 hour(s))  Culture, blood (Routine X 2) w Reflex to ID Panel     Status: None (Preliminary result)   Collection Time: 08/24/16  5:30 PM  Result Value Ref Range Status   Specimen Description BLOOD RIGHT ANTECUBITAL  Final   Special Requests   Final    BOTTLES DRAWN AEROBIC AND ANAEROBIC Blood Culture adequate volume   Culture NO GROWTH 2 DAYS  Final   Report Status PENDING  Incomplete  Culture, blood (Routine X 2) w Reflex to ID Panel     Status: Abnormal   Collection Time: 08/24/16  5:43 PM  Result Value Ref Range Status   Specimen Description BLOOD RIGHT HAND  Final   Special Requests   Final    BOTTLES DRAWN AEROBIC AND ANAEROBIC Blood Culture adequate volume   Culture  Setup Time   Final    GRAM NEGATIVE RODS AEROBIC BOTTLE ONLY CRITICAL RESULT CALLED TO, READ BACK BY AND VERIFIED WITH: T. Dang Pharm.D. 15:50 08/25/16 ([email or web address])    Culture PSEUDOMONAS AERUGINOSA (A)  Final   Report Status 08/27/2016 FINAL  Final   Organism ID, Bacteria PSEUDOMONAS AERUGINOSA  Final      Susceptibility   Pseudomonas aeruginosa - MIC*    CEFTAZIDIME 4 SENSITIVE Sensitive     CIPROFLOXACIN <=0.25 SENSITIVE Sensitive     GENTAMICIN <=1 SENSITIVE Sensitive     IMIPENEM 1 SENSITIVE Sensitive     PIP/TAZO 8 SENSITIVE Sensitive     CEFEPIME 2 SENSITIVE Sensitive     * PSEUDOMONAS AERUGINOSA  Blood Culture ID Panel (Reflexed)     Status: Abnormal   Collection Time: 08/24/16  5:43 PM  Result Value Ref Range Status   Enterococcus species NOT DETECTED NOT DETECTED Final   Listeria monocytogenes NOT DETECTED NOT DETECTED Final   Staphylococcus species NOT DETECTED NOT DETECTED Final    Staphylococcus aureus NOT DETECTED NOT DETECTED Final   Streptococcus species NOT DETECTED NOT DETECTED Final   Streptococcus agalactiae NOT DETECTED NOT DETECTED Final   Streptococcus pneumoniae NOT DETECTED NOT DETECTED Final   Streptococcus pyogenes NOT DETECTED NOT DETECTED Final   Acinetobacter baumannii NOT DETECTED NOT DETECTED Final   Enterobacteriaceae species NOT DETECTED NOT DETECTED Final   Enterobacter cloacae complex NOT DETECTED NOT DETECTED Final   Escherichia coli NOT DETECTED NOT DETECTED Final   Klebsiella oxytoca NOT DETECTED NOT DETECTED Final   Klebsiella pneumoniae NOT DETECTED NOT DETECTED Final   Proteus species NOT DETECTED NOT DETECTED Final   Serratia marcescens NOT DETECTED NOT DETECTED Final   Carbapenem resistance NOT DETECTED NOT DETECTED Final   Haemophilus influenzae NOT DETECTED NOT DETECTED Final   Neisseria meningitidis NOT DETECTED NOT DETECTED Final   Pseudomonas aeruginosa DETECTED (A) NOT DETECTED Final    Comment: CRITICAL RESULT CALLED TO, READ BACK BY AND VERIFIED WITH: T. Dang Pharm.D. 15:50 08/25/16 ([email or web address])    Candida albicans NOT DETECTED NOT DETECTED Final   Candida glabrata NOT DETECTED NOT DETECTED Final   Candida krusei NOT DETECTED NOT DETECTED Final   Candida parapsilosis NOT DETECTED NOT DETECTED Final   Candida tropicalis NOT DETECTED NOT DETECTED Final  MRSA PCR Screening  Status: None   Collection Time: 08/24/16  8:38 PM  Result Value Ref Range Status   MRSA by PCR NEGATIVE NEGATIVE Final    Comment:        The GeneXpert MRSA Assay (FDA approved for NASAL specimens only), is one component of a comprehensive MRSA colonization surveillance program. It is not intended to diagnose MRSA infection nor to guide or monitor treatment for MRSA infections.     Today   Subjective    [name] today has no headache,no chest abdominal pain,no new weakness tingling or numbness, feels much better wants to go home today.       Objective   Blood pressure (!) 156/79, pulse 86, temperature 97.8 F (36.6 C), temperature source Oral, resp. rate 18, height 5\' 4"  (1.626 m), weight 50.3 kg (110 lb 14.3 oz), SpO2 100 %.   Intake/Output Summary (Last 24 hours) at 08/27/16 1048 Last data filed at 08/27/16 0900  Gross per 24 hour  Intake              560 ml  Output             1500 ml  Net             -940 ml    Exam  Awake Alert, Oriented X 3, No new F.N deficits, Normal affect [place].AT,PERRAL Supple Neck,No JVD, No cervical lymphadenopathy appriciated.  Symmetrical Chest wall movement, Good air movement bilaterally, CTAB RRR,No Gallops,Rubs or new Murmurs, No Parasternal Heave +ve B.Sounds, Abd Soft, No tenderness, No organomegaly appriciated, No rebound - guarding or rigidity. No Cyanosis, Clubbing or edema, multiple scabs  R BKA, L AKA, L Arm amputated    Data Review   CBC w Diff:  Lab Results  Component Value Date   WBC 7.5 08/26/2016   HGB 10.4 (L) 08/26/2016   HGB 9.7 (L) 05/26/2012   HCT 31.7 (L) 08/26/2016   HCT 33.5 (L) 12/17/2011   PLT 126 (L) 08/26/2016   PLT 157 12/17/2011   LYMPHOPCT 17 06/13/2016   LYMPHOPCT 11.6 12/17/2011   MONOPCT 10 06/13/2016   MONOPCT 10.2 12/17/2011   EOSPCT 2 06/13/2016   EOSPCT 2.2 12/17/2011   BASOPCT 0 06/13/2016   BASOPCT 1.1 12/17/2011    CMP:  Lab Results  Component Value Date   NA 124 (L) 08/26/2016   NA 136 05/27/2012   K 3.8 08/26/2016   K 4.0 05/27/2012   CL 91 (L) 08/26/2016   CL 97 (L) 05/27/2012   CO2 24 08/26/2016   CO2 31 05/27/2012   BUN 15 08/26/2016   BUN 16 05/27/2012   CREATININE 2.87 (H) 08/26/2016   CREATININE 2.61 (H) 05/27/2012   PROT 6.5 08/24/2016   PROT 7.9 12/15/2011   ALBUMIN 2.5 (L) 08/24/2016   ALBUMIN 2.4 (L) 12/17/2011   BILITOT 1.1 08/24/2016   BILITOT 1.1 (H) 12/15/2011   ALKPHOS 161 (H) 08/24/2016   ALKPHOS 280 (H) 12/15/2011   AST 43 (H) 08/24/2016   AST 22 12/15/2011   ALT 16 08/24/2016   ALT 10 (L)  12/15/2011  .   Total Time in preparing paper work, data evaluation and todays exam - 35 minutes  Susa Raring M.D on 08/27/2016 at 10:48 AM  [facility]   Office  [phone]

## 2016-08-27 NOTE — Clinical Social Work Note (Signed)
Clinical Social Worker facilitated patient discharge including contacting patient family Pati Gallo(Brenda Gage) and facility to confirm patient discharge plans. Clinical information faxed to facility and family agreeable with plan. CSW arranged ambulance transport via [facility] to [facility]. RN to call report prior to discharge.  Clinical Social Worker will sign off for now as social work intervention is no longer needed. Please consult us again if new need arises.  Yosselin Zoeller B. Janell QuietBrown,MSW, LCSWA Clinical Social Work Dept Weekend Social Worker [phone] 1:10 PM

## 2016-08-27 NOTE — Clinical Social Work Placement (Signed)
   CLINICAL SOCIAL WORK PLACEMENT  NOTE  Date:  08/27/2016  Patient Details  Name: [name] MRN: [id number] Date of Birth: [birth year]  Clinical Social Work is seeking post-discharge placement for this patient at the Skilled  Nursing Facility level of care (*CSW will initial, date and re-position this form in  chart as items are completed):  Yes   Patient/family provided with [place] Clinical Social Work Department's list of facilities offering this level of care within the geographic area requested by the patient (or if unable, by the patient's family).  Yes   Patient/family informed of their freedom to choose among providers that offer the needed level of care, that participate in Medicare, Medicaid or managed care program needed by the patient, have an available bed and are willing to accept the patient.  Yes   Patient/family informed of [facility]'s ownership interest in [facility] and [facility], as well as of the fact that they are under no obligation to receive care at these facilities.  PASRR submitted to EDS on       PASRR number received on       Existing PASRR number confirmed on 08/27/16     FL2 transmitted to all facilities in geographic area requested by pt/family on       FL2 transmitted to all facilities within larger geographic area on       Patient informed that his/her managed care company has contracts with or will negotiate with certain facilities, including the following:            Patient/family informed of bed offers received.  Patient [facility] bed at  ([facility]     Physician recommends and patient chooses bed at      Patient to be transferred to  [facility]) on 08/27/16.  Patient to be transferred to facility by  Sh[facility] Mons([facility])     Patient family notified on 08/27/16 of transfer.  Name of family member notified:  Emergency Contact     PHYSICIAN Please sign FL2     Additional Comment:     _______________________________________________ Norlene DuelBROWN, Treasure Ochs B, LCSWA 08/27/2016, 1:12 PM

## 2016-08-27 NOTE — Discharge Instructions (Signed)
Follow with Primary MD Terrial Rhodesoladonato, Joseph, MD in 7 days   Get CBC, CMP, 2 view Chest X ray checked  by Primary MD or SNF MD in 5-7 days ( we routinely change or add medications that can affect your baseline labs and fluid status, therefore we recommend that you get the mentioned basic workup next visit with your PCP, your PCP may decide not to get them or add new tests based on their clinical decision)  Activity: As tolerated with Full fall precautions use walker/cane & assistance as needed  Disposition SNF  Diet:   DIET DYS 3  with feeding assistance and aspiration precautions.  Accuchecks 4 times/day, Once in AM empty stomach and then before each meal. Log in all results and show them to your Prim.MD in 3 days. If any glucose reading is under 80 or above 300 call your Prim MD immidiately. Follow Low glucose instructions for glucose under 80 as instructed.   For Heart failure patients - Check your Weight same time everyday, if you gain over 2 pounds, or you develop in leg swelling, experience more shortness of breath or chest pain, call your Primary MD immediately. Follow Cardiac Low Salt Diet and 1.5 lit/day fluid restriction.  On your next visit with your primary care physician please Get Medicines reviewed and adjusted.  Please request your Prim.MD to go over all Hospital Tests and Procedure/Radiological results at the follow up, please get all Hospital records sent to your Prim MD by signing hospital release before you go home.  If you experience worsening of your admission symptoms, develop shortness of breath, life threatening emergency, suicidal or homicidal thoughts you must seek medical attention immediately by calling 911 or calling your MD immediately  if symptoms less severe.  You Must read complete instructions/literature along with all the possible adverse reactions/side effects for all the Medicines you take and that have been prescribed to you. Take any new Medicines after  you have completely understood and accpet all the possible adverse reactions/side effects.   Do not drive, operate heavy machinery, perform activities at heights, swimming or participation in water activities or provide baby sitting services if your were admitted for syncope or siezures until you have seen by Primary MD or a Neurologist and advised to do so again.  Do not drive when taking Pain medications.    Do not take more than prescribed Pain, Sleep and Anxiety Medications  Special Instructions: If you have smoked or chewed Tobacco  in the last 2 yrs please stop smoking, stop any regular Alcohol  and or any Recreational drug use.  Wear Seat belts while driving.   Please note  You were cared for by a hospitalist during your hospital stay. If you have any questions about your discharge medications or the care you received while you were in the hospital after you are discharged, you can call the unit and asked to speak with the hospitalist on call if the hospitalist that took care of you is not available. Once you are discharged, your primary care physician will handle any further medical issues. Please note that NO REFILLS for any discharge medications will be authorized once you are discharged, as it is imperative that you return to your primary care physician (or establish a relationship with a primary care physician if you do not have one) for your aftercare needs so that they can reassess your need for medications and monitor your lab values.

## 2016-08-27 NOTE — Progress Notes (Signed)
[facility] Progress Note   Subjective:  "I'm doing OK." Ate 55% of breakfast. Back to baseline mental status. Very pleasant and cooperative.   Objective Vitals:   08/26/16 2106 08/26/16 2201 08/27/16 0635 08/27/16 1005  BP: (!) 84/52 (!) 107/55 (!) 132/54 (!) 156/79  Pulse: 86 82 84 86  Resp: 18  16 18   Temp: 98 F (36.7 C)  97.6 F (36.4 C) 97.8 F (36.6 C)  TempSrc: Oral  Oral Oral  SpO2: 100%  100% 100%  Weight: 50.3 kg (110 lb 14.3 oz)     Height:       Physical Exam General: chronically ill appearing pt, NAD Heart: S1,S2, RRR Lungs: CTAB A/P Abdomen: Active BS. LUQ PEG.  Extremities: R AKA, LBKA no stump edema Dialysis Access: LUA AVF + bruit   Additional Objective Labs: Basic Metabolic Panel:  Recent Labs Lab 08/25/16 0316 08/26/16 0405 08/26/16 0647 08/26/16 1341 08/26/16 1801 08/27/16 0533  NA 130* 111* 124*  --   --   --   K 3.6 3.0* 3.8  --   --   --   CL 95* 81* 91*  --   --   --   CO2 25 22 24   --   --   --   GLUCOSE 62* 581* 73  --   --   --   BUN 8 12 15   --   --   --   CREATININE 2.02* 2.53* 2.87*  --   --   --   CALCIUM 8.4* 7.4* 8.3*  --   --   --   PHOS  --   --   --  2.1* 1.6* 2.4*   Liver Function Tests:  Recent Labs Lab 08/24/16 1543  AST 43*  ALT 16  ALKPHOS 161*  BILITOT 1.1  PROT 6.5  ALBUMIN 2.5*   CBC:  Recent Labs Lab 08/23/16 0928 08/24/16 1540 08/25/16 0316 08/26/16 0405  WBC 7.5 5.9 10.6* 7.5  HGB 12.8 13.1 12.4 10.4*  HCT 39.3 39.0 37.2 31.7*  MCV 95.9 94.7 93.9 93.2  PLT 185 171 131* 126*   Blood Culture    Component Value Date/Time   SDES BLOOD RIGHT HAND 08/24/2016 1743   SPECREQUEST  08/24/2016 1743    BOTTLES DRAWN AEROBIC AND ANAEROBIC Blood Culture adequate volume   CULT PSEUDOMONAS AERUGINOSA (A) 08/24/2016 1743   REPTSTATUS 08/27/2016 FINAL 08/24/2016 1743    Cardiac Enzymes:  Recent Labs Lab 08/24/16 2118 08/25/16 0316 08/25/16 0912  TROPONINI 0.03* 0.03* <0.03    CBG:  Recent Labs Lab 08/26/16 1814 08/26/16 2116 08/27/16 0114 08/27/16 0749 08/27/16 0828  GLUCAP 95 79 78 67 81   Iron Studies: No results for input(s): IRON, TIBC, TRANSFERRIN, FERRITIN in the last 72 hours. @lablastinr3 @ Studies/Results: No results found. Medications: . cefTAZidime (FORTAZ)  IV Stopped (08/26/16 2130)   . aspirin  81 mg Per Tube Daily  . atorvastatin  10 mg Per Tube Daily  . feeding supplement (GLUCERNA SHAKE)  237 mL Oral Q6H  . feeding supplement (NEPRO CARB STEADY)  237 mL Per Tube BID WC  . feeding supplement (NEPRO CARB STEADY)  360 mL Per Tube Q2000  . free water  100 mL Per Tube Q8H  . heparin subcutaneous  5,000 Units Subcutaneous Q8H  . insulin aspart  0-9 Units Subcutaneous Q6H  . levothyroxine  25 mcg Per Tube QAC breakfast   Dialysis Orders: [facility] MWF 4 hrs 180 NRe 49 kg 400/auto 1.5  UF Profile 4 3.0 K/2.25 Ca  Heparin 2200 units IV TIW  Mircera 30 mcg IV q 2 weeks (last HGB 11.5 Ferritin 3267 Fe 44 Tsat 38% 08/17/16)  No binders/VDRA (Ca 9.3 C Ca 10.3 Phos 2.3 PTH 88 08/17/16)   Assessment/Plan: 1. AMS: Now at base line. Per primary 2. Possible Pseudomonas Bacteremia: 1/2 BC aerobe bottle only. Per primary. On Fortaz for 7-10 days per ID recommendation.  3. Hypoglycemia: Resolved. Now on TF.per priimary 2. ESRD -HD 08/26/16 on schedule. Next Tx Monday.  3. Anemia - HGB 10.4. No ESA yet.  4. Secondary hyperparathyroidism - No binders/VDRA 5. HTN/volume - BP stable. HD 08/26/16 Pre wt 51.7 kg Net UF 1.5 Post wt 50.2 kg Sl over OP EDW now. No stump edema.  6. Nutrition - Severe PCM. Dys 3 diet and TF. Peg in place-now using.  7. FTT: Ask palliative care to see pt for goals of care. Patient agrees to consult.  Disposition: Back to SNF on DC  Rita H. Brown NP-C 08/27/2016, 10:16 AM  [facility] [phone]  Pt seen, examined and agree w A/P as above.  Vinson Moselleob Maddoxx Burkitt MD [facility] pager  [phone]   08/27/2016, 1:51 PM

## 2016-08-27 NOTE — Progress Notes (Signed)
Pt CBG 67 Pt alert and responsive.  Pt asymptomatic to CBG.  Skin warm and dry.  Gave Glucerna shake po and Nephro per tube .  Will re-check CBG.

## 2016-08-27 NOTE — Progress Notes (Signed)
Call[facility] report to [facility].

## 2016-08-27 NOTE — Progress Notes (Signed)
Dr Norris Crossurner's last cardiology note reviewed. Patient signed off at that time, echo was pending. Echo returns with LVEF 30-35%, which is a new finding. Patient overall very poor conditioning with multiple advanced medical comorbidities and being treated for bacteremia. Poor candidate for invasive testing. Medical therapy limited by soft bp's. Notes mention possible consideration for palliative care involvement. Can consider low dose beta blocker and/or ACE-I if and when bp's stabilize, beyond medical therapy would not consider invasive testing at this time. Fluid removal per HD. Cardiology will follow peripherally over the weekend.     Dina RichJonathan Branch MD

## 2016-08-27 NOTE — Progress Notes (Signed)
Paged Social work pt to be discharged back to [facility].

## 2016-08-29 LAB — CULTURE, BLOOD (ROUTINE X 2)
Culture: NO GROWTH
SPECIAL REQUESTS: ADEQUATE

## 2016-09-07 ENCOUNTER — Telehealth (HOSPITAL_COMMUNITY): Payer: Self-pay | Admitting: *Deleted

## 2016-10-21 ENCOUNTER — Emergency Department (HOSPITAL_COMMUNITY)
Admission: EM | Admit: 2016-10-21 | Discharge: 2016-10-21 | Disposition: A | Discharge status: Home / Self Care | Payer: Medicare Other | Attending: Emergency Medicine | Admitting: Emergency Medicine

## 2016-10-21 ENCOUNTER — Encounter (HOSPITAL_COMMUNITY): Payer: Self-pay | Admitting: Emergency Medicine

## 2016-10-21 ENCOUNTER — Emergency Department (HOSPITAL_COMMUNITY): Payer: Medicare Other

## 2016-10-21 DIAGNOSIS — E119 Type 2 diabetes mellitus without complications: Secondary | ICD-10-CM | POA: Diagnosis not present

## 2016-10-21 DIAGNOSIS — Y998 Other external cause status: Secondary | ICD-10-CM | POA: Insufficient documentation

## 2016-10-21 DIAGNOSIS — S0990XA Unspecified injury of head, initial encounter: Secondary | ICD-10-CM | POA: Diagnosis present

## 2016-10-21 DIAGNOSIS — Z992 Dependence on renal dialysis: Secondary | ICD-10-CM | POA: Insufficient documentation

## 2016-10-21 DIAGNOSIS — Y939 Activity, unspecified: Secondary | ICD-10-CM | POA: Diagnosis not present

## 2016-10-21 DIAGNOSIS — Y9289 Other specified places as the place of occurrence of the external cause: Secondary | ICD-10-CM | POA: Insufficient documentation

## 2016-10-21 DIAGNOSIS — S0003XA Contusion of scalp, initial encounter: Secondary | ICD-10-CM | POA: Insufficient documentation

## 2016-10-21 DIAGNOSIS — Z79899 Other long term (current) drug therapy: Secondary | ICD-10-CM | POA: Insufficient documentation

## 2016-10-21 DIAGNOSIS — N186 End stage renal disease: Secondary | ICD-10-CM | POA: Insufficient documentation

## 2016-10-21 DIAGNOSIS — Z8673 Personal history of transient ischemic attack (TIA), and cerebral infarction without residual deficits: Secondary | ICD-10-CM | POA: Insufficient documentation

## 2016-10-21 DIAGNOSIS — F0390 Unspecified dementia without behavioral disturbance: Secondary | ICD-10-CM | POA: Insufficient documentation

## 2016-10-21 DIAGNOSIS — Z7982 Long term (current) use of aspirin: Secondary | ICD-10-CM | POA: Insufficient documentation

## 2016-10-21 DIAGNOSIS — W19XXXA Unspecified fall, initial encounter: Secondary | ICD-10-CM

## 2016-10-21 DIAGNOSIS — I120 Hypertensive chronic kidney disease with stage 5 chronic kidney disease or end stage renal disease: Secondary | ICD-10-CM | POA: Insufficient documentation

## 2016-10-21 DIAGNOSIS — W050XXA Fall from non-moving wheelchair, initial encounter: Secondary | ICD-10-CM | POA: Insufficient documentation

## 2016-10-21 MED ORDER — ACETAMINOPHEN 325 MG PO TABS
650.0000 mg | ORAL_TABLET | Freq: Once | ORAL | Status: AC
  Administered 2016-10-21: 650 mg via ORAL
  Filled 2016-10-21: qty 2
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
## 2016-10-21 NOTE — Discharge Instructions (Signed)
Read the information below.  You may return to the Emergency Department at any time for worsening condition or any new symptoms that concern you.    Please use ice packs on your head to help with your pain.  The swelling with slowly go down over time.    You have had a head injury which does not appear to require admission at this time. A concussion is a state of changed mental ability from trauma. SEEK IMMEDIATE MEDICAL ATTENTION IF: There is confusion or drowsiness (although children frequently become drowsy after injury).  You cannot awaken the injured person.  There is nausea (feeling sick to your stomach) or continued, forceful vomiting.  You notice dizziness or unsteadiness which is getting worse, or inability to walk.  You have convulsions or unconsciousness.  You experience severe, persistent headaches not relieved by Tylenol?. (Do not take aspirin as this impairs clotting abilities). Take other pain medications only as directed.  You cannot use arms or legs normally.  There are changes in pupil sizes. (This is the black center in the colored part of the eye)  There is clear or bloody discharge from the nose or ears.  Change in speech, vision, swallowing, or understanding.  Localized weakness, numbness, tingling, or change in bowel or bladder control.
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
## 2016-10-21 NOTE — ED Notes (Signed)
Patient transported to CT 

## 2016-10-24 ENCOUNTER — Emergency Department (HOSPITAL_COMMUNITY)
Admission: EM | Admit: 2016-10-24 | Discharge: 2016-10-24 | Disposition: A | Discharge status: Home / Self Care | Payer: Medicare Other | Attending: Emergency Medicine | Admitting: Emergency Medicine

## 2016-10-24 ENCOUNTER — Encounter (HOSPITAL_COMMUNITY): Payer: Self-pay | Admitting: Emergency Medicine

## 2016-10-24 ENCOUNTER — Emergency Department (HOSPITAL_COMMUNITY): Payer: Medicare Other

## 2016-10-24 DIAGNOSIS — Z992 Dependence on renal dialysis: Secondary | ICD-10-CM | POA: Insufficient documentation

## 2016-10-24 DIAGNOSIS — R51 Headache: Secondary | ICD-10-CM | POA: Diagnosis present

## 2016-10-24 DIAGNOSIS — Z7982 Long term (current) use of aspirin: Secondary | ICD-10-CM | POA: Insufficient documentation

## 2016-10-24 DIAGNOSIS — I120 Hypertensive chronic kidney disease with stage 5 chronic kidney disease or end stage renal disease: Secondary | ICD-10-CM | POA: Insufficient documentation

## 2016-10-24 DIAGNOSIS — W050XXA Fall from non-moving wheelchair, initial encounter: Secondary | ICD-10-CM | POA: Insufficient documentation

## 2016-10-24 DIAGNOSIS — N186 End stage renal disease: Secondary | ICD-10-CM | POA: Insufficient documentation

## 2016-10-24 DIAGNOSIS — E119 Type 2 diabetes mellitus without complications: Secondary | ICD-10-CM | POA: Diagnosis not present

## 2016-10-24 DIAGNOSIS — S0003XD Contusion of scalp, subsequent encounter: Secondary | ICD-10-CM | POA: Diagnosis not present

## 2016-10-24 DIAGNOSIS — Z79899 Other long term (current) drug therapy: Secondary | ICD-10-CM | POA: Insufficient documentation

## 2016-10-24 MED ORDER — ACETAMINOPHEN 325 MG PO TABS
650.0000 mg | ORAL_TABLET | Freq: Once | ORAL | Status: AC
  Administered 2016-10-24: 650 mg via ORAL
  Filled 2016-10-24: qty 2

## 2016-10-24 NOTE — ED Triage Notes (Signed)
Per EMS- patient just completed a 4 hour dialysis treatment. Patient stated she began having a headache during dialysis and continued post dialysis. Staff reported to EMS that the patient was slightly confused during dialysis, but alerta nd oriented now.
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
## 2016-10-24 NOTE — ED Notes (Signed)
Pt cleaned and brief changed.  Waiting for PTAR.

## 2016-10-24 NOTE — Discharge Instructions (Signed)
Read the information below.  You may return to the Emergency Department at any time for worsening condition or any new symptoms that concern you.   You have hit your head and have a painful swollen area on your scalp.  The CT of your head shows that there is no bleeding internally and your skull is not broken.    Please apply ice to the sore area regularly.  You may take tylenol as needed for pain.  Follow up with your primary care provider if you continue to have pain.

## 2016-10-24 NOTE — ED Notes (Addendum)
With pt assessment pt verbalizes headache onset with fall 6/29. Pt continues to verbalize headache worsening post dialysis treatment today. Pt denies numbness, weakness, or other symptoms. Pt denies change in normal function.

## 2016-10-24 NOTE — ED Notes (Signed)
Bed: RESB Expected date:  Expected time:  Means of arrival:  Comments: EMS dialysis and headache
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
## 2017-01-11 ENCOUNTER — Encounter (HOSPITAL_COMMUNITY): Payer: Self-pay | Admitting: *Deleted

## 2017-01-11 ENCOUNTER — Emergency Department (HOSPITAL_COMMUNITY): Payer: Medicare Other

## 2017-01-11 ENCOUNTER — Emergency Department (HOSPITAL_COMMUNITY)
Admission: EM | Admit: 2017-01-11 | Discharge: 2017-01-12 | Disposition: A | Discharge status: Home / Self Care | Payer: Medicare Other | Attending: Emergency Medicine | Admitting: Emergency Medicine

## 2017-01-11 DIAGNOSIS — Z8673 Personal history of transient ischemic attack (TIA), and cerebral infarction without residual deficits: Secondary | ICD-10-CM | POA: Diagnosis not present

## 2017-01-11 DIAGNOSIS — F0390 Unspecified dementia without behavioral disturbance: Secondary | ICD-10-CM | POA: Diagnosis not present

## 2017-01-11 DIAGNOSIS — Z885 Allergy status to narcotic agent status: Secondary | ICD-10-CM | POA: Diagnosis not present

## 2017-01-11 DIAGNOSIS — Z7982 Long term (current) use of aspirin: Secondary | ICD-10-CM | POA: Insufficient documentation

## 2017-01-11 DIAGNOSIS — I120 Hypertensive chronic kidney disease with stage 5 chronic kidney disease or end stage renal disease: Secondary | ICD-10-CM | POA: Diagnosis not present

## 2017-01-11 DIAGNOSIS — M25511 Pain in right shoulder: Secondary | ICD-10-CM | POA: Insufficient documentation

## 2017-01-11 DIAGNOSIS — Z79899 Other long term (current) drug therapy: Secondary | ICD-10-CM | POA: Insufficient documentation

## 2017-01-11 DIAGNOSIS — Z992 Dependence on renal dialysis: Secondary | ICD-10-CM | POA: Diagnosis not present

## 2017-01-11 DIAGNOSIS — Z880 Allergy status to penicillin: Secondary | ICD-10-CM | POA: Diagnosis not present

## 2017-01-11 DIAGNOSIS — E1122 Type 2 diabetes mellitus with diabetic chronic kidney disease: Secondary | ICD-10-CM | POA: Insufficient documentation

## 2017-01-11 DIAGNOSIS — N186 End stage renal disease: Secondary | ICD-10-CM | POA: Insufficient documentation

## 2017-01-11 NOTE — ED Triage Notes (Signed)
Pt was picked up from a dialysis center and lives at [facility].  Pt is complaining of right shoulder pain and numbness in hands times one month post fall one month ago. No increase in pain with movement.  9/10 pain

## 2017-01-11 NOTE — ED Triage Notes (Signed)
Pt is tender upon palpation to right shoulder and states it hurts so bad she drops things she picks up.

## 2017-01-12 DIAGNOSIS — M25511 Pain in right shoulder: Secondary | ICD-10-CM | POA: Diagnosis not present

## 2017-01-12 MED ORDER — HYDROCODONE-ACETAMINOPHEN 5-325 MG PO TABS: 1.0000 | ORAL_TABLET | Freq: Four times a day (QID) | ORAL | 0 refills | Status: AC | PRN

## 2017-01-12 NOTE — ED Notes (Signed)
Called ortho.  

## 2017-01-12 NOTE — ED Provider Notes (Signed)
[facility]-EMERGENCY DEPT Provider Note   CSN: [id number] Arrival date & time: 01/11/17  1658     History   Chief Complaint Chief Complaint  Patient presents with  . Shoulder Pain    HPI [name] is a 56 y.o. female.  Patient presents to the emergency department with chief complaint of right shoulder pain. She states that she fell on her shoulder a while ago, and states that it is been hurting for the past 2-3 months. She states that the pain is worsened with movement and especially overhead movement. She denies any fevers chills. She states that occasionally she has pain that radiates down her right arm. She denies any weakness, but states that sometimes the pain comes in waves and causes her to drop things because of the pain.She states that the pain medication that she has tried taking at home does not work for her. She denies any other associated injuries. Denies any other symptoms.   The history is provided by the patient. No language interpreter was used.    Past Medical History:  Diagnosis Date  . Anemia   . Blood transfusion   . Chronic back pain   . Dementia   . Diabetes mellitus   . Hemodialysis patient (HCC) 03/15/11   "Tues; Thurs; Sat; [facility]"  . Hypertension   . PEA (Pulseless electrical activity) (HCC)   . Peripheral vascular disease (HCC)   . Rash 03/15/11   "admitted me to find out what its' from; UE, waist, thighs, groin"  . Renal failure    hd 4/12  . Stroke [facility])     Patient Active Problem List   Diagnosis Date Noted  . Hypothermia   . Abnormal EKG   . Elevated troponin   . Hypoglycemia due to type 2 diabetes mellitus (HCC) 06/11/2015  . Pulmonary edema 06/11/2015  . S/P AKA (above knee amputation) unilateral (HCC) 05/19/2015  . 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 Depression 03/13/2015  . Prolonged Q-T interval on ECG 03/13/2015  . Type 2 diabetes mellitus, controlled, with renal  complications (HCC) 12/12/2014  . Unspecified local infection of skin and subcutaneous tissue   . Chronic anemia 12/10/2014  . Ulcer of lower limb, unspecified 01/22/2013  . Mechanical complication of other vascular device, implant, and graft 05/01/2012  . Mental status, decreased 03/19/2012  . Seizure-like activity (HCC) 03/19/2012  . Thrombocytopenia (HCC) 01/24/2012  . Acute respiratory failure with hypoxia (HCC) 01/23/2012  . HTN (hypertension) 03/01/2011  . ESRD (end stage renal disease) on dialysis (HCC) 03/01/2011  . Obesities, morbid (HCC) 03/01/2011  . Osteomyelitis (HCC) 03/01/2011  . 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 Complication of vascular access for dialysis 03/01/2011    Past Surgical History:  Procedure Laterality Date  . AMPUTATION  03/04/2011   Procedure: AMPUTATION DIGIT;  Surgeon: [name], MD;  Location[facility] OR;  Service: Orthopedics;  Laterality: Right;  RT GREAT TOE AMP  . AMPUTATION Right 03/13/2015   Procedure: RIGHT  ABOVE KNEE AMPUTATION;  Surgeon: Pryor Ochoa, MD;  Location: [facility] OR;  Service: Vascular;  Laterality: Right;  . ARTERIOVENOUS GRAFT PLACEMENT  10/01/10   left upper arm  . BELOW KNEE LEG AMPUTATION  2004   left  . CARPAL TUNNEL RELEASE  ~ 2009   left wrist  . CATARACT EXTRACTION W/ INTRAOCULAR LENS IMPLANT  12/2009   right eye  . HAND AMPUTATION Left   . INSERTION OF DIALYSIS  CATHETER  07/29/10   right subclavian  . IR FLUORO RM 30-60 MIN  08/16/2016  . IR GASTROSTOMY TUBE MOD SED  08/23/2016  . TEE WITHOUT CARDIOVERSION  03/18/2011   Procedure: TRANSESOPHAGEAL ECHOCARDIOGRAM (TEE);  Surgeon: [name], MD;  Location: [facility] ENDOSCOPY;  Service: Cardiovascular;  Laterality: N/A;    OB History    No data available       Home Medications    Prior to Admission medications   Medication Sig Start Date End Date Taking? Authorizing Provider  aspirin 81 MG chewable tablet Place 1 tablet (81 mg total) into feeding tube  daily. Patient taking differently: Chew 81 mg by mouth daily.  08/28/16   Leroy Sea, MD  atorvastatin (LIPITOR) 10 MG tablet Take 10 mg by mouth daily.    [provider]  HYDROcodone-acetaminophen (NORCO/VICODIN) 5-325 MG tablet Take 1-2 tablets by mouth every 6 (six) hours as needed. 01/12/17   Roxy Horseman, PA-C  Lactobacillus (ACIDOPHILUS PROBIOTIC) 10 MG TABS Take 10 mg by mouth 2 (two) times daily.    [provider]  levothyroxine (SYNTHROID, LEVOTHROID) 25 MCG tablet Place 1 tablet (25 mcg total) into feeding tube daily before breakfast. Patient taking differently: Take 25 mcg by mouth daily before breakfast.  08/28/16   Leroy Sea, MD  mirtazapine (REMERON) 30 MG tablet Take 30 mg by mouth at bedtime.    [provider]  Nutritional Supplements (FEEDING SUPPLEMENT, NEPRO CARB STEADY,) LIQD Place 360 mLs into feeding tube 3 (three) times daily with meals. Patient taking differently: Place into feeding tube See admin instructions. 45 ml/hr continuously via pump 08/27/16   Leroy Sea, MD  nystatin ointment (MYCOSTATIN) Apply 1 application topically See admin instructions. Apply to corners of mouth topically after meals for dry mouth    [provider]  pantoprazole (PROTONIX) 40 MG tablet Take 40 mg by mouth every evening.     [provider]  senna-docusate (SENOKOT-S) 8.6-50 MG tablet Take 2 tablets by mouth at bedtime.     [provider]  traMADol (ULTRAM) 50 MG tablet Take 1 tablet (50 mg total) by mouth every 12 (twelve) hours as needed for moderate pain. Patient taking differently: Take 50 mg by mouth every 12 (twelve) hours.  06/15/15   Clydia Llano, MD  Water For Irrigation, Sterile (FREE WATER) SOLN Place 100 mLs into feeding tube every 8 (eight) hours. 08/27/16   Leroy Sea, MD    Family History Family History  Problem Relation Age of Onset  . Diabetes Mellitus II Mother   . Stroke Mother      Social History Social History  Substance Use Topics  . Smoking status: Never Smoker  . Smokeless tobacco: Never Used  . Alcohol use No     Allergies   Codeine; Levofloxacin; and Penicillins   Review of Systems Review of Systems  All other systems reviewed and are negative.    Physical Exam Updated Vital Signs BP (!) 150/44   Pulse 71   Temp 98.3 F (36.8 C) (Oral)   Resp (!) 28   SpO2 100%   Physical Exam Nursing note and vitals reviewed.  Constitutional: Pt appears well-developed and well-nourished. No distress.  HENT:  Head: Normocephalic and atraumatic.  Eyes: Conjunctivae are normal.  Neck: Normal range of motion.  Cardiovascular: Normal rate, regular rhythm. Intact distal pulses.   Capillary refill < 3 sec.  Pulmonary/Chest: Effort normal and breath sounds normal.  Musculoskeletal:  Right shoulder  Pt exhibits tenderness to palpation in the joint lines with positive Hawkins-Kennedy test, and pain with abduction greater than 30.   ROM: 4/5 limited by pain  Strength: 4/5 limited by pain  Neurological: Pt  is alert. Coordination normal.  Sensation: 5/5 Skin: Skin is warm and dry. Pt is not diaphoretic.  No evidence of open wound or skin tenting Psychiatric: Pt has a normal mood and affect.     ED Treatments / Results  Labs (all labs ordered are listed, but only abnormal results are displayed) Labs Reviewed - No data to display  EKG  EKG Interpretation None       Radiology Dg Shoulder Right  Result Date: 01/11/2017 CLINICAL DATA:  Right shoulder pain and stiffness for 2-3 months. No injury. EXAM: RIGHT SHOULDER - 2+ VIEW COMPARISON:  11/15/2013 FINDINGS: Osteopenia of the bones. The right shoulder is located without a fracture. Alignment of the right AC joint is normal with mild degenerative changes. Visualized right ribs are intact. IMPRESSION: No acute abnormality. Electronically Signed   By: Richarda Overlie M.D.   On: 01/11/2017 18:24     Procedures Procedures (including critical care time)  Medications Ordered in ED Medications - No data to display   Initial Impression / Assessment and Plan / ED Course  I have reviewed the triage vital signs and the nursing notes.  Pertinent labs & imaging results that were available during my care of the patient were reviewed by me and considered in my medical decision making (see chart for details).     Patient X-Ray negative for obvious fracture or dislocation.  Pt advised to follow up with orthopedics. Patient given sling for comfort while in ED, conservative therapy recommended and discussed. Encouraged the patient that she needs to maintain active range of motion in her right shoulder so that she does not get frozen shoulder. Patient will be discharged home & is agreeable with above plan. Returns precautions discussed. Pt appears safe for discharge.   Final Clinical Impressions(s) / ED Diagnoses   Final diagnoses:  Acute pain of right shoulder    New Prescriptions New Prescriptions   HYDROCODONE-ACETAMINOPHEN (NORCO/VICODIN) 5-325 MG TABLET    Take 1-2 tablets by mouth every 6 (six) hours as needed.     Roxy Horseman, PA-C 01/12/17 2841    Ward, Layla Maw, DO 01/12/17 [phone]

## 2017-01-19 ENCOUNTER — Ambulatory Visit (INDEPENDENT_AMBULATORY_CARE_PROVIDER_SITE_OTHER): Payer: Medicare Other | Admitting: Orthopedic Surgery

## 2017-01-19 ENCOUNTER — Ambulatory Visit (INDEPENDENT_AMBULATORY_CARE_PROVIDER_SITE_OTHER): Payer: Self-pay

## 2017-01-19 DIAGNOSIS — G8929 Other chronic pain: Secondary | ICD-10-CM

## 2017-01-19 DIAGNOSIS — M25511 Pain in right shoulder: Secondary | ICD-10-CM | POA: Diagnosis not present

## 2017-01-19 DIAGNOSIS — M542 Cervicalgia: Secondary | ICD-10-CM | POA: Diagnosis not present

## 2017-01-19 MED ORDER — PREDNISONE 50 MG PO TABS: ORAL_TABLET | ORAL | 0 refills | Status: DC

## 2017-01-19 NOTE — Progress Notes (Signed)
Office Visit Note   Patient: [name]           Date of Birth: 01-25-1961           MRN: [id number] Visit Date: 01/19/2017              Requested by: Bonnie Rhodes, MD [address] [place], [place] [address] PCP: Bonnie Rhodes, MD  Chief Complaint  Patient presents with  . Right Shoulder - Pain      HPI: The patient is a 56 year old woman who presents a complaining of right shoulder pain that's been ongoing for about 3 months. She states the pain is aggravated by motion however she is able to reach above head behind back and across her body without any pain. She denies any open associated weakness. She also is complaining of lower neck pain as well as pain in her trapezius. Pain is posterior in the posterior shoulder. Having some intermittent numbness and shooting pain all the way down her arm. Numbness to all 4 fingers. 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 Complains of stiffness in her fingers has chronic contractures. 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  Is in a sling today.  Assessment & Plan: Visit Diagnoses:  1. Neck pain   2. Cervicalgia   3. Chronic right shoulder pain     Plan: Discontinue the sling. We will provide prescription for prednisone for the next 5 days for cervicalgia. Him will follow-up in 3 weeks if no better may consider MRI of the cervical spine.  Follow-Up Instructions: Return in about 3 weeks (around 02/09/2017).   Back Exam   Tenderness  The patient is experiencing tenderness in the cervical.  Comments:  Cervical extension painful, flexion ok. spurlings negative   Right Shoulder Exam  Right shoulder exam is normal.  Tenderness  Right shoulder tenderness location: right trapezius.  Range of Motion  The patient has normal right shoulder ROM.  Muscle Strength  The patient has normal right shoulder strength.  Tests  Cross Arm: negative Drop Arm:  negative Impingement: negative  Other  Pulse: present      Patient is alert, oriented, no adenopathy, well-dressed, normal affect, normal respiratory effort.  Report review of his cervical radiographs she has spondylosis of C 5-6 and 67 shown on a CT from February of this year. Imaging: No results found. No images are attached to the encounter.  Labs: Lab Results  Component Value Date   HGBA1C <4.2 (L) 08/24/2016   HGBA1C 6.0 (H) 06/11/2015   HGBA1C 7.1 (H) 12/13/2014   ESRSEDRATE 130 (H) 12/12/2014   ESRSEDRATE 73 (H) 12/15/2011   ESRSEDRATE 58 (H) 03/15/2011   CRP 17.9 (H) 12/12/2014   CRP 5.29 (H) 03/15/2011   REPTSTATUS 08/27/2016 FINAL 08/24/2016   GRAMSTAIN  01/24/2012    FEW WBC PRESENT,BOTH PMN AND MONONUCLEAR NO SQUAMOUS EPITHELIAL CELLS SEEN NO ORGANISMS SEEN   CULT PSEUDOMONAS AERUGINOSA (A) 08/24/2016   LABORGA PSEUDOMONAS AERUGINOSA 08/24/2016    Orders:  Orders Placed This Encounter  Procedures  . XR Cervical Spine 2 or 3 views   Meds ordered this encounter  Medications  . predniSONE (DELTASONE) 50 MG tablet    Sig: Take 1 tablet daily x 5 days.    Dispense:  5 tablet    Refill:  0     Procedures: No procedures performed  Clinical Data: No additional findings.  ROS:  All other systems negative,  except as noted in the HPI. Review of Systems  Constitutional: Negative for chills and fever.  Musculoskeletal: Positive for myalgias and neck pain.    Objective: Vital Signs: There were no vitals taken for this visit.  Specialty Comments:  No specialty comments available.  PMFS History: Patient Active Problem List   Diagnosis Date Noted  . Hypothermia   . Abnormal EKG   . Elevated troponin   . Hypoglycemia due to type 2 diabetes mellitus (HCC) 06/11/2015  . Pulmonary edema 06/11/2015  . S/P AKA (above knee amputation) unilateral (HCC) 05/19/2015  . 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 Depression  03/13/2015  . Prolonged Q-T interval on ECG 03/13/2015  . Type 2 diabetes mellitus, controlled, with renal complications (HCC) 12/12/2014  . Unspecified local infection of skin and subcutaneous tissue   . Chronic anemia 12/10/2014  . Ulcer of lower limb, unspecified 01/22/2013  . Mechanical complication of other vascular device, implant, and graft 05/01/2012  . Mental status, decreased 03/19/2012  . Seizure-like activity (HCC) 03/19/2012  . Thrombocytopenia (HCC) 01/24/2012  . Acute respiratory failure with hypoxia (HCC) 01/23/2012  . HTN (hypertension) 03/01/2011  . ESRD (end stage renal disease) on dialysis (HCC) 03/01/2011  . Obesities, morbid (HCC) 03/01/2011  . Osteomyelitis (HCC) 03/01/2011  . 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 Complication of vascular access for dialysis 03/01/2011   Past Medical History:  Diagnosis Date  . Anemia   . Blood transfusion   . Chronic back pain   . Dementia   . Diabetes mellitus   . Hemodialysis patient (HCC) 03/15/11   "Tues; Thurs; Sat; [facility]"  . Hypertension   . PEA (Pulseless electrical activity) (HCC)   . Peripheral vascular disease (HCC)   . Rash 03/15/11   "admitted me to find out what its' from; UE, waist, thighs, groin"  . Renal failure    hd 4/12  . Stroke [facility])     Family History  Problem Relation Age of Onset  . Diabetes Mellitus II Mother   . Stroke Mother     Past Surgical History:  Procedure Laterality Date  . AMPUTATION  03/04/2011   Procedure: AMPUTATION DIGIT;  Surgeon: [name], MD;  Location: [facility] OR;  Service: Orthopedics;  Laterality: Right;  RT GREAT TOE AMP  . AMPUTATION Right 03/13/2015   Procedure: RIGHT  ABOVE KNEE AMPUTATION;  Surgeon: Pryor Ochoa, MD;  Location: [facility] OR;  Service: Vascular;  Laterality: Right;  . ARTERIOVENOUS GRAFT PLACEMENT  10/01/10   left upper arm  . BELOW KNEE LEG AMPUTATION  2004   left  . CARPAL TUNNEL RELEASE  ~ 2009   left  wrist  . CATARACT EXTRACTION W/ INTRAOCULAR LENS IMPLANT  12/2009   right eye  . HAND AMPUTATION Left   . INSERTION OF DIALYSIS CATHETER  07/29/10   right subclavian  . IR FLUORO RM 30-60 MIN  08/16/2016  . IR GASTROSTOMY TUBE MOD SED  08/23/2016  . TEE WITHOUT CARDIOVERSION  03/18/2011   Procedure: TRANSESOPHAGEAL ECHOCARDIOGRAM (TEE);  Surgeon: [name], MD;  Location: [facility] ENDOSCOPY;  Service: Cardiovascular;  Laterality: N/A;   Social History   Occupational History  . Not on file.   Social History Main Topics  . Smoking status: Never Smoker  . Smokeless tobacco: Never Used  . Alcohol use No  . Drug use: No  . Sexual activity: No

## 2017-01-31 ENCOUNTER — Other Ambulatory Visit (HOSPITAL_COMMUNITY): Payer: Self-pay | Admitting: Internal Medicine

## 2017-01-31 DIAGNOSIS — R1310 Dysphagia, unspecified: Secondary | ICD-10-CM

## 2017-02-07 ENCOUNTER — Encounter (HOSPITAL_COMMUNITY): Payer: Self-pay | Admitting: Radiology

## 2017-02-07 ENCOUNTER — Ambulatory Visit (HOSPITAL_COMMUNITY)
Admission: RE | Admit: 2017-02-07 | Discharge: 2017-02-07 | Disposition: A | Discharge status: Home / Self Care | Payer: Medicare Other | Source: Ambulatory Visit | Attending: Internal Medicine | Admitting: Internal Medicine

## 2017-02-07 DIAGNOSIS — R1310 Dysphagia, unspecified: Secondary | ICD-10-CM

## 2017-02-07 DIAGNOSIS — Z431 Encounter for attention to gastrostomy: Secondary | ICD-10-CM | POA: Diagnosis present

## 2017-02-07 HISTORY — PX: IR GASTROSTOMY TUBE REMOVAL: IMG5492

## 2017-02-09 ENCOUNTER — Ambulatory Visit (INDEPENDENT_AMBULATORY_CARE_PROVIDER_SITE_OTHER): Payer: Medicare Other | Admitting: Orthopedic Surgery
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
## 2017-04-27 ENCOUNTER — Ambulatory Visit (INDEPENDENT_AMBULATORY_CARE_PROVIDER_SITE_OTHER): Payer: Medicare Other | Admitting: Ophthalmology

## 2017-04-27 ENCOUNTER — Encounter (INDEPENDENT_AMBULATORY_CARE_PROVIDER_SITE_OTHER): Payer: Self-pay | Admitting: Ophthalmology

## 2017-04-27 DIAGNOSIS — H35033 Hypertensive retinopathy, bilateral: Secondary | ICD-10-CM | POA: Diagnosis not present

## 2017-04-27 DIAGNOSIS — E113591 Type 2 diabetes mellitus with proliferative diabetic retinopathy without macular edema, right eye: Secondary | ICD-10-CM

## 2017-04-27 DIAGNOSIS — E113392 Type 2 diabetes mellitus with moderate nonproliferative diabetic retinopathy without macular edema, left eye: Secondary | ICD-10-CM | POA: Diagnosis not present

## 2017-04-27 DIAGNOSIS — H3581 Retinal edema: Secondary | ICD-10-CM | POA: Diagnosis not present

## 2017-04-27 DIAGNOSIS — H25812 Combined forms of age-related cataract, left eye: Secondary | ICD-10-CM | POA: Diagnosis not present

## 2017-04-27 DIAGNOSIS — Z961 Presence of intraocular lens: Secondary | ICD-10-CM

## 2017-04-28 ENCOUNTER — Encounter (INDEPENDENT_AMBULATORY_CARE_PROVIDER_SITE_OTHER): Payer: Self-pay | Admitting: Ophthalmology

## 2017-05-04 ENCOUNTER — Emergency Department (HOSPITAL_COMMUNITY): Payer: Medicare Other

## 2017-05-04 ENCOUNTER — Emergency Department (HOSPITAL_COMMUNITY)
Admission: EM | Admit: 2017-05-04 | Discharge: 2017-05-04 | Disposition: A | Discharge status: Home / Self Care | Payer: Medicare Other | Attending: Emergency Medicine | Admitting: Emergency Medicine

## 2017-05-04 ENCOUNTER — Encounter (HOSPITAL_COMMUNITY): Payer: Self-pay | Admitting: Emergency Medicine

## 2017-05-04 DIAGNOSIS — K5900 Constipation, unspecified: Secondary | ICD-10-CM | POA: Diagnosis not present

## 2017-05-04 DIAGNOSIS — Z79899 Other long term (current) drug therapy: Secondary | ICD-10-CM | POA: Insufficient documentation

## 2017-05-04 DIAGNOSIS — R310 Gross hematuria: Secondary | ICD-10-CM | POA: Insufficient documentation

## 2017-05-04 DIAGNOSIS — Z7982 Long term (current) use of aspirin: Secondary | ICD-10-CM | POA: Diagnosis not present

## 2017-05-04 DIAGNOSIS — E1129 Type 2 diabetes mellitus with other diabetic kidney complication: Secondary | ICD-10-CM | POA: Insufficient documentation

## 2017-05-04 DIAGNOSIS — I10 Essential (primary) hypertension: Secondary | ICD-10-CM | POA: Insufficient documentation

## 2017-05-04 DIAGNOSIS — R319 Hematuria, unspecified: Secondary | ICD-10-CM | POA: Diagnosis present

## 2017-05-04 LAB — CBC WITH DIFFERENTIAL/PLATELET
BASOS PCT: 1 %
Basophils Absolute: 0.1 10*3/uL (ref 0.0–0.1)
EOS PCT: 2 %
Eosinophils Absolute: 0.1 10*3/uL (ref 0.0–0.7)
HEMATOCRIT: 38.4 % (ref 36.0–46.0)
Hemoglobin: 12.1 g/dL (ref 12.0–15.0)
Lymphocytes Relative: 21 %
Lymphs Abs: 1 10*3/uL (ref 0.7–4.0)
MCH: 29.3 pg (ref 26.0–34.0)
MCHC: 31.5 g/dL (ref 30.0–36.0)
MCV: 93 fL (ref 78.0–100.0)
MONO ABS: 0.5 10*3/uL (ref 0.1–1.0)
MONOS PCT: 10 %
NEUTROS ABS: 3.3 10*3/uL (ref 1.7–7.7)
Neutrophils Relative %: 66 %
PLATELETS: 123 10*3/uL — AB (ref 150–400)
RBC: 4.13 MIL/uL (ref 3.87–5.11)
RDW: 14.8 % (ref 11.5–15.5)
WBC: 5 10*3/uL (ref 4.0–10.5)

## 2017-05-04 LAB — I-STAT CHEM 8, ED
BUN: 26 mg/dL — AB (ref 6–20)
Calcium, Ion: 1.07 mmol/L — ABNORMAL LOW (ref 1.15–1.40)
Chloride: 94 mmol/L — ABNORMAL LOW (ref 101–111)
Creatinine, Ser: 3.9 mg/dL — ABNORMAL HIGH (ref 0.44–1.00)
GLUCOSE: 182 mg/dL — AB (ref 65–99)
HCT: 37 % (ref 36.0–46.0)
Hemoglobin: 12.6 g/dL (ref 12.0–15.0)
POTASSIUM: 3.6 mmol/L (ref 3.5–5.1)
Sodium: 137 mmol/L (ref 135–145)
TCO2: 31 mmol/L (ref 22–32)

## 2017-05-04 LAB — URINALYSIS, ROUTINE W REFLEX MICROSCOPIC: SQUAMOUS EPITHELIAL / LPF: NONE SEEN

## 2017-05-04 LAB — POC OCCULT BLOOD, ED: Fecal Occult Bld: NEGATIVE

## 2017-05-04 LAB — COMPREHENSIVE METABOLIC PANEL
ALBUMIN: 3.6 g/dL (ref 3.5–5.0)
ALT: 10 U/L — ABNORMAL LOW (ref 14–54)
ANION GAP: 13 (ref 5–15)
AST: 19 U/L (ref 15–41)
Alkaline Phosphatase: 212 U/L — ABNORMAL HIGH (ref 38–126)
BUN: 25 mg/dL — ABNORMAL HIGH (ref 6–20)
CO2: 29 mmol/L (ref 22–32)
Calcium: 9.7 mg/dL (ref 8.9–10.3)
Chloride: 94 mmol/L — ABNORMAL LOW (ref 101–111)
Creatinine, Ser: 3.84 mg/dL — ABNORMAL HIGH (ref 0.44–1.00)
GFR calc Af Amer: 14 mL/min — ABNORMAL LOW (ref >60)
GFR calc non Af Amer: 12 mL/min — ABNORMAL LOW (ref >60)
GLUCOSE: 181 mg/dL — AB (ref 65–99)
Potassium: 3.6 mmol/L (ref 3.5–5.1)
SODIUM: 136 mmol/L (ref 135–145)
Total Bilirubin: 0.8 mg/dL (ref 0.3–1.2)
Total Protein: 7.2 g/dL (ref 6.5–8.1)

## 2017-05-04 LAB — TYPE AND SCREEN
ABO/RH(D): A POS
ANTIBODY SCREEN: NEGATIVE

## 2017-05-04 MED ORDER — SODIUM CHLORIDE 0.9 % IV BOLUS (SEPSIS): 500.0000 mL | Freq: Once | INTRAVENOUS | Status: DC

## 2017-05-04 MED ORDER — POLYETHYLENE GLYCOL 3350 17 G PO PACK: 17.0000 g | PACK | Freq: Every day | ORAL | 0 refills | Status: AC

## 2017-05-04 MED ORDER — FOSFOMYCIN TROMETHAMINE 3 G PO PACK
3.0000 g | PACK | Freq: Once | ORAL | Status: AC
  Administered 2017-05-04: 3 g via ORAL
  Filled 2017-05-04: qty 3

## 2017-05-04 MED ORDER — NITROFURANTOIN MONOHYD MACRO 100 MG PO CAPS: 100.0000 mg | ORAL_CAPSULE | Freq: Two times a day (BID) | ORAL | 0 refills | Status: DC

## 2017-05-04 NOTE — ED Triage Notes (Signed)
Per EMS, pt from [facility]. Staff noticed blood in pt's brief while changing her. Pt denies pain at this time. Pt reports pain while urinating. EMS CBG 237, 96% room air, HR 70. Pt is a dialysis pt MWF, left arm restricted.

## 2017-05-04 NOTE — ED Notes (Signed)
Patient transported to CT 

## 2017-05-04 NOTE — ED Notes (Signed)
MD aware of pt's low diastolic pressure. No recent narcotics given by facility before transport to ED. Will continue to monitor.

## 2017-05-04 NOTE — ED Notes (Signed)
Pt verbalizes understanding of d/c instructions. Pt received prescriptions. Pt waiting for PTAR for discharge.

## 2017-05-04 NOTE — ED Provider Notes (Signed)
[facility] EMERGENCY DEPARTMENT Provider Note   CSN: [id number] Arrival date & time: 05/04/17  0120     History   Chief Complaint No chief complaint on file.   HPI [name] is a 57 y.o. female.  The history is provided by the EMS personnel. The history is limited by the condition of the patient.  Hematuria  This is a new problem. The current episode started 1 to 2 hours ago. The problem occurs constantly. The problem has not changed since onset.Pertinent negatives include no chest pain, no abdominal pain and no headaches. Nothing aggravates the symptoms. Nothing relieves the symptoms. She has tried nothing for the symptoms. The treatment provided no relief.    Past Medical History:  Diagnosis Date  . Anemia   . Blood transfusion   . Chronic back pain   . Dementia   . Diabetes mellitus   . Hemodialysis patient (HCC) 03/15/11   "Tues; Thurs; Sat; [facility]  . Hypertension   . PEA (Pulseless electrical activity) (HCC)   . Peripheral vascular disease (HCC)   . Rash 03/15/11   "admitted me to find out what its' from; UE, waist, thighs, groin"  . Renal failure    hd 4/12  . Stroke [facility])     Patient Active Problem List   Diagnosis Date Noted  . Hypothermia   . Abnormal EKG   . Elevated troponin   . Hypoglycemia due to type 2 diabetes mellitus (HCC) 06/11/2015  . Pulmonary edema 06/11/2015  . S/P AKA (above knee amputation) unilateral (HCC) 05/19/2015  . 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 Depression 03/13/2015  . Prolonged Q-T interval on ECG 03/13/2015  . Type 2 diabetes mellitus, controlled, with renal complications (HCC) 12/12/2014  . Unspecified local infection of skin and subcutaneous tissue   . Chronic anemia 12/10/2014  . Ulcer of lower limb, unspecified 01/22/2013  . Mechanical complication of other vascular device, implant, and graft 05/01/2012  . Mental status, decreased  03/19/2012  . Seizure-like activity (HCC) 03/19/2012  . Thrombocytopenia (HCC) 01/24/2012  . Acute respiratory failure with hypoxia (HCC) 01/23/2012  . HTN (hypertension) 03/01/2011  . ESRD (end stage renal disease) on dialysis (HCC) 03/01/2011  . Obesities, morbid (HCC) 03/01/2011  . Osteomyelitis (HCC) 03/01/2011  . 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 Complication of vascular access for dialysis 03/01/2011    Past Surgical History:  Procedure Laterality Date  . AMPUTATION  03/04/2011   Procedure: AMPUTATION DIGIT;  Surgeon: [name], MD;  Location: [facility] OR;  Service: Orthopedics;  Laterality: Right;  RT GREAT TOE AMP  . AMPUTATION Right 03/13/2015   Procedure: RIGHT  ABOVE KNEE AMPUTATION;  Surgeon: Pryor Ochoa, MD;  Location: [facility] OR;  Service: Vascular;  Laterality: Right;  . ARTERIOVENOUS GRAFT PLACEMENT  10/01/10   left upper arm  . BELOW KNEE LEG AMPUTATION  2004   left  . CARPAL TUNNEL RELEASE  ~ 2009   left wrist  . CATARACT EXTRACTION W/ INTRAOCULAR LENS IMPLANT  12/2009   right eye  . HAND AMPUTATION Left   . INSERTION OF DIALYSIS CATHETER  07/29/10   right subclavian  . IR FLUORO RM 30-60 MIN  08/16/2016  . IR GASTROSTOMY TUBE MOD SED  08/23/2016  . IR GASTROSTOMY TUBE REMOVAL  02/07/2017  . TEE WITHOUT CARDIOVERSION  03/18/2011   Procedure: TRANSESOPHAGEAL ECHOCARDIOGRAM (TEE);  Surgeon: [name], MD;  Location: [facility] ENDOSCOPY;  Service: Cardiovascular;  Laterality: N/A;    OB History    No data available       Home Medications    Prior to Admission medications   Medication Sig Start Date End Date Taking? Authorizing Provider  aspirin 81 MG chewable tablet Place 1 tablet (81 mg total) into feeding tube daily. Patient taking differently: Chew 81 mg by mouth daily.  08/28/16   Leroy Sea, MD  atorvastatin (LIPITOR) 10 MG tablet Take 10 mg by mouth daily.    [provider]  HYDROcodone-acetaminophen (NORCO/VICODIN) 5-325 MG  tablet Take 1-2 tablets by mouth every 6 (six) hours as needed. 01/12/17   Roxy Horseman, PA-C  Lactobacillus (ACIDOPHILUS PROBIOTIC) 10 MG TABS Take 10 mg by mouth 2 (two) times daily.    [provider]  levothyroxine (SYNTHROID, LEVOTHROID) 25 MCG tablet Place 1 tablet (25 mcg total) into feeding tube daily before breakfast. Patient taking differently: Take 25 mcg by mouth daily before breakfast.  08/28/16   Leroy Sea, MD  mirtazapine (REMERON) 30 MG tablet Take 30 mg by mouth at bedtime.    [provider]  Nutritional Supplements (FEEDING SUPPLEMENT, NEPRO CARB STEADY,) LIQD Place 360 mLs into feeding tube 3 (three) times daily with meals. Patient taking differently: Place into feeding tube See admin instructions. 45 ml/hr continuously via pump 08/27/16   Leroy Sea, MD  nystatin ointment (MYCOSTATIN) Apply 1 application topically See admin instructions. Apply to corners of mouth topically after meals for dry mouth    [provider]  pantoprazole (PROTONIX) 40 MG tablet Take 40 mg by mouth every evening.     [provider]  predniSONE (DELTASONE) 50 MG tablet Take 1 tablet daily x 5 days. 01/19/17   Adonis Huguenin, NP  senna-docusate (SENOKOT-S) 8.6-50 MG tablet Take 2 tablets by mouth at bedtime.     [provider]  traMADol (ULTRAM) 50 MG tablet Take 1 tablet (50 mg total) by mouth every 12 (twelve) hours as needed for moderate pain. Patient taking differently: Take 50 mg by mouth every 12 (twelve) hours.  06/15/15   Clydia Llano, MD  Water For Irrigation, Sterile (FREE WATER) SOLN Place 100 mLs into feeding tube every 8 (eight) hours. 08/27/16   Leroy Sea, MD    Family History Family History  Problem Relation Age of Onset  . Diabetes Mellitus II Mother   . Stroke Mother     Social History Social History   Tobacco Use  . Smoking status: Never Smoker  . Smokeless tobacco: Never Used  Substance Use Topics  . Alcohol  use: No  . Drug use: No     Allergies   Codeine; Levofloxacin; and Penicillins   Review of Systems Review of Systems  Unable to perform ROS: Dementia  Cardiovascular: Negative for chest pain.  Gastrointestinal: Negative for abdominal pain.  Genitourinary: Positive for hematuria.  Neurological: Negative for headaches.     Physical Exam Updated Vital Signs BP (!) 115/44 (BP Location: Right Arm)   Pulse 77   Temp 98.6 F (37 C) (Oral)   Resp 16   Ht 5\' 4"  (1.626 m)   SpO2 98%   BMI 19.03 kg/m   Physical Exam  Constitutional: She is oriented to person, place, and time. She appears well-developed and well-nourished. No distress.  HENT:  Head: Normocephalic and atraumatic.  Nose: Nose normal.  Mouth/Throat: Oropharynx is clear and moist. No oropharyngeal exudate.  Eyes: Conjunctivae are normal. Pupils  are equal, round, and reactive to light.  Neck: Normal range of motion. Neck supple.  Cardiovascular: Normal rate, regular rhythm, normal heart sounds and intact distal pulses.  Pulmonary/Chest: Effort normal and breath sounds normal. No stridor. She has no wheezes. She has no rales.  Abdominal: Soft. Bowel sounds are normal. She exhibits no mass. There is no tenderness. There is no rebound and no guarding.  Musculoskeletal: Normal range of motion.  Neurological: She is alert and oriented to person, place, and time. She displays normal reflexes.  Skin: Skin is warm and dry. Capillary refill takes less than 2 seconds.  Psychiatric: She has a normal mood and affect.     ED Treatments / Results  Labs (all labs ordered are listed, but only abnormal results are displayed) Results for orders placed or performed during the hospital encounter of 05/04/17  CBC with Differential/Platelet  Result Value Ref Range   WBC 5.0 4.0 - 10.5 K/uL   RBC 4.13 3.87 - 5.11 MIL/uL   Hemoglobin 12.1 12.0 - 15.0 g/dL   HCT 96.038.4 45.436.0 - 09.846.0 %   MCV 93.0 78.0 - 100.0 fL   MCH 29.3 26.0 - 34.0  pg   MCHC 31.5 30.0 - 36.0 g/dL   RDW 11.914.8 14.711.5 - 82.915.5 %   Platelets 123 (L) 150 - 400 K/uL   Neutrophils Relative % 66 %   Neutro Abs 3.3 1.7 - 7.7 K/uL   Lymphocytes Relative 21 %   Lymphs Abs 1.0 0.7 - 4.0 K/uL   Monocytes Relative 10 %   Monocytes Absolute 0.5 0.1 - 1.0 K/uL   Eosinophils Relative 2 %   Eosinophils Absolute 0.1 0.0 - 0.7 K/uL   Basophils Relative 1 %   Basophils Absolute 0.1 0.0 - 0.1 K/uL  POC occult blood, ED  Result Value Ref Range   Fecal Occult Bld NEGATIVE NEGATIVE  I-Stat Chem 8, ED  Result Value Ref Range   Sodium 137 135 - 145 mmol/L   Potassium 3.6 3.5 - 5.1 mmol/L   Chloride 94 (L) 101 - 111 mmol/L   BUN 26 (H) 6 - 20 mg/dL   Creatinine, Ser 5.623.90 (H) 0.44 - 1.00 mg/dL   Glucose, Bld 130182 (H) 65 - 99 mg/dL   Calcium, Ion 8.651.07 (L) 1.15 - 1.40 mmol/L   TCO2 31 22 - 32 mmol/L   Hemoglobin 12.6 12.0 - 15.0 g/dL   HCT 78.437.0 69.636.0 - 29.546.0 %  Type and screen  Result Value Ref Range   ABO/RH(D) A POS    Antibody Screen NEG    Sample Expiration 05/07/2017    No results found.  Radiology No results found.  Procedures Procedures (including critical care time)  Medications Ordered in ED Medications  sodium chloride 0.9 % bolus 500 mL (not administered)      Final Clinical Impressions(s) / ED Diagnoses  Follow up with urology for ongoing hematuria, will treat as hemorrhagic cystitis. As patient is on dialysis with multiple allergies macrobid is a an option.   Start miralax therapy daily for constipation. Patient is passing soft stool and the room.    Return for worsening pain, fevers > 100.4 unrelieved by medication, shortness of breath, intractable vomiting, or diarrhea, abdominal pain, Inability to tolerate liquids or food, cough, altered mental status or any concerns. No signs of systemic illness or infection. The patient is nontoxic-appearing on exam and vital signs are within normal limits.    I have reviewed the triage vital signs and the  nursing notes. Pertinent labs &imaging results that were available during my care of the patient were reviewed by me and considered in my medical decision making (see chart for details).  After history, exam, and medical workup I feel the patient has been appropriately medically screened and is safe for discharge home. Pertinent diagnoses were discussed with the patient. Patient was given return precautions.       Maryana Pittmon, MD 05/04/17 [phone]

## 2017-05-04 NOTE — ED Notes (Signed)
ED Provider at bedside. 

## 2017-05-10 NOTE — Progress Notes (Signed)
[facility] - Clinic Note  05/11/2017     CHIEF COMPLAINT Patient presents for Retina Follow Up   HISTORY OF PRESENT ILLNESS: [name] is a 57 y.o. female who presents to the clinic today for:   HPI    Retina Follow Up    Patient presents with  Diabetic Retinopathy.  In both eyes.  Severity is moderate.  Duration of 2 weeks.  Since onset it is stable.  I, the attending physician,  performed the HPI with the patient and updated documentation appropriately.          Comments    Pt presents today for F/U for PDR OD/NPDR OS, pt states OD has been bothering her, states she has been told she has a cataract in that eye, pt states she is seeing floaters, but denies flashes, pain or wavy vision, pt denies the use gtts, pt did not check BS this AM, pt states she is okay to have laser done today if needed, pt has dialysis on M, W, F,        Last edited by Rennis Chris, MD on 05/11/2017  2:02 PM. (History)       Referring physician: Terrial Rhodes, MD [address] [place], [place] [address]  HISTORICAL INFORMATION:   Selected notes from the MEDICAL RECORD NUMBER Referred by Dr. Hansel Feinstein (Nephrologist) for DM exam;  LEE-  Ocular Hx-  PMH- DM    CURRENT MEDICATIONS: Current Outpatient Medications (Ophthalmic Drugs)  Medication Sig  . prednisoLONE acetate (PRED FORTE) 1 % ophthalmic suspension Place 1 drop into the right eye 4 (four) times daily.  Marland Kitchen Propylene Glycol-Glycerin (ARTIFICIAL TEARS) 1-0.3 % SOLN Place 1 drop into both eyes every 12 (twelve) hours as needed (for eye iritation).   No current facility-administered medications for this visit.  (Ophthalmic Drugs)   Current Outpatient Medications (Other)  Medication Sig  . aspirin 81 MG chewable tablet Place 1 tablet (81 mg total) into feeding tube daily. (Patient taking differently: Chew 81 mg by mouth daily. )  . atorvastatin (LIPITOR) 10 MG tablet Take 10 mg by mouth daily.  .  Cholecalciferol (VITAMIN D-3) 5000 units TABS Take 5,000 Units by mouth daily.  Marland Kitchen gabapentin (NEURONTIN) 100 MG capsule Take 100 mg by mouth at bedtime.  Marland Kitchen glucagon (GLUCAGON EMERGENCY) 1 MG injection Inject 1 mg into the vein once as needed (for blood sugar).  Marland Kitchen glycerin adult 2 g suppository Place 1 suppository rectally as needed for constipation.  Marland Kitchen guaiFENesin (ROBITUSSIN) 100 MG/5ML SOLN Take 20 mLs by mouth every 8 (eight) hours as needed for cough or to loosen phlegm.  Marland Kitchen HYDROcodone-acetaminophen (NORCO/VICODIN) 5-325 MG tablet Take 1-2 tablets by mouth every 6 (six) hours as needed.  . hydrocortisone cream 1 % Apply 1 application topically every 8 (eight) hours as needed for itching.  . Infant Care Products (DERMACLOUD) CREA Apply topically 3 (three) times daily. To buttocks  . insulin aspart (NOVOLOG) 100 UNIT/ML injection Inject 2-10 Units into the skin 3 (three) times daily before meals. Per sliding scale 0-59: notify doctor 60-100= 0 units 101-199 = 2 units 200-299 = 6 units 300-349 = 8 units 350-399 = 10 units 400-100 = Notify Doctor  . lamoTRIgine (LAMICTAL) 25 MG tablet Take 25 mg by mouth at bedtime.  Marland Kitchen levothyroxine (SYNTHROID, LEVOTHROID) 25 MCG tablet Place 1 tablet (25 mcg total) into feeding tube daily before breakfast. (Patient taking differently: Take 25 mcg by mouth daily before breakfast. )  .  Melatonin-Pyridoxine 3-1 MG TABS Take 1 tablet by mouth at bedtime.  . mirtazapine (REMERON) 30 MG tablet Take 30 mg by mouth at bedtime.  Marland Kitchen. neomycin-bacitracin-polymyxin (NEOSPORIN) ointment Apply 1 application topically daily.  . nitrofurantoin, macrocrystal-monohydrate, (MACROBID) 100 MG capsule Take 1 capsule (100 mg total) by mouth 2 (two) times daily. X 7 days  . Nutritional Supplements (FEEDING SUPPLEMENT, NEPRO CARB STEADY,) LIQD Place 360 mLs into feeding tube 3 (three) times daily with meals. (Patient not taking: Reported on 05/04/2017)  . ondansetron (ZOFRAN) 4 MG  tablet Take 4 mg by mouth every 8 (eight) hours as needed for nausea or vomiting.  . pantoprazole (PROTONIX) 40 MG tablet Take 40 mg by mouth every evening.   . polyethylene glycol (MIRALAX / GLYCOLAX) packet Take 17 g by mouth daily.  . polyethylene glycol (MIRALAX) packet Take 17 g by mouth daily.  . predniSONE (DELTASONE) 50 MG tablet Take 1 tablet daily x 5 days. (Patient not taking: Reported on 05/04/2017)  . Probiotic Product (PROBIOTIC PO) Take 250 mg by mouth 2 (two) times daily.  . promethazine (PHENERGAN) 6.25 MG/5ML syrup Take 12.5 mg by mouth every 6 (six) hours as needed for nausea or vomiting.  . senna-docusate (SENOKOT-S) 8.6-50 MG tablet Take 2 tablets by mouth at bedtime.   . sevelamer carbonate (RENVELA) 800 MG tablet Take 400 mg by mouth 3 (three) times daily with meals.  . sucralfate (CARAFATE) 1 g tablet Take 1 g by mouth 4 (four) times daily.  . traMADol (ULTRAM) 50 MG tablet Take 1 tablet (50 mg total) by mouth every 12 (twelve) hours as needed for moderate pain. (Patient taking differently: Take 50 mg by mouth every 8 (eight) hours as needed for moderate pain. )  . Water For Irrigation, Sterile (FREE WATER) SOLN Place 100 mLs into feeding tube every 8 (eight) hours. (Patient not taking: Reported on 05/04/2017)   No current facility-administered medications for this visit.  (Other)      REVIEW OF SYSTEMS: ROS    Positive for: Neurological, Endocrine, Eyes   Negative for: Constitutional, Gastrointestinal, Skin, Genitourinary, Musculoskeletal, HENT, Cardiovascular, Respiratory, Psychiatric, Allergic/Imm, Heme/Lymph   Last edited by Posey BoyerBrown, Amanda J, COT on 05/11/2017  8:29 AM. (History)       ALLERGIES Allergies  Allergen Reactions  . Codeine Hives  . Levofloxacin Other (See Comments)    Unknown, but noted on MAR  . Penicillins Hives    Has patient had a PCN reaction causing immediate rash, facial/tongue/throat swelling, SOB or lightheadedness with hypotension:  Yes Has patient had a PCN reaction causing severe rash involving mucus membranes or skin necrosis: Unk Has patient had a PCN reaction that required hospitalization: Unk Has patient had a PCN reaction occurring within the last 10 years: Unk If all of the above answers are "NO", then may proceed with Cephalosporin use.     PAST MEDICAL HISTORY Past Medical History:  Diagnosis Date  . Anemia   . Blood transfusion   . Chronic back pain   . Dementia   . Diabetes mellitus   . Hemodialysis patient (HCC) 03/15/11   "Tues; Thurs; [facility]"  . Hypertension   . PEA (Pulseless electrical activity) (HCC)   . Peripheral vascular disease (HCC)   . Rash 03/15/11   "admitted me to find out what its' from; UE, waist, thighs, groin"  . Renal failure    hd 4/12  . Stroke [facility](HCC)    Past Surgical History:  Procedure Laterality  Date  . AMPUTATION  03/04/2011   Procedure: AMPUTATION DIGIT;  Surgeon: [name], MD;  Location: [facility] OR;  Service: Orthopedics;  Laterality: Right;  RT GREAT TOE AMP  . AMPUTATION Right 03/13/2015   Procedure: RIGHT  ABOVE KNEE AMPUTATION;  Surgeon: Pryor Ochoa, MD;  Location: [facility] OR;  Service: Vascular;  Laterality: Right;  . ARTERIOVENOUS GRAFT PLACEMENT  10/01/10   left upper arm  . BELOW KNEE LEG AMPUTATION  2004   left  . CARPAL TUNNEL RELEASE  ~ 2009   left wrist  . CATARACT EXTRACTION W/ INTRAOCULAR LENS IMPLANT  12/2009   right eye  . HAND AMPUTATION Left   . INSERTION OF DIALYSIS CATHETER  07/29/10   right subclavian  . IR FLUORO RM 30-60 MIN  08/16/2016  . IR GASTROSTOMY TUBE MOD SED  08/23/2016  . IR GASTROSTOMY TUBE REMOVAL  02/07/2017  . TEE WITHOUT CARDIOVERSION  03/18/2011   Procedure: TRANSESOPHAGEAL ECHOCARDIOGRAM (TEE);  Surgeon: [name], MD;  Location: [facility] ENDOSCOPY;  Service: Cardiovascular;  Laterality: N/A;    FAMILY HISTORY Family History  Problem Relation Age of Onset  . Diabetes Mellitus II Mother   . Stroke  Mother     SOCIAL HISTORY Social History   Tobacco Use  . Smoking status: Never Smoker  . Smokeless tobacco: Never Used  Substance Use Topics  . Alcohol use: No  . Drug use: No         OPHTHALMIC EXAM:  Base Eye Exam    Visual Acuity (Snellen - Linear)      Right Left   Dist [place] 20/50 20/50   Dist ph [place] NI NI       Tonometry (Tonopen, 8:35 AM)      Right Left   Pressure 16 13       Pupils      Dark Light Shape React APD   Right 3 2 Round Sluggish None   Left 3 2 Round Sluggish None       Visual Fields (Counting fingers)      Left Right    Full Full       Extraocular Movement      Right Left    Full, Ortho Full, Ortho       Neuro/Psych    Oriented x3:  Yes   Mood/Affect:  Normal       Dilation    Both eyes:  1.0% Mydriacyl, 2.5% Phenylephrine @ 8:35 AM        Slit Lamp and Fundus Exam    External Exam      Right Left   External wheelchair bound; bilateral lower limb amputee wheelchair bound; bilateral lower limb amputee       Slit Lamp Exam      Right Left   Lids/Lashes Dermatochalasis - upper lid, Telangiectasia of upper lid Dermatochalasis - upper lid, Telangiectasia of upper lid   Conjunctiva/Sclera Nasal Pinguecula Nasal and temproal Pinguecula   Cornea Clear Clear   Anterior Chamber Deep and quiet moderatre depth   Iris Round and dilated, No NVI Round and dilated, No NVI   Lens  PC IOL in good position, Mild anterior capsule fimosis 2+ Nuclear sclerosis, 2+ Cortical cataract   Vitreous Vitreous syneresis Vitreous syneresis       Fundus Exam      Right Left   Disc peripapillary pigmentation temporally, fibrosis overlying disc, +NVD Normal   C/D Ratio obscured by fibrosis 0.4   Macula Flat, ERM,  scattered MAs; no edema Flat, scattered MAs or edema   Vessels Vascular attenuation; peripheral sclerosis Vascular attenuation; peripheral sclerosis   Periphery large pre-retinal hemorrhage inferiorly at 0700, 360 scattered dot hemes attached;  few MAs          IMAGING AND PROCEDURES  Imaging and Procedures for 05/11/17  OCT, Retina - OU - Both Eyes     Right Eye Quality was borderline. Central Foveal Thickness: 231. Progression has been stable. Findings include normal foveal contour, no IRF, no SRF, epiretinal membrane.   Left Eye Quality was borderline. Central Foveal Thickness: 243. Progression has no prior data. Findings include normal foveal contour, epiretinal membrane, no IRF, no SRF (Diffuse atrophy temporally ).   Notes *Images captured and stored on drive  Diagnosis / Impression:  No DME OU  Clinical management:  See below  Abbreviations: NFP - Normal foveal profile. CME - cystoid macular edema. PED - pigment epithelial detachment. IRF - intraretinal fluid. SRF - subretinal fluid. EZ - ellipsoid zone. ERM - epiretinal membrane. ORA - outer retinal atrophy. ORT - outer retinal tubulation. SRHM - subretinal hyper-reflective material         Fluorescein Angiography Optos (Transit OD)     Right Eye Progression has no prior data. Early phase findings include neovascularization disc, retinal neovascularization, vascular perfusion defect, blockage, microaneurysm. Mid/Late phase findings include neovascularization disc, retinal neovascularization, vascular perfusion defect, leakage, blockage, microaneurysm.   Left Eye Progression has no prior data. Early phase findings include vascular perfusion defect, microaneurysm, retinal neovascularization. Mid/Late phase findings include vascular perfusion defect, microaneurysm, retinal neovascularization.   Notes Impression:  NVE OU (OD > OS) Scattered patches of peripheral nonperfusion OU Microaneurysms w/ leakage OU       Panretinal Photocoagulation - OD - Right Eye     LASER PROCEDURE NOTE  Diagnosis:   Proliferative Diabetic Retinopathy, RIGHT EYE  Procedure:  Pan-retinal photocoagulation using slit lamp laser, RIGHT EYE  Anesthesia:   Topical  Surgeon: Rennis Chris, MD, PhD   Informed consent obtained, operative eye marked, and time out performed prior to initiation of laser.   Lumenis [id number] Laser Indirect Ophthalmoscope Power: 300 mW Duration: 100 msec   # spots: 996 spots 360  Complications: None.  RTC: 2 wks for PRP, contralateral eye  Patient tolerated the procedure well and received written and verbal post-procedure care information/education.                 ASSESSMENT/PLAN:    ICD-10-CM   1. 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 Retinal edema H35.81   4. Essential hypertension I10   5. Hypertensive retinopathy of both eyes H35.033 Fluorescein Angiography Optos (Transit OD)  6. Pseudophakia, right eye Z96.1   7. 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 Proliferative diabetic retinopathy w/o macular edema, both eyes The incidence, risk factors for progression, natural history and treatment options for diabetic retinopathy  were discussed with patient.  The need for close monitoring of blood glucose, blood pressure, and serum lipids, avoiding cigarette or any type of tobacco, and the need for long term follow up was also discussed with patient. - early fibrosis overlying disc OD with fine NVD - large preretinal hemorrhage inferiorly ?ruptured RAM? -  FA today - OCT without diabetic macular edema, both eyes - recommend PRP OU, OD first - RBA of procedure discussed, questions answered - informed consent obtained and signed by POA - see procedure note - pt tolerated well - start PF QID OD x7 days - f/u in 2 wks for PRP OS  4,5. Hypertensive retinopathy OU - discussed importance of tight BP control -  monitor  6. Pseudophakia OD  - s/p CE/IOL OD -- pt unable to recall surgeon  - stable, doing well  - monitor  7. Nuclear and cortical cataract OS - The symptoms of cataract, surgical options, and treatments and risks were discussed with patient. - discussed diagnosis and progression - approaching visual significance - monitor for now   Ophthalmic Meds Ordered this visit:  Meds ordered this encounter  Medications  . prednisoLONE acetate (PRED FORTE) 1 % ophthalmic suspension    Sig: Place 1 drop into the right eye 4 (four) times daily.    Dispense:  10 mL    Refill:  0       Return in about 2 weeks (around 05/25/2017) for Dilated Exam, Laser.  There are no Patient Instructions on file for this visit.   Explained the diagnoses, plan, and follow up with the patient and they expressed understanding.  Patient expressed understanding of the importance of proper follow up care.   This document serves as a record of services personally performed by Karie Chimera, MD, PhD. It was created on their behalf by Virgilio Belling, COA, a certified ophthalmic assistant. The creation of this record is the provider's dictation and/or activities during the visit.  Electronically signed by: Virgilio Belling, COA  05/11/17 2:09 PM    Karie Chimera, M.D., Ph.D. Diseases & Surgery of the Retina and Vitreous [facility] 05/11/17  I have reviewed the above documentation for accuracy and completeness, and I agree with the above. Karie Chimera, M.D., Ph.D. 05/11/17 2:09 PM    Abbreviations: M myopia (nearsighted); A astigmatism; H hyperopia (farsighted); P presbyopia; Mrx spectacle prescription;  CTL contact lenses; OD right eye; OS left eye; OU both eyes  XT exotropia; ET esotropia; PEK punctate epithelial keratitis; PEE punctate epithelial erosions; DES dry eye syndrome; MGD meibomian gland dysfunction; ATs artificial tears; PFAT's preservative free artificial tears;  NSC nuclear sclerotic cataract; PSC posterior subcapsular cataract; ERM epi-retinal membrane; PVD posterior vitreous detachment; RD retinal detachment; DM diabetes mellitus; DR diabetic retinopathy; NPDR non-proliferative diabetic retinopathy; PDR proliferative diabetic retinopathy; CSME clinically significant macular edema; DME diabetic macular edema; dbh dot blot hemorrhages; CWS cotton wool spot; POAG primary open angle glaucoma; C/D cup-to-disc ratio; HVF humphrey visual field; GVF goldmann visual field; OCT optical coherence tomography; IOP intraocular pressure; BRVO Branch retinal vein occlusion; CRVO central retinal vein occlusion; CRAO central retinal artery occlusion; BRAO branch retinal artery occlusion; RT retinal tear; SB scleral buckle; PPV pars plana vitrectomy; VH Vitreous hemorrhage; PRP panretinal laser photocoagulation; IVK intravitreal kenalog; VMT vitreomacular traction; MH Macular hole;  NVD neovascularization of the disc; NVE neovascularization elsewhere; AREDS age related eye disease study; ARMD age related macular degeneration; POAG primary open angle glaucoma; EBMD epithelial/anterior basement membrane dystrophy; ACIOL anterior chamber intraocular lens; IOL intraocular lens; PCIOL posterior chamber intraocular lens; Phaco/IOL phacoemulsification with intraocular lens placement; PRK photorefractive keratectomy; LASIK laser assisted in situ keratomileusis; HTN hypertension; DM diabetes mellitus; COPD chronic obstructive pulmonary disease

## 2017-05-11 ENCOUNTER — Encounter (INDEPENDENT_AMBULATORY_CARE_PROVIDER_SITE_OTHER): Payer: Self-pay | Admitting: Ophthalmology

## 2017-05-11 ENCOUNTER — Ambulatory Visit (INDEPENDENT_AMBULATORY_CARE_PROVIDER_SITE_OTHER): Payer: Medicare Other | Admitting: Ophthalmology

## 2017-05-11 DIAGNOSIS — H35049 Retinal micro-aneurysms, unspecified, unspecified eye: Secondary | ICD-10-CM

## 2017-05-11 DIAGNOSIS — H35033 Hypertensive retinopathy, bilateral: Secondary | ICD-10-CM

## 2017-05-11 DIAGNOSIS — E11319 Type 2 diabetes mellitus with unspecified diabetic retinopathy without macular edema: Secondary | ICD-10-CM

## 2017-05-11 DIAGNOSIS — I10 Essential (primary) hypertension: Secondary | ICD-10-CM

## 2017-05-11 DIAGNOSIS — H3581 Retinal edema: Secondary | ICD-10-CM | POA: Diagnosis not present

## 2017-05-11 DIAGNOSIS — E113593 Type 2 diabetes mellitus with proliferative diabetic retinopathy without macular edema, bilateral: Secondary | ICD-10-CM | POA: Diagnosis not present

## 2017-05-11 DIAGNOSIS — Z961 Presence of intraocular lens: Secondary | ICD-10-CM

## 2017-05-11 DIAGNOSIS — H25812 Combined forms of age-related cataract, left eye: Secondary | ICD-10-CM

## 2017-05-11 MED ORDER — PREDNISOLONE ACETATE 1 % OP SUSP: 1.0000 [drp] | Freq: Four times a day (QID) | OPHTHALMIC | 0 refills | Status: AC

## 2017-05-22 NOTE — Progress Notes (Signed)
[facility] - Clinic Note  05/25/2017     CHIEF COMPLAINT Patient presents for Retina Follow Up   HISTORY OF PRESENT ILLNESS: [name] is a 57 y.o. female who presents to the clinic today for:   HPI    Retina Follow Up    Patient presents with  Diabetic Retinopathy.  In both eyes.  This started 2 months ago.  Severity is mild.  Since onset it is gradually improving.  I, the attending physician,  performed the HPI with the patient and updated documentation appropriately.          Comments    F/U PDR OU. Patient states her" vision is getting better. ,but I still have that dot in my right eye". Denies pain, glare and wavy vision. Bs unsure of results this am ( Pt did not receive any coverage) A1C unsure of results.       Last edited by Rennis Chris, MD on 05/25/2017  8:44 AM. (History)       Referring physician: Terrial Rhodes, MD [address] [place], [place] [address]  HISTORICAL INFORMATION:   Selected notes from the MEDICAL RECORD NUMBER Referred by Dr. Hansel Feinstein (Nephrologist) for DM exam;  LEE-  Ocular Hx-  PMH- DM    CURRENT MEDICATIONS: Current Outpatient Medications (Ophthalmic Drugs)  Medication Sig  . prednisoLONE acetate (PRED FORTE) 1 % ophthalmic suspension Place 1 drop into the right eye 4 (four) times daily.  Marland Kitchen Propylene Glycol-Glycerin (ARTIFICIAL TEARS) 1-0.3 % SOLN Place 1 drop into both eyes every 12 (twelve) hours as needed (for eye iritation).   No current facility-administered medications for this visit.  (Ophthalmic Drugs)   Current Outpatient Medications (Other)  Medication Sig  . aspirin 81 MG chewable tablet Place 1 tablet (81 mg total) into feeding tube daily. (Patient taking differently: Chew 81 mg by mouth daily. )  . atorvastatin (LIPITOR) 10 MG tablet Take 10 mg by mouth daily.  . Cholecalciferol (VITAMIN D-3) 5000 units TABS Take 5,000 Units by mouth daily.  Marland Kitchen gabapentin (NEURONTIN) 100 MG capsule Take  100 mg by mouth at bedtime.  Marland Kitchen glucagon (GLUCAGON EMERGENCY) 1 MG injection Inject 1 mg into the vein once as needed (for blood sugar).  Marland Kitchen glycerin adult 2 g suppository Place 1 suppository rectally as needed for constipation.  Marland Kitchen guaiFENesin (ROBITUSSIN) 100 MG/5ML SOLN Take 20 mLs by mouth every 8 (eight) hours as needed for cough or to loosen phlegm.  Marland Kitchen HYDROcodone-acetaminophen (NORCO/VICODIN) 5-325 MG tablet Take 1-2 tablets by mouth every 6 (six) hours as needed.  . hydrocortisone cream 1 % Apply 1 application topically every 8 (eight) hours as needed for itching.  . Infant Care Products (DERMACLOUD) CREA Apply topically 3 (three) times daily. To buttocks  . insulin aspart (NOVOLOG) 100 UNIT/ML injection Inject 2-10 Units into the skin 3 (three) times daily before meals. Per sliding scale 0-59: notify doctor 60-100= 0 units 101-199 = 2 units 200-299 = 6 units 300-349 = 8 units 350-399 = 10 units 400-100 = Notify Doctor  . lamoTRIgine (LAMICTAL) 25 MG tablet Take 25 mg by mouth at bedtime.  Marland Kitchen levothyroxine (SYNTHROID, LEVOTHROID) 25 MCG tablet Place 1 tablet (25 mcg total) into feeding tube daily before breakfast. (Patient taking differently: Take 25 mcg by mouth daily before breakfast. )  . Melatonin-Pyridoxine 3-1 MG TABS Take 1 tablet by mouth at bedtime.  . mirtazapine (REMERON) 30 MG tablet Take 30 mg by mouth at bedtime.  Marland Kitchen  neomycin-bacitracin-polymyxin (NEOSPORIN) ointment Apply 1 application topically daily.  . nitrofurantoin, macrocrystal-monohydrate, (MACROBID) 100 MG capsule Take 1 capsule (100 mg total) by mouth 2 (two) times daily. X 7 days  . Nutritional Supplements (FEEDING SUPPLEMENT, NEPRO CARB STEADY,) LIQD Place 360 mLs into feeding tube 3 (three) times daily with meals.  . ondansetron (ZOFRAN) 4 MG tablet Take 4 mg by mouth every 8 (eight) hours as needed for nausea or vomiting.  . pantoprazole (PROTONIX) 40 MG tablet Take 40 mg by mouth every evening.   . polyethylene  glycol (MIRALAX / GLYCOLAX) packet Take 17 g by mouth daily.  . polyethylene glycol (MIRALAX) packet Take 17 g by mouth daily.  . predniSONE (DELTASONE) 50 MG tablet Take 1 tablet daily x 5 days.  . Probiotic Product (PROBIOTIC PO) Take 250 mg by mouth 2 (two) times daily.  . promethazine (PHENERGAN) 6.25 MG/5ML syrup Take 12.5 mg by mouth every 6 (six) hours as needed for nausea or vomiting.  . senna-docusate (SENOKOT-S) 8.6-50 MG tablet Take 2 tablets by mouth at bedtime.   . sevelamer carbonate (RENVELA) 800 MG tablet Take 400 mg by mouth 3 (three) times daily with meals.  . sucralfate (CARAFATE) 1 g tablet Take 1 g by mouth 4 (four) times daily.  . traMADol (ULTRAM) 50 MG tablet Take 1 tablet (50 mg total) by mouth every 12 (twelve) hours as needed for moderate pain. (Patient taking differently: Take 50 mg by mouth every 8 (eight) hours as needed for moderate pain. )  . Water For Irrigation, Sterile (FREE WATER) SOLN Place 100 mLs into feeding tube every 8 (eight) hours.   No current facility-administered medications for this visit.  (Other)      REVIEW OF SYSTEMS: ROS    Positive for: Constitutional, Neurological, Genitourinary, Musculoskeletal, HENT, Endocrine, Cardiovascular, Eyes, Respiratory, Psychiatric   Negative for: Gastrointestinal, Skin, Allergic/Imm, Heme/Lymph   Last edited by Eldridge ScotKendrick, Glenda, LPN on 1/61/09601/31/2019  8:17 AM. (History)       ALLERGIES Allergies  Allergen Reactions  . Codeine Hives  . Levofloxacin Other (See Comments)    Unknown, but noted on MAR  . Penicillins Hives    Has patient had a PCN reaction causing immediate rash, facial/tongue/throat swelling, SOB or lightheadedness with hypotension: Yes Has patient had a PCN reaction causing severe rash involving mucus membranes or skin necrosis: Unk Has patient had a PCN reaction that required hospitalization: Unk Has patient had a PCN reaction occurring within the last 10 years: Unk If all of the above  answers are "NO", then may proceed with Cephalosporin use.     PAST MEDICAL HISTORY Past Medical History:  Diagnosis Date  . Anemia   . Blood transfusion   . Chronic back pain   . Dementia   . Diabetes mellitus   . Hemodialysis patient (HCC) 03/15/11   "Tues; Thurs; Sat[facility]"  . Hypertension   . PEA (Pulseless electrical activity) (HCC)   . Peripheral vascular disease (HCC)   . Rash 03/15/11   "admitted me to find out what its' from; UE, waist, thighs, groin"  . Renal failure    hd 4/12  . Stroke [facility](HCC)    Past Surgical History:  Procedure Laterality Date  . AMPUTATION  03/04/2011   Procedure: AMPUTATION DIGIT;  Surgeon: Nadara MustardMarcus V Duda, MD;  Location: [facility] OR;  Service: Orthopedics;  Laterality: Right;  RT GREAT TOE AMP  . AMPUTATION Right 03/13/2015   Procedure: RIGHT  ABOVE KNEE AMPUTATION;  Surgeon: Pryor Ochoa, MD;  Location: [facility] OR;  Service: Vascular;  Laterality: Right;  . ARTERIOVENOUS GRAFT PLACEMENT  10/01/10   left upper arm  . BELOW KNEE LEG AMPUTATION  2004   left  . CARPAL TUNNEL RELEASE  ~ 2009   left wrist  . CATARACT EXTRACTION W/ INTRAOCULAR LENS IMPLANT  12/2009   right eye  . HAND AMPUTATION Left   . INSERTION OF DIALYSIS CATHETER  07/29/10   right subclavian  . IR FLUORO RM 30-60 MIN  08/16/2016  . IR GASTROSTOMY TUBE MOD SED  08/23/2016  . IR GASTROSTOMY TUBE REMOVAL  02/07/2017  . TEE WITHOUT CARDIOVERSION  03/18/2011   Procedure: TRANSESOPHAGEAL ECHOCARDIOGRAM (TEE);  Surgeon: [name], MD;  Location: [facility] ENDOSCOPY;  Service: Cardiovascular;  Laterality: N/A;    FAMILY HISTORY Family History  Problem Relation Age of Onset  . Diabetes Mellitus II Mother   . Stroke Mother     SOCIAL HISTORY Social History   Tobacco Use  . Smoking status: Never Smoker  . Smokeless tobacco: Never Used  Substance Use Topics  . Alcohol use: No  . Drug use: No         OPHTHALMIC EXAM:  Base Eye Exam    Visual Acuity (Snellen -  Linear)      Right Left   Dist [place] 20/50 -1 20/40 -2   Dist ph [place] NI NI       Tonometry (Tonopen, 8:30 AM)      Right Left   Pressure 11 13       Pupils      Dark Light Shape React APD   Right 3 2 Round Sluggish None   Left 3 2 Round Sluggish None       Visual Fields (Counting fingers)      Left Right    Full Full       Extraocular Movement      Right Left    Full, Ortho Full, Ortho       Neuro/Psych    Oriented x3:  Yes   Mood/Affect:  Normal       Dilation    Both eyes:  1.0% Mydriacyl, 2.5% Phenylephrine @ 8:30 AM        Slit Lamp and Fundus Exam    External Exam      Right Left   External wheelchair bound; bilateral lower limb amputee wheelchair bound; bilateral lower limb amputee       Slit Lamp Exam      Right Left   Lids/Lashes Dermatochalasis - upper lid, Telangiectasia of upper lid Dermatochalasis - upper lid, Telangiectasia of upper lid   Conjunctiva/Sclera Nasal Pinguecula Nasal and temproal Pinguecula   Cornea Clear Clear   Anterior Chamber Deep and quiet moderatre depth   Iris Round and dilated, No NVI Round and dilated, No NVI   Lens  PC IOL in good position, Mild anterior capsule fimosis 2+ Nuclear sclerosis, 2+ Cortical cataract   Vitreous Vitreous syneresis Vitreous syneresis       Fundus Exam      Right Left   Disc peripapillary pigmentation temporally, fibrosis overlying disc - regressing, +NVD - regressing Normal   C/D Ratio obscured by fibrosis 0.4   Macula Flat, ERM, scattered MAs; no edema Flat, scattered MAs or edema   Vessels Vascular attenuation; peripheral sclerosis Vascular attenuation; peripheral sclerosis   Periphery large pre-retinal hemorrhage inferiorly at 0700 - shrinking, 360 scattered dot hemes attached; few MAs  IMAGING AND PROCEDURES  Imaging and Procedures for 05/25/17  OCT, Retina - OU - Both Eyes     Right Eye Quality was borderline. Central Foveal Thickness: 241. Progression has been stable.  Findings include normal foveal contour, no IRF, no SRF, epiretinal membrane.   Left Eye Quality was borderline. Central Foveal Thickness: 246. Progression has been stable. Findings include normal foveal contour, epiretinal membrane, no IRF, no SRF (Diffuse atrophy temporally ).   Notes *Images captured and stored on drive  Diagnosis / Impression:  No DME OU  Clinical management:  See below  Abbreviations: NFP - Normal foveal profile. CME - cystoid macular edema. PED - pigment epithelial detachment. IRF - intraretinal fluid. SRF - subretinal fluid. EZ - ellipsoid zone. ERM - epiretinal membrane. ORA - outer retinal atrophy. ORT - outer retinal tubulation. SRHM - subretinal hyper-reflective material         Panretinal Photocoagulation - OS - Left Eye     LASER PROCEDURE NOTE  Diagnosis:   Proliferative Diabetic Retinopathy, LEFT EYE  Procedure:  Pan-retinal photocoagulation using slit lamp laser, LEFT EYE  Anesthesia:  Topical  Surgeon: Rennis Chris, MD, PhD   Informed consent obtained, operative eye marked, and time out performed prior to initiation of laser.   Lumenis [id number] Laser Indirect Ophthalmoscope Power: 400 mW Duration: 150 msec   # spots: 747 spots  Complications: None.  Notes: significant cortical cataract preventing laser up take peripherally  RTC: 4 wks  Patient tolerated the procedure well and received written and verbal post-procedure care information/education.                 ASSESSMENT/PLAN:    ICD-10-CM   1. 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 Retinal edema H35.81 OCT, Retina - OU - Both Eyes  4. Essential hypertension I10   5. Hypertensive retinopathy of both eyes H35.033   6. Pseudophakia, right eye Z96.1   7. Combined forms of  age-related cataract, left eye H25.812     1-3. Proliferative diabetic retinopathy w/o macular edema, both eyes The incidence, risk factors for progression, natural history and treatment options for diabetic retinopathy  were discussed with patient.  The need for close monitoring of blood glucose, blood pressure, and serum lipids, avoiding cigarette or any type of tobacco, and the need for long term follow up was also discussed with patient. - s/p PRP OD (01.17.19) - early fibrosis overlying disc OD with fine NVD -- regressing - large preretinal hemorrhage inferiorly ?ruptured RAM? -- improving - OCT stable without diabetic macular edema, both eyes - recommend PRP OS today (1.31.19) - pt wishes to proceed with PRP laser OS today (01.31.19) - RBA of procedure discussed, questions answered - informed consent obtained and signed by POA - see procedure note - pt tolerated well - start PF QID OS x7 days - f/u in 4 wks - possible PRP fill in OD  4,5. Hypertensive retinopathy OU - discussed importance of tight BP control - monitor  6. Pseudophakia OD  - s/p CE/IOL OD -- pt unable to recall surgeon  - stable, doing well  - monitor  7. Nuclear and cortical cataract OS - The symptoms of cataract, surgical options, and treatments and risks were discussed with patient. - discussed diagnosis and progression - approaching visual significance - monitor for  now   Ophthalmic Meds Ordered this visit:  No orders of the defined types were placed in this encounter.      Return in about 4 weeks (around 06/22/2017) for F/U PDR OU; S/P PRP OU.  There are no Patient Instructions on file for this visit.   Explained the diagnoses, plan, and follow up with the patient and they expressed understanding.  Patient expressed understanding of the importance of proper follow up care.   This document serves as a record of services personally performed by Karie Chimera, MD, PhD. It was created on their  behalf by Virgilio Belling, COA, a certified ophthalmic assistant. The creation of this record is the provider's dictation and/or activities during the visit.  Electronically signed by: Virgilio Belling, COA  05/25/17 10:06 AM    Karie Chimera, M.D., Ph.D. Diseases & Surgery of the Retina and Vitreous [facility] 05/25/17   I have reviewed the above documentation for accuracy and completeness, and I agree with the above. Karie Chimera, M.D., Ph.D. 05/25/17 10:06 AM    Abbreviations: M myopia (nearsighted); A astigmatism; H hyperopia (farsighted); P presbyopia; Mrx spectacle prescription;  CTL contact lenses; OD right eye; OS left eye; OU both eyes  XT exotropia; ET esotropia; PEK punctate epithelial keratitis; PEE punctate epithelial erosions; DES dry eye syndrome; MGD meibomian gland dysfunction; ATs artificial tears; PFAT's preservative free artificial tears; NSC nuclear sclerotic cataract; PSC posterior subcapsular cataract; ERM epi-retinal membrane; PVD posterior vitreous detachment; RD retinal detachment; DM diabetes mellitus; DR diabetic retinopathy; NPDR non-proliferative diabetic retinopathy; PDR proliferative diabetic retinopathy; CSME clinically significant macular edema; DME diabetic macular edema; dbh dot blot hemorrhages; CWS cotton wool spot; POAG primary open angle glaucoma; C/D cup-to-disc ratio; HVF humphrey visual field; GVF goldmann visual field; OCT optical coherence tomography; IOP intraocular pressure; BRVO Branch retinal vein occlusion; CRVO central retinal vein occlusion; CRAO central retinal artery occlusion; BRAO branch retinal artery occlusion; RT retinal tear; SB scleral buckle; PPV pars plana vitrectomy; VH Vitreous hemorrhage; PRP panretinal laser photocoagulation; IVK intravitreal kenalog; VMT vitreomacular traction; MH Macular hole;  NVD neovascularization of the disc; NVE neovascularization elsewhere; AREDS age related eye disease study; ARMD  age related macular degeneration; POAG primary open angle glaucoma; EBMD epithelial/anterior basement membrane dystrophy; ACIOL anterior chamber intraocular lens; IOL intraocular lens; PCIOL posterior chamber intraocular lens; Phaco/IOL phacoemulsification with intraocular lens placement; PRK photorefractive keratectomy; LASIK laser assisted in situ keratomileusis; HTN hypertension; DM diabetes mellitus; COPD chronic obstructive pulmonary disease

## 2017-05-25 ENCOUNTER — Encounter (INDEPENDENT_AMBULATORY_CARE_PROVIDER_SITE_OTHER): Payer: Self-pay | Admitting: Ophthalmology

## 2017-05-25 ENCOUNTER — Ambulatory Visit (INDEPENDENT_AMBULATORY_CARE_PROVIDER_SITE_OTHER): Payer: Medicare Other | Admitting: Ophthalmology

## 2017-05-25 DIAGNOSIS — I10 Essential (primary) hypertension: Secondary | ICD-10-CM | POA: Diagnosis not present

## 2017-05-25 DIAGNOSIS — E11319 Type 2 diabetes mellitus with unspecified diabetic retinopathy without macular edema: Secondary | ICD-10-CM | POA: Diagnosis not present

## 2017-05-25 DIAGNOSIS — H35049 Retinal micro-aneurysms, unspecified, unspecified eye: Secondary | ICD-10-CM | POA: Diagnosis not present

## 2017-05-25 DIAGNOSIS — H3581 Retinal edema: Secondary | ICD-10-CM

## 2017-05-25 DIAGNOSIS — H35033 Hypertensive retinopathy, bilateral: Secondary | ICD-10-CM

## 2017-05-25 DIAGNOSIS — H25812 Combined forms of age-related cataract, left eye: Secondary | ICD-10-CM

## 2017-05-25 DIAGNOSIS — E113593 Type 2 diabetes mellitus with proliferative diabetic retinopathy without macular edema, bilateral: Secondary | ICD-10-CM | POA: Diagnosis not present

## 2017-05-25 DIAGNOSIS — Z961 Presence of intraocular lens: Secondary | ICD-10-CM | POA: Diagnosis not present

## 2017-05-29 ENCOUNTER — Emergency Department (HOSPITAL_COMMUNITY)
Admission: EM | Admit: 2017-05-29 | Discharge: 2017-05-30 | Disposition: A | Discharge status: Home / Self Care | Payer: Medicare Other | Attending: Emergency Medicine | Admitting: Emergency Medicine

## 2017-05-29 ENCOUNTER — Other Ambulatory Visit: Payer: Self-pay

## 2017-05-29 ENCOUNTER — Emergency Department (HOSPITAL_COMMUNITY): Payer: Medicare Other

## 2017-05-29 ENCOUNTER — Encounter (HOSPITAL_COMMUNITY): Payer: Self-pay

## 2017-05-29 DIAGNOSIS — Z794 Long term (current) use of insulin: Secondary | ICD-10-CM | POA: Insufficient documentation

## 2017-05-29 DIAGNOSIS — Z7982 Long term (current) use of aspirin: Secondary | ICD-10-CM | POA: Insufficient documentation

## 2017-05-29 DIAGNOSIS — Z79899 Other long term (current) drug therapy: Secondary | ICD-10-CM | POA: Diagnosis not present

## 2017-05-29 DIAGNOSIS — F329 Major depressive disorder, single episode, unspecified: Secondary | ICD-10-CM | POA: Insufficient documentation

## 2017-05-29 DIAGNOSIS — N186 End stage renal disease: Secondary | ICD-10-CM | POA: Insufficient documentation

## 2017-05-29 DIAGNOSIS — L98491 Non-pressure chronic ulcer of skin of other sites limited to breakdown of skin: Secondary | ICD-10-CM

## 2017-05-29 DIAGNOSIS — Z992 Dependence on renal dialysis: Secondary | ICD-10-CM | POA: Diagnosis not present

## 2017-05-29 DIAGNOSIS — Z8673 Personal history of transient ischemic attack (TIA), and cerebral infarction without residual deficits: Secondary | ICD-10-CM | POA: Insufficient documentation

## 2017-05-29 DIAGNOSIS — E1122 Type 2 diabetes mellitus with diabetic chronic kidney disease: Secondary | ICD-10-CM | POA: Insufficient documentation

## 2017-05-29 DIAGNOSIS — K626 Ulcer of anus and rectum: Secondary | ICD-10-CM | POA: Insufficient documentation

## 2017-05-29 DIAGNOSIS — F0390 Unspecified dementia without behavioral disturbance: Secondary | ICD-10-CM | POA: Diagnosis not present

## 2017-05-29 DIAGNOSIS — I120 Hypertensive chronic kidney disease with stage 5 chronic kidney disease or end stage renal disease: Secondary | ICD-10-CM | POA: Insufficient documentation

## 2017-05-29 DIAGNOSIS — K6289 Other specified diseases of anus and rectum: Secondary | ICD-10-CM | POA: Diagnosis present

## 2017-05-29 LAB — COMPREHENSIVE METABOLIC PANEL
ALT: 10 U/L — ABNORMAL LOW (ref 14–54)
AST: 20 U/L (ref 15–41)
Albumin: 3.6 g/dL (ref 3.5–5.0)
Alkaline Phosphatase: 186 U/L — ABNORMAL HIGH (ref 38–126)
Anion gap: 15 (ref 5–15)
BUN: 22 mg/dL — AB (ref 6–20)
CHLORIDE: 95 mmol/L — AB (ref 101–111)
CO2: 28 mmol/L (ref 22–32)
Calcium: 9.4 mg/dL (ref 8.9–10.3)
Creatinine, Ser: 3.76 mg/dL — ABNORMAL HIGH (ref 0.44–1.00)
GFR calc non Af Amer: 13 mL/min — ABNORMAL LOW (ref >60)
GFR, EST AFRICAN AMERICAN: 15 mL/min — AB (ref >60)
Glucose, Bld: 166 mg/dL — ABNORMAL HIGH (ref 65–99)
POTASSIUM: 3.4 mmol/L — AB (ref 3.5–5.1)
SODIUM: 138 mmol/L (ref 135–145)
Total Bilirubin: 1.2 mg/dL (ref 0.3–1.2)
Total Protein: 6.9 g/dL (ref 6.5–8.1)

## 2017-05-29 LAB — CBC WITH DIFFERENTIAL/PLATELET
BASOS PCT: 1 %
Band Neutrophils: 0 %
Basophils Absolute: 0 10*3/uL (ref 0.0–0.1)
Blasts: 0 %
EOS ABS: 0.1 10*3/uL (ref 0.0–0.7)
EOS PCT: 3 %
HCT: 37.4 % (ref 36.0–46.0)
Hemoglobin: 12.3 g/dL (ref 12.0–15.0)
LYMPHS ABS: 0.8 10*3/uL (ref 0.7–4.0)
LYMPHS PCT: 17 %
MCH: 30.3 pg (ref 26.0–34.0)
MCHC: 32.9 g/dL (ref 30.0–36.0)
MCV: 92.1 fL (ref 78.0–100.0)
MONOS PCT: 7 %
Metamyelocytes Relative: 0 %
Monocytes Absolute: 0.3 10*3/uL (ref 0.1–1.0)
Myelocytes: 0 %
NEUTROS ABS: 3.4 10*3/uL (ref 1.7–7.7)
NEUTROS PCT: 72 %
NRBC: 0 /100{WBCs}
PLATELETS: 110 10*3/uL — AB (ref 150–400)
PROMYELOCYTES ABS: 0 %
RBC: 4.06 MIL/uL (ref 3.87–5.11)
RDW: 14.6 % (ref 11.5–15.5)
WBC: 4.6 10*3/uL (ref 4.0–10.5)

## 2017-05-29 LAB — URINALYSIS, ROUTINE W REFLEX MICROSCOPIC
BILIRUBIN URINE: NEGATIVE
Bacteria, UA: NONE SEEN
KETONES UR: NEGATIVE mg/dL
Nitrite: NEGATIVE
Protein, ur: >=300 mg/dL — AB
Specific Gravity, Urine: 1.013 (ref 1.005–1.030)
pH: 9 — ABNORMAL HIGH (ref 5.0–8.0)

## 2017-05-29 LAB — LIPASE, BLOOD: LIPASE: 40 U/L (ref 11–51)

## 2017-05-29 LAB — I-STAT CG4 LACTIC ACID, ED: Lactic Acid, Venous: 1.45 mmol/L (ref 0.5–1.9)

## 2017-05-29 MED ORDER — VANCOMYCIN HCL IN DEXTROSE 1-5 GM/200ML-% IV SOLN
1000.0000 mg | Freq: Once | INTRAVENOUS | Status: AC
  Administered 2017-05-29: 1000 mg via INTRAVENOUS
  Filled 2017-05-29: qty 200

## 2017-05-29 MED ORDER — IOPAMIDOL (ISOVUE-300) INJECTION 61%
INTRAVENOUS | Status: AC
  Administered 2017-05-29: 75 mL
  Filled 2017-05-29: qty 75

## 2017-05-29 MED ORDER — SODIUM CHLORIDE 0.9 % IV BOLUS (SEPSIS)
500.0000 mL | Freq: Once | INTRAVENOUS | Status: AC
  Administered 2017-05-29: 500 mL via INTRAVENOUS

## 2017-05-29 NOTE — ED Provider Notes (Signed)
[facility] Provider Note   CSN: [id number] Arrival date & time: 05/29/17  1754     History   Chief Complaint Chief Complaint  Patient presents with  . Eye Problem    also c/o issues with pressure ulcer to buttock    HPI [name] is a 57 y.o. female.  HPI   57 year old female on dialysis, history of diabetes, history of hypertension, status post multiple extremity amputations presents today after dialysis complaining of rectal pain.  She states this is been present for "a while".  She is unable to give me a timeframe.  She states that she is having worsening rectal pain.  She denies any diarrhea or constipation.  She denies fever or chills.  She is also complaining that she had a cataract repaired in her left eye and her vision is not back to normal.  However she has no new complaints with this side. Past Medical History:  Diagnosis Date  . Anemia   . Blood transfusion   . Chronic back pain   . Dementia   . Diabetes mellitus   . Hemodialysis patient (HCC) 03/15/11   "Tues; Thurs; Sat[facility]"  . Hypertension   . PEA (Pulseless electrical activity) (HCC)   . Peripheral vascular disease (HCC)   . Rash 03/15/11   "admitted me to find out what its' from; UE, waist, thighs, groin"  . Renal failure    hd 4/12  . Stroke [facility](HCC)     Patient Active Problem List   Diagnosis Date Noted  . Hypothermia   . Abnormal EKG   . Elevated troponin   . Hypoglycemia due to type 2 diabetes mellitus (HCC) 06/11/2015  . Pulmonary edema 06/11/2015  . S/P AKA (above knee amputation) unilateral (HCC) 05/19/2015  . 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 Depression 03/13/2015  . Prolonged Q-T interval on ECG 03/13/2015  . Type 2 diabetes mellitus, controlled, with renal complications (HCC) 12/12/2014  . Unspecified local infection of skin and subcutaneous tissue   . Chronic anemia  12/10/2014  . Ulcer of lower limb, unspecified 01/22/2013  . Mechanical complication of other vascular device, implant, and graft 05/01/2012  . Mental status, decreased 03/19/2012  . Seizure-like activity (HCC) 03/19/2012  . Thrombocytopenia (HCC) 01/24/2012  . Acute respiratory failure with hypoxia (HCC) 01/23/2012  . HTN (hypertension) 03/01/2011  . ESRD (end stage renal disease) on dialysis (HCC) 03/01/2011  . Obesities, morbid (HCC) 03/01/2011  . Osteomyelitis (HCC) 03/01/2011  . 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 Complication of vascular access for dialysis 03/01/2011    Past Surgical History:  Procedure Laterality Date  . AMPUTATION  03/04/2011   Procedure: AMPUTATION DIGIT;  Surgeon: Nadara MustardMarcus V Duda, MD;  [facility];  Service: Orthopedics;  Laterality: Right;  RT GREAT TOE AMP  . AMPUTATION Right 03/13/2015   Procedure: RIGHT  ABOVE KNEE AMPUTATION;  Surgeon: Pryor OchoaJames D Lawson, MD;  [facility];  Service: Vascular;  Laterality: Right;  . ARTERIOVENOUS GRAFT PLACEMENT  10/01/10   left upper arm  . BELOW KNEE LEG AMPUTATION  2004   left  . CARPAL TUNNEL RELEASE  ~ 2009   left wrist  . CATARACT EXTRACTION W/ INTRAOCULAR LENS IMPLANT  12/2009   right eye  . HAND AMPUTATION Left   . INSERTION OF DIALYSIS CATHETER  07/29/10   right subclavian  . IR FLUORO RM 30-60 MIN  08/16/2016  .  IR GASTROSTOMY TUBE MOD SED  08/23/2016  . IR GASTROSTOMY TUBE REMOVAL  02/07/2017  . TEE WITHOUT CARDIOVERSION  03/18/2011   Procedure: TRANSESOPHAGEAL ECHOCARDIOGRAM (TEE);  Surgeon: [name], MD;  Location[facility] ENDOSCOPY;  Service: Cardiovascular;  Laterality: N/A;    OB History    No data available       Home Medications    Prior to Admission medications   Medication Sig Start Date End Date Taking? Authorizing Provider  aspirin 81 MG chewable tablet Place 1 tablet (81 mg total) into feeding tube daily. Patient taking differently: Chew 81 mg by mouth daily.  08/28/16    Leroy Sea, MD  atorvastatin (LIPITOR) 10 MG tablet Take 10 mg by mouth daily.    [provider]  Cholecalciferol (VITAMIN D-3) 5000 units TABS Take 5,000 Units by mouth daily.    [provider]  gabapentin (NEURONTIN) 100 MG capsule Take 100 mg by mouth at bedtime.    [provider]  glucagon (GLUCAGON EMERGENCY) 1 MG injection Inject 1 mg into the vein once as needed (for blood sugar).    [provider]  glycerin adult 2 g suppository Place 1 suppository rectally as needed for constipation.    [provider]  guaiFENesin (ROBITUSSIN) 100 MG/5ML SOLN Take 20 mLs by mouth every 8 (eight) hours as needed for cough or to loosen phlegm.    [provider]  HYDROcodone-acetaminophen (NORCO/VICODIN) 5-325 MG tablet Take 1-2 tablets by mouth every 6 (six) hours as needed. 01/12/17   Roxy Horseman, PA-C  hydrocortisone cream 1 % Apply 1 application topically every 8 (eight) hours as needed for itching.    [provider]  Infant Care Products (DERMACLOUD) CREA Apply topically 3 (three) times daily. To buttocks    [provider]  insulin aspart (NOVOLOG) 100 UNIT/ML injection Inject 2-10 Units into the skin 3 (three) times daily before meals. Per sliding scale 0-59: notify doctor 60-100= 0 units 101-199 = 2 units 200-299 = 6 units 300-349 = 8 units 350-399 = 10 units 400-100 = Notify Doctor    [provider]  lamoTRIgine (LAMICTAL) 25 MG tablet Take 25 mg by mouth at bedtime.    [provider]  levothyroxine (SYNTHROID, LEVOTHROID) 25 MCG tablet Place 1 tablet (25 mcg total) into feeding tube daily before breakfast. Patient taking differently: Take 25 mcg by mouth daily before breakfast.  08/28/16   Leroy Sea, MD  Melatonin-Pyridoxine 3-1 MG TABS Take 1 tablet by mouth at bedtime.    [provider]  mirtazapine (REMERON) 30 MG tablet Take 30 mg by mouth at bedtime.    [provider]  neomycin-bacitracin-polymyxin (NEOSPORIN) ointment Apply 1 application topically daily.    [provider]  nitrofurantoin, macrocrystal-monohydrate, (MACROBID) 100 MG capsule Take 1 capsule (100 mg total) by mouth 2 (two) times daily. X 7 days 05/04/17   Palumbo, April, MD  Nutritional Supplements (FEEDING SUPPLEMENT, NEPRO CARB STEADY,) LIQD Place 360 mLs into feeding tube 3 (three) times daily with meals. 08/27/16   Leroy Sea, MD  ondansetron (ZOFRAN) 4 MG tablet Take 4 mg by mouth every 8 (eight) hours as needed for nausea or vomiting.    [provider]  pantoprazole (PROTONIX) 40 MG tablet Take 40 mg by mouth every evening.     [provider]  polyethylene glycol (MIRALAX / GLYCOLAX) packet Take 17 g by mouth daily.    [provider]  polyethylene glycol (MIRALAX)  packet Take 17 g by mouth daily. 05/04/17   Palumbo, April, MD  prednisoLONE acetate (PRED FORTE) 1 % ophthalmic suspension Place 1 drop into the right eye 4 (four) times daily. 05/11/17   Rennis Chris, MD  predniSONE (DELTASONE) 50 MG tablet Take 1 tablet daily x 5 days. 01/19/17   Adonis Huguenin, NP  Probiotic Product (PROBIOTIC PO) Take 250 mg by mouth 2 (two) times daily.    [provider]  promethazine (PHENERGAN) 6.25 MG/5ML syrup Take 12.5 mg by mouth every 6 (six) hours as needed for nausea or vomiting.    [provider]  Propylene Glycol-Glycerin (ARTIFICIAL TEARS) 1-0.3 % SOLN Place 1 drop into both eyes every 12 (twelve) hours as needed (for eye iritation).    [provider]  senna-docusate (SENOKOT-S) 8.6-50 MG tablet Take 2 tablets by mouth at bedtime.     [provider]  sevelamer carbonate (RENVELA) 800 MG tablet Take 400 mg by mouth 3 (three) times daily with meals.    [provider]  sucralfate (CARAFATE) 1 g tablet Take 1 g by mouth 4 (four) times daily.    [provider]  traMADol (ULTRAM) 50 MG  tablet Take 1 tablet (50 mg total) by mouth every 12 (twelve) hours as needed for moderate pain. Patient taking differently: Take 50 mg by mouth every 8 (eight) hours as needed for moderate pain.  06/15/15   Clydia Llano, MD  Water For Irrigation, Sterile (FREE WATER) SOLN Place 100 mLs into feeding tube every 8 (eight) hours. 08/27/16   Leroy Sea, MD    Family History Family History  Problem Relation Age of Onset  . Diabetes Mellitus II Mother   . Stroke Mother     Social History Social History   Tobacco Use  . Smoking status: Never Smoker  . Smokeless tobacco: Never Used  Substance Use Topics  . Alcohol use: No  . Drug use: No     Allergies   Codeine; Levofloxacin; and Penicillins   Review of Systems Review of Systems  Constitutional: Positive for activity change and appetite change.  HENT: Negative.   Eyes: Positive for redness.  Respiratory: Negative.   Cardiovascular: Negative.   Gastrointestinal: Positive for abdominal pain.  Endocrine: Negative.   Genitourinary: Negative.   Musculoskeletal: Negative.   Allergic/Immunologic: Negative.   Neurological: Negative.   Hematological: Negative.   Psychiatric/Behavioral: Negative.   All other systems reviewed and are negative.    Physical Exam Updated Vital Signs Pulse 84   Temp 98.4 F (36.9 C) (Oral)   Ht 1.6 m (5\' 3" )   SpO2 95%   BMI 19.64 kg/m   Physical Exam  Constitutional: She is oriented to person, place, and time.  Multiple amputations  HENT:  Head: Normocephalic and atraumatic.  Right Ear: External ear normal.  Left Ear: External ear normal.  Nose: Nose normal.  Mouth/Throat: Oropharynx is clear and moist.  Eyes:  Right pupil reactive Left pupil mid size and reactive with some subconjunctival hemmorhage eom intact  Cardiovascular: Normal rate and regular rhythm.  Pulmonary/Chest: Effort normal and breath sounds normal.  Abdominal: Soft.  Diffuse right sided ttp  Genitourinary:    Genitourinary Comments: Skin erythema and redness with ttp perirectal area  Musculoskeletal:  Right multiple finger amputation Left upper arm dialysis access Left lower arm amputation Left aka Right bka   Neurological: She is alert and oriented to person, place, and time. No cranial nerve deficit. Coordination normal.  Skin:  Skin is warm. Capillary refill takes less than 2 seconds.  Psychiatric: She has a normal mood and affect.  Nursing note and vitals reviewed.    ED Treatments / Results  Labs (all labs ordered are listed, but only abnormal results are displayed) Labs Reviewed - No data to display  EKG  EKG Interpretation  Date/Time:  Monday May 29 2017 19:30:55 EST Ventricular Rate:  87 PR Interval:    QRS Duration: 91 QT Interval:  404 QTC Calculation: 486 R Axis:   126 Text Interpretation:  Sinus rhythm Right axis deviation Abnormal T, consider ischemia, lateral leads No significant change since last tracing Confirmed by Margarita Grizzle [phone]) on 05/29/2017 10:40:37 PM       Radiology Ct Abdomen Pelvis W Contrast  Result Date: 05/29/2017 CLINICAL DATA:  Buttock pain EXAM: CT ABDOMEN AND PELVIS WITH CONTRAST TECHNIQUE: Multidetector CT imaging of the abdomen and pelvis was performed using the standard protocol following bolus administration of intravenous contrast. CONTRAST:  75mL ISOVUE-300 IOPAMIDOL (ISOVUE-300) INJECTION 61% COMPARISON:  05/04/2017, 08/09/2016 FINDINGS: Lower chest: Lung bases demonstrate no acute consolidation or pleural effusion. Coronary vascular calcification. Borderline cardiomegaly. Hepatobiliary: No focal hepatic abnormality. Surgical clips at the gallbladder fossa. No biliary dilatation. Pancreas: Unremarkable. No pancreatic ductal dilatation or surrounding inflammatory changes. Spleen: Normal in size without focal abnormality. Adrenals/Urinary Tract: Adrenal glands are within normal limits. Atrophic kidneys. No hydronephrosis. Thick-walled  urinary bladder Stomach/Bowel: Stomach is within normal limits. Appendix appears normal. No evidence of bowel wall thickening, distention, or inflammatory changes. Moderate retained feces in the rectum. Vascular/Lymphatic: Extensive aortic atherosclerosis without aneurysmal dilatation. Diffuse calcifications within the mesenteric vessels. Prominent left external iliac lymph nodes, measuring up to 12 mm. Reproductive: Uterus and bilateral adnexa are unremarkable. Other: Negative for free air. Small amount of ascites adjacent to the liver. Moderate diffuse subcutaneous edema. Musculoskeletal: Stable chronic superior endplate deformity at L4. Degenerative changes. No evidence for subcutaneous abscess collection or soft tissue gas. Skin thickening and edema within the subcutaneous fat of the medial gluteal fold. IMPRESSION: 1. Skin thickening and edema within the subcutaneous fat of the medial gluteal fold but no evidence for abscess, gas collection, or sinus tract 2. Small amount of free fluid adjacent to the liver 3. Atrophy of the kidneys. Thick-walled urinary bladder could be due to underdistention versus cystitis. 4. Moderate retained feces in the rectum Electronically Signed   By: Jasmine Pang M.D.   On: 05/29/2017 20:18   Dg Chest Port 1 View  Result Date: 05/29/2017 CLINICAL DATA:  Pt c/o weakness after dialysis today and diabetic/pressure wound on her buttocks. Hx of DM AND HTN. Pt is a nonsmoker. Best obtainable image; pt struggled to lay flat on her back due to her wound. EXAM: PORTABLE CHEST 1 VIEW COMPARISON:  None. FINDINGS: Normal mediastinum and cardiac silhouette. Normal pulmonary vasculature. No evidence of effusion, infiltrate, or pneumothorax. No acute bony abnormality. IMPRESSION: No acute cardiopulmonary process. Electronically Signed   By: Genevive Bi M.D.   On: 05/29/2017 19:06    Procedures Procedures (including critical care time)  Medications Ordered in ED Medications - No data  to display   Initial Impression / Assessment and Plan / ED Course  I have reviewed the triage vital signs and the nursing notes.  Pertinent labs & imaging results that were available during my care of the patient were reviewed by me and considered in my medical decision making (see chart for details).     57 y.o female with  esrd on dialysis presents today with chief complaint of perirectal pain.  Exam here reveals skin irritation exacerbated by frequent stooling in diaper.  Patient with some diffuse abdominal ttp evaluated with labs and ct of abdomen without acute intraabdominal abnormality but skin thickening along gluteal fold and possible cystitis.  Discussed with Dr. Kathrene Bongo and will dose with vancomycin here to cover potential skin pathogens.  Skin breakdown and irritation appear more consistent with skin irritation from urine and stool and would benefit from being kept clean and dry.   Final Clinical Impressions(s) / ED Diagnoses   Final diagnoses:  Skin ulcer of perianal region, limited to breakdown of skin [facility])    ED Discharge Orders    None       Margarita Grizzle, MD 05/30/17 0005

## 2017-05-29 NOTE — ED Triage Notes (Addendum)
[facility] pt arrived via EMS from dialysis; pt completed dialysis and asked to be transported to ER for c/o R ear issues, including ringing in the ear; L eye pain; and pain to buttock for unstaged PU; SN observed and pat has stg II PU to bilateral inner folds of buttock; 114/70; 70; 96 on RA; CBG 153

## 2017-05-30 DIAGNOSIS — K626 Ulcer of anus and rectum: Secondary | ICD-10-CM | POA: Diagnosis not present

## 2017-05-30 NOTE — ED Notes (Signed)
Pt discharged from ED; instructions provided to [facility] and pt; Pt encouraged to return to ED if symptoms worsen and to f/u with PCP; Pt verbalized understanding of all instructions

## 2017-05-30 NOTE — Discharge Instructions (Signed)
Keep skin around perineum and rectum clean and dry.  Patient treated in ED with vancomycin for possible skin infection- there is no evidence of abscess formation.

## 2017-06-19 ENCOUNTER — Encounter (HOSPITAL_COMMUNITY): Payer: Self-pay

## 2017-06-19 ENCOUNTER — Other Ambulatory Visit: Payer: Self-pay

## 2017-06-19 DIAGNOSIS — R1031 Right lower quadrant pain: Secondary | ICD-10-CM | POA: Diagnosis present

## 2017-06-19 DIAGNOSIS — Z7982 Long term (current) use of aspirin: Secondary | ICD-10-CM | POA: Insufficient documentation

## 2017-06-19 DIAGNOSIS — Z79899 Other long term (current) drug therapy: Secondary | ICD-10-CM | POA: Insufficient documentation

## 2017-06-19 DIAGNOSIS — Z992 Dependence on renal dialysis: Secondary | ICD-10-CM | POA: Insufficient documentation

## 2017-06-19 DIAGNOSIS — N186 End stage renal disease: Secondary | ICD-10-CM | POA: Diagnosis not present

## 2017-06-19 DIAGNOSIS — F0390 Unspecified dementia without behavioral disturbance: Secondary | ICD-10-CM | POA: Insufficient documentation

## 2017-06-19 DIAGNOSIS — R109 Unspecified abdominal pain: Secondary | ICD-10-CM | POA: Diagnosis not present

## 2017-06-19 DIAGNOSIS — M545 Low back pain: Secondary | ICD-10-CM | POA: Insufficient documentation

## 2017-06-19 DIAGNOSIS — E1122 Type 2 diabetes mellitus with diabetic chronic kidney disease: Secondary | ICD-10-CM | POA: Diagnosis not present

## 2017-06-19 DIAGNOSIS — I120 Hypertensive chronic kidney disease with stage 5 chronic kidney disease or end stage renal disease: Secondary | ICD-10-CM | POA: Diagnosis not present

## 2017-06-19 LAB — COMPREHENSIVE METABOLIC PANEL
ALBUMIN: 4 g/dL (ref 3.5–5.0)
ALK PHOS: 195 U/L — AB (ref 38–126)
ALT: 11 U/L — ABNORMAL LOW (ref 14–54)
ANION GAP: 17 — AB (ref 5–15)
AST: 18 U/L (ref 15–41)
BILIRUBIN TOTAL: 0.9 mg/dL (ref 0.3–1.2)
BUN: 18 mg/dL (ref 6–20)
CALCIUM: 9.7 mg/dL (ref 8.9–10.3)
CO2: 28 mmol/L (ref 22–32)
CREATININE: 3.35 mg/dL — AB (ref 0.44–1.00)
Chloride: 94 mmol/L — ABNORMAL LOW (ref 101–111)
GFR calc Af Amer: 17 mL/min — ABNORMAL LOW (ref >60)
GFR calc non Af Amer: 15 mL/min — ABNORMAL LOW (ref >60)
GLUCOSE: 179 mg/dL — AB (ref 65–99)
Potassium: 3.2 mmol/L — ABNORMAL LOW (ref 3.5–5.1)
Sodium: 139 mmol/L (ref 135–145)
TOTAL PROTEIN: 7.8 g/dL (ref 6.5–8.1)

## 2017-06-19 LAB — CBC
HCT: 40.8 % (ref 36.0–46.0)
HEMOGLOBIN: 13.4 g/dL (ref 12.0–15.0)
MCH: 30.4 pg (ref 26.0–34.0)
MCHC: 32.8 g/dL (ref 30.0–36.0)
MCV: 92.5 fL (ref 78.0–100.0)
PLATELETS: 98 10*3/uL — AB (ref 150–400)
RBC: 4.41 MIL/uL (ref 3.87–5.11)
RDW: 14.1 % (ref 11.5–15.5)
WBC: 4.3 10*3/uL (ref 4.0–10.5)

## 2017-06-19 LAB — LIPASE, BLOOD: Lipase: 45 U/L (ref 11–51)

## 2017-06-19 NOTE — ED Triage Notes (Addendum)
Pt presents to the ed with complaints of lower back pain that radiates around to her right flank pain and lower abdomen, concerned for her kidney.  Pt is a dialysis pt and did go today and had her full treatment.

## 2017-06-20 ENCOUNTER — Emergency Department (HOSPITAL_COMMUNITY)
Admission: EM | Admit: 2017-06-20 | Discharge: 2017-06-20 | Disposition: A | Discharge status: Home / Self Care | Payer: Medicare Other | Attending: Emergency Medicine | Admitting: Emergency Medicine

## 2017-06-20 ENCOUNTER — Emergency Department (HOSPITAL_COMMUNITY): Payer: Medicare Other

## 2017-06-20 DIAGNOSIS — M545 Low back pain, unspecified: Secondary | ICD-10-CM

## 2017-06-20 LAB — URINALYSIS, ROUTINE W REFLEX MICROSCOPIC
Bilirubin Urine: NEGATIVE
GLUCOSE, UA: 150 mg/dL — AB
Ketones, ur: NEGATIVE mg/dL
NITRITE: NEGATIVE
PH: 9 — AB (ref 5.0–8.0)
PROTEIN: 100 mg/dL — AB
SPECIFIC GRAVITY, URINE: 1.013 (ref 1.005–1.030)

## 2017-06-20 MED ORDER — ONDANSETRON HCL 4 MG/2ML IJ SOLN
4.0000 mg | Freq: Once | INTRAMUSCULAR | Status: AC
  Administered 2017-06-20: 4 mg via INTRAVENOUS
  Filled 2017-06-20: qty 2

## 2017-06-20 MED ORDER — FENTANYL CITRATE (PF) 100 MCG/2ML IJ SOLN
50.0000 ug | Freq: Once | INTRAMUSCULAR | Status: AC
  Administered 2017-06-20: 50 ug via INTRAVENOUS
  Filled 2017-06-20: qty 2

## 2017-06-20 NOTE — ED Notes (Signed)
[facility] however they refused to transport her wheelchair, [facility] and [facility] stated they would come and get the wheelchair

## 2017-06-20 NOTE — ED Provider Notes (Signed)
[facility] Provider Note   CSN: [id number] Arrival date & time: 06/19/17  1652     History   Chief Complaint Chief Complaint  Patient presents with  . Back Pain    HPI [name] is a 57 y.o. female.  Patient presents with complaints of right-sided back, flank and abdominal pain.  Reports that the pain is constant and severe.  She has not had vomiting, diarrhea or constipation.  She denies fever.  She does report a distant history of kidney stone, thinks this might feel similar.  She thinks she has had previous appendectomy.  Patient does report that she urinates, but not every day.      Past Medical History:  Diagnosis Date  . Anemia   . Blood transfusion   . Chronic back pain   . Dementia   . Diabetes mellitus   . Hemodialysis patient (HCC) 03/15/11   "Tues; Thurs; Sat; [facility]"  . Hypertension   . PEA (Pulseless electrical activity) (HCC)   . Peripheral vascular disease (HCC)   . Rash 03/15/11   "admitted me to find out what its' from; UE, waist, thighs, groin"  . Renal failure    hd 4/12  . Stroke [facility](HCC)     Patient Active Problem List   Diagnosis Date Noted  . Hypothermia   . Abnormal EKG   . Elevated troponin   . Hypoglycemia due to type 2 diabetes mellitus (HCC) 06/11/2015  . Pulmonary edema 06/11/2015  . S/P AKA (above knee amputation) unilateral (HCC) 05/19/2015  . 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 Depression 03/13/2015  . Prolonged Q-T interval on ECG 03/13/2015  . Type 2 diabetes mellitus, controlled, with renal complications (HCC) 12/12/2014  . Unspecified local infection of skin and subcutaneous tissue   . Chronic anemia 12/10/2014  . Ulcer of lower limb, unspecified 01/22/2013  . Mechanical complication of other vascular device, implant, and graft 05/01/2012  . Mental status, decreased 03/19/2012  . Seizure-like activity (HCC)  03/19/2012  . Thrombocytopenia (HCC) 01/24/2012  . Acute respiratory failure with hypoxia (HCC) 01/23/2012  . HTN (hypertension) 03/01/2011  . ESRD (end stage renal disease) on dialysis (HCC) 03/01/2011  . Obesities, morbid (HCC) 03/01/2011  . Osteomyelitis (HCC) 03/01/2011  . 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 Complication of vascular access for dialysis 03/01/2011    Past Surgical History:  Procedure Laterality Date  . AMPUTATION  03/04/2011   Procedure: AMPUTATION DIGIT;  Surgeon: Nadara MustardMarcus V Duda, MD;  [facility];  Service: Orthopedics;  Laterality: Right;  RT GREAT TOE AMP  . AMPUTATION Right 03/13/2015   Procedure: RIGHT  ABOVE KNEE AMPUTATION;  Surgeon: Pryor OchoaJames D Lawson, MD;  [facility];  Service: Vascular;  Laterality: Right;  . ARTERIOVENOUS GRAFT PLACEMENT  10/01/10   left upper arm  . BELOW KNEE LEG AMPUTATION  2004   left  . CARPAL TUNNEL RELEASE  ~ 2009   left wrist  . CATARACT EXTRACTION W/ INTRAOCULAR LENS IMPLANT  12/2009   right eye  . HAND AMPUTATION Left   . INSERTION OF DIALYSIS CATHETER  07/29/10   right subclavian  . IR FLUORO RM 30-60 MIN  08/16/2016  . IR GASTROSTOMY TUBE MOD SED  08/23/2016  . IR GASTROSTOMY TUBE REMOVAL  02/07/2017  . TEE WITHOUT CARDIOVERSION  03/18/2011   Procedure: TRANSESOPHAGEAL ECHOCARDIOGRAM (TEE);  Surgeon: Marca Anconaalton McLean, MD;  [facility];  Service: Cardiovascular;  Laterality: N/A;    OB History    No data available       Home Medications    Prior to Admission medications   Medication Sig Start Date End Date Taking? Authorizing Provider  aspirin 81 MG chewable tablet Place 1 tablet (81 mg total) into feeding tube daily. Patient taking differently: Chew 81 mg by mouth daily.  08/28/16  Yes Leroy Sea, MD  atorvastatin (LIPITOR) 10 MG tablet Take 10 mg by mouth daily.   Yes [provider]  Cholecalciferol (VITAMIN D-3) 5000 units TABS Take 5,000 Units by mouth daily.   Yes  [provider]  gabapentin (NEURONTIN) 100 MG capsule Take 100 mg by mouth at bedtime.   Yes [provider]  glucagon (GLUCAGON EMERGENCY) 1 MG injection Inject 1 mg into the vein once as needed (for blood sugar).   Yes [provider]  glycerin adult 2 g suppository Place 1 suppository rectally as needed for constipation.   Yes [provider]  guaiFENesin (ROBITUSSIN) 100 MG/5ML SOLN Take 20 mLs by mouth every 8 (eight) hours as needed for cough or to loosen phlegm.   Yes [provider]  HYDROcodone-acetaminophen (NORCO/VICODIN) 5-325 MG tablet Take 1-2 tablets by mouth every 6 (six) hours as needed. 01/12/17  Yes Roxy Horseman, PA-C  hydrocortisone cream 1 % Apply 1 application topically every 8 (eight) hours as needed for itching.   Yes [provider]  Infant Care Products (DERMACLOUD) CREA Apply topically 3 (three) times daily. To buttocks   Yes [provider]  insulin aspart (NOVOLOG) 100 UNIT/ML injection Inject 2-10 Units into the skin 3 (three) times daily before meals. Per sliding scale 0-59: notify doctor 60-100= 0 units 101-199 = 2 units 200-299 = 6 units 300-349 = 8 units 350-399 = 10 units 400-100 = Notify Doctor   Yes [provider]  lamoTRIgine (LAMICTAL) 25 MG tablet Take 25 mg by mouth at bedtime.   Yes [provider]  levothyroxine (SYNTHROID, LEVOTHROID) 25 MCG tablet Place 1 tablet (25 mcg total) into feeding tube daily before breakfast. Patient taking differently: Take 25 mcg by mouth daily before breakfast.  08/28/16  Yes Leroy Sea, MD  Melatonin-Pyridoxine 3-1 MG TABS Take 1 tablet by mouth at bedtime.   Yes [provider]  mirtazapine (REMERON) 30 MG tablet Take 30 mg by mouth at bedtime.   Yes [provider]  neomycin-bacitracin-polymyxin (NEOSPORIN) ointment Apply 1 application topically daily.   Yes [provider]  ondansetron (ZOFRAN) 4 MG  tablet Take 4 mg by mouth every 8 (eight) hours as needed for nausea or vomiting.   Yes [provider]  pantoprazole (PROTONIX) 40 MG tablet Take 40 mg by mouth every evening.    Yes [provider]  polyethylene glycol (MIRALAX / GLYCOLAX) packet Take 17 g by mouth daily.   Yes [provider]  polyethylene glycol (MIRALAX) packet Take 17 g by mouth daily. 05/04/17  Yes Palumbo, April, MD  Probiotic Product (PROBIOTIC PO) Take 250 mg by mouth 2 (two) times daily.   Yes [provider]  promethazine (PHENERGAN) 6.25 MG/5ML syrup Take 12.5 mg by mouth every 6 (six) hours as needed for nausea or vomiting.   Yes [provider]  Propylene Glycol-Glycerin (ARTIFICIAL TEARS) 1-0.3 % SOLN Place 1 drop into both eyes every 12 (twelve) hours as needed (for eye iritation).   Yes [provider]  senna-docusate (SENOKOT-S) 8.6-50 MG tablet Take 2  tablets by mouth at bedtime.    Yes [provider]  sevelamer carbonate (RENVELA) 800 MG tablet Take 400 mg by mouth 3 (three) times daily with meals.   Yes [provider]  sucralfate (CARAFATE) 1 g tablet Take 1 g by mouth 4 (four) times daily.   Yes [provider]  traMADol (ULTRAM) 50 MG tablet Take 1 tablet (50 mg total) by mouth every 12 (twelve) hours as needed for moderate pain. Patient taking differently: Take 50 mg by mouth every 8 (eight) hours as needed for moderate pain.  06/15/15  Yes Clydia Llano, MD  triamcinolone (KENALOG) 0.025 % cream Apply 1 application topically 2 (two) times daily.   Yes [provider]  Jeanann Lewandowsky (PREPARATION H TOTABLES WIPES) 50 % PADS Apply topically every 8 (eight) hours as needed (for hemmorrhoids).   Yes [provider]  nitrofurantoin, macrocrystal-monohydrate, (MACROBID) 100 MG capsule Take 1 capsule (100 mg total) by mouth 2 (two) times daily. X 7 days Patient not taking: Reported on 06/20/2017 05/04/17   Palumbo, April,  MD  Nutritional Supplements (FEEDING SUPPLEMENT, NEPRO CARB STEADY,) LIQD Place 360 mLs into feeding tube 3 (three) times daily with meals. Patient not taking: Reported on 06/20/2017 08/27/16   Leroy Sea, MD  prednisoLONE acetate (PRED FORTE) 1 % ophthalmic suspension Place 1 drop into the right eye 4 (four) times daily. Patient not taking: Reported on 06/20/2017 05/11/17   Rennis Chris, MD  predniSONE (DELTASONE) 50 MG tablet Take 1 tablet daily x 5 days. Patient not taking: Reported on 06/20/2017 01/19/17   Adonis Huguenin, NP  Water For Irrigation, Sterile (FREE WATER) SOLN Place 100 mLs into feeding tube every 8 (eight) hours. 08/27/16   Leroy Sea, MD    Family History Family History  Problem Relation Age of Onset  . Diabetes Mellitus II Mother   . Stroke Mother     Social History Social History   Tobacco Use  . Smoking status: Never Smoker  . Smokeless tobacco: Never Used  Substance Use Topics  . Alcohol use: No  . Drug use: No     Allergies   Codeine; Levofloxacin; and Penicillins   Review of Systems Review of Systems  Gastrointestinal: Positive for abdominal pain.  Genitourinary: Positive for flank pain.  Musculoskeletal: Positive for back pain.  All other systems reviewed and are negative.    Physical Exam Updated Vital Signs BP (!) 124/29   Pulse 77   Temp 98.3 F (36.8 C) (Oral)   Resp 13   SpO2 91%   Physical Exam  Constitutional: She is oriented to person, place, and time. She appears well-developed and well-nourished. No distress.  HENT:  Head: Normocephalic and atraumatic.  Right Ear: Hearing normal.  Left Ear: Hearing normal.  Nose: Nose normal.  Mouth/Throat: Oropharynx is clear and moist and mucous membranes are normal.  Eyes: Conjunctivae and EOM are normal. Pupils are equal, round, and reactive to light.  Neck: Normal range of motion. Neck supple.  Cardiovascular: Regular rhythm, S1 normal and S2 normal. Exam reveals no gallop and  no friction rub.  No murmur heard. Pulmonary/Chest: Effort normal and breath sounds normal. No respiratory distress. She exhibits no tenderness.  Abdominal: Soft. Normal appearance and bowel sounds are normal. There is no hepatosplenomegaly. There is generalized tenderness. There is no rebound, no guarding, no tenderness at McBurney's point and negative Murphy's sign. No hernia.  Musculoskeletal: Normal range of motion.  Lumbar back: She exhibits tenderness.       Back:  Neurological: She is alert and oriented to person, place, and time. She has normal strength. No cranial nerve deficit or sensory deficit. Coordination normal. GCS eye subscore is 4. GCS verbal subscore is 5. GCS motor subscore is 6.  Skin: Skin is warm, dry and intact. No rash noted. No cyanosis.  Psychiatric: She has a normal mood and affect. Her speech is normal and behavior is normal. Thought content normal.  Nursing note and vitals reviewed.    ED Treatments / Results  Labs (all labs ordered are listed, but only abnormal results are displayed) Labs Reviewed  COMPREHENSIVE METABOLIC PANEL - Abnormal; Notable for the following components:      Result Value   Potassium 3.2 (*)    Chloride 94 (*)    Glucose, Bld 179 (*)    Creatinine, Ser 3.35 (*)    ALT 11 (*)    Alkaline Phosphatase 195 (*)    GFR calc non Af Amer 15 (*)    GFR calc Af Amer 17 (*)    Anion gap 17 (*)    All other components within normal limits  CBC - Abnormal; Notable for the following components:   Platelets 98 (*)    All other components within normal limits  URINALYSIS, ROUTINE W REFLEX MICROSCOPIC - Abnormal; Notable for the following components:   APPearance HAZY (*)    pH 9.0 (*)    Glucose, UA 150 (*)    Hgb urine dipstick LARGE (*)    Protein, ur 100 (*)    Leukocytes, UA SMALL (*)    Bacteria, UA RARE (*)    Squamous Epithelial / LPF 0-5 (*)    All other components within normal limits  URINE CULTURE  LIPASE, BLOOD     EKG  EKG Interpretation None       Radiology Ct Renal Stone Study  Result Date: 06/20/2017 CLINICAL DATA:  57 year old female with right flank pain. Concern for kidney stone. History of diabetes and renal failure. EXAM: CT ABDOMEN AND PELVIS WITHOUT CONTRAST TECHNIQUE: Multidetector CT imaging of the abdomen and pelvis was performed following the standard protocol without IV contrast. COMPARISON:  Abdominal CT dated 05/29/2017 FINDINGS: Evaluation of this exam is limited in the absence of intravenous contrast. Lower chest: The visualized lung bases are clear. There is coronary vascular calcification. No intra-abdominal free air. Trace free fluid adjacent to the liver. Hepatobiliary: The liver is unremarkable. No intrahepatic biliary ductal dilatation. Cholecystectomy. Pancreas: Unremarkable. No pancreatic ductal dilatation or surrounding inflammatory changes. Spleen: Normal in size without focal abnormality. Adrenals/Urinary Tract: The adrenal glands are unremarkable. Moderate bilateral renal atrophy. No hydronephrosis on either side. Extensive renal vascular calcification. The visualized ureters are unremarkable. The urinary bladder is collapsed. Stomach/Bowel: There is moderate stool throughout the colon. There are small scattered colonic diverticula primarily involving the ascending colon. No active inflammatory changes. There is no bowel obstruction or active inflammation. Normal appendix. Vascular/Lymphatic: Advanced atherosclerotic calcification of the aorta, iliac and mesenteric arteries. No portal venous gas. There is no adenopathy. Reproductive: The uterus and the ovaries are grossly unremarkable. No pelvic mass. Other: Probable postsurgical changes of ventral hernia repair. There is diffuse stranding of the subcutaneous soft tissues, increased compared the prior CT. There is subcutaneous soft tissue thickening of the umbilicus. No fluid collection. Musculoskeletal: Osteopenia with  multilevel degenerative changes of the spine. Old compression fracture of the superior endplate of L4 as well as  multilevel superior endplate Schmorl's node similar to prior CT. No acute fracture. IMPRESSION: 1. Moderate renal atrophy.  No hydronephrosis or nephrolithiasis. 2. Moderate colonic stool burden. No bowel obstruction or active inflammation. Normal appendix. 3. Scattered colonic diverticulosis without active inflammatory changes. 4. Diffuse subcutaneous edema and small amount of perihepatic free fluid. 5. Advanced Aortic Atherosclerosis (ICD10-I70.0). Electronically Signed   By: Elgie Collard M.D.   On: 06/20/2017 03:31    Procedures Procedures (including critical care time)  Medications Ordered in ED Medications  fentaNYL (SUBLIMAZE) injection 50 mcg (50 mcg Intravenous Given 06/20/17 0200)  ondansetron (ZOFRAN) injection 4 mg (4 mg Intravenous Given 06/20/17 0218)     Initial Impression / Assessment and Plan / ED Course  I have reviewed the triage vital signs and the nursing notes.  Pertinent labs & imaging results that were available during my care of the patient were reviewed by me and considered in my medical decision making (see chart for details).     Patient presents to the emergency department for evaluation of back pain, right flank pain, lower abdominal pain.  Patient did have diffuse abdominal tenderness without guarding, no signs of peritonitis.  The right flank and back area was also tender to the touch and pain worsens with movement.  Examination was most consistent with musculoskeletal, however workup was pursued.  Patient does make urine but not daily.  A catheterized specimen did reveal gross blood.  Reviewing previous urinalysis, this has been consistent with each of the last several.  A culture has been obtained.  A CT scan was performed to evaluate for the possibility of kidney stone.  No acute pathology was noted on CT scan.  Patient was administered a single dose  of analgesia and her pain is resolved.  She will be appropriate for discharge.  Final Clinical Impressions(s) / ED Diagnoses   Final diagnoses:  Acute right-sided low back pain without sciatica    ED Discharge Orders    None       Gilda Crease, MD 06/20/17 [phone]

## 2017-06-21 LAB — URINE CULTURE: Culture: NO GROWTH

## 2017-06-21 NOTE — Progress Notes (Signed)
[facility] - Clinic Note  06/22/2017     CHIEF COMPLAINT Patient presents for Retina Follow Up   HISTORY OF PRESENT ILLNESS: [name] is a 57 y.o. female who presents to the clinic today for:   HPI    Retina Follow Up    Patient presents with  Other.  In both eyes.  Severity is mild.  Since onset it is gradually improving.  I, the attending physician,  performed the HPI with the patient and updated documentation appropriately.          Comments    F/U PDR OU: S/P PRP OU. POA at patients side, patient states she has constant lines and dots in visual field, she states she feels her vision has improved. Denies ocular pain. Bs check this am unsure results but no insulin coverage. Denies eye gtts, takes mutivitQd       Last edited by Rennis Chris, MD on 06/22/2017  9:04 AM. (History)       Referring physician: Terrial Rhodes, MD [address] [place], [place] [address]  HISTORICAL INFORMATION:   Selected notes from the MEDICAL RECORD NUMBER Referred by Dr. Hansel Feinstein (Nephrologist) for DM exam;  LEE-  Ocular Hx-  PMH- DM    CURRENT MEDICATIONS: Current Outpatient Medications (Ophthalmic Drugs)  Medication Sig  . prednisoLONE acetate (PRED FORTE) 1 % ophthalmic suspension Place 1 drop into the right eye 4 (four) times daily. (Patient not taking: Reported on 06/22/2017)  . Propylene Glycol-Glycerin (ARTIFICIAL TEARS) 1-0.3 % SOLN Place 1 drop into both eyes every 12 (twelve) hours as needed (for eye iritation).   No current facility-administered medications for this visit.  (Ophthalmic Drugs)   Current Outpatient Medications (Other)  Medication Sig  . aspirin 81 MG chewable tablet Place 1 tablet (81 mg total) into feeding tube daily. (Patient taking differently: Chew 81 mg by mouth daily. )  . atorvastatin (LIPITOR) 10 MG tablet Take 10 mg by mouth daily.  . Cholecalciferol (VITAMIN D-3) 5000 units TABS Take 5,000 Units by mouth daily.  Marland Kitchen  gabapentin (NEURONTIN) 100 MG capsule Take 100 mg by mouth at bedtime.  Marland Kitchen glucagon (GLUCAGON EMERGENCY) 1 MG injection Inject 1 mg into the vein once as needed (for blood sugar).  Marland Kitchen glycerin adult 2 g suppository Place 1 suppository rectally as needed for constipation.  Marland Kitchen guaiFENesin (ROBITUSSIN) 100 MG/5ML SOLN Take 20 mLs by mouth every 8 (eight) hours as needed for cough or to loosen phlegm.  Marland Kitchen HYDROcodone-acetaminophen (NORCO/VICODIN) 5-325 MG tablet Take 1-2 tablets by mouth every 6 (six) hours as needed.  . hydrocortisone cream 1 % Apply 1 application topically every 8 (eight) hours as needed for itching.  . Infant Care Products (DERMACLOUD) CREA Apply topically 3 (three) times daily. To buttocks  . insulin aspart (NOVOLOG) 100 UNIT/ML injection Inject 2-10 Units into the skin 3 (three) times daily before meals. Per sliding scale 0-59: notify doctor 60-100= 0 units 101-199 = 2 units 200-299 = 6 units 300-349 = 8 units 350-399 = 10 units 400-100 = Notify Doctor  . lamoTRIgine (LAMICTAL) 25 MG tablet Take 25 mg by mouth at bedtime.  Marland Kitchen levothyroxine (SYNTHROID, LEVOTHROID) 25 MCG tablet Place 1 tablet (25 mcg total) into feeding tube daily before breakfast. (Patient taking differently: Take 25 mcg by mouth daily before breakfast. )  . Melatonin-Pyridoxine 3-1 MG TABS Take 1 tablet by mouth at bedtime.  . mirtazapine (REMERON) 30 MG tablet Take 30 mg by mouth  at bedtime.  . nitrofurantoin, macrocrystal-monohydrate, (MACROBID) 100 MG capsule Take 1 capsule (100 mg total) by mouth 2 (two) times daily. X 7 days  . Nutritional Supplements (FEEDING SUPPLEMENT, NEPRO CARB STEADY,) LIQD Place 360 mLs into feeding tube 3 (three) times daily with meals.  . ondansetron (ZOFRAN) 4 MG tablet Take 4 mg by mouth every 8 (eight) hours as needed for nausea or vomiting.  . pantoprazole (PROTONIX) 40 MG tablet Take 40 mg by mouth every evening.   . polyethylene glycol (MIRALAX / GLYCOLAX) packet Take 17 g by  mouth daily.  . polyethylene glycol (MIRALAX) packet Take 17 g by mouth daily.  . predniSONE (DELTASONE) 50 MG tablet Take 1 tablet daily x 5 days.  . Probiotic Product (PROBIOTIC PO) Take 250 mg by mouth 2 (two) times daily.  . promethazine (PHENERGAN) 6.25 MG/5ML syrup Take 12.5 mg by mouth every 6 (six) hours as needed for nausea or vomiting.  . senna-docusate (SENOKOT-S) 8.6-50 MG tablet Take 2 tablets by mouth at bedtime.   . sevelamer carbonate (RENVELA) 800 MG tablet Take 400 mg by mouth 3 (three) times daily with meals.  . sucralfate (CARAFATE) 1 g tablet Take 1 g by mouth 4 (four) times daily.  . traMADol (ULTRAM) 50 MG tablet Take 1 tablet (50 mg total) by mouth every 12 (twelve) hours as needed for moderate pain. (Patient taking differently: Take 50 mg by mouth every 8 (eight) hours as needed for moderate pain. )  . triamcinolone (KENALOG) 0.025 % cream Apply 1 application topically 2 (two) times daily.  . Water For Irrigation, Sterile (FREE WATER) SOLN Place 100 mLs into feeding tube every 8 (eight) hours.  Weyman Croon Hazel (PREPARATION H TOTABLES WIPES) 50 % PADS Apply topically every 8 (eight) hours as needed (for hemmorrhoids).  . neomycin-bacitracin-polymyxin (NEOSPORIN) ointment Apply 1 application topically daily.   No current facility-administered medications for this visit.  (Other)      REVIEW OF SYSTEMS: ROS    Positive for: Neurological, Musculoskeletal, Endocrine, Eyes, Psychiatric   Negative for: Constitutional, Gastrointestinal, Skin, Genitourinary, HENT, Cardiovascular, Respiratory, Allergic/Imm, Heme/Lymph   Last edited by Eldridge Scot, LPN on 5/78/4696  8:14 AM. (History)       ALLERGIES Allergies  Allergen Reactions  . Codeine Hives  . Levofloxacin Other (See Comments)    Unknown, but noted on MAR  . Penicillins Hives    Has patient had a PCN reaction causing immediate rash, facial/tongue/throat swelling, SOB or lightheadedness with hypotension:  Yes Has patient had a PCN reaction causing severe rash involving mucus membranes or skin necrosis: Unk Has patient had a PCN reaction that required hospitalization: Unk Has patient had a PCN reaction occurring within the last 10 years: Unk If all of the above answers are "NO", then may proceed with Cephalosporin use.     PAST MEDICAL HISTORY Past Medical History:  Diagnosis Date  . Anemia   . Blood transfusion   . Chronic back pain   . Dementia   . Diabetes mellitus   . Hemodialysis patient (HCC) 03/15/11   "Tues; Thurs; Sat; [facility]"  . Hypertension   . PEA (Pulseless electrical activity) (HCC)   . Peripheral vascular disease (HCC)   . Rash 03/15/11   "admitted me to find out what its' from; UE, waist, thighs, groin"  . Renal failure    hd 4/12  . Stroke [facility])    Past Surgical History:  Procedure Laterality Date  . AMPUTATION  03/04/2011  Procedure: AMPUTATION DIGIT;  Surgeon: [name], MD;  Location: [facility] OR;  Service: Orthopedics;  Laterality: Right;  RT GREAT TOE AMP  . AMPUTATION Right 03/13/2015   Procedure: RIGHT  ABOVE KNEE AMPUTATION;  Surgeon: Pryor Ochoa, MD;  Location: [facility] OR;  Service: Vascular;  Laterality: Right;  . ARTERIOVENOUS GRAFT PLACEMENT  10/01/10   left upper arm  . BELOW KNEE LEG AMPUTATION  2004   left  . CARPAL TUNNEL RELEASE  ~ 2009   left wrist  . CATARACT EXTRACTION W/ INTRAOCULAR LENS IMPLANT  12/2009   right eye  . HAND AMPUTATION Left   . INSERTION OF DIALYSIS CATHETER  07/29/10   right subclavian  . IR FLUORO RM 30-60 MIN  08/16/2016  . IR GASTROSTOMY TUBE MOD SED  08/23/2016  . IR GASTROSTOMY TUBE REMOVAL  02/07/2017  . TEE WITHOUT CARDIOVERSION  03/18/2011   Procedure: TRANSESOPHAGEAL ECHOCARDIOGRAM (TEE);  Surgeon: [name], MD;  Location: [facility] ENDOSCOPY;  Service: Cardiovascular;  Laterality: N/A;    FAMILY HISTORY Family History  Problem Relation Age of Onset  . Diabetes Mellitus II Mother   . Stroke  Mother     SOCIAL HISTORY Social History   Tobacco Use  . Smoking status: Never Smoker  . Smokeless tobacco: Never Used  Substance Use Topics  . Alcohol use: No  . Drug use: No         OPHTHALMIC EXAM:  Base Eye Exam    Visual Acuity (Snellen - Linear)      Right Left   Dist [place] 20/40 -1 20/50   Dist ph [place] NI 20/40 -2       Tonometry (Tonopen, 8:22 AM)      Right Left   Pressure 17 17       Pupils      Dark Light Shape React APD   Right 2 1.5 Round Sluggish None   Left 2 1.5 Round Sluggish None       Visual Fields (Counting fingers)      Left Right    Full Full       Extraocular Movement      Right Left    Full, Ortho Full, Ortho       Neuro/Psych    Oriented x3:  Yes   Mood/Affect:  Normal       Dilation    Both eyes:  1.0% Mydriacyl, 2.5% Phenylephrine @ 8:22 AM        Slit Lamp and Fundus Exam    External Exam      Right Left   External wheelchair bound; bilateral lower limb amputee wheelchair bound; bilateral lower limb amputee       Slit Lamp Exam      Right Left   Lids/Lashes Dermatochalasis - upper lid, Telangiectasia of upper lid Dermatochalasis - upper lid, Telangiectasia of upper lid   Conjunctiva/Sclera Nasal Pinguecula Nasal and temproal Pinguecula   Cornea Clear Clear   Anterior Chamber Deep and quiet moderatre depth   Iris Round and moderate dilated, No NVI Round and dilated, No NVI   Lens  PC IOL in good position, Mild anterior capsule fimosis 2+ Nuclear sclerosis, 2+ Cortical cataract, pigment at 0700 anterior capsule   Vitreous Vitreous syneresis Vitreous syneresis       Fundus Exam      Right Left   Disc peripapillary pigmentation temporally, fibrosis overlying disc - regressing, +NVD - regressing Normal   C/D Ratio obscured by fibrosis 0.5  Macula Flat, ERM, scattered MAs; no edema Blunted foveal reflex, Flat, scattered MAs or edema, Retinal pigment epithelial mottling   Vessels Vascular attenuation; peripheral  sclerosis Vascular attenuation; peripheral sclerosis   Periphery large pre-retinal hemorrhage inferiorly at 0700 - shrinking, 360 scattered dot hemes, 360 PRP with good laser scars room for fill-in attached; few Mas, 360 PRP with good scaring, peripheral patches with room for fill-in          IMAGING AND PROCEDURES  Imaging and Procedures for 06/22/17  OCT, Retina - OU - Both Eyes     Right Eye Quality was borderline. Central Foveal Thickness: 234. Progression has been stable. Findings include normal foveal contour, no IRF, no SRF, epiretinal membrane (Diffuse atrophy temporally).   Left Eye Quality was borderline. Central Foveal Thickness: 262. Progression has been stable. Findings include normal foveal contour, epiretinal membrane, no IRF, no SRF (Diffuse atrophy temporally ).   Notes *Images captured and stored on drive  Diagnosis / Impression:  No DME OU  Clinical management:  See below  Abbreviations: NFP - Normal foveal profile. CME - cystoid macular edema. PED - pigment epithelial detachment. IRF - intraretinal fluid. SRF - subretinal fluid. EZ - ellipsoid zone. ERM - epiretinal membrane. ORA - outer retinal atrophy. ORT - outer retinal tubulation. SRHM - subretinal hyper-reflective material         Panretinal Photocoagulation - OD - Right Eye     LASER PROCEDURE NOTE  Diagnosis:   Proliferative Diabetic Retinopathy, RIGHT EYE  Procedure:  Pan-retinal photocoagulation using slit lamp laser, RIGHT EYE, fill-in  Anesthesia:  Topical  Surgeon: Rennis Chris, MD, PhD   Informed consent obtained, operative eye marked, and time out performed prior to initiation of laser.   Lumenis Smart532 slit lamp laser Pattern: 2x2 square, 3x3 square Power: 320 mW Duration: 50 msec  Spot size: 200 microns  # spots: 951 spots 360 fill-in  Complications: None.  Notes: significant preretinal/vitreous heme obscuring view and preventing laser up take inferiorly  RTC: 3-4 wks  for PRP, OS  Patient tolerated the procedure well and received written and verbal post-procedure care information/education.                ASSESSMENT/PLAN:    ICD-10-CM   1. 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 Retinal edema H35.81 OCT, Retina - OU - Both Eyes  4. Essential hypertension I10   5. Hypertensive retinopathy of both eyes H35.033   6. Pseudophakia, right eye Z96.1   7. Combined forms of age-related cataract, left eye H25.812   8. Proliferative diabetic retinopathy of right eye without macular edema associated with type 2 diabetes mellitus (HCC) A54.0981 Panretinal Photocoagulation - OD - Right Eye  9. Moderate nonproliferative diabetic retinopathy of left eye without macular edema associated with type 2 diabetes mellitus (HCC) X91.4782     1-3. Proliferative diabetic retinopathy w/o macular edema, both eyes The incidence, risk factors for progression, natural history and treatment options for diabetic retinopathy  were discussed with patient.  The need for close monitoring of blood glucose, blood pressure, and serum lipids, avoiding cigarette or any type of tobacco, and the need for long term follow up was also discussed with patient. - S/p PRP OD (01.17.19) - S/p PRP OS (01.31.19) - early fibrosis overlying disc OD with fine NVD --  regressing - large preretinal hemorrhage inferiorly ?ruptured RAM? -- improving - OCT stable without diabetic macular edema, both eyes - recommend PRP fill-in OD today (02.28.19) - pt wishes to proceed with PRP laser fill-in OD today (02.28.19) - RBA of procedure discussed, questions answered - informed consent obtained and signed by POA - see procedure note - pt tolerated well - start PF QID OD x7 days - f/u in 3-4 wks - PRP fill-in  OS  4,5. Hypertensive retinopathy OU - discussed importance of tight BP control - monitor  6. Pseudophakia OD  - s/p CE/IOL OD -- pt unable to recall surgeon  - stable, doing well  - monitor  7. Nuclear and cortical cataract OS - The symptoms of cataract, surgical options, and treatments and risks were discussed with patient. - discussed diagnosis and progression - approaching visual significance - monitor for now   Ophthalmic Meds Ordered this visit:  No orders of the defined types were placed in this encounter.      Return in about 4 weeks (around 07/20/2017) for 3-4 wks, Dilated Exam.  There are no Patient Instructions on file for this visit.   Explained the diagnoses, plan, and follow up with the patient and they expressed understanding.  Patient expressed understanding of the importance of proper follow up care.   This document serves as a record of services personally performed by Karie ChimeraBrian G. Takiya Belmares, MD, PhD. It was created on their behalf by Virgilio BellingMeredith Fabian, COA, a certified ophthalmic assistant. The creation of this record is the provider's dictation and/or activities during the visit.  Electronically signed by: Virgilio BellingMeredith Fabian, COA  06/22/17 10:13 AM    Karie ChimeraBrian G. Ivory Bail, M.D., Ph.D. Diseases & Surgery of the Retina and Vitreous [facility] 06/22/17   I have reviewed the above documentation for accuracy and completeness, and I agree with the above. Karie ChimeraBrian G. Quavon Keisling, M.D., Ph.D. 06/22/17 10:14 AM     Abbreviations: M myopia (nearsighted); A astigmatism; H hyperopia (farsighted); P presbyopia; Mrx spectacle prescription;  CTL contact lenses; OD right eye; OS left eye; OU both eyes  XT exotropia; ET esotropia; PEK punctate epithelial keratitis; PEE punctate epithelial erosions; DES dry eye syndrome; MGD meibomian gland dysfunction; ATs artificial tears; PFAT's preservative free artificial tears; NSC nuclear sclerotic cataract; PSC posterior  subcapsular cataract; ERM epi-retinal membrane; PVD posterior vitreous detachment; RD retinal detachment; DM diabetes mellitus; DR diabetic retinopathy; NPDR non-proliferative diabetic retinopathy; PDR proliferative diabetic retinopathy; CSME clinically significant macular edema; DME diabetic macular edema; dbh dot blot hemorrhages; CWS cotton wool spot; POAG primary open angle glaucoma; C/D cup-to-disc ratio; HVF humphrey visual field; GVF goldmann visual field; OCT optical coherence tomography; IOP intraocular pressure; BRVO Branch retinal vein occlusion; CRVO central retinal vein occlusion; CRAO central retinal artery occlusion; BRAO branch retinal artery occlusion; RT retinal tear; SB scleral buckle; PPV pars plana vitrectomy; VH Vitreous hemorrhage; PRP panretinal laser photocoagulation; IVK intravitreal kenalog; VMT vitreomacular traction; MH Macular hole;  NVD neovascularization of the disc; NVE neovascularization elsewhere; AREDS age related eye disease study; ARMD age related macular degeneration; POAG primary open angle glaucoma; EBMD epithelial/anterior basement membrane dystrophy; ACIOL anterior chamber intraocular lens; IOL intraocular lens; PCIOL posterior chamber intraocular lens; Phaco/IOL phacoemulsification with intraocular lens placement; PRK photorefractive keratectomy; LASIK laser assisted in situ keratomileusis; HTN hypertension; DM diabetes mellitus; COPD chronic obstructive pulmonary disease

## 2017-06-22 ENCOUNTER — Ambulatory Visit (INDEPENDENT_AMBULATORY_CARE_PROVIDER_SITE_OTHER): Payer: Medicare Other | Admitting: Ophthalmology

## 2017-06-22 ENCOUNTER — Encounter (INDEPENDENT_AMBULATORY_CARE_PROVIDER_SITE_OTHER): Payer: Self-pay | Admitting: Ophthalmology

## 2017-06-22 DIAGNOSIS — E113593 Type 2 diabetes mellitus with proliferative diabetic retinopathy without macular edema, bilateral: Secondary | ICD-10-CM

## 2017-06-22 DIAGNOSIS — H35049 Retinal micro-aneurysms, unspecified, unspecified eye: Secondary | ICD-10-CM | POA: Diagnosis not present

## 2017-06-22 DIAGNOSIS — E113392 Type 2 diabetes mellitus with moderate nonproliferative diabetic retinopathy without macular edema, left eye: Secondary | ICD-10-CM

## 2017-06-22 DIAGNOSIS — Z961 Presence of intraocular lens: Secondary | ICD-10-CM | POA: Diagnosis not present

## 2017-06-22 DIAGNOSIS — E113591 Type 2 diabetes mellitus with proliferative diabetic retinopathy without macular edema, right eye: Secondary | ICD-10-CM

## 2017-06-22 DIAGNOSIS — H25812 Combined forms of age-related cataract, left eye: Secondary | ICD-10-CM | POA: Diagnosis not present

## 2017-06-22 DIAGNOSIS — H35033 Hypertensive retinopathy, bilateral: Secondary | ICD-10-CM | POA: Diagnosis not present

## 2017-06-22 DIAGNOSIS — I10 Essential (primary) hypertension: Secondary | ICD-10-CM | POA: Diagnosis not present

## 2017-06-22 DIAGNOSIS — E11319 Type 2 diabetes mellitus with unspecified diabetic retinopathy without macular edema: Secondary | ICD-10-CM | POA: Diagnosis not present

## 2017-06-22 DIAGNOSIS — H3581 Retinal edema: Secondary | ICD-10-CM | POA: Diagnosis not present

## 2017-07-25 NOTE — Progress Notes (Signed)
[facility] - Clinic Note  07/27/2017     CHIEF COMPLAINT Patient presents for Retina Follow Up   HISTORY OF PRESENT ILLNESS: [name] is a 57 y.o. female who presents to the clinic today for:   HPI    Retina Follow Up    Patient presents with  Other.  In both eyes.  This started 2 months ago.  Severity is mild.  Since onset it is stable.  I, the attending physician,  performed the HPI with the patient and updated documentation appropriately.          Comments    F/U PDR OU.Patient accompanied by POA today. Patient states her vision is about the same , constant floaters OS. Denies flashes ,glare and ocular pain. Bs are controled by insulin. Bs unknown. Pt went to ER last night, she was dx with UTI.       Last edited by Eldridge Scot, LPN on 4/0/9811  8:22 AM. (History)       Referring physician: Terrial Rhodes, MD [address] [place], [place] [address]  HISTORICAL INFORMATION:   Selected notes from the MEDICAL RECORD NUMBER Referred by Dr. Hansel Feinstein (Nephrologist) for DM exam;  LEE-  Ocular Hx-  PMH- DM    CURRENT MEDICATIONS: Current Outpatient Medications (Ophthalmic Drugs)  Medication Sig  . prednisoLONE acetate (PRED FORTE) 1 % ophthalmic suspension Place 1 drop into the right eye 4 (four) times daily.  Marland Kitchen Propylene Glycol-Glycerin (ARTIFICIAL TEARS) 1-0.3 % SOLN Place 1 drop into both eyes every 12 (twelve) hours as needed (for eye iritation).   No current facility-administered medications for this visit.  (Ophthalmic Drugs)   Current Outpatient Medications (Other)  Medication Sig  . aspirin 81 MG chewable tablet Place 1 tablet (81 mg total) into feeding tube daily. (Patient taking differently: Chew 81 mg by mouth daily. )  . atorvastatin (LIPITOR) 10 MG tablet Take 10 mg by mouth daily.  . cephALEXin (KEFLEX) 500 MG capsule Take 1 capsule (500 mg total) by mouth 2 (two) times daily for 7 days.  . Cholecalciferol (VITAMIN  D-3) 5000 units TABS Take 5,000 Units by mouth daily.  . cinacalcet (SENSIPAR) 30 MG tablet Take 30 mg by mouth at bedtime.  . docusate sodium (COLACE) 50 MG capsule Take 50 mg by mouth every evening.  . gabapentin (NEURONTIN) 100 MG capsule Take 100 mg by mouth at bedtime.  Marland Kitchen glucagon (GLUCAGON EMERGENCY) 1 MG injection Inject 1 mg into the vein once as needed (for blood sugar).  Marland Kitchen glycerin adult 2 g suppository Place 1 suppository rectally as needed for constipation.  Marland Kitchen guaiFENesin (ROBITUSSIN) 100 MG/5ML SOLN Take 20 mLs by mouth every 8 (eight) hours as needed for cough or to loosen phlegm.  Marland Kitchen HYDROcodone-acetaminophen (NORCO/VICODIN) 5-325 MG tablet Take 1-2 tablets by mouth every 6 (six) hours as needed.  . hydrocortisone cream 1 % Apply 1 application topically every 8 (eight) hours as needed for itching.  . Infant Care Products (DERMACLOUD) CREA Apply topically 3 (three) times daily. To buttocks  . insulin aspart (NOVOLOG) 100 UNIT/ML injection Inject 2-10 Units into the skin 3 (three) times daily before meals. Per sliding scale 0-59: notify doctor 60-100= 0 units 101-199 = 2 units 200-299 = 6 units 300-349 = 8 units 350-399 = 10 units 400-100 = Notify Doctor  . lamoTRIgine (LAMICTAL) 25 MG tablet Take 25 mg by mouth at bedtime.  Marland Kitchen levothyroxine (SYNTHROID, LEVOTHROID) 25 MCG tablet Place 1 tablet (  25 mcg total) into feeding tube daily before breakfast. (Patient taking differently: Take 25 mcg by mouth daily before breakfast. )  . Melatonin-Pyridoxine 3-1 MG TABS Take 1 tablet by mouth at bedtime.  . mirtazapine (REMERON) 30 MG tablet Take 30 mg by mouth at bedtime.  Marland Kitchen neomycin-bacitracin-polymyxin (NEOSPORIN) ointment Apply 1 application topically daily.  . nitrofurantoin, macrocrystal-monohydrate, (MACROBID) 100 MG capsule Take 1 capsule (100 mg total) by mouth 2 (two) times daily. X 7 days  . Nutritional Supplements (FEEDING SUPPLEMENT, NEPRO CARB STEADY,) LIQD Place 360 mLs into  feeding tube 3 (three) times daily with meals.  . ondansetron (ZOFRAN) 4 MG tablet Take 4 mg by mouth every 8 (eight) hours as needed for nausea or vomiting.  . pantoprazole (PROTONIX) 40 MG tablet Take 40 mg by mouth every evening.   . polyethylene glycol (MIRALAX) packet Take 17 g by mouth daily.  . predniSONE (DELTASONE) 50 MG tablet Take 1 tablet daily x 5 days.  . Probiotic Product (PROBIOTIC PO) Take 250 mg by mouth 2 (two) times daily.  . promethazine (PHENERGAN) 6.25 MG/5ML syrup Take 12.5 mg by mouth every 6 (six) hours as needed for nausea or vomiting.  . senna-docusate (SENOKOT-S) 8.6-50 MG tablet Take 2 tablets by mouth at bedtime.   . sevelamer carbonate (RENVELA) 800 MG tablet Take 400 mg by mouth 3 (three) times daily with meals.  . sucralfate (CARAFATE) 1 g tablet Take 1 g by mouth 4 (four) times daily.  . traMADol (ULTRAM) 50 MG tablet Take 1 tablet (50 mg total) by mouth every 12 (twelve) hours as needed for moderate pain. (Patient taking differently: Take 50 mg by mouth every 8 (eight) hours as needed for moderate pain. )  . triamcinolone (KENALOG) 0.025 % cream Apply 1 application topically 2 (two) times daily.  . Water For Irrigation, Sterile (FREE WATER) SOLN Place 100 mLs into feeding tube every 8 (eight) hours.  Weyman Croon Hazel (PREPARATION H TOTABLES WIPES) 50 % PADS Apply topically every 8 (eight) hours as needed (for hemmorrhoids).   No current facility-administered medications for this visit.  (Other)      REVIEW OF SYSTEMS: ROS    Positive for: Neurological, Genitourinary, Musculoskeletal, Endocrine, Cardiovascular, Eyes   Negative for: Constitutional, Gastrointestinal, Skin, HENT, Respiratory, Allergic/Imm, Heme/Lymph   Last edited by Eldridge Scot, LPN on 04/30/1094  8:18 AM. (History)       ALLERGIES Allergies  Allergen Reactions  . Codeine Hives  . Levofloxacin Other (See Comments)    Unknown, but noted on MAR  . Penicillins Hives    Has patient had  a PCN reaction causing immediate rash, facial/tongue/throat swelling, SOB or lightheadedness with hypotension: Yes Has patient had a PCN reaction causing severe rash involving mucus membranes or skin necrosis: Unk Has patient had a PCN reaction that required hospitalization: Unk Has patient had a PCN reaction occurring within the last 10 years: Unk If all of the above answers are "NO", then may proceed with Cephalosporin use.     PAST MEDICAL HISTORY Past Medical History:  Diagnosis Date  . Anemia   . Blood transfusion   . Chronic back pain   . Dementia   . Diabetes mellitus   . Hemodialysis patient (HCC) 03/15/11   "Tues; Thurs; Sat; [facility]"  . Hypertension   . PEA (Pulseless electrical activity) (HCC)   . Peripheral vascular disease (HCC)   . Rash 03/15/11   "admitted me to find out what its' from; UE,  waist, thighs, groin"  . Renal failure    hd 4/12  . Stroke [facility])    Past Surgical History:  Procedure Laterality Date  . AMPUTATION  03/04/2011   Procedure: AMPUTATION DIGIT;  Surgeon: [name], MD;  Location: [facility] OR;  Service: Orthopedics;  Laterality: Right;  RT GREAT TOE AMP  . AMPUTATION Right 03/13/2015   Procedure: RIGHT  ABOVE KNEE AMPUTATION;  Surgeon: Pryor Ochoa, MD;  Location[facility] OR;  Service: Vascular;  Laterality: Right;  . ARTERIOVENOUS GRAFT PLACEMENT  10/01/10   left upper arm  . BELOW KNEE LEG AMPUTATION  2004   left  . CARPAL TUNNEL RELEASE  ~ 2009   left wrist  . CATARACT EXTRACTION W/ INTRAOCULAR LENS IMPLANT  12/2009   right eye  . HAND AMPUTATION Left   . INSERTION OF DIALYSIS CATHETER  07/29/10   right subclavian  . IR FLUORO RM 30-60 MIN  08/16/2016  . IR GASTROSTOMY TUBE MOD SED  08/23/2016  . IR GASTROSTOMY TUBE REMOVAL  02/07/2017  . TEE WITHOUT CARDIOVERSION  03/18/2011   Procedure: TRANSESOPHAGEAL ECHOCARDIOGRAM (TEE);  Surgeon: [name], MD;  Location: [facility] ENDOSCOPY;  Service: Cardiovascular;  Laterality: N/A;     FAMILY HISTORY Family History  Problem Relation Age of Onset  . Diabetes Mellitus II Mother   . Stroke Mother     SOCIAL HISTORY Social History   Tobacco Use  . Smoking status: Never Smoker  . Smokeless tobacco: Never Used  Substance Use Topics  . Alcohol use: No  . Drug use: No         OPHTHALMIC EXAM:  Base Eye Exam    Visual Acuity (Snellen - Linear)      Right Left   Dist [place] 20/60 20/60 -1   Dist ph [place] 20/50 20/50       Tonometry (Tonopen, 8:18 AM)      Right Left   Pressure 23 18       Tonometry #2 (Tonopen, 8:18 AM)      Right Left   Pressure 22        Pupils      Dark Light Shape React APD   Right 3 2.5 Round Minimal None   Left 4 3.5 Round Minimal None       Visual Fields (Counting fingers)      Left Right    Full Full       Extraocular Movement      Right Left    Full, Ortho Full, Ortho       Neuro/Psych    Oriented x3:  Yes   Mood/Affect:  Normal       Dilation    Both eyes:  1.0% Mydriacyl, 2.5% Phenylephrine @ 8:18 AM        Slit Lamp and Fundus Exam    External Exam      Right Left   External wheelchair bound; bilateral lower limb amputee wheelchair bound; bilateral lower limb amputee       Slit Lamp Exam      Right Left   Lids/Lashes Dermatochalasis - upper lid, Telangiectasia of upper lid Dermatochalasis - upper lid, Telangiectasia of upper lid   Conjunctiva/Sclera Nasal Pinguecula Nasal and temproal Pinguecula   Cornea Clear Clear   Anterior Chamber Deep and quiet moderatre depth   Iris Round and moderate dilated, No NVI Round and dilated, No NVI   Lens  PC IOL in good position, Mild anterior capsule fimosis 2+ Nuclear  sclerosis, 2+ Cortical cataract, pigment at 0700 anterior capsule   Vitreous Vitreous syneresis Vitreous syneresis       Fundus Exam      Right Left   Disc peripapillary pigmentation temporally, fibrosis overlying vessels, +NVD - regressing Normal   C/D Ratio obscured by fibrosis 0.5   Macula  Flat, Blunted foveal reflex, scattered Mas - improved; no edema, Superior Epiretinal membrane Blunted foveal reflex, Flat, scattered Mas, no edema, Retinal pigment epithelial mottling   Vessels Vascular attenuation; peripheral sclerosis Vascular attenuation; peripheral sclerosis   Periphery large pre-retinal hemorrhage inferiorly at 0700 - shrinking, 360 scattered dot hemes, 360 PRP with good laser scars attached; few Mas, 360 PRP with good scaring, peripheral patches with room for fill-in          IMAGING AND PROCEDURES  Imaging and Procedures for 07/27/17  OCT, Retina - OU - Both Eyes       Right Eye Quality was borderline. Central Foveal Thickness: 240. Progression has been stable. Findings include normal foveal contour, no IRF, no SRF, epiretinal membrane (Diffuse atrophy temporally).   Left Eye Quality was borderline. Central Foveal Thickness: 259. Progression has been stable. Findings include normal foveal contour, epiretinal membrane, no IRF, no SRF (Diffuse atrophy temporally ).   Notes *Images captured and stored on drive  Diagnosis / Impression:  No DME OU  Clinical management:  See below  Abbreviations: NFP - Normal foveal profile. CME - cystoid macular edema. PED - pigment epithelial detachment. IRF - intraretinal fluid. SRF - subretinal fluid. EZ - ellipsoid zone. ERM - epiretinal membrane. ORA - outer retinal atrophy. ORT - outer retinal tubulation. SRHM - subretinal hyper-reflective material         Panretinal Photocoagulation - OS - Left Eye       LASER PROCEDURE NOTE  Diagnosis:   Proliferative Diabetic Retinopathy, LEFT EYE  Procedure:  Pan-retinal photocoagulation using slit lamp laser, LEFT EYE, fill in  Anesthesia:  Topical  Surgeon: Rennis ChrisBrian Zamora, MD, PhD   Informed consent obtained, operative eye marked, and time out performed prior to initiation of laser.   Lumenis [id number] Laser Indirect Ophthalmoscope Power: 400 mW Duration: 150 msec    # spots: 747 spots  Complications: None.  Notes: significant cortical cataract preventing laser up take peripherally  RTC: 4-6 wks  Patient tolerated the procedure well and received written and verbal post-procedure care information/education.                 ASSESSMENT/PLAN:    ICD-10-CM   1. 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 Retinal edema H35.81 OCT, Retina - OU - Both Eyes  4. Essential hypertension I10   5. Hypertensive retinopathy of both eyes H35.033   6. Pseudophakia, right eye Z96.1   7. 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 Proliferative diabetic retinopathy w/o macular edema, both eyes The incidence, risk factors for progression, natural history and treatment options for diabetic retinopathy  were discussed with patient.  The need for close monitoring of blood glucose, blood pressure, and serum lipids, avoiding cigarette or any type of tobacco, and the need for long term follow up was also discussed with patient. - S/p PRP OD (01.17.19), fill-in (02.28.19) - S/p PRP OS (01.31.19) -  fibrosis overlying disc OD with fine NVD -- regressing - large preretinal hemorrhage inferiorly ?ruptured RAM? -- persistent but improving - OCT stable without diabetic macular edema, both eyes - recommend PRP fill-in OS today (04.04.19) - pt wishes to proceed with PRP laser fill-in OS today (04.04.19) - RBA of procedure discussed, questions answered - informed consent obtained and signed by POA - see procedure note - pt tolerated well - start PF QID OS x7 days - f/u in 4-6 wks - DFE/OCT/repeat FA (Optos, transit OD)   4,5. Hypertensive retinopathy OU - discussed importance of tight BP control - monitor  6.  Pseudophakia OD  - s/p CE/IOL OD -- pt unable to recall surgeon  - stable, doing well  - monitor  7. Nuclear and cortical cataract OS - The symptoms of cataract, surgical options, and treatments and risks were discussed with patient. - discussed diagnosis and progression - approaching visual significance - monitor for now   Ophthalmic Meds Ordered this visit:  No orders of the defined types were placed in this encounter.      Return in about 5 weeks (around 08/31/2017) for F/U PDR OU, Dilated exam, OCT, FA.  There are no Patient Instructions on file for this visit.   Explained the diagnoses, plan, and follow up with the patient and they expressed understanding.  Patient expressed understanding of the importance of proper follow up care.   This document serves as a record of services personally performed by Karie Chimera, MD, PhD. It was created on their behalf by Virgilio Belling, COA, a certified ophthalmic assistant. The creation of this record is the provider's dictation and/or activities during the visit.  Electronically signed by: Virgilio Belling, COA  07/27/17 8:51 AM   Karie Chimera, M.D., Ph.D. Diseases & Surgery of the Retina and Vitreous [facility] 07/27/17  I have reviewed the above documentation for accuracy and completeness, and I agree with the above. Karie Chimera, M.D., Ph.D. 07/27/17 8:51 AM     Abbreviations: M myopia (nearsighted); A astigmatism; H hyperopia (farsighted); P presbyopia; Mrx spectacle prescription;  CTL contact lenses; OD right eye; OS left eye; OU both eyes  XT exotropia; ET esotropia; PEK punctate epithelial keratitis; PEE punctate epithelial erosions; DES dry eye syndrome; MGD meibomian gland dysfunction; ATs artificial tears; PFAT's preservative free artificial tears; NSC nuclear sclerotic cataract; PSC posterior subcapsular cataract; ERM epi-retinal membrane; PVD posterior vitreous detachment; RD retinal  detachment; DM diabetes mellitus; DR diabetic retinopathy; NPDR non-proliferative diabetic retinopathy; PDR proliferative diabetic retinopathy; CSME clinically significant macular edema; DME diabetic macular edema; dbh dot blot hemorrhages; CWS cotton wool spot; POAG primary open angle glaucoma; C/D cup-to-disc ratio; HVF humphrey visual field; GVF goldmann visual field; OCT optical coherence tomography; IOP intraocular pressure; BRVO Branch retinal vein occlusion; CRVO central retinal vein occlusion; CRAO central retinal artery occlusion; BRAO branch retinal artery occlusion; RT retinal tear; SB scleral buckle; PPV pars plana vitrectomy; VH Vitreous hemorrhage; PRP panretinal laser photocoagulation; IVK intravitreal kenalog; VMT vitreomacular traction; MH Macular hole;  NVD neovascularization of the disc; NVE neovascularization elsewhere; AREDS age related eye disease study; ARMD age related macular degeneration; POAG primary open angle glaucoma; EBMD epithelial/anterior basement membrane dystrophy; ACIOL anterior chamber intraocular lens; IOL intraocular lens; PCIOL posterior chamber intraocular lens; Phaco/IOL phacoemulsification with intraocular lens placement; PRK photorefractive keratectomy; LASIK laser assisted in situ keratomileusis; HTN hypertension; DM diabetes mellitus; COPD chronic obstructive pulmonary disease

## 2017-07-26 ENCOUNTER — Emergency Department (HOSPITAL_COMMUNITY): Payer: Medicare Other

## 2017-07-26 ENCOUNTER — Emergency Department (HOSPITAL_COMMUNITY)
Admission: EM | Admit: 2017-07-26 | Discharge: 2017-07-27 | Disposition: A | Discharge status: Home / Self Care | Payer: Medicare Other | Attending: Emergency Medicine | Admitting: Emergency Medicine

## 2017-07-26 ENCOUNTER — Encounter (HOSPITAL_COMMUNITY): Payer: Self-pay | Admitting: *Deleted

## 2017-07-26 DIAGNOSIS — N186 End stage renal disease: Secondary | ICD-10-CM | POA: Insufficient documentation

## 2017-07-26 DIAGNOSIS — M25551 Pain in right hip: Secondary | ICD-10-CM | POA: Diagnosis not present

## 2017-07-26 DIAGNOSIS — Z7982 Long term (current) use of aspirin: Secondary | ICD-10-CM | POA: Insufficient documentation

## 2017-07-26 DIAGNOSIS — R319 Hematuria, unspecified: Secondary | ICD-10-CM | POA: Insufficient documentation

## 2017-07-26 DIAGNOSIS — Z79899 Other long term (current) drug therapy: Secondary | ICD-10-CM | POA: Diagnosis not present

## 2017-07-26 DIAGNOSIS — Z992 Dependence on renal dialysis: Secondary | ICD-10-CM | POA: Diagnosis not present

## 2017-07-26 DIAGNOSIS — E1122 Type 2 diabetes mellitus with diabetic chronic kidney disease: Secondary | ICD-10-CM | POA: Diagnosis not present

## 2017-07-26 DIAGNOSIS — R911 Solitary pulmonary nodule: Secondary | ICD-10-CM

## 2017-07-26 DIAGNOSIS — R109 Unspecified abdominal pain: Secondary | ICD-10-CM

## 2017-07-26 DIAGNOSIS — N390 Urinary tract infection, site not specified: Secondary | ICD-10-CM

## 2017-07-26 DIAGNOSIS — R1031 Right lower quadrant pain: Secondary | ICD-10-CM | POA: Diagnosis present

## 2017-07-26 DIAGNOSIS — Z794 Long term (current) use of insulin: Secondary | ICD-10-CM | POA: Insufficient documentation

## 2017-07-26 LAB — COMPREHENSIVE METABOLIC PANEL WITH GFR
ALT: 19 U/L (ref 14–54)
AST: 26 U/L (ref 15–41)
Albumin: 3.8 g/dL (ref 3.5–5.0)
Alkaline Phosphatase: 172 U/L — ABNORMAL HIGH (ref 38–126)
Anion gap: 16 — ABNORMAL HIGH (ref 5–15)
BUN: 18 mg/dL (ref 6–20)
CO2: 30 mmol/L (ref 22–32)
Calcium: 9.7 mg/dL (ref 8.9–10.3)
Chloride: 93 mmol/L — ABNORMAL LOW (ref 101–111)
Creatinine, Ser: 3.42 mg/dL — ABNORMAL HIGH (ref 0.44–1.00)
GFR calc Af Amer: 16 mL/min — ABNORMAL LOW (ref >60)
GFR calc non Af Amer: 14 mL/min — ABNORMAL LOW (ref >60)
Glucose, Bld: 163 mg/dL — ABNORMAL HIGH (ref 65–99)
Potassium: 3.4 mmol/L — ABNORMAL LOW (ref 3.5–5.1)
Sodium: 139 mmol/L (ref 135–145)
Total Bilirubin: 0.6 mg/dL (ref 0.3–1.2)
Total Protein: 7.9 g/dL (ref 6.5–8.1)

## 2017-07-26 LAB — URINALYSIS, ROUTINE W REFLEX MICROSCOPIC
Bilirubin Urine: NEGATIVE
Glucose, UA: 150 mg/dL — AB
Ketones, ur: NEGATIVE mg/dL
Nitrite: NEGATIVE
Protein, ur: 100 mg/dL — AB
Specific Gravity, Urine: 1.015 (ref 1.005–1.030)
Squamous Epithelial / HPF: NONE SEEN
pH: 8 (ref 5.0–8.0)

## 2017-07-26 LAB — CBC
HCT: 42.3 % (ref 36.0–46.0)
Hemoglobin: 13.7 g/dL (ref 12.0–15.0)
MCH: 30.4 pg (ref 26.0–34.0)
MCHC: 32.4 g/dL (ref 30.0–36.0)
MCV: 93.8 fL (ref 78.0–100.0)
Platelets: 121 K/uL — ABNORMAL LOW (ref 150–400)
RBC: 4.51 MIL/uL (ref 3.87–5.11)
RDW: 14.1 % (ref 11.5–15.5)
WBC: 4.9 K/uL (ref 4.0–10.5)

## 2017-07-26 MED ORDER — ACETAMINOPHEN 325 MG PO TABS
650.0000 mg | ORAL_TABLET | Freq: Once | ORAL | Status: AC
  Administered 2017-07-27: 650 mg via ORAL
  Filled 2017-07-26: qty 2

## 2017-07-26 MED ORDER — IOPAMIDOL (ISOVUE-300) INJECTION 61%: 30.0000 mL | Freq: Once | INTRAVENOUS | Status: DC | PRN

## 2017-07-26 MED ORDER — IOPAMIDOL (ISOVUE-300) INJECTION 61%
INTRAVENOUS | Status: AC
  Filled 2017-07-26: qty 30

## 2017-07-26 MED ORDER — CEPHALEXIN 500 MG PO CAPS: 500.0000 mg | ORAL_CAPSULE | Freq: Two times a day (BID) | ORAL | 0 refills | Status: AC

## 2017-07-26 NOTE — ED Notes (Signed)
Bed: [facility] Expected date:  Expected time:  Means of arrival:  Comments: EMS-hip pain 

## 2017-07-26 NOTE — Discharge Instructions (Addendum)
You were given a prescription for antibiotics. Please take the antibiotic prescription fully. Stop the antibiotic if you experience an adverse side effects that you cannot tolerate.  Please follow up with your primary doctor within the next 5-7 days.  If you do not have a primary care provider, information for a healthcare clinic has been provided for you to make arrangements for follow up care. Please return to the ER sooner if you have any new or worsening symptoms, or if you have any of the following symptoms:  Abdominal pain that does not go away.  You have a fever.  You keep throwing up (vomiting).  The pain is felt only in portions of the abdomen. Pain in the right side could possibly be appendicitis. In an adult, pain in the left lower portion of the abdomen could be colitis or diverticulitis.  You pass bloody or black tarry stools.  There is bright red blood in the stool.  The constipation stays for more than 4 days.  There is belly (abdominal) or rectal pain.  You do not seem to be getting better.  You have any questions or concerns.

## 2017-07-26 NOTE — ED Triage Notes (Signed)
Per EMS pt coming from dialysis center with c/o right hip pain which started after a fall approx a month ago. Per EMS pt also reports generalized abdominal pain. [facility], has a hx of DM, BLE amputations (Left bellow knee, right above knee), left hand amputation, right hand contracture. Pt received a full dialysis treatment prior to arrival today

## 2017-07-26 NOTE — ED Provider Notes (Signed)
[facility] DEPT Provider Note   CSN: [id number] Arrival date & time: 07/26/17  1710     History   Chief Complaint Chief Complaint  Patient presents with  . Hip Pain  . Abdominal Pain    HPI [name] is a 57 y.o. female.  HPI   Patient is a 57 year old female with a history of end-stage renal disease on dialysis MWF, DM, HTN, mult extremity amputations, who presents the ED today complaining of right hip pain and right lower quadrant abdominal pain that has been present for the last several days.  States pain to right hip located anteriorly and posteriorly.  Pain is 10/10 and worse with movement.  Denies any swelling or redness to the right hip.  States she has good range of motion.  States right lower quadrant abdominal pain is constant 9/10 pain.  States pain radiates to back and has been present as well for about 3 days.  Was given pain medications at her facility, which did not improve her pain.  She is unsure which medication she received.  Patient states she still makes urine, however not daily.  Has had some dysuria for the last 3 days.  She has chronic intermittent hematuria as well.  States she has a distant history of nephrolithiasis.  Patient states that all of her pain began after she fell out of bed several days ago.  States she fell on to her right side.  Did not hit her head.  Did not lose consciousness.  Did not have any prodrome to the fall such as chest pain, shortness of breath, lightheadedness.  States fall was mechanical and she rolled off the bed by accident.  Denies any current chest pain, shortness of breath, headaches.  Denies any nausea, vomiting, diarrhea, blood in stool. No numbness to BUE or BLE.   Patient was at dialysis center prior to arrival and completed dialysis fully today. Not on blood thinners.   Past Medical History:  Diagnosis Date  . Anemia   . Blood transfusion   . Chronic back pain   . Dementia   . Diabetes  mellitus   . Hemodialysis patient ([place]) 03/15/11   "Tues; Thurs; Sat; [facility]"  . Hypertension   . PEA (Pulseless electrical activity) ([place])   . Peripheral vascular disease ([place])   . Rash 03/15/11   "admitted me to find out what its' from; UE, waist, thighs, groin"  . Renal failure    hd 4/12  . Stroke [facility])     Patient Active Problem List   Diagnosis Date Noted  . Hypothermia   . Abnormal EKG   . Elevated troponin   . Hypoglycemia due to type 2 diabetes mellitus ([place]) 06/11/2015  . Pulmonary edema 06/11/2015  . S/P AKA (above knee amputation) unilateral ([place]) 05/19/2015  . 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 Depression 03/13/2015  . Prolonged Q-T interval on ECG 03/13/2015  . Type 2 diabetes mellitus, controlled, with renal complications ([place]) 90/30/0923  . Unspecified local infection of skin and subcutaneous tissue   . Chronic anemia 12/10/2014  . Ulcer of lower limb, unspecified 01/22/2013  . Mechanical complication of other vascular device, implant, and graft 05/01/2012  . Mental status, decreased 03/19/2012  . Seizure-like activity ([place]) 03/19/2012  . Thrombocytopenia ([place]) 01/24/2012  . Acute respiratory failure with hypoxia ([place]) 01/23/2012  . HTN (hypertension) 03/01/2011  . ESRD (end stage renal disease) on dialysis ([place]) 03/01/2011  .  Obesities, morbid ([place]) 03/01/2011  . Osteomyelitis ([place]) 03/01/2011  . 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 Complication of vascular access for dialysis 03/01/2011    Past Surgical History:  Procedure Laterality Date  . AMPUTATION  03/04/2011   Procedure: AMPUTATION DIGIT;  Surgeon: Newt Minion, MD;  Location: [place];  Service: Orthopedics;  Laterality: Right;  RT GREAT TOE AMP  . AMPUTATION Right 03/13/2015   Procedure: RIGHT  ABOVE KNEE AMPUTATION;  Surgeon: Mal Misty, MD;  Location: [facility];  Service: Vascular;  Laterality: Right;  .  ARTERIOVENOUS GRAFT PLACEMENT  10/01/10   left upper arm  . BELOW KNEE LEG AMPUTATION  2004   left  . CARPAL TUNNEL RELEASE  ~ 2009   left wrist  . CATARACT EXTRACTION W/ INTRAOCULAR LENS IMPLANT  12/2009   right eye  . HAND AMPUTATION Left   . INSERTION OF DIALYSIS CATHETER  07/29/10   right subclavian  . IR FLUORO RM 30-60 MIN  08/16/2016  . IR GASTROSTOMY TUBE MOD SED  08/23/2016  . IR GASTROSTOMY TUBE REMOVAL  02/07/2017  . TEE WITHOUT CARDIOVERSION  03/18/2011   Procedure: TRANSESOPHAGEAL ECHOCARDIOGRAM (TEE);  Surgeon: Loralie Champagne, MD;  Location: [place];  Service: Cardiovascular;  Laterality: N/A;     OB History   None      Home Medications    Prior to Admission medications   Medication Sig Start Date End Date Taking? Authorizing Provider  aspirin 81 MG chewable tablet Place 1 tablet (81 mg total) into feeding tube daily. Patient taking differently: Chew 81 mg by mouth daily.  08/28/16  Yes Thurnell Lose, MD  atorvastatin (LIPITOR) 10 MG tablet Take 10 mg by mouth daily.   Yes [provider]  Cholecalciferol (VITAMIN D-3) 5000 units TABS Take 5,000 Units by mouth daily.   Yes [provider]  cinacalcet (SENSIPAR) 30 MG tablet Take 30 mg by mouth at bedtime.   Yes [provider]  docusate sodium (COLACE) 50 MG capsule Take 50 mg by mouth every evening.   Yes [provider]  gabapentin (NEURONTIN) 100 MG capsule Take 100 mg by mouth at bedtime.   Yes [provider]  glucagon (GLUCAGON EMERGENCY) 1 MG injection Inject 1 mg into the vein once as needed (for blood sugar).   Yes [provider]  glycerin adult 2 g suppository Place 1 suppository rectally as needed for constipation.   Yes [provider]  guaiFENesin (ROBITUSSIN) 100 MG/5ML SOLN Take 20 mLs by mouth every 8 (eight) hours as needed for cough or to loosen phlegm.   Yes [provider]  HYDROcodone-acetaminophen (NORCO/VICODIN) 5-325 MG  tablet Take 1-2 tablets by mouth every 6 (six) hours as needed. 01/12/17  Yes Montine Circle, PA-C  hydrocortisone cream 1 % Apply 1 application topically every 8 (eight) hours as needed for itching.   Yes [provider]  Infant Care Products (DERMACLOUD) CREA Apply topically 3 (three) times daily. To buttocks   Yes [provider]  insulin aspart (NOVOLOG) 100 UNIT/ML injection Inject 2-10 Units into the skin 3 (three) times daily before meals. Per sliding scale 0-59: notify doctor 60-100= 0 units 101-199 = 2 units 200-299 = 6 units 300-349 = 8 units 350-399 = 10 units 400-100 = Notify Doctor   Yes [provider]  lamoTRIgine (LAMICTAL) 25 MG tablet Take 25 mg by mouth at bedtime.   Yes [provider]  levothyroxine (SYNTHROID, LEVOTHROID) 25 MCG tablet  Place 1 tablet (25 mcg total) into feeding tube daily before breakfast. Patient taking differently: Take 25 mcg by mouth daily before breakfast.  08/28/16  Yes Thurnell Lose, MD  Melatonin-Pyridoxine 3-1 MG TABS Take 1 tablet by mouth at bedtime.   Yes [provider]  mirtazapine (REMERON) 30 MG tablet Take 30 mg by mouth at bedtime.   Yes [provider]  ondansetron (ZOFRAN) 4 MG tablet Take 4 mg by mouth every 8 (eight) hours as needed for nausea or vomiting.   Yes [provider]  pantoprazole (PROTONIX) 40 MG tablet Take 40 mg by mouth every evening.    Yes [provider]  polyethylene glycol (MIRALAX) packet Take 17 g by mouth daily. 05/04/17  Yes Palumbo, April, MD  Probiotic Product (PROBIOTIC PO) Take 250 mg by mouth 2 (two) times daily.   Yes [provider]  promethazine (PHENERGAN) 6.25 MG/5ML syrup Take 12.5 mg by mouth every 6 (six) hours as needed for nausea or vomiting.   Yes [provider]  Propylene Glycol-Glycerin (ARTIFICIAL TEARS) 1-0.3 % SOLN Place 1 drop into both eyes every 12 (twelve) hours as needed (for eye iritation).    Yes [provider]  senna-docusate (SENOKOT-S) 8.6-50 MG tablet Take 2 tablets by mouth at bedtime.    Yes [provider]  sevelamer carbonate (RENVELA) 800 MG tablet Take 400 mg by mouth 3 (three) times daily with meals.   Yes [provider]  sucralfate (CARAFATE) 1 g tablet Take 1 g by mouth 4 (four) times daily.   Yes [provider]  traMADol (ULTRAM) 50 MG tablet Take 1 tablet (50 mg total) by mouth every 12 (twelve) hours as needed for moderate pain. Patient taking differently: Take 50 mg by mouth every 8 (eight) hours as needed for moderate pain.  06/15/15  Yes Verlee Monte, MD  triamcinolone (KENALOG) 0.025 % cream Apply 1 application topically 2 (two) times daily.   Yes [provider]  Verlee Monte (PREPARATION H TOTABLES WIPES) 50 % PADS Apply topically every 8 (eight) hours as needed (for hemmorrhoids).   Yes [provider]  cephALEXin (KEFLEX) 500 MG capsule Take 1 capsule (500 mg total) by mouth 2 (two) times daily for 7 days. 07/26/17 08/02/17  Vikas Wegmann S, PA-C  neomycin-bacitracin-polymyxin (NEOSPORIN) ointment Apply 1 application topically daily.    [provider]  nitrofurantoin, macrocrystal-monohydrate, (MACROBID) 100 MG capsule Take 1 capsule (100 mg total) by mouth 2 (two) times daily. X 7 days 05/04/17   Palumbo, April, MD  Nutritional Supplements (FEEDING SUPPLEMENT, NEPRO CARB STEADY,) LIQD Place 360 mLs into feeding tube 3 (three) times daily with meals. 08/27/16   Thurnell Lose, MD  prednisoLONE acetate (PRED FORTE) 1 % ophthalmic suspension Place 1 drop into the right eye 4 (four) times daily. Patient not taking: Reported on 06/22/2017 05/11/17   Bernarda Caffey, MD  predniSONE (DELTASONE) 50 MG tablet Take 1 tablet daily x 5 days. 01/19/17   Suzan Slick, NP  Water For Irrigation, Sterile (FREE WATER) SOLN Place 100 mLs into feeding tube every 8 (eight) hours. 08/27/16   Thurnell Lose, MD    Family  History Family History  Problem Relation Age of Onset  . Diabetes Mellitus II Mother   . Stroke Mother     Social History Social History   Tobacco Use  . Smoking status: Never Smoker  . Smokeless tobacco: Never Used  Substance Use Topics  . Alcohol use:  No  . Drug use: No     Allergies   Codeine; Levofloxacin; and Penicillins   Review of Systems Review of Systems  Constitutional: Negative for fever.  HENT: Negative for congestion.   Respiratory: Negative for shortness of breath.   Cardiovascular: Negative for chest pain and leg swelling.  Gastrointestinal: Positive for abdominal pain. Negative for blood in stool, constipation, diarrhea, nausea and vomiting.  Genitourinary: Positive for dysuria, flank pain and hematuria.  Musculoskeletal: Negative for back pain and neck pain.  Skin: Negative for color change and wound.  Neurological: Negative for dizziness, weakness, light-headedness, numbness and headaches.       No head trauma or loc     Physical Exam Updated Vital Signs BP 138/78 (BP Location: Right Arm)   Pulse 92   Temp 98.8 F (37.1 C) (Oral)   Resp 18   SpO2 96%   Physical Exam  Constitutional: She appears well-developed and well-nourished. No distress.  Chronically ill appearing  HENT:  Head: Normocephalic and atraumatic.  Mouth/Throat: Oropharynx is clear and moist.  Eyes: Conjunctivae are normal.  Neck: Neck supple.  Cardiovascular: Normal rate, regular rhythm and normal heart sounds.  No murmur heard. Radial pulse intact to RUE  Pulmonary/Chest: Effort normal and breath sounds normal. No respiratory distress. She has no wheezes.  Abdominal: Soft. Bowel sounds are normal.  RUQ and RLQ ttp, right CVA ttp  Musculoskeletal: She exhibits no edema.  Left BKA, right AKA, amputation at left forearm, right hand contracture (chronic), all extremities are warm and well perfused with brisk cap refill, No TTP to the cervical, thoracic, or lumbar spine. No  pain to the paraspinous muscles. ttp to posterior aspect of right hip, no erythema, swelling, or warmth. Good rom to BLE. 5/5 strength with flexion/extension/abduction/adduction. Sensation intact and symmetric to distal LE  Neurological: She is alert.  Skin: Skin is warm and dry. Capillary refill takes less than 2 seconds.  No evidence of skin breakdown to right buttock area  Psychiatric: She has a normal mood and affect.  Nursing note and vitals reviewed.    ED Treatments / Results  Labs (all labs ordered are listed, but only abnormal results are displayed) Labs Reviewed  COMPREHENSIVE METABOLIC PANEL - Abnormal; Notable for the following components:      Result Value   Potassium 3.4 (*)    Chloride 93 (*)    Glucose, Bld 163 (*)    Creatinine, Ser 3.42 (*)    Alkaline Phosphatase 172 (*)    GFR calc non Af Amer 14 (*)    GFR calc Af Amer 16 (*)    Anion gap 16 (*)    All other components within normal limits  CBC - Abnormal; Notable for the following components:   Platelets 121 (*)    All other components within normal limits  URINALYSIS, ROUTINE W REFLEX MICROSCOPIC - Abnormal; Notable for the following components:   APPearance TURBID (*)    Glucose, UA 150 (*)    Hgb urine dipstick SMALL (*)    Protein, ur 100 (*)    Leukocytes, UA MODERATE (*)    Bacteria, UA MANY (*)    All other components within normal limits  URINE CULTURE    EKG None  Radiology Ct Abdomen Pelvis Wo Contrast  Result Date: 07/26/2017 CLINICAL DATA:  Generalized abdominal pain and RIGHT hip pain after fall 1 month ago. History of dementia, contractures, end-stage renal disease on dialysis. EXAM: CT ABDOMEN AND PELVIS WITHOUT CONTRAST  TECHNIQUE: Multidetector CT imaging of the abdomen and pelvis was performed following the standard protocol without IV contrast. COMPARISON:  CT abdomen and pelvis June 20, 2016 FINDINGS: LOWER CHEST: 5 mm RIGHT lower lobe stable pulmonary nodule. Stable 3 mm  subpleural RIGHT lower lobe subpleural pulmonary nodule. 3 mm ground-glass pulmonary nodule RIGHT lower lobe RIGHT lower lobe pulmonary nodule. Punctate calcified granuloma RIGHT middle lobe. Heart size is normal. Severe coronary artery calcifications. No pericardial effusion. HEPATOBILIARY: Normal. PANCREAS: Normal. SPLEEN: Normal. ADRENALS/URINARY TRACT: Kidneys are orthotopic, symmetrically severely atrophic. Advanced bilateral vascular calcifications. No nephrolithiasis, hydronephrosis; limited assessment for renal masses on this nonenhanced examination. The unopacified ureters are normal in course and caliber. Urinary bladder is decompressed and unremarkable. Normal adrenal glands. STOMACH/BOWEL: The stomach, small and large bowel are normal in course and caliber without inflammatory changes, enteric contrast has not yet reached the distal small bowel. Mild colonic diverticulosis. The stable densities within the appendix which is otherwise unremarkable. VASCULAR/LYMPHATIC: Aortoiliac vessels are normal in course and caliber. Severe calcific atherosclerosis. No lymphadenopathy by CT size criteria. REPRODUCTIVE: Normal. OTHER: Small volume perihepatic free fluid. MUSCULOSKELETAL: Non-acute. Symmetrically atrophic gluteal muscles. Anterior abdominal wall scarring. Osteopenia. Mild old L4 compression fracture with large superior endplate Schmorl's node. Additional smaller scattered Schmorl's nodes. Severe L5-S1 degenerative disc. Moderate to severe L5-S1 neural foraminal narrowing. IMPRESSION: 1. No acute intra-abdominal or pelvic process. 2. Similar small volume perihepatic ascites. 3. Similar atrophic kidneys without obstructive uropathy. 4. **An incidental finding of potential clinical significance has been found. No follow-up needed if patient is low-risk (and has no known or suspected primary neoplasm). Non-contrast chest CT can be considered in 12 months if patient is high-risk. This recommendation follows  the consensus statement: Guidelines for Management of Incidental Pulmonary Nodules Detected on CT Images: From the Fleischner Society 2017; Radiology 2017; [id number].** 5.  Aortic Atherosclerosis (ICD10-I70.0). Electronically Signed   By: Elon Alas M.D.   On: 07/26/2017 20:40   Dg Ribs Unilateral W/chest Right  Result Date: 07/26/2017 CLINICAL DATA:  RIGHT hip pain after fall. Abdominal pain. History of end-stage renal disease on dialysis, status post full treatment today. EXAM: RIGHT RIBS AND CHEST - 3+ VIEW COMPARISON:  Chest radiograph May 29, 2017 FINDINGS: Cardiac silhouette is mildly enlarged and unchanged. Calcified aortic knob. Mild pulmonary vascular congestion without pleural effusion or focal consolidation. No pneumothorax. And soft tissue planes and included osseous structures are nonsuspicious. IMPRESSION: Stable cardiomegaly.  Pulmonary vascular congestion. Aortic Atherosclerosis (ICD10-I70.0). Electronically Signed   By: Elon Alas M.D.   On: 07/26/2017 19:28   Dg Hip Unilat  With Pelvis 2-3 Views Right  Result Date: 07/26/2017 CLINICAL DATA:  Right hip pain after a fall EXAM: DG HIP (WITH OR WITHOUT PELVIS) 2-3V RIGHT COMPARISON:  CT 06/20/2017 FINDINGS: Extensive pelvic vascular calcifications. SI joints are non widened. Osteopenia limits the exam. The pubic symphysis appears intact. Both femoral heads project in joint. Questionable fracture deformity of the right inferior pubic ramus. Skin fold artifact versus fracture lucency over the lower trochanter of the right femur. IMPRESSION: 1. Limited study secondary to osteopenia. 2. Questionable right inferior pubic ramus fracture. Questionable linear fracture lucency over the trochanter of the right femur. Suggest CT to further evaluate. Electronically Signed   By: Donavan Foil M.D.   On: 07/26/2017 19:30    Procedures Procedures (including critical care time)  Medications Ordered in ED Medications  iopamidol  (ISOVUE-300) 61 % injection (has no administration in time range)  iopamidol (ISOVUE-300)  61 % injection 30 mL (has no administration in time range)  acetaminophen (TYLENOL) tablet 650 mg (650 mg Oral Given 07/27/17 0015)     Initial Impression / Assessment and Plan / ED Course  I have reviewed the triage vital signs and the nursing notes.  Pertinent labs & imaging results that were available during my care of the patient were reviewed by me and considered in my medical decision making (see chart for details).  Discussed pt presentation and exam findings with Dr. Zenia Resides, who advised to give the patient Keflex for UTI.    9:35 PM discussed CT scan with Dr. Burna Forts from radiology who does not observe intertrochanteric fracture.  States that questionable potential inferior pubic rami fracture noted on x-ray has also been noted on prior imaging studies and is likely old.  10:20 PM Spoke with pt's legal guardian, Verlon Setting, who is comfortable with the plan for discharge.    Final Clinical Impressions(s) / ED Diagnoses   Final diagnoses:  Urinary tract infection with hematuria, site unspecified  Abdominal pain, unspecified abdominal location  Right hip pain  Lung nodule   Patient presenting with right hip pain, right posterior rib pain, and right lower quadrant abdominal pain after a mechanical fall.  Vital signs are stable.  Patient in no acute distress and is afebrile.  Patient has good range of motion on exam, no erythema or warmth to suggest septic joint.  Patient has tenderness to right posterior hip along sciatic notch reproduces pain.  Chest x-ray without evidence of rib fractures.  Did so some evidence of pulmonary vascular congestion however patient has no respiratory complaints and is satting at 96% on room air.  She is asymptomatic from this and I doubt acute etiology that would require emergent intervention.  Right hip x-ray with questionable right inferior pubic ramus fracture and  questionable lucency over trochanter of right femur.  CT abdomen pelvis noted abnormality on inferior pubic ramus fracture however radiologist noted this on prior CT scan and states that this is old.  No evidence of intertrochanteric fracture.  No other acute intra-abdominal or pelvic process was noted on CT scan.  CMP at baseline with mild electrolyte abnormalities and creatinine at 3.42.  Also with elevated alk phos, however improved from prior.  CBC with low platelet count to 121 however improved from prior.  UA with leukocytes suggesting UTI.  Will treat patient with Keflex and have patient follow-up as an outpatient.  Advised to return for any new or worsening symptoms.  Legal guardian notified.  All questions answered and patient understands the plan.  ED Discharge Orders        Ordered    cephALEXin (KEFLEX) 500 MG capsule  2 times daily     07/26/17 2235       Rodney Booze, PA-C 07/27/17 5102    Lacretia Leigh, MD 07/31/17 1551

## 2017-07-26 NOTE — ED Notes (Signed)
Urine culture was not collected when the pt was cathed last shift

## 2017-07-27 ENCOUNTER — Ambulatory Visit (INDEPENDENT_AMBULATORY_CARE_PROVIDER_SITE_OTHER): Payer: Medicare Other | Admitting: Ophthalmology

## 2017-07-27 ENCOUNTER — Encounter (INDEPENDENT_AMBULATORY_CARE_PROVIDER_SITE_OTHER): Payer: Self-pay | Admitting: Ophthalmology

## 2017-07-27 DIAGNOSIS — E113593 Type 2 diabetes mellitus with proliferative diabetic retinopathy without macular edema, bilateral: Secondary | ICD-10-CM | POA: Diagnosis not present

## 2017-07-27 DIAGNOSIS — E11319 Type 2 diabetes mellitus with unspecified diabetic retinopathy without macular edema: Secondary | ICD-10-CM | POA: Diagnosis not present

## 2017-07-27 DIAGNOSIS — H3581 Retinal edema: Secondary | ICD-10-CM

## 2017-07-27 DIAGNOSIS — H35033 Hypertensive retinopathy, bilateral: Secondary | ICD-10-CM | POA: Diagnosis not present

## 2017-07-27 DIAGNOSIS — H35049 Retinal micro-aneurysms, unspecified, unspecified eye: Secondary | ICD-10-CM

## 2017-07-27 DIAGNOSIS — H25812 Combined forms of age-related cataract, left eye: Secondary | ICD-10-CM

## 2017-07-27 DIAGNOSIS — Z961 Presence of intraocular lens: Secondary | ICD-10-CM

## 2017-07-27 DIAGNOSIS — I10 Essential (primary) hypertension: Secondary | ICD-10-CM | POA: Diagnosis not present

## 2017-07-27 NOTE — ED Notes (Addendum)
[facility]

## 2017-07-28 LAB — URINE CULTURE: Culture: NO GROWTH

## 2017-08-29 ENCOUNTER — Non-Acute Institutional Stay: Payer: Medicare Other | Admitting: Hospice and Palliative Medicine

## 2017-08-30 ENCOUNTER — Non-Acute Institutional Stay: Payer: Medicare Other | Admitting: Hospice and Palliative Medicine

## 2017-09-05 ENCOUNTER — Non-Acute Institutional Stay: Payer: Self-pay | Admitting: Hospice and Palliative Medicine

## 2017-09-05 DIAGNOSIS — Z515 Encounter for palliative care: Secondary | ICD-10-CM

## 2017-09-05 NOTE — Progress Notes (Signed)
Routine follow up visit attempted but patient was at dialysis. Staff report she has been doing well without significant recent changes.

## 2017-09-06 ENCOUNTER — Non-Acute Institutional Stay: Payer: Medicare Other | Admitting: Hospice and Palliative Medicine

## 2017-09-06 NOTE — Progress Notes (Addendum)
[facility] - Clinic Note  09/07/2017     CHIEF COMPLAINT Patient presents for Retina Follow Up   HISTORY OF PRESENT ILLNESS: [name] is a 57 y.o. female who presents to the clinic today for:   HPI    Retina Follow Up    Patient presents with  Other.  Severity is moderate.  Duration of 1 month.  Since onset it is stable.  I, the attending physician,  performed the HPI with the patient and updated documentation appropriately.          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 (History)       Referring physician: Terrial Rhodes, MD [address] [place], [place] [address]  HISTORICAL INFORMATION:   Selected notes from the MEDICAL RECORD NUMBER Referred by Dr. Hansel Feinstein (Nephrologist) for DM exam;  LEE-  Ocular Hx-  PMH- DM    CURRENT MEDICATIONS: Current Outpatient Medications (Ophthalmic Drugs)  Medication Sig  . prednisoLONE acetate (PRED FORTE) 1 % ophthalmic suspension Place 1 drop into the right eye 4 (four) times daily.  Marland Kitchen Propylene Glycol-Glycerin (ARTIFICIAL TEARS) 1-0.3 % SOLN Place 1 drop into both eyes every 12 (twelve) hours as needed (for eye iritation).   No current facility-administered medications for this visit.  (Ophthalmic Drugs)   Current Outpatient Medications (Other)  Medication Sig  . aspirin 81 MG chewable tablet Place 1 tablet (81 mg total) into feeding tube daily. (Patient taking differently: Chew 81 mg by mouth daily. )  . atorvastatin (LIPITOR) 10 MG tablet Take 10 mg by mouth daily.  . Cholecalciferol (VITAMIN D-3) 5000 units TABS Take 5,000 Units by mouth daily.  . cinacalcet (SENSIPAR) 30 MG tablet  Take 30 mg by mouth at bedtime.  . docusate sodium (COLACE) 50 MG capsule Take 50 mg by mouth every evening.  . gabapentin (NEURONTIN) 100 MG capsule Take 100 mg by mouth at bedtime.  Marland Kitchen glucagon (GLUCAGON EMERGENCY) 1 MG injection Inject 1 mg into the vein once as needed (for blood sugar).  Marland Kitchen glycerin adult 2 g suppository Place 1 suppository rectally as needed for constipation.  Marland Kitchen guaiFENesin (ROBITUSSIN) 100 MG/5ML SOLN Take 20 mLs by mouth every 8 (eight) hours as needed for cough or to loosen phlegm.  Marland Kitchen HYDROcodone-acetaminophen (NORCO/VICODIN) 5-325 MG tablet Take 1-2 tablets by mouth every 6 (six) hours as needed.  . hydrocortisone cream 1 % Apply 1 application topically every 8 (eight) hours as needed for itching.  . Infant Care Products (DERMACLOUD) CREA Apply topically 3 (three) times daily. To buttocks  . insulin aspart (NOVOLOG) 100 UNIT/ML injection Inject 2-10 Units into the skin 3 (three) times daily before meals. Per sliding scale 0-59: notify doctor 60-100= 0 units 101-199 = 2 units 200-299 = 6 units 300-349 = 8 units 350-399 = 10 units 400-100 = Notify Doctor  . lamoTRIgine (LAMICTAL) 25 MG tablet Take 25 mg by mouth at bedtime.  Marland Kitchen levothyroxine (SYNTHROID, LEVOTHROID) 25 MCG tablet Place 1 tablet (25 mcg total) into feeding tube daily before breakfast. (Patient taking differently: Take 25 mcg by mouth daily before breakfast. )  .  Melatonin-Pyridoxine 3-1 MG TABS Take 1 tablet by mouth at bedtime.  . mirtazapine (REMERON) 30 MG tablet Take 30 mg by mouth at bedtime.  Marland Kitchen neomycin-bacitracin-polymyxin (NEOSPORIN) ointment Apply 1 application topically daily.  . nitrofurantoin, macrocrystal-monohydrate, (MACROBID) 100 MG capsule Take 1 capsule (100 mg total) by mouth 2 (two) times daily. X 7 days  . Nutritional Supplements (FEEDING SUPPLEMENT, NEPRO CARB STEADY,) LIQD Place 360 mLs into feeding tube 3 (three) times daily with meals.  . ondansetron (ZOFRAN) 4 MG tablet Take 4 mg  by mouth every 8 (eight) hours as needed for nausea or vomiting.  . pantoprazole (PROTONIX) 40 MG tablet Take 40 mg by mouth every evening.   . polyethylene glycol (MIRALAX) packet Take 17 g by mouth daily.  . predniSONE (DELTASONE) 50 MG tablet Take 1 tablet daily x 5 days.  . Probiotic Product (PROBIOTIC PO) Take 250 mg by mouth 2 (two) times daily.  . promethazine (PHENERGAN) 6.25 MG/5ML syrup Take 12.5 mg by mouth every 6 (six) hours as needed for nausea or vomiting.  . senna-docusate (SENOKOT-S) 8.6-50 MG tablet Take 2 tablets by mouth at bedtime.   . sevelamer carbonate (RENVELA) 800 MG tablet Take 400 mg by mouth 3 (three) times daily with meals.  . sucralfate (CARAFATE) 1 g tablet Take 1 g by mouth 4 (four) times daily.  . traMADol (ULTRAM) 50 MG tablet Take 1 tablet (50 mg total) by mouth every 12 (twelve) hours as needed for moderate pain. (Patient taking differently: Take 50 mg by mouth every 8 (eight) hours as needed for moderate pain. )  . triamcinolone (KENALOG) 0.025 % cream Apply 1 application topically 2 (two) times daily.  . Water For Irrigation, Sterile (FREE WATER) SOLN Place 100 mLs into feeding tube every 8 (eight) hours.  Weyman Croon Hazel (PREPARATION H TOTABLES WIPES) 50 % PADS Apply topically every 8 (eight) hours as needed (for hemmorrhoids).   No current facility-administered medications for this visit.  (Other)      REVIEW OF SYSTEMS: ROS    Positive for: Endocrine, Cardiovascular, Eyes, Allergic/Imm   Negative for: Constitutional, Gastrointestinal, Neurological, Skin, Genitourinary, Musculoskeletal, HENT, Respiratory, Psychiatric, Heme/Lymph   Last edited by Posey Boyer, COT on 09/07/2017  8:13 AM. (History)       ALLERGIES Allergies  Allergen Reactions  . Codeine Hives  . Levofloxacin Other (See Comments)    Unknown, but noted on MAR  . Penicillins Hives    Has patient had a PCN reaction causing immediate rash, facial/tongue/throat swelling, SOB or  lightheadedness with hypotension: Yes Has patient had a PCN reaction causing severe rash involving mucus membranes or skin necrosis: Unk Has patient had a PCN reaction that required hospitalization: Unk Has patient had a PCN reaction occurring within the last 10 years: Unk If all of the above answers are "NO", then may proceed with Cephalosporin use.     PAST MEDICAL HISTORY Past Medical History:  Diagnosis Date  . Anemia   . Blood transfusion   . Chronic back pain   . Dementia   . Diabetes mellitus   . Hemodialysis patient (HCC) 03/15/11   "Tues; Thurs; Sat; [facility]"  . Hypertension   . PEA (Pulseless electrical activity) (HCC)   . Peripheral vascular disease (HCC)   . Rash 03/15/11   "admitted me to find out what its' from; UE, waist, thighs, groin"  . Renal failure    hd 4/12  . Stroke [facility])    Past Surgical  History:  Procedure Laterality Date  . AMPUTATION  03/04/2011   Procedure: AMPUTATION DIGIT;  Surgeon: [name], MD;  Location: [facility] OR;  Service: Orthopedics;  Laterality: Right;  RT GREAT TOE AMP  . AMPUTATION Right 03/13/2015   Procedure: RIGHT  ABOVE KNEE AMPUTATION;  Surgeon: Pryor Ochoa, MD;  Location: [facility] OR;  Service: Vascular;  Laterality: Right;  . ARTERIOVENOUS GRAFT PLACEMENT  10/01/10   left upper arm  . BELOW KNEE LEG AMPUTATION  2004   left  . CARPAL TUNNEL RELEASE  ~ 2009   left wrist  . CATARACT EXTRACTION W/ INTRAOCULAR LENS IMPLANT  12/2009   right eye  . HAND AMPUTATION Left   . INSERTION OF DIALYSIS CATHETER  07/29/10   right subclavian  . IR FLUORO RM 30-60 MIN  08/16/2016  . IR GASTROSTOMY TUBE MOD SED  08/23/2016  . IR GASTROSTOMY TUBE REMOVAL  02/07/2017  . TEE WITHOUT CARDIOVERSION  03/18/2011   Procedure: TRANSESOPHAGEAL ECHOCARDIOGRAM (TEE);  Surgeon: [name], MD;  Location: [facility] ENDOSCOPY;  Service: Cardiovascular;  Laterality: N/A;    FAMILY HISTORY Family History  Problem Relation Age of Onset  .  Diabetes Mellitus II Mother   . Stroke Mother     SOCIAL HISTORY Social History   Tobacco Use  . Smoking status: Never Smoker  . Smokeless tobacco: Never Used  Substance Use Topics  . Alcohol use: No  . Drug use: No         OPHTHALMIC EXAM:  Base Eye Exam    Visual Acuity (Snellen - Linear)      Right Left   Dist [place] 20/50 20/50 -1   Dist ph [place] 20/40 -2 20/50 +2       Tonometry (Tonopen, 8:20 AM)      Right Left   Pressure 25 15       Tonometry #2      Right Left   Pressure 16        Pupils      Dark Light Shape React APD   Right 2 1.5 Round Sluggish None   Left 3 2 Round Sluggish None       Visual Fields (Counting fingers)      Left Right    Full Full       Extraocular Movement      Right Left    Full, Ortho Full, Ortho       Neuro/Psych    Oriented x3:  Yes   Mood/Affect:  Normal       Dilation    Both eyes:  1.0% Mydriacyl, 2.5% Phenylephrine @ 8:20 AM        Slit Lamp and Fundus Exam    External Exam      Right Left   External wheelchair bound; bilateral lower limb amputee wheelchair bound; bilateral lower limb amputee       Slit Lamp Exam      Right Left   Lids/Lashes Dermatochalasis - upper lid, Telangiectasia of upper lid Dermatochalasis - upper lid, Telangiectasia of upper lid   Conjunctiva/Sclera Nasal Pinguecula Nasal and temproal Pinguecula   Cornea Clear Clear   Anterior Chamber Deep and quiet moderatre depth   Iris Round and moderate dilated, No NVI Round and dilated, No NVI   Lens  PC IOL in good position, Mild anterior capsule fimosis 2+ Nuclear sclerosis, 2+ Cortical cataract, pigment at 0700 anterior capsule   Vitreous Vitreous syneresis Vitreous syneresis  Fundus Exam      Right Left   Disc peripapillary pigmentation temporally, fibrosis overlying vessels, NVD regressed Normal   C/D Ratio obscured by fibrosis 0.5   Macula Flat, Blunted foveal reflex, scattered Mas - improved; no edema, Superior Epiretinal  membrane Blunted foveal reflex, Flat, scattered Mas, no edema, Retinal pigment epithelial mottling   Vessels Vascular attenuation; peripheral sclerosis Vascular attenuation; peripheral sclerosis   Periphery large pre-retinal hemorrhage inferiorly at 0700 - shrinking slowly, 360 scattered dot hemes, 360 PRP with good laser scars attached; few Mas, 360 PRP with good scaring, posterior patches with room for fill-in          IMAGING AND PROCEDURES  Imaging and Procedures for 07/27/17  OCT, Retina - OU - Both Eyes       Right Eye Quality was borderline. Central Foveal Thickness: 244. Progression has been stable. Findings include normal foveal contour, no IRF, no SRF, epiretinal membrane (Diffuse atrophy temporally).   Left Eye Quality was borderline. Central Foveal Thickness: 255. Progression has been stable. Findings include normal foveal contour, epiretinal membrane, no IRF, no SRF (Diffuse atrophy temporally ).   Notes *Images captured and stored on drive  Diagnosis / Impression:  No DME OU -- stable from prior  Clinical management:  See below  Abbreviations: NFP - Normal foveal profile. CME - cystoid macular edema. PED - pigment epithelial detachment. IRF - intraretinal fluid. SRF - subretinal fluid. EZ - ellipsoid zone. ERM - epiretinal membrane. ORA - outer retinal atrophy. ORT - outer retinal tubulation. SRHM - subretinal hyper-reflective material         Fluorescein Angiography Optos (Transit OD)       Right Eye Progression has no prior data. Early phase findings include retinal neovascularization, vascular perfusion defect, blockage, microaneurysm, staining. Mid/Late phase findings include retinal neovascularization, vascular perfusion defect, leakage, blockage, microaneurysm, staining (NVD regressed).   Left Eye Progression has no prior data. Early phase findings include vascular perfusion defect, microaneurysm, retinal neovascularization, staining. Mid/Late phase  findings include vascular perfusion defect, microaneurysm, retinal neovascularization, staining.   Notes *Images stored on drive  Impression:  Regressing NVE OU Good PRP laser scars in place Scattered patches of peripheral nonperfusion OU Microaneurysms w/ leakage OU       Panretinal Photocoagulation - OS - Left Eye       LASER PROCEDURE NOTE  Diagnosis:   Proliferative Diabetic Retinopathy, LEFT EYE  Procedure:  Pan-retinal photocoagulation using slit lamp laser, LEFT EYE, fill in  Anesthesia:  Topical  Surgeon: Rennis Chris, MD, PhD  Informed consent obtained, operative eye marked, and time out performed prior to initiation of laser.   Lumenis [id number] slit lamp laser Pattern: 2x2 square Power: 300 mW Duration: 50 msec  Spot size: 200 microns  # spots: 339 spots posterior and temporal fill in  Complications: None.  RTC: 4-6 wks  Patient tolerated the procedure well and received written and verbal post-procedure care information/education.                 ASSESSMENT/PLAN:    ICD-10-CM   1. 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 Retinal edema H35.81 OCT, Retina - OU - Both Eyes  4. Essential hypertension I10   5. Hypertensive retinopathy of both eyes H35.033 Fluorescein  Angiography Optos (Transit OD)  6. Pseudophakia, right eye Z96.1   7. 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 Proliferative diabetic retinopathy w/o macular edema, both eyes The incidence, risk factors for progression, natural history and treatment options for diabetic retinopathy  were discussed with patient.  The need for close monitoring of blood glucose, blood pressure, and serum lipids, avoiding cigarette or any  type of tobacco, and the need for long term follow up was also discussed with patient. - S/p PRP OD (01.17.19), fill-in (02.28.19) - S/p PRP OS (01.31.19), fill-in (04.04.19) - fibrosis overlying disc OD with fine NVD -- continues to regress - large preretinal hemorrhage inferiorly ?ruptured RAM? -- persistent but improving - OCT stable without diabetic macular edema, both eyes - FA shows regression of NVD OD and NVE OU; residual NVE OS posteriorly without good surrounding laser - recommend PRP fill in OS - pt wishes to proceed with fill in PRP OS today (5.16.19) - RBA of procedure discussed, questions answered - informed consent obtained and signed by POA - see procedure note - pt tolerated well - start PF QID OS x7 days - f/u in 4-6 wks - DFE/OCT   4,5. Hypertensive retinopathy OU - discussed importance of tight BP control - monitor  6. Pseudophakia OD  - s/p CE/IOL OD -- pt unable to recall surgeon  - stable, doing well  - monitor  7. Nuclear and cortical cataract OS - The symptoms of cataract, surgical options, and treatments and risks were discussed with patient. - discussed diagnosis and progression - approaching visual significance - monitor for now   Ophthalmic Meds Ordered this visit:  No orders of the defined types were placed in this encounter.      Return for 4-6 wks.  There are no Patient Instructions on file for this visit.   Explained the diagnoses, plan, and follow up with the patient and they expressed understanding.  Patient expressed understanding of the importance of proper follow up care.   This document serves as a record of services personally performed by Karie Chimera, MD, PhD. It was created on their behalf by Virgilio Belling, COA, a certified ophthalmic assistant. The creation of this record is the provider's dictation and/or activities during the visit.  Electronically signed by: Virgilio Belling, COA  07/27/17 11:51 PM   Karie Chimera,  M.D., Ph.D. Diseases & Surgery of the Retina and Vitreous [facility] 09/07/17  I have reviewed the above documentation for accuracy and completeness, and I agree with the above. Karie Chimera, M.D., Ph.D. 09/07/17 11:51 PM    Abbreviations: M myopia (nearsighted); A astigmatism; H hyperopia (farsighted); P presbyopia; Mrx spectacle prescription;  CTL contact lenses; OD right eye; OS left eye; OU both eyes  XT exotropia; ET esotropia; PEK punctate epithelial keratitis; PEE punctate epithelial erosions; DES dry eye syndrome; MGD meibomian gland dysfunction; ATs artificial tears; PFAT's preservative free artificial tears; NSC nuclear sclerotic cataract; PSC posterior subcapsular cataract; ERM epi-retinal membrane; PVD posterior vitreous detachment; RD retinal detachment; DM diabetes mellitus; DR diabetic retinopathy; NPDR non-proliferative diabetic retinopathy; PDR proliferative diabetic retinopathy; CSME clinically significant macular edema; DME diabetic macular edema; dbh dot blot hemorrhages; CWS cotton wool spot; POAG primary open angle glaucoma; C/D cup-to-disc ratio; HVF humphrey visual field; GVF goldmann visual field; OCT optical coherence tomography; IOP intraocular pressure; BRVO Branch retinal vein occlusion; CRVO central retinal vein occlusion; CRAO central retinal artery occlusion; BRAO branch  retinal artery occlusion; RT retinal tear; SB scleral buckle; PPV pars plana vitrectomy; VH Vitreous hemorrhage; PRP panretinal laser photocoagulation; IVK intravitreal kenalog; VMT vitreomacular traction; MH Macular hole;  NVD neovascularization of the disc; NVE neovascularization elsewhere; AREDS age related eye disease study; ARMD age related macular degeneration; POAG primary open angle glaucoma; EBMD epithelial/anterior basement membrane dystrophy; ACIOL anterior chamber intraocular lens; IOL intraocular lens; PCIOL posterior chamber intraocular lens; Phaco/IOL  phacoemulsification with intraocular lens placement; [place] photorefractive keratectomy; LASIK laser assisted in situ keratomileusis; HTN hypertension; DM diabetes mellitus; COPD chronic obstructive pulmonary disease

## 2017-09-07 ENCOUNTER — Encounter (INDEPENDENT_AMBULATORY_CARE_PROVIDER_SITE_OTHER): Payer: Self-pay | Admitting: Ophthalmology

## 2017-09-07 ENCOUNTER — Ambulatory Visit (INDEPENDENT_AMBULATORY_CARE_PROVIDER_SITE_OTHER): Payer: Medicare Other | Admitting: Ophthalmology

## 2017-09-07 DIAGNOSIS — H35033 Hypertensive retinopathy, bilateral: Secondary | ICD-10-CM

## 2017-09-07 DIAGNOSIS — H25812 Combined forms of age-related cataract, left eye: Secondary | ICD-10-CM | POA: Diagnosis not present

## 2017-09-07 DIAGNOSIS — H3581 Retinal edema: Secondary | ICD-10-CM

## 2017-09-07 DIAGNOSIS — I10 Essential (primary) hypertension: Secondary | ICD-10-CM | POA: Diagnosis not present

## 2017-09-07 DIAGNOSIS — E11319 Type 2 diabetes mellitus with unspecified diabetic retinopathy without macular edema: Secondary | ICD-10-CM

## 2017-09-07 DIAGNOSIS — E113593 Type 2 diabetes mellitus with proliferative diabetic retinopathy without macular edema, bilateral: Secondary | ICD-10-CM

## 2017-09-07 DIAGNOSIS — Z961 Presence of intraocular lens: Secondary | ICD-10-CM | POA: Diagnosis not present

## 2017-09-07 DIAGNOSIS — H35049 Retinal micro-aneurysms, unspecified, unspecified eye: Secondary | ICD-10-CM

## 2017-10-13 NOTE — Progress Notes (Signed)
[facility] - Clinic Note  10/17/2017     CHIEF COMPLAINT Patient presents for Retina Follow Up   HISTORY OF PRESENT ILLNESS: [name] is a 57 y.o. female who presents to the clinic today for:   HPI    Retina Follow Up    Patient presents with  Diabetic Retinopathy.  In both eyes.  This started years ago.  Severity is moderate.  Duration of 6 weeks.  Since onset it is stable.  I, the attending physician,  performed the HPI with the patient and updated documentation appropriately.          Comments    F/U PDR OU; Pt states she feels OU VA is improved; Pt states she "still has trouble seeing fine print"; pt states OU are comfortable; Pt states she has floaters off and on, denies flashes, denies wavy VA, denies ocular pain; Pt states last CBG is unknown;        Last edited by Rennis Chris, MD on 10/17/2017  9:21 AM. (History)       Referring physician: Terrial Rhodes, MD [address] [place], [place] [address]  HISTORICAL INFORMATION:   Selected notes from the MEDICAL RECORD NUMBER Referred by Dr. Hansel Feinstein (Nephrologist) for DM exam;  LEE-  Ocular Hx-  PMH- DM    CURRENT MEDICATIONS: Current Outpatient Medications (Ophthalmic Drugs)  Medication Sig  . prednisoLONE acetate (PRED FORTE) 1 % ophthalmic suspension Place 1 drop into the right eye 4 (four) times daily.  Marland Kitchen Propylene Glycol-Glycerin (ARTIFICIAL TEARS) 1-0.3 % SOLN Place 1 drop into both eyes every 12 (twelve) hours as needed (for eye iritation).   No current facility-administered medications for this visit.  (Ophthalmic Drugs)   Current Outpatient Medications (Other)  Medication Sig  . aspirin 81 MG chewable tablet Place 1 tablet (81 mg total) into feeding tube daily. (Patient taking differently: Chew 81 mg by mouth daily. )  . atorvastatin (LIPITOR) 10 MG tablet Take 10 mg by mouth daily.  . Cholecalciferol (VITAMIN D-3) 5000 units TABS Take 5,000 Units by mouth daily.  .  cinacalcet (SENSIPAR) 30 MG tablet Take 30 mg by mouth at bedtime.  . docusate sodium (COLACE) 50 MG capsule Take 50 mg by mouth every evening.  . gabapentin (NEURONTIN) 100 MG capsule Take 100 mg by mouth at bedtime.  Marland Kitchen glucagon (GLUCAGON EMERGENCY) 1 MG injection Inject 1 mg into the vein once as needed (for blood sugar).  Marland Kitchen glycerin adult 2 g suppository Place 1 suppository rectally as needed for constipation.  Marland Kitchen guaiFENesin (ROBITUSSIN) 100 MG/5ML SOLN Take 20 mLs by mouth every 8 (eight) hours as needed for cough or to loosen phlegm.  Marland Kitchen HYDROcodone-acetaminophen (NORCO/VICODIN) 5-325 MG tablet Take 1-2 tablets by mouth every 6 (six) hours as needed.  . hydrocortisone cream 1 % Apply 1 application topically every 8 (eight) hours as needed for itching.  . Infant Care Products (DERMACLOUD) CREA Apply topically 3 (three) times daily. To buttocks  . insulin aspart (NOVOLOG) 100 UNIT/ML injection Inject 2-10 Units into the skin 3 (three) times daily before meals. Per sliding scale 0-59: notify doctor 60-100= 0 units 101-199 = 2 units 200-299 = 6 units 300-349 = 8 units 350-399 = 10 units 400-100 = Notify Doctor  . lamoTRIgine (LAMICTAL) 25 MG tablet Take 25 mg by mouth at bedtime.  Marland Kitchen levothyroxine (SYNTHROID, LEVOTHROID) 25 MCG tablet Place 1 tablet (25 mcg total) into feeding tube daily before breakfast. (Patient taking differently:  Take 25 mcg by mouth daily before breakfast. )  . Melatonin-Pyridoxine 3-1 MG TABS Take 1 tablet by mouth at bedtime.  . mirtazapine (REMERON) 30 MG tablet Take 30 mg by mouth at bedtime.  Marland Kitchen. neomycin-bacitracin-polymyxin (NEOSPORIN) ointment Apply 1 application topically daily.  . nitrofurantoin, macrocrystal-monohydrate, (MACROBID) 100 MG capsule Take 1 capsule (100 mg total) by mouth 2 (two) times daily. X 7 days  . Nutritional Supplements (FEEDING SUPPLEMENT, NEPRO CARB STEADY,) LIQD Place 360 mLs into feeding tube 3 (three) times daily with meals.  .  ondansetron (ZOFRAN) 4 MG tablet Take 4 mg by mouth every 8 (eight) hours as needed for nausea or vomiting.  . pantoprazole (PROTONIX) 40 MG tablet Take 40 mg by mouth every evening.   . polyethylene glycol (MIRALAX) packet Take 17 g by mouth daily.  . predniSONE (DELTASONE) 50 MG tablet Take 1 tablet daily x 5 days.  . Probiotic Product (PROBIOTIC PO) Take 250 mg by mouth 2 (two) times daily.  . promethazine (PHENERGAN) 6.25 MG/5ML syrup Take 12.5 mg by mouth every 6 (six) hours as needed for nausea or vomiting.  . senna-docusate (SENOKOT-S) 8.6-50 MG tablet Take 2 tablets by mouth at bedtime.   . sevelamer carbonate (RENVELA) 800 MG tablet Take 400 mg by mouth 3 (three) times daily with meals.  . sucralfate (CARAFATE) 1 g tablet Take 1 g by mouth 4 (four) times daily.  . traMADol (ULTRAM) 50 MG tablet Take 1 tablet (50 mg total) by mouth every 12 (twelve) hours as needed for moderate pain. (Patient taking differently: Take 50 mg by mouth every 8 (eight) hours as needed for moderate pain. )  . triamcinolone (KENALOG) 0.025 % cream Apply 1 application topically 2 (two) times daily.  . Water For Irrigation, Sterile (FREE WATER) SOLN Place 100 mLs into feeding tube every 8 (eight) hours.  Weyman Croon. Witch Hazel (PREPARATION H TOTABLES WIPES) 50 % PADS Apply topically every 8 (eight) hours as needed (for hemmorrhoids).   No current facility-administered medications for this visit.  (Other)      REVIEW OF SYSTEMS: ROS    Positive for: Skin, Musculoskeletal, Endocrine, Cardiovascular, Eyes, Psychiatric   Negative for: Constitutional, Gastrointestinal, Neurological, Genitourinary, HENT, Respiratory, Allergic/Imm, Heme/Lymph   Last edited by Concepcion ElkFabian, Meredith C, COA on 10/17/2017  8:28 AM. (History)       ALLERGIES Allergies  Allergen Reactions  . Codeine Hives  . Levofloxacin Other (See Comments)    Unknown, but noted on MAR  . Penicillins Hives    Has patient had a PCN reaction causing immediate  rash, facial/tongue/throat swelling, SOB or lightheadedness with hypotension: Yes Has patient had a PCN reaction causing severe rash involving mucus membranes or skin necrosis: Unk Has patient had a PCN reaction that required hospitalization: Unk Has patient had a PCN reaction occurring within the last 10 years: Unk If all of the above answers are "NO", then may proceed with Cephalosporin use.     PAST MEDICAL HISTORY Past Medical History:  Diagnosis Date  . Anemia   . Blood transfusion   . Chronic back pain   . Dementia   . Diabetes mellitus   . Hemodialysis patient (HCC) 03/15/11   "Tues; Thurs; Sat[facility]"  . Hypertension   . PEA (Pulseless electrical activity) (HCC)   . Peripheral vascular disease (HCC)   . Rash 03/15/11   "admitted me to find out what its' from; UE, waist, thighs, groin"  . Renal failure  hd 4/12  . Stroke [facility])    Past Surgical History:  Procedure Laterality Date  . AMPUTATION  03/04/2011   Procedure: AMPUTATION DIGIT;  Surgeon: [name], MD;  Location: [facility] OR;  Service: Orthopedics;  Laterality: Right;  RT GREAT TOE AMP  . AMPUTATION Right 03/13/2015   Procedure: RIGHT  ABOVE KNEE AMPUTATION;  Surgeon: Pryor Ochoa, MD;  Location: [facility] OR;  Service: Vascular;  Laterality: Right;  . ARTERIOVENOUS GRAFT PLACEMENT  10/01/10   left upper arm  . BELOW KNEE LEG AMPUTATION  2004   left  . CARPAL TUNNEL RELEASE  ~ 2009   left wrist  . CATARACT EXTRACTION W/ INTRAOCULAR LENS IMPLANT  12/2009   right eye  . HAND AMPUTATION Left   . INSERTION OF DIALYSIS CATHETER  07/29/10   right subclavian  . IR FLUORO RM 30-60 MIN  08/16/2016  . IR GASTROSTOMY TUBE MOD SED  08/23/2016  . IR GASTROSTOMY TUBE REMOVAL  02/07/2017  . TEE WITHOUT CARDIOVERSION  03/18/2011   Procedure: TRANSESOPHAGEAL ECHOCARDIOGRAM (TEE);  Surgeon: [name], MD;  Location: [facility] ENDOSCOPY;  Service: Cardiovascular;  Laterality: N/A;    FAMILY HISTORY Family  History  Problem Relation Age of Onset  . Diabetes Mellitus II Mother   . Stroke Mother     SOCIAL HISTORY Social History   Tobacco Use  . Smoking status: Never Smoker  . Smokeless tobacco: Never Used  Substance Use Topics  . Alcohol use: No  . Drug use: No         OPHTHALMIC EXAM:  Base Eye Exam    Visual Acuity (Snellen - Linear)      Right Left   Dist [place] 20/40 20/50   Dist ph [place] 20/30 20/50 (NI)       Tonometry (Tonopen, 8:40 AM)      Right Left   Pressure 18 18       Pupils      Dark Light Shape React APD   Right 5 5 Round Minimal None   Left 4 4 Round Minimal None       Visual Fields (Counting fingers)      Left Right    Full Full       Extraocular Movement      Right Left    Full, Ortho Full, Ortho       Neuro/Psych    Oriented x3:  Yes   Mood/Affect:  Normal       Dilation    Both eyes:  1.0% Mydriacyl, 2.5% Phenylephrine @ 8:40 AM        Slit Lamp and Fundus Exam    External Exam      Right Left   External wheelchair bound; bilateral lower limb amputee wheelchair bound; bilateral lower limb amputee       Slit Lamp Exam      Right Left   Lids/Lashes Dermatochalasis - upper lid, Telangiectasia of upper lid Dermatochalasis - upper lid, Telangiectasia of upper lid   Conjunctiva/Sclera Nasal Pinguecula Nasal and temproal Pinguecula   Cornea Clear Clear   Anterior Chamber Deep and quiet moderatre depth   Iris Round and moderate dilated, No NVI Round and dilated, No NVI   Lens  PC IOL in good position, Mild anterior capsule fimosis 2+ Nuclear sclerosis, 2+ Cortical cataract, pigment at 0700 anterior capsule   Vitreous Vitreous syneresis Vitreous syneresis       Fundus Exam      Right Left  Disc peripapillary pigmentation temporally, fibrosis overlying vessels, NVD regressed Normal   C/D Ratio obscured by fibrosis 0.5   Macula Flat, Blunted foveal reflex, scattered Mas - improved; no edema, Superior Epiretinal membrane Blunted foveal  reflex, Flat, scattered Mas, no edema, Retinal pigment epithelial mottling   Vessels Vascular attenuation; peripheral sclerosis Vascular attenuation; peripheral sclerosis   Periphery pre-retinal hemorrhage inferiorly at 0700 - improving/shrinking, 360 scattered dot hemes, 360 PRP with good laser scars attached; few Mas, 360 PRP with good scaring, posterior patches with room for fill-in          IMAGING AND PROCEDURES  Imaging and Procedures for 07/27/17  OCT, Retina - OU - Both Eyes       Right Eye Quality was borderline. Central Foveal Thickness: 243. Progression has been stable. Findings include normal foveal contour, no IRF, no SRF, epiretinal membrane (Diffuse atrophy temporally).   Left Eye Quality was borderline. Central Foveal Thickness: 259. Progression has been stable. Findings include normal foveal contour, epiretinal membrane, no IRF, no SRF (Diffuse atrophy temporally ).   Notes *Images captured and stored on drive  Diagnosis / Impression:  No DME OU -- stable from prior  Clinical management:  See below  Abbreviations: NFP - Normal foveal profile. CME - cystoid macular edema. PED - pigment epithelial detachment. IRF - intraretinal fluid. SRF - subretinal fluid. EZ - ellipsoid zone. ERM - epiretinal membrane. ORA - outer retinal atrophy. ORT - outer retinal tubulation. SRHM - subretinal hyper-reflective material                  ASSESSMENT/PLAN:    ICD-10-CM   1. 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 Retinal edema H35.81 OCT, Retina - OU - Both Eyes  4. Essential hypertension I10   5. Hypertensive retinopathy of both eyes H35.033   6. Pseudophakia, right eye Z96.1   7. Combined forms of age-related cataract, left eye H25.812   8. Proliferative diabetic retinopathy of right  eye without macular edema associated with type 2 diabetes mellitus (HCC) E11.3591   9. Moderate nonproliferative diabetic retinopathy of left eye without macular edema associated with type 2 diabetes mellitus (HCC) H84.6962     1-3. Proliferative diabetic retinopathy w/o macular edema, both eyes - S/p PRP OD (01.17.19), fill-in (02.28.19) - S/p PRP OS (01.31.19), fill-in (04.04.19), fill-in (05.16.19) - fibrosis overlying disc OD with fine NVD -- regressed - large preretinal hemorrhage inferiorly ?ruptured RAM? -- persistent but improving - OCT stable without diabetic macular edema, both eyes - f/u in 6 wks - DFE/OCT, repeat FA transit OD  4,5. Hypertensive retinopathy OU - discussed importance of tight BP control - monitor  6. Pseudophakia OD  - s/p CE/IOL OD -- pt unable to recall surgeon  - stable, doing well  - monitor  7. Nuclear and cortical cataract OS - The symptoms of cataract, surgical options, and treatments and risks were discussed with patient. - discussed diagnosis and progression - approaching visual significance - monitor for now   Ophthalmic Meds Ordered this visit:  No orders of the defined types were placed in this encounter.      Return in about 6 weeks (around 11/28/2017) for F/U PDR OU, DFE, OCT.  There are no Patient Instructions on file for this visit.   Explained the diagnoses, plan, and  follow up with the patient and they expressed understanding.  Patient expressed understanding of the importance of proper follow up care.   This document serves as a record of services personally performed by Karie Chimera, MD, PhD. It was created on their behalf by Virgilio Belling, COA, a certified ophthalmic assistant. The creation of this record is the provider's dictation and/or activities during the visit.  Electronically signed by: Virgilio Belling, COA  06.21.19 11:47 AM   Karie Chimera, M.D., Ph.D. Diseases & Surgery of the Retina and Vitreous [facility]  I have reviewed the above documentation for accuracy and completeness, and I agree with the above. Karie Chimera, M.D., Ph.D. 10/19/17 11:51 AM    Abbreviations: M myopia (nearsighted); A astigmatism; H hyperopia (farsighted); P presbyopia; Mrx spectacle prescription;  CTL contact lenses; OD right eye; OS left eye; OU both eyes  XT exotropia; ET esotropia; PEK punctate epithelial keratitis; PEE punctate epithelial erosions; DES dry eye syndrome; MGD meibomian gland dysfunction; ATs artificial tears; PFAT's preservative free artificial tears; NSC nuclear sclerotic cataract; PSC posterior subcapsular cataract; ERM epi-retinal membrane; PVD posterior vitreous detachment; RD retinal detachment; DM diabetes mellitus; DR diabetic retinopathy; NPDR non-proliferative diabetic retinopathy; PDR proliferative diabetic retinopathy; CSME clinically significant macular edema; DME diabetic macular edema; dbh dot blot hemorrhages; CWS cotton wool spot; POAG primary open angle glaucoma; C/D cup-to-disc ratio; HVF humphrey visual field; GVF goldmann visual field; OCT optical coherence tomography; IOP intraocular pressure; BRVO Branch retinal vein occlusion; CRVO central retinal vein occlusion; CRAO central retinal artery occlusion; BRAO branch retinal artery occlusion; RT retinal tear; SB scleral buckle; PPV pars plana vitrectomy; VH Vitreous hemorrhage; PRP panretinal laser photocoagulation; IVK intravitreal kenalog; VMT vitreomacular traction; MH Macular hole;  NVD neovascularization of the disc; NVE neovascularization elsewhere; AREDS age related eye disease study; ARMD age related macular degeneration; POAG primary open angle glaucoma; EBMD epithelial/anterior basement membrane dystrophy; ACIOL anterior chamber intraocular lens; IOL intraocular lens; PCIOL posterior chamber intraocular lens; Phaco/IOL phacoemulsification with intraocular lens placement; PRK photorefractive keratectomy; LASIK  laser assisted in situ keratomileusis; HTN hypertension; DM diabetes mellitus; COPD chronic obstructive pulmonary disease

## 2017-10-17 ENCOUNTER — Ambulatory Visit (INDEPENDENT_AMBULATORY_CARE_PROVIDER_SITE_OTHER): Payer: Medicare Other | Admitting: Ophthalmology

## 2017-10-17 ENCOUNTER — Encounter (INDEPENDENT_AMBULATORY_CARE_PROVIDER_SITE_OTHER): Payer: Self-pay | Admitting: Ophthalmology

## 2017-10-17 DIAGNOSIS — H35049 Retinal micro-aneurysms, unspecified, unspecified eye: Secondary | ICD-10-CM

## 2017-10-17 DIAGNOSIS — Z961 Presence of intraocular lens: Secondary | ICD-10-CM | POA: Diagnosis not present

## 2017-10-17 DIAGNOSIS — H25812 Combined forms of age-related cataract, left eye: Secondary | ICD-10-CM | POA: Diagnosis not present

## 2017-10-17 DIAGNOSIS — I10 Essential (primary) hypertension: Secondary | ICD-10-CM | POA: Diagnosis not present

## 2017-10-17 DIAGNOSIS — H3581 Retinal edema: Secondary | ICD-10-CM

## 2017-10-17 DIAGNOSIS — E11319 Type 2 diabetes mellitus with unspecified diabetic retinopathy without macular edema: Secondary | ICD-10-CM

## 2017-10-17 DIAGNOSIS — E113593 Type 2 diabetes mellitus with proliferative diabetic retinopathy without macular edema, bilateral: Secondary | ICD-10-CM

## 2017-10-17 DIAGNOSIS — H35033 Hypertensive retinopathy, bilateral: Secondary | ICD-10-CM

## 2017-10-19 ENCOUNTER — Encounter (INDEPENDENT_AMBULATORY_CARE_PROVIDER_SITE_OTHER): Payer: Self-pay | Admitting: Ophthalmology

## 2017-11-02 NOTE — Telephone Encounter (Signed)
Phone call attempt 

## 2017-11-27 NOTE — Progress Notes (Signed)
[facility] - Clinic Note  11/28/2017     CHIEF COMPLAINT Patient presents for Retina Follow Up   HISTORY OF PRESENT ILLNESS: [name] is a 57 y.o. female who presents to the clinic today for:   HPI    Retina Follow Up    Patient presents with  Diabetic Retinopathy.  In both eyes.  Severity is moderate.  Duration of 6 weeks.  Since onset it is stable.  I, the attending physician,  performed the HPI with the patient and updated documentation appropriately.          Comments    Pt presents for PDR OU f/u, pt states her vision has been better since last visit, pt denies new flashes, floaters, pain or wavy vision, pt states she does check her blood sugar herself, but is unsure what it was last time she checked it,        Last edited by Rennis Chris, MD on 11/28/2017  8:55 AM. (History)    pt states everything has been going well, she is pleased with her vision at this point   Referring physician: Terrial Rhodes, MD [address] [place], [place] [address]  HISTORICAL INFORMATION:   Selected notes from the MEDICAL RECORD NUMBER Referred by Dr. Hansel Feinstein (Nephrologist) for DM exam;  LEE-  Ocular Hx-  PMH- DM    CURRENT MEDICATIONS: Current Outpatient Medications (Ophthalmic Drugs)  Medication Sig  . prednisoLONE acetate (PRED FORTE) 1 % ophthalmic suspension Place 1 drop into the right eye 4 (four) times daily.  Marland Kitchen Propylene Glycol-Glycerin (ARTIFICIAL TEARS) 1-0.3 % SOLN Place 1 drop into both eyes every 12 (twelve) hours as needed (for eye iritation).   No current facility-administered medications for this visit.  (Ophthalmic Drugs)   Current Outpatient Medications (Other)  Medication Sig  . aspirin 81 MG chewable tablet Place 1 tablet (81 mg total) into feeding tube daily. (Patient taking differently: Chew 81 mg by mouth daily. )  . atorvastatin (LIPITOR) 10 MG tablet Take 10 mg by mouth daily.  . Cholecalciferol (VITAMIN D-3) 5000 units  TABS Take 5,000 Units by mouth daily.  . cinacalcet (SENSIPAR) 30 MG tablet Take 30 mg by mouth at bedtime.  . docusate sodium (COLACE) 50 MG capsule Take 50 mg by mouth every evening.  . gabapentin (NEURONTIN) 100 MG capsule Take 100 mg by mouth at bedtime.  Marland Kitchen glucagon (GLUCAGON EMERGENCY) 1 MG injection Inject 1 mg into the vein once as needed (for blood sugar).  Marland Kitchen glycerin adult 2 g suppository Place 1 suppository rectally as needed for constipation.  Marland Kitchen guaiFENesin (ROBITUSSIN) 100 MG/5ML SOLN Take 20 mLs by mouth every 8 (eight) hours as needed for cough or to loosen phlegm.  Marland Kitchen HYDROcodone-acetaminophen (NORCO/VICODIN) 5-325 MG tablet Take 1-2 tablets by mouth every 6 (six) hours as needed.  . hydrocortisone cream 1 % Apply 1 application topically every 8 (eight) hours as needed for itching.  . Infant Care Products (DERMACLOUD) CREA Apply topically 3 (three) times daily. To buttocks  . insulin aspart (NOVOLOG) 100 UNIT/ML injection Inject 2-10 Units into the skin 3 (three) times daily before meals. Per sliding scale 0-59: notify doctor 60-100= 0 units 101-199 = 2 units 200-299 = 6 units 300-349 = 8 units 350-399 = 10 units 400-100 = Notify Doctor  . lamoTRIgine (LAMICTAL) 25 MG tablet Take 25 mg by mouth at bedtime.  Marland Kitchen levothyroxine (SYNTHROID, LEVOTHROID) 25 MCG tablet Place 1 tablet (25 mcg total) into  feeding tube daily before breakfast. (Patient taking differently: Take 25 mcg by mouth daily before breakfast. )  . Melatonin-Pyridoxine 3-1 MG TABS Take 1 tablet by mouth at bedtime.  . mirtazapine (REMERON) 30 MG tablet Take 30 mg by mouth at bedtime.  Marland Kitchen neomycin-bacitracin-polymyxin (NEOSPORIN) ointment Apply 1 application topically daily.  . nitrofurantoin, macrocrystal-monohydrate, (MACROBID) 100 MG capsule Take 1 capsule (100 mg total) by mouth 2 (two) times daily. X 7 days  . Nutritional Supplements (FEEDING SUPPLEMENT, NEPRO CARB STEADY,) LIQD Place 360 mLs into feeding tube 3  (three) times daily with meals.  . ondansetron (ZOFRAN) 4 MG tablet Take 4 mg by mouth every 8 (eight) hours as needed for nausea or vomiting.  . pantoprazole (PROTONIX) 40 MG tablet Take 40 mg by mouth every evening.   . polyethylene glycol (MIRALAX) packet Take 17 g by mouth daily.  . predniSONE (DELTASONE) 50 MG tablet Take 1 tablet daily x 5 days.  . Probiotic Product (PROBIOTIC PO) Take 250 mg by mouth 2 (two) times daily.  . promethazine (PHENERGAN) 6.25 MG/5ML syrup Take 12.5 mg by mouth every 6 (six) hours as needed for nausea or vomiting.  . senna-docusate (SENOKOT-S) 8.6-50 MG tablet Take 2 tablets by mouth at bedtime.   . sevelamer carbonate (RENVELA) 800 MG tablet Take 400 mg by mouth 3 (three) times daily with meals.  . sucralfate (CARAFATE) 1 g tablet Take 1 g by mouth 4 (four) times daily.  . traMADol (ULTRAM) 50 MG tablet Take 1 tablet (50 mg total) by mouth every 12 (twelve) hours as needed for moderate pain. (Patient taking differently: Take 50 mg by mouth every 8 (eight) hours as needed for moderate pain. )  . triamcinolone (KENALOG) 0.025 % cream Apply 1 application topically 2 (two) times daily.  . Water For Irrigation, Sterile (FREE WATER) SOLN Place 100 mLs into feeding tube every 8 (eight) hours.  Weyman Croon Hazel (PREPARATION H TOTABLES WIPES) 50 % PADS Apply topically every 8 (eight) hours as needed (for hemmorrhoids).   No current facility-administered medications for this visit.  (Other)      REVIEW OF SYSTEMS: ROS    Positive for: Neurological, Endocrine, Cardiovascular, Eyes, Heme/Lymph   Negative for: Constitutional, Gastrointestinal, Skin, Genitourinary, Musculoskeletal, HENT, Respiratory, Psychiatric, Allergic/Imm   Last edited by Posey Boyer, COT on 11/28/2017  8:03 AM. (History)       ALLERGIES Allergies  Allergen Reactions  . Codeine Hives  . Levofloxacin Other (See Comments)    Unknown, but noted on MAR  . Penicillins Hives    Has patient had a  PCN reaction causing immediate rash, facial/tongue/throat swelling, SOB or lightheadedness with hypotension: Yes Has patient had a PCN reaction causing severe rash involving mucus membranes or skin necrosis: Unk Has patient had a PCN reaction that required hospitalization: Unk Has patient had a PCN reaction occurring within the last 10 years: Unk If all of the above answers are "NO", then may proceed with Cephalosporin use.     PAST MEDICAL HISTORY Past Medical History:  Diagnosis Date  . Anemia   . Blood transfusion   . Chronic back pain   . Dementia   . Diabetes mellitus   . Hemodialysis patient (HCC) 03/15/11   "Tues; Thurs; Sat; [facility]"  . Hypertension   . PEA (Pulseless electrical activity) (HCC)   . Peripheral vascular disease (HCC)   . Rash 03/15/11   "admitted me to find out what its' from; UE, waist, thighs,  groin"  . Renal failure    hd 4/12  . Stroke [facility])    Past Surgical History:  Procedure Laterality Date  . AMPUTATION  03/04/2011   Procedure: AMPUTATION DIGIT;  Surgeon: [name], MD;  Location: [facility] OR;  Service: Orthopedics;  Laterality: Right;  RT GREAT TOE AMP  . AMPUTATION Right 03/13/2015   Procedure: RIGHT  ABOVE KNEE AMPUTATION;  Surgeon: Pryor Ochoa, MD;  Location[facility] OR;  Service: Vascular;  Laterality: Right;  . ARTERIOVENOUS GRAFT PLACEMENT  10/01/10   left upper arm  . BELOW KNEE LEG AMPUTATION  2004   left  . CARPAL TUNNEL RELEASE  ~ 2009   left wrist  . CATARACT EXTRACTION W/ INTRAOCULAR LENS IMPLANT  12/2009   right eye  . HAND AMPUTATION Left   . INSERTION OF DIALYSIS CATHETER  07/29/10   right subclavian  . IR FLUORO RM 30-60 MIN  08/16/2016  . IR GASTROSTOMY TUBE MOD SED  08/23/2016  . IR GASTROSTOMY TUBE REMOVAL  02/07/2017  . TEE WITHOUT CARDIOVERSION  03/18/2011   Procedure: TRANSESOPHAGEAL ECHOCARDIOGRAM (TEE);  Surgeon: [name], MD;  Location: [facility] ENDOSCOPY;  Service: Cardiovascular;  Laterality: N/A;     FAMILY HISTORY Family History  Problem Relation Age of Onset  . Diabetes Mellitus II Mother   . Stroke Mother     SOCIAL HISTORY Social History   Tobacco Use  . Smoking status: Never Smoker  . Smokeless tobacco: Never Used  Substance Use Topics  . Alcohol use: No  . Drug use: No         OPHTHALMIC EXAM:  Base Eye Exam    Visual Acuity (Snellen - Linear)      Right Left   Dist [place] 20/40 -2 20/40 -2   Dist ph [place] NI NI   Correction:  Glasses       Tonometry (Tonopen, 8:08 AM)      Right Left   Pressure 17 18       Pupils      Dark Light Shape React APD   Right 4 3 Round Brisk None   Left 4 3 Round Brisk None       Visual Fields (Counting fingers)      Left Right    Full Full       Extraocular Movement      Right Left    Full, Ortho Full, Ortho       Neuro/Psych    Oriented x3:  Yes   Mood/Affect:  Normal       Dilation    Both eyes:  1.0% Mydriacyl, 2.5% Phenylephrine @ 8:08 AM        Slit Lamp and Fundus Exam    External Exam      Right Left   External wheelchair bound; bilateral lower limb amputee wheelchair bound; bilateral lower limb amputee       Slit Lamp Exam      Right Left   Lids/Lashes Dermatochalasis - upper lid, Telangiectasia of upper lid Dermatochalasis - upper lid, Telangiectasia of upper lid   Conjunctiva/Sclera Nasal Pinguecula Nasal and temproal Pinguecula   Cornea Clear Clear   Anterior Chamber Deep and quiet moderatre depth   Iris Round and moderate dilated, No NVI Round and dilated, No NVI   Lens  PC IOL in good position, Mild anterior capsule fimosis 2+ Nuclear sclerosis, 2+ Cortical cataract, pigment at 0700 anterior capsule   Vitreous Vitreous syneresis Vitreous syneresis  Fundus Exam      Right Left   Disc peripapillary pigmentation temporally, fibrosis overlying vessels, NVD regressed Normal   C/D Ratio obscured by fibrosis 0.3   Macula Flat, Blunted foveal reflex, scattered Mas - improved; no edema,  Epiretinal membrane Blunted foveal reflex, Flat, scattered Mas, no edema, Retinal pigment epithelial mottling   Vessels Vascular attenuation; peripheral sclerosis Vascular attenuation; peripheral sclerosis   Periphery pre-retinal hemorrhage inferiorly at 0700 - improving/shrinking, 360 scattered dot hemes, 360 PRP with good laser scars, mild room for fill in superonasal attached; few Mas, 360 PRP with good scarring, room for fill in nasally          IMAGING AND PROCEDURES  Imaging and Procedures for 07/27/17  OCT, Retina - OU - Both Eyes       Right Eye Quality was good. Central Foveal Thickness: 239. Progression has been stable. Findings include normal foveal contour, no IRF, no SRF, epiretinal membrane (Diffuse atrophy temporally).   Left Eye Quality was good. Central Foveal Thickness: 251. Progression has been stable. Findings include normal foveal contour, epiretinal membrane, no IRF, no SRF (Diffuse atrophy temporally ).   Notes *Images captured and stored on drive  Diagnosis / Impression:  No DME OU -- stable from prior  Clinical management:  See below  Abbreviations: NFP - Normal foveal profile. CME - cystoid macular edema. PED - pigment epithelial detachment. IRF - intraretinal fluid. SRF - subretinal fluid. EZ - ellipsoid zone. ERM - epiretinal membrane. ORA - outer retinal atrophy. ORT - outer retinal tubulation. SRHM - subretinal hyper-reflective material         Fluorescein Angiography Optos (Transit OD)       Right Eye Progression has improved. Early phase findings include retinal neovascularization, blockage, microaneurysm, staining. Mid/Late phase findings include retinal neovascularization, leakage, blockage, microaneurysm, staining (NVD regressed).   Left Eye Progression has improved. Early phase findings include microaneurysm, retinal neovascularization, staining. Mid/Late phase findings include microaneurysm, retinal neovascularization, staining,  leakage.   Notes *Images stored on drive  Impression: Regressing NVE OU -- focal patches of persistent NVE OU Good PRP laser scars in place Scattered patches of peripheral nonperfusion OU Microaneurysms w/ leakage OU improved       Panretinal Photocoagulation - OS - Left Eye       LASER PROCEDURE NOTE  Diagnosis:   Proliferative Diabetic Retinopathy, LEFT EYE  Procedure:  Pan-retinal photocoagulation using laser indirect ophthalmoscope, LEFT EYE, fill-in  Anesthesia:  Topical  Surgeon: Rennis ChrisBrian Prayan Ulin, MD, PhD   Informed consent obtained, operative eye marked, and time out performed prior to initiation of laser.   Lumenis [id number] Laser Indirect Ophthalmoscope Power: 290 mW Duration: 100 msec   # spots: 506 spots fill-in mostly nasal hemisphere  Complications: None.  RTC: 4 wks   Patient tolerated the procedure well and received written and verbal post-procedure care information/education.                 ASSESSMENT/PLAN:    ICD-10-CM   1. 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 Retinal edema H35.81 OCT, Retina - OU - Both Eyes  4. Essential hypertension I10   5. Hypertensive retinopathy of both eyes H35.033 Fluorescein Angiography Optos (Transit OD)  6. Pseudophakia,  right eye Z96.1   7. 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 Proliferative diabetic retinopathy w/o macular edema, both eyes - S/p PRP OD (01.17.19), fill-in (02.28.19) - S/p PRP OS (01.31.19), fill-in (04.04.19), fill-in (05.16.19) - fibrosis overlying disc OD with fine NVD -- regressed - large preretinal hemorrhage inferiorly ?ruptured RAM? -- persistent but improving - OCT stable without  diabetic macular edema, both eyes - repeat FA today shows focal areas of persistent NV / leakage - recommend PRP fill in OU -- OS today (8.6.19) - f/u in 4-6 wks - possible fill in PRP OS around macroaneurysm  4,5. Hypertensive retinopathy OU - discussed importance of tight BP control - monitor  6. Pseudophakia OD  - s/p CE/IOL OD -- pt unable to recall surgeon  - stable, doing well  - monitor  7. Nuclear and cortical cataract OS - The symptoms of cataract, surgical options, and treatments and risks were discussed with patient. - discussed diagnosis and progression - approaching visual significance - monitor for now   Ophthalmic Meds Ordered this visit:  No orders of the defined types were placed in this encounter.      Return in about 1 month (around 12/26/2017) for PRP fill in OD.  There are no Patient Instructions on file for this visit.   Explained the diagnoses, plan, and follow up with the patient and they expressed understanding.  Patient expressed understanding of the importance of proper follow up care.   This document serves as a record of services personally performed by Karie Chimera, MD, PhD. It was created on their behalf by Virgilio Belling, COA, a certified ophthalmic assistant. The creation of this record is the provider's dictation and/or activities during the visit.  Electronically signed by: Virgilio Belling, COA  08.05.19 12:42 PM    Karie Chimera, M.D., Ph.D. Diseases & Surgery of the Retina and Vitreous [facility]  I have reviewed the above documentation for accuracy and completeness, and I agree with the above. Karie Chimera, M.D., Ph.D. 11/28/17 12:42 PM    Abbreviations: M myopia (nearsighted); A astigmatism; H hyperopia (farsighted); P presbyopia; Mrx spectacle prescription;  CTL contact lenses; OD right eye; OS left eye; OU both eyes  XT exotropia; ET esotropia; PEK punctate epithelial keratitis; PEE punctate  epithelial erosions; DES dry eye syndrome; MGD meibomian gland dysfunction; ATs artificial tears; PFAT's preservative free artificial tears; NSC nuclear sclerotic cataract; PSC posterior subcapsular cataract; ERM epi-retinal membrane; PVD posterior vitreous detachment; RD retinal detachment; DM diabetes mellitus; DR diabetic retinopathy; NPDR non-proliferative diabetic retinopathy; PDR proliferative diabetic retinopathy; CSME clinically significant macular edema; DME diabetic macular edema; dbh dot blot hemorrhages; CWS cotton wool spot; POAG primary open angle glaucoma; C/D cup-to-disc ratio; HVF humphrey visual field; GVF goldmann visual field; OCT optical coherence tomography; IOP intraocular pressure; BRVO Branch retinal vein occlusion; CRVO central retinal vein occlusion; CRAO central retinal artery occlusion; BRAO branch retinal artery occlusion; RT retinal tear; SB scleral buckle; PPV pars plana vitrectomy; VH Vitreous hemorrhage; PRP panretinal laser photocoagulation; IVK intravitreal kenalog; VMT vitreomacular traction; MH Macular hole;  NVD neovascularization of the disc; NVE neovascularization elsewhere; AREDS age related eye disease study; ARMD age related macular degeneration; POAG primary open angle glaucoma; EBMD epithelial/anterior basement membrane dystrophy; ACIOL anterior chamber intraocular lens; IOL intraocular lens; PCIOL posterior chamber intraocular lens; Phaco/IOL phacoemulsification with intraocular lens placement; PRK photorefractive keratectomy; LASIK laser assisted in situ keratomileusis; HTN hypertension; DM diabetes  mellitus; COPD chronic obstructive pulmonary disease

## 2017-11-28 ENCOUNTER — Encounter (INDEPENDENT_AMBULATORY_CARE_PROVIDER_SITE_OTHER): Payer: Self-pay | Admitting: Ophthalmology

## 2017-11-28 ENCOUNTER — Ambulatory Visit (INDEPENDENT_AMBULATORY_CARE_PROVIDER_SITE_OTHER): Payer: Medicare Other | Admitting: Ophthalmology

## 2017-11-28 DIAGNOSIS — I10 Essential (primary) hypertension: Secondary | ICD-10-CM

## 2017-11-28 DIAGNOSIS — E11319 Type 2 diabetes mellitus with unspecified diabetic retinopathy without macular edema: Secondary | ICD-10-CM

## 2017-11-28 DIAGNOSIS — E113593 Type 2 diabetes mellitus with proliferative diabetic retinopathy without macular edema, bilateral: Secondary | ICD-10-CM

## 2017-11-28 DIAGNOSIS — H35049 Retinal micro-aneurysms, unspecified, unspecified eye: Secondary | ICD-10-CM

## 2017-11-28 DIAGNOSIS — H25812 Combined forms of age-related cataract, left eye: Secondary | ICD-10-CM

## 2017-11-28 DIAGNOSIS — H3581 Retinal edema: Secondary | ICD-10-CM | POA: Diagnosis not present

## 2017-11-28 DIAGNOSIS — H35033 Hypertensive retinopathy, bilateral: Secondary | ICD-10-CM

## 2017-11-28 DIAGNOSIS — Z961 Presence of intraocular lens: Secondary | ICD-10-CM

## 2017-12-26 ENCOUNTER — Encounter (INDEPENDENT_AMBULATORY_CARE_PROVIDER_SITE_OTHER): Payer: Medicare Other | Admitting: Ophthalmology

## 2018-01-02 ENCOUNTER — Encounter (INDEPENDENT_AMBULATORY_CARE_PROVIDER_SITE_OTHER): Payer: Medicare Other | Admitting: Ophthalmology

## 2018-01-30 ENCOUNTER — Non-Acute Institutional Stay: Payer: Medicare Other | Admitting: Primary Care

## 2018-01-30 DIAGNOSIS — M24541 Contracture, right hand: Secondary | ICD-10-CM

## 2018-01-30 DIAGNOSIS — Z515 Encounter for palliative care: Secondary | ICD-10-CM

## 2018-01-30 DIAGNOSIS — N186 End stage renal disease: Secondary | ICD-10-CM

## 2018-01-30 NOTE — Progress Notes (Signed)
PALLIATIVE CARE CONSULT VISIT   PATIENT NAME: [name] DOB: Oct 23, 1960 MRN: [id number]  PRIMARY CARE PROVIDER:   Terrial Rhodes, MD  REFERRING PROVIDER:  Terrial Rhodes, MD [address] [place], [place] [address]  RESPONSIBLE PARTY:   POA  ASSESSMENT:       Goals of care: Patient currently on HD for ESRD. She has a legal guardian per Epic chart. States she wants to pursue current POC.  Mobility/ADL function: Patient has bilateral LE amputations and left hand amputation. She uses a wheel chair for ambulation. Right hand has contracted fingers which cause her pain; third digit is completely immobile. Her biggest complaint is of hand pain. She notes she used to have a hand brace but lost it. She states upcoming appointment with a surgeon to examine release.  Pain: States pain in right hand from contractures. No other pain reported.   RECOMMENDATIONS and PLAN:  1. Goals of care: Reach out to legal guardian about current goals of care.     2.Mobility: no hand surgeon appointment noted in epic in referrals. Would recommend consultation with hand surgeon to consider release of contracture of right hand. OT referral if appropriate for a brace and hand tools as well.  3. Pain: treat with acetaminophen as tolerated, continue use of tramadol prn per medication list. See above re surgical consult.  Follow up in 1-2 months for continued discussion in goals of care.   I spent 25 minutes providing this consultation,  From 1330  to 1355. More than 50% of the time in this consultation was spent coordinating communication.   HISTORY OF PRESENT ILLNESS:  [name] is a 57 y.o. 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 Palliative Care was asked to help address goals of care.   CODE STATUS: DNR (per nursing home)  PPS: 40% HOSPICE ELIGIBILITY/DIAGNOSIS: TBD  PAST MEDICAL  HISTORY:  Past Medical History:  Diagnosis Date  . Anemia   . Blood transfusion   . Chronic back pain   . Dementia   . Diabetes mellitus   . Hemodialysis patient (HCC) 03/15/11   "Tues; Thurs; Sat; [facility]"  . Hypertension   . PEA (Pulseless electrical activity) (HCC)   . Peripheral vascular disease (HCC)   . Rash 03/15/11   "admitted me to find out what its' from; UE, waist, thighs, groin"  . Renal failure    hd 4/12  . Stroke [facility])     SOCIAL HX:  Social History   Tobacco Use  . Smoking status: Never Smoker  . Smokeless tobacco: Never Used  Substance Use Topics  . Alcohol use: No    ALLERGIES:  Allergies  Allergen Reactions  . Codeine Hives  . Levofloxacin Other (See Comments)    Unknown, but noted on MAR  . Penicillins Hives    Has patient had a PCN reaction causing immediate rash, facial/tongue/throat swelling, SOB or lightheadedness with hypotension: Yes Has patient had a PCN reaction causing severe rash involving mucus membranes or skin necrosis: Unk Has patient had a PCN reaction that required hospitalization: Unk Has patient had a PCN reaction occurring within the last 10 years: Unk If all of the above answers are "NO", then may proceed with Cephalosporin use.      PERTINENT MEDICATIONS:  Outpatient Encounter Medications as of 01/30/2018  Medication Sig  . aspirin 81 MG chewable tablet  Place 1 tablet (81 mg total) into feeding tube daily. (Patient taking differently: Chew 81 mg by mouth daily. )  . atorvastatin (LIPITOR) 10 MG tablet Take 10 mg by mouth daily.  . Cholecalciferol (VITAMIN D-3) 5000 units TABS Take 5,000 Units by mouth daily.  . cinacalcet (SENSIPAR) 30 MG tablet Take 30 mg by mouth at bedtime.  . docusate sodium (COLACE) 50 MG capsule Take 50 mg by mouth every evening.  . gabapentin (NEURONTIN) 100 MG capsule Take 100 mg by mouth at bedtime.  Marland Kitchen glucagon (GLUCAGON EMERGENCY) 1 MG injection Inject 1 mg into the vein once  as needed (for blood sugar).  Marland Kitchen glycerin adult 2 g suppository Place 1 suppository rectally as needed for constipation.  Marland Kitchen guaiFENesin (ROBITUSSIN) 100 MG/5ML SOLN Take 20 mLs by mouth every 8 (eight) hours as needed for cough or to loosen phlegm.  Marland Kitchen HYDROcodone-acetaminophen (NORCO/VICODIN) 5-325 MG tablet Take 1-2 tablets by mouth every 6 (six) hours as needed.  . hydrocortisone cream 1 % Apply 1 application topically every 8 (eight) hours as needed for itching.  . Infant Care Products (DERMACLOUD) CREA Apply topically 3 (three) times daily. To buttocks  . insulin aspart (NOVOLOG) 100 UNIT/ML injection Inject 2-10 Units into the skin 3 (three) times daily before meals. Per sliding scale 0-59: notify doctor 60-100= 0 units 101-199 = 2 units 200-299 = 6 units 300-349 = 8 units 350-399 = 10 units 400-100 = Notify Doctor  . lamoTRIgine (LAMICTAL) 25 MG tablet Take 25 mg by mouth at bedtime.  Marland Kitchen levothyroxine (SYNTHROID, LEVOTHROID) 25 MCG tablet Place 1 tablet (25 mcg total) into feeding tube daily before breakfast. (Patient taking differently: Take 25 mcg by mouth daily before breakfast. )  . Melatonin-Pyridoxine 3-1 MG TABS Take 1 tablet by mouth at bedtime.  . mirtazapine (REMERON) 30 MG tablet Take 30 mg by mouth at bedtime.  Marland Kitchen neomycin-bacitracin-polymyxin (NEOSPORIN) ointment Apply 1 application topically daily.  . nitrofurantoin, macrocrystal-monohydrate, (MACROBID) 100 MG capsule Take 1 capsule (100 mg total) by mouth 2 (two) times daily. X 7 days  . Nutritional Supplements (FEEDING SUPPLEMENT, NEPRO CARB STEADY,) LIQD Place 360 mLs into feeding tube 3 (three) times daily with meals.  . ondansetron (ZOFRAN) 4 MG tablet Take 4 mg by mouth every 8 (eight) hours as needed for nausea or vomiting.  . pantoprazole (PROTONIX) 40 MG tablet Take 40 mg by mouth every evening.   . polyethylene glycol (MIRALAX) packet Take 17 g by mouth daily.  . prednisoLONE acetate (PRED FORTE) 1 % ophthalmic  suspension Place 1 drop into the right eye 4 (four) times daily.  . predniSONE (DELTASONE) 50 MG tablet Take 1 tablet daily x 5 days.  . Probiotic Product (PROBIOTIC PO) Take 250 mg by mouth 2 (two) times daily.  . promethazine (PHENERGAN) 6.25 MG/5ML syrup Take 12.5 mg by mouth every 6 (six) hours as needed for nausea or vomiting.  Marland Kitchen Propylene Glycol-Glycerin (ARTIFICIAL TEARS) 1-0.3 % SOLN Place 1 drop into both eyes every 12 (twelve) hours as needed (for eye iritation).  Marland Kitchen senna-docusate (SENOKOT-S) 8.6-50 MG tablet Take 2 tablets by mouth at bedtime.   . sevelamer carbonate (RENVELA) 800 MG tablet Take 400 mg by mouth 3 (three) times daily with meals.  . sucralfate (CARAFATE) 1 g tablet Take 1 g by mouth 4 (four) times daily.  . traMADol (ULTRAM) 50 MG tablet Take 1 tablet (50 mg total) by mouth every 12 (twelve) hours as needed for moderate pain. (Patient taking  differently: Take 50 mg by mouth every 8 (eight) hours as needed for moderate pain. )  . triamcinolone (KENALOG) 0.025 % cream Apply 1 application topically 2 (two) times daily.  . Water For Irrigation, Sterile (FREE WATER) SOLN Place 100 mLs into feeding tube every 8 (eight) hours.  Weyman Croon Hazel (PREPARATION H TOTABLES WIPES) 50 % PADS Apply topically every 8 (eight) hours as needed (for hemmorrhoids).   No facility-administered encounter medications on file as of 01/30/2018.     PHYSICAL EXAM:   General: NAD, frail appearing, obese. Interactive. 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  Paulina Fusi, NP

## 2018-02-05 NOTE — Progress Notes (Signed)
[facility] - Clinic Note  02/06/2018     CHIEF COMPLAINT Patient presents for Retina Follow Up   HISTORY OF PRESENT ILLNESS: [name] is a 57 y.o. female who presents to the clinic today for:   HPI    Retina Follow Up    Patient presents with  Diabetic Retinopathy.  In both eyes.  This started 5.  Severity is mild.  Since onset it is stable.  I, the attending physician,  performed the HPI with the patient and updated documentation appropriately.          Comments    F/U PDR OU.S/P laser retinopexy OS 11/28/17. Patient states vision is good, but "I can't read fine print". Denies new visual onsets. BS "was'nt high" patient does recall results this am. Pt is ready for tx if indicated       Last edited by Rennis Chris, MD on 02/06/2018  9:04 AM. (History)       Referring physician: Terrial Rhodes, MD [address] [place], [place] [address]  HISTORICAL INFORMATION:   Selected notes from the MEDICAL RECORD NUMBER Referred by Dr. Hansel Feinstein (Nephrologist) for DM exam;  LEE-  Ocular Hx-  PMH- DM    CURRENT MEDICATIONS: Current Outpatient Medications (Ophthalmic Drugs)  Medication Sig  . prednisoLONE acetate (PRED FORTE) 1 % ophthalmic suspension Place 1 drop into the right eye 4 (four) times daily.  Marland Kitchen Propylene Glycol-Glycerin (ARTIFICIAL TEARS) 1-0.3 % SOLN Place 1 drop into both eyes every 12 (twelve) hours as needed (for eye iritation).   No current facility-administered medications for this visit.  (Ophthalmic Drugs)   Current Outpatient Medications (Other)  Medication Sig  . aspirin 81 MG chewable tablet Place 1 tablet (81 mg total) into feeding tube daily. (Patient taking differently: Chew 81 mg by mouth daily. )  . atorvastatin (LIPITOR) 10 MG tablet Take 10 mg by mouth daily.  . Cholecalciferol (VITAMIN D-3) 5000 units TABS Take 5,000 Units by mouth daily.  . cinacalcet (SENSIPAR) 30 MG tablet Take 30 mg by mouth at bedtime.  .  docusate sodium (COLACE) 50 MG capsule Take 50 mg by mouth every evening.  . gabapentin (NEURONTIN) 100 MG capsule Take 100 mg by mouth at bedtime.  Marland Kitchen glucagon (GLUCAGON EMERGENCY) 1 MG injection Inject 1 mg into the vein once as needed (for blood sugar).  Marland Kitchen glycerin adult 2 g suppository Place 1 suppository rectally as needed for constipation.  Marland Kitchen guaiFENesin (ROBITUSSIN) 100 MG/5ML SOLN Take 20 mLs by mouth every 8 (eight) hours as needed for cough or to loosen phlegm.  Marland Kitchen HYDROcodone-acetaminophen (NORCO/VICODIN) 5-325 MG tablet Take 1-2 tablets by mouth every 6 (six) hours as needed.  . hydrocortisone cream 1 % Apply 1 application topically every 8 (eight) hours as needed for itching.  . Infant Care Products (DERMACLOUD) CREA Apply topically 3 (three) times daily. To buttocks  . insulin aspart (NOVOLOG) 100 UNIT/ML injection Inject 2-10 Units into the skin 3 (three) times daily before meals. Per sliding scale 0-59: notify doctor 60-100= 0 units 101-199 = 2 units 200-299 = 6 units 300-349 = 8 units 350-399 = 10 units 400-100 = Notify Doctor  . lamoTRIgine (LAMICTAL) 25 MG tablet Take 25 mg by mouth at bedtime.  Marland Kitchen levothyroxine (SYNTHROID, LEVOTHROID) 25 MCG tablet Place 1 tablet (25 mcg total) into feeding tube daily before breakfast. (Patient taking differently: Take 25 mcg by mouth daily before breakfast. )  . Melatonin-Pyridoxine 3-1 MG TABS Take  1 tablet by mouth at bedtime.  . mirtazapine (REMERON) 30 MG tablet Take 30 mg by mouth at bedtime.  Marland Kitchen neomycin-bacitracin-polymyxin (NEOSPORIN) ointment Apply 1 application topically daily.  . nitrofurantoin, macrocrystal-monohydrate, (MACROBID) 100 MG capsule Take 1 capsule (100 mg total) by mouth 2 (two) times daily. X 7 days  . Nutritional Supplements (FEEDING SUPPLEMENT, NEPRO CARB STEADY,) LIQD Place 360 mLs into feeding tube 3 (three) times daily with meals.  . ondansetron (ZOFRAN) 4 MG tablet Take 4 mg by mouth every 8 (eight) hours as  needed for nausea or vomiting.  . pantoprazole (PROTONIX) 40 MG tablet Take 40 mg by mouth every evening.   . polyethylene glycol (MIRALAX) packet Take 17 g by mouth daily.  . predniSONE (DELTASONE) 50 MG tablet Take 1 tablet daily x 5 days.  . Probiotic Product (PROBIOTIC PO) Take 250 mg by mouth 2 (two) times daily.  . promethazine (PHENERGAN) 6.25 MG/5ML syrup Take 12.5 mg by mouth every 6 (six) hours as needed for nausea or vomiting.  . senna-docusate (SENOKOT-S) 8.6-50 MG tablet Take 2 tablets by mouth at bedtime.   . sevelamer carbonate (RENVELA) 800 MG tablet Take 400 mg by mouth 3 (three) times daily with meals.  . sucralfate (CARAFATE) 1 g tablet Take 1 g by mouth 4 (four) times daily.  . traMADol (ULTRAM) 50 MG tablet Take 1 tablet (50 mg total) by mouth every 12 (twelve) hours as needed for moderate pain. (Patient taking differently: Take 50 mg by mouth every 8 (eight) hours as needed for moderate pain. )  . triamcinolone (KENALOG) 0.025 % cream Apply 1 application topically 2 (two) times daily.  . Water For Irrigation, Sterile (FREE WATER) SOLN Place 100 mLs into feeding tube every 8 (eight) hours.  Weyman Croon Hazel (PREPARATION H TOTABLES WIPES) 50 % PADS Apply topically every 8 (eight) hours as needed (for hemmorrhoids).   No current facility-administered medications for this visit.  (Other)      REVIEW OF SYSTEMS: ROS    Positive for: Endocrine, Eyes   Negative for: Constitutional, Gastrointestinal, Neurological, Skin, Genitourinary, Musculoskeletal, HENT, Cardiovascular, Respiratory, Psychiatric, Allergic/Imm, Heme/Lymph   Last edited by Eldridge Scot, LPN on 09/81/1914  8:43 AM. (History)       ALLERGIES Allergies  Allergen Reactions  . Codeine Hives  . Levofloxacin Other (See Comments)    Unknown, but noted on MAR  . Penicillins Hives    Has patient had a PCN reaction causing immediate rash, facial/tongue/throat swelling, SOB or lightheadedness with hypotension:  Yes Has patient had a PCN reaction causing severe rash involving mucus membranes or skin necrosis: Unk Has patient had a PCN reaction that required hospitalization: Unk Has patient had a PCN reaction occurring within the last 10 years: Unk If all of the above answers are "NO", then may proceed with Cephalosporin use.     PAST MEDICAL HISTORY Past Medical History:  Diagnosis Date  . Anemia   . Blood transfusion   . Chronic back pain   . Dementia (HCC)   . Diabetes mellitus   . Hemodialysis patient (HCC) 03/15/11   "Tues; Thurs; Sat; [facility]"  . Hypertension   . PEA (Pulseless electrical activity) (HCC)   . Peripheral vascular disease (HCC)   . Rash 03/15/11   "admitted me to find out what its' from; UE, waist, thighs, groin"  . Renal failure    hd 4/12  . Stroke [facility])    Past Surgical History:  Procedure Laterality Date  .  AMPUTATION  03/04/2011   Procedure: AMPUTATION DIGIT;  Surgeon: [name], MD;  Location: [facility] OR;  Service: Orthopedics;  Laterality: Right;  RT GREAT TOE AMP  . AMPUTATION Right 03/13/2015   Procedure: RIGHT  ABOVE KNEE AMPUTATION;  Surgeon: Pryor Ochoa, MD;  Location: [facility] OR;  Service: Vascular;  Laterality: Right;  . ARTERIOVENOUS GRAFT PLACEMENT  10/01/10   left upper arm  . BELOW KNEE LEG AMPUTATION  2004   left  . CARPAL TUNNEL RELEASE  ~ 2009   left wrist  . CATARACT EXTRACTION W/ INTRAOCULAR LENS IMPLANT  12/2009   right eye  . HAND AMPUTATION Left   . INSERTION OF DIALYSIS CATHETER  07/29/10   right subclavian  . IR FLUORO RM 30-60 MIN  08/16/2016  . IR GASTROSTOMY TUBE MOD SED  08/23/2016  . IR GASTROSTOMY TUBE REMOVAL  02/07/2017  . TEE WITHOUT CARDIOVERSION  03/18/2011   Procedure: TRANSESOPHAGEAL ECHOCARDIOGRAM (TEE);  Surgeon: [name], MD;  Location: [facility] ENDOSCOPY;  Service: Cardiovascular;  Laterality: N/A;    FAMILY HISTORY Family History  Problem Relation Age of Onset  . Diabetes Mellitus II Mother   .  Stroke Mother     SOCIAL HISTORY Social History   Tobacco Use  . Smoking status: Never Smoker  . Smokeless tobacco: Never Used  Substance Use Topics  . Alcohol use: No  . Drug use: No         OPHTHALMIC EXAM:  Base Eye Exam    Visual Acuity (Snellen - Linear)      Right Left   Dist cc 20/40 -2 20/40 -1   Dist ph cc NI NI   Correction:  Glasses       Tonometry (Tonopen, 8:38 AM)      Right Left   Pressure 17 18       Pupils      Dark Light Shape React APD   Right 4 3 Round Brisk None   Left 4 3 Round Brisk None       Visual Fields (Counting fingers)      Left Right    Full Full       Extraocular Movement      Right Left    Full, Ortho Full, Ortho       Neuro/Psych    Oriented x3:  Yes   Mood/Affect:  Normal       Dilation    Both eyes:  1.0% Mydriacyl, 2.5% Phenylephrine @ 8:43 AM        Slit Lamp and Fundus Exam    External Exam      Right Left   External wheelchair bound; bilateral lower limb amputee wheelchair bound; bilateral lower limb amputee       Slit Lamp Exam      Right Left   Lids/Lashes Dermatochalasis - upper lid, Telangiectasia of upper lid, Meibomian gland dysfunction Dermatochalasis - upper lid, Telangiectasia of upper lid, Meibomian gland dysfunction   Conjunctiva/Sclera Nasal and temporal  Pinguecula Nasal and temproal Pinguecula   Cornea 1-2+ Punctate epithelial erosions 2+ Punctate epithelial erosions   Anterior Chamber Deep and quiet moderatre depth, Narrow angles   Iris Round and moderate dilated, No NVI Round and dilated, No NVI   Lens  PC IOL in good position, Mild anterior capsule fimosis 2+ Nuclear sclerosis, 3+ Cortical cataract, pigment at 0700 anterior capsule   Vitreous Vitreous syneresis Vitreous syneresis       Fundus Exam  Right Left   Disc peripapillary pigmentation temporally, fibrosis overlying vessels, NVD regressed, Peripapillary atrophy, Pallor Trace Pallor   C/D Ratio obscured by fibrosis 0.3    Macula Flat, Blunted foveal reflex, scattered Mas - improved; no edema, Epiretinal membrane Blunted foveal reflex, Flat, Retinal pigment epithelial mottling, No heme or edema   Vessels Vascular attenuation; peripheral sclerosis Vascular attenuation; peripheral sclerosis, mildly Tortuous   Periphery pre-retinal hemorrhage inferiorly at 0700 - improving/shrinking and now white, 360 scattered dot hemes, 360 PRP with good laser scars, mild room for fill in superonasal and around inf RAM attached; few Mas, 360 PRP with good scarring, room for fill in nasally          IMAGING AND PROCEDURES  Imaging and Procedures for 07/27/17  OCT, Retina - OU - Both Eyes       Right Eye Quality was borderline. Central Foveal Thickness: 247. Progression has been stable. Findings include normal foveal contour, no IRF, no SRF, epiretinal membrane (Diffuse atrophy temporally).   Left Eye Quality was borderline. Central Foveal Thickness: 262. Progression has been stable. Findings include normal foveal contour, epiretinal membrane, no IRF, no SRF (Diffuse atrophy temporally ).   Notes *Images captured and stored on drive  Diagnosis / Impression:  No DME OU -- stable from prior  Clinical management:  See below  Abbreviations: NFP - Normal foveal profile. CME - cystoid macular edema. PED - pigment epithelial detachment. IRF - intraretinal fluid. SRF - subretinal fluid. EZ - ellipsoid zone. ERM - epiretinal membrane. ORA - outer retinal atrophy. ORT - outer retinal tubulation. SRHM - subretinal hyper-reflective material         Panretinal Photocoagulation - OD - Right Eye       LASER PROCEDURE NOTE  Diagnosis:   Proliferative Diabetic Retinopathy, RIGHT EYE  Procedure:  Pan-retinal photocoagulation using slit lamp laser, RIGHT EYE, fill-in  Anesthesia:  Topical  Surgeon: Rennis Chris, MD, PhD   Informed consent obtained, operative eye marked, and time out performed prior to initiation of laser.    Lumenis [id number] slit lamp laser Pattern: 2x2 square, 3x3 square Power: 320 mW Duration: 50 msec  Spot size: 200 microns  # spots: 263 spots fill-in -- mostly nasal  Complications: None.  Notes: significant preretinal/vitreous heme obscuring view and preventing laser up take inferiorly  RTC: 2 mos  Patient tolerated the procedure well and received written and verbal post-procedure care information/education.                ASSESSMENT/PLAN:    ICD-10-CM   1. 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 Retinal edema H35.81 OCT, Retina - OU - Both Eyes  4. Essential hypertension I10   5. Hypertensive retinopathy of both eyes H35.033   6. Pseudophakia, right eye Z96.1   7. 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 Proliferative diabetic retinopathy w/o macular edema, both eyes - S/p PRP OD (01.17.19), fill-in (02.28.19) - S/p PRP OS (01.31.19), fill-in (04.04.19), fill-in (05.16.19), fill-in (08.06.19) - fibrosis overlying disc OD with fine NVD regressed - large preretinal hemorrhage inferiorly ?ruptured RAM? -- heme now white and clearing slowly - OCT stable without diabetic macular edema, both eyes - repeat FA (08.06.19) shows focal areas of persistent NV / leakage - recommend PRP fill in OD  today (10.15.19) - f/u in 2 months, DFE, OCT, FA (Optos, transit OD)  4,5. Hypertensive retinopathy OU - discussed importance of tight BP control - monitor  6. Pseudophakia OD  - s/p CE/IOL OD -- pt unable to recall surgeon  - stable, doing well  - monitor  7. Nuclear and cortical cataract OS - The symptoms of cataract, surgical options, and treatments and risks were discussed with patient. - discussed diagnosis and progression - approaching visual  significance - monitor for now   Ophthalmic Meds Ordered this visit:  No orders of the defined types were placed in this encounter.      Return in about 2 months (around 04/08/2018) for F/U PDR OU, DFE, OCT.  There are no Patient Instructions on file for this visit.   Explained the diagnoses, plan, and follow up with the patient and they expressed understanding.  Patient expressed understanding of the importance of proper follow up care.   This document serves as a record of services personally performed by Karie Chimera, MD, PhD. It was created on their behalf by Virgilio Belling, COA, a certified ophthalmic assistant. The creation of this record is the provider's dictation and/or activities during the visit.  Electronically signed by: Virgilio Belling, COA  10.14.19 1:12 PM   Karie Chimera, M.D., Ph.D. Diseases & Surgery of the Retina and Vitreous [facility]   I have reviewed the above documentation for accuracy and completeness, and I agree with the above. Karie Chimera, M.D., Ph.D. 02/06/18 1:12 PM   Abbreviations: M myopia (nearsighted); A astigmatism; H hyperopia (farsighted); P presbyopia; Mrx spectacle prescription;  CTL contact lenses; OD right eye; OS left eye; OU both eyes  XT exotropia; ET esotropia; PEK punctate epithelial keratitis; PEE punctate epithelial erosions; DES dry eye syndrome; MGD meibomian gland dysfunction; ATs artificial tears; PFAT's preservative free artificial tears; NSC nuclear sclerotic cataract; PSC posterior subcapsular cataract; ERM epi-retinal membrane; PVD posterior vitreous detachment; RD retinal detachment; DM diabetes mellitus; DR diabetic retinopathy; NPDR non-proliferative diabetic retinopathy; PDR proliferative diabetic retinopathy; CSME clinically significant macular edema; DME diabetic macular edema; dbh dot blot hemorrhages; CWS cotton wool spot; POAG primary open angle glaucoma; C/D cup-to-disc ratio; HVF  humphrey visual field; GVF goldmann visual field; OCT optical coherence tomography; IOP intraocular pressure; BRVO Branch retinal vein occlusion; CRVO central retinal vein occlusion; CRAO central retinal artery occlusion; BRAO branch retinal artery occlusion; RT retinal tear; SB scleral buckle; PPV pars plana vitrectomy; VH Vitreous hemorrhage; PRP panretinal laser photocoagulation; IVK intravitreal kenalog; VMT vitreomacular traction; MH Macular hole;  NVD neovascularization of the disc; NVE neovascularization elsewhere; AREDS age related eye disease study; ARMD age related macular degeneration; POAG primary open angle glaucoma; EBMD epithelial/anterior basement membrane dystrophy; ACIOL anterior chamber intraocular lens; IOL intraocular lens; PCIOL posterior chamber intraocular lens; Phaco/IOL phacoemulsification with intraocular lens placement; PRK photorefractive keratectomy; LASIK laser assisted in situ keratomileusis; HTN hypertension; DM diabetes mellitus; COPD chronic obstructive pulmonary disease

## 2018-02-06 ENCOUNTER — Encounter (INDEPENDENT_AMBULATORY_CARE_PROVIDER_SITE_OTHER): Payer: Self-pay | Admitting: Ophthalmology

## 2018-02-06 ENCOUNTER — Ambulatory Visit (INDEPENDENT_AMBULATORY_CARE_PROVIDER_SITE_OTHER): Payer: Medicare Other | Admitting: Ophthalmology

## 2018-02-06 DIAGNOSIS — H35049 Retinal micro-aneurysms, unspecified, unspecified eye: Secondary | ICD-10-CM

## 2018-02-06 DIAGNOSIS — E113593 Type 2 diabetes mellitus with proliferative diabetic retinopathy without macular edema, bilateral: Secondary | ICD-10-CM | POA: Diagnosis not present

## 2018-02-06 DIAGNOSIS — E11319 Type 2 diabetes mellitus with unspecified diabetic retinopathy without macular edema: Secondary | ICD-10-CM

## 2018-02-06 DIAGNOSIS — I10 Essential (primary) hypertension: Secondary | ICD-10-CM

## 2018-02-06 DIAGNOSIS — H3581 Retinal edema: Secondary | ICD-10-CM

## 2018-02-06 DIAGNOSIS — H35033 Hypertensive retinopathy, bilateral: Secondary | ICD-10-CM

## 2018-02-06 DIAGNOSIS — H25812 Combined forms of age-related cataract, left eye: Secondary | ICD-10-CM

## 2018-02-06 DIAGNOSIS — Z961 Presence of intraocular lens: Secondary | ICD-10-CM

## 2018-02-14 ENCOUNTER — Encounter (HOSPITAL_COMMUNITY): Payer: Self-pay | Admitting: Emergency Medicine

## 2018-02-14 ENCOUNTER — Emergency Department (HOSPITAL_COMMUNITY): Payer: Medicare Other

## 2018-02-14 ENCOUNTER — Observation Stay (HOSPITAL_COMMUNITY)
Admission: EM | Admit: 2018-02-14 | Discharge: 2018-02-15 | Disposition: A | Discharge status: Skilled Nursing Facility | Payer: Medicare Other | Attending: Internal Medicine | Admitting: Internal Medicine

## 2018-02-14 ENCOUNTER — Other Ambulatory Visit: Payer: Self-pay

## 2018-02-14 DIAGNOSIS — Z7982 Long term (current) use of aspirin: Secondary | ICD-10-CM | POA: Insufficient documentation

## 2018-02-14 DIAGNOSIS — Z8674 Personal history of sudden cardiac arrest: Secondary | ICD-10-CM | POA: Diagnosis not present

## 2018-02-14 DIAGNOSIS — L03011 Cellulitis of right finger: Principal | ICD-10-CM | POA: Insufficient documentation

## 2018-02-14 DIAGNOSIS — M549 Dorsalgia, unspecified: Secondary | ICD-10-CM | POA: Diagnosis not present

## 2018-02-14 DIAGNOSIS — F0390 Unspecified dementia without behavioral disturbance: Secondary | ICD-10-CM | POA: Insufficient documentation

## 2018-02-14 DIAGNOSIS — G894 Chronic pain syndrome: Secondary | ICD-10-CM | POA: Diagnosis not present

## 2018-02-14 DIAGNOSIS — Z794 Long term (current) use of insulin: Secondary | ICD-10-CM | POA: Insufficient documentation

## 2018-02-14 DIAGNOSIS — Z89512 Acquired absence of left leg below knee: Secondary | ICD-10-CM | POA: Insufficient documentation

## 2018-02-14 DIAGNOSIS — Z89611 Acquired absence of right leg above knee: Secondary | ICD-10-CM | POA: Insufficient documentation

## 2018-02-14 DIAGNOSIS — Z66 Do not resuscitate: Secondary | ICD-10-CM | POA: Insufficient documentation

## 2018-02-14 DIAGNOSIS — Z885 Allergy status to narcotic agent status: Secondary | ICD-10-CM | POA: Insufficient documentation

## 2018-02-14 DIAGNOSIS — E8889 Other specified metabolic disorders: Secondary | ICD-10-CM | POA: Insufficient documentation

## 2018-02-14 DIAGNOSIS — Z8673 Personal history of transient ischemic attack (TIA), and cerebral infarction without residual deficits: Secondary | ICD-10-CM | POA: Diagnosis not present

## 2018-02-14 DIAGNOSIS — L089 Local infection of the skin and subcutaneous tissue, unspecified: Secondary | ICD-10-CM | POA: Diagnosis present

## 2018-02-14 DIAGNOSIS — I120 Hypertensive chronic kidney disease with stage 5 chronic kidney disease or end stage renal disease: Secondary | ICD-10-CM | POA: Insufficient documentation

## 2018-02-14 DIAGNOSIS — Z881 Allergy status to other antibiotic agents status: Secondary | ICD-10-CM | POA: Diagnosis not present

## 2018-02-14 DIAGNOSIS — I10 Essential (primary) hypertension: Secondary | ICD-10-CM | POA: Diagnosis present

## 2018-02-14 DIAGNOSIS — F39 Unspecified mood [affective] disorder: Secondary | ICD-10-CM | POA: Insufficient documentation

## 2018-02-14 DIAGNOSIS — Z880 Allergy status to penicillin: Secondary | ICD-10-CM | POA: Insufficient documentation

## 2018-02-14 DIAGNOSIS — L02519 Cutaneous abscess of unspecified hand: Secondary | ICD-10-CM | POA: Diagnosis present

## 2018-02-14 DIAGNOSIS — N2581 Secondary hyperparathyroidism of renal origin: Secondary | ICD-10-CM | POA: Insufficient documentation

## 2018-02-14 DIAGNOSIS — Z79899 Other long term (current) drug therapy: Secondary | ICD-10-CM | POA: Diagnosis not present

## 2018-02-14 DIAGNOSIS — N186 End stage renal disease: Secondary | ICD-10-CM | POA: Insufficient documentation

## 2018-02-14 DIAGNOSIS — M869 Osteomyelitis, unspecified: Secondary | ICD-10-CM | POA: Diagnosis present

## 2018-02-14 DIAGNOSIS — E1122 Type 2 diabetes mellitus with diabetic chronic kidney disease: Secondary | ICD-10-CM | POA: Insufficient documentation

## 2018-02-14 DIAGNOSIS — Z992 Dependence on renal dialysis: Secondary | ICD-10-CM | POA: Diagnosis not present

## 2018-02-14 DIAGNOSIS — D631 Anemia in chronic kidney disease: Secondary | ICD-10-CM | POA: Diagnosis not present

## 2018-02-14 DIAGNOSIS — E1152 Type 2 diabetes mellitus with diabetic peripheral angiopathy with gangrene: Secondary | ICD-10-CM | POA: Diagnosis not present

## 2018-02-14 DIAGNOSIS — L03119 Cellulitis of unspecified part of limb: Secondary | ICD-10-CM

## 2018-02-14 DIAGNOSIS — E1129 Type 2 diabetes mellitus with other diabetic kidney complication: Secondary | ICD-10-CM | POA: Diagnosis present

## 2018-02-14 DIAGNOSIS — D649 Anemia, unspecified: Secondary | ICD-10-CM | POA: Diagnosis present

## 2018-02-14 HISTORY — DX: Unspecified infectious disease: B99.9

## 2018-02-14 LAB — COMPREHENSIVE METABOLIC PANEL
ALBUMIN: 4.1 g/dL (ref 3.5–5.0)
ALT: 12 U/L (ref 0–44)
AST: 18 U/L (ref 15–41)
Alkaline Phosphatase: 168 U/L — ABNORMAL HIGH (ref 38–126)
Anion gap: 15 (ref 5–15)
BUN: 11 mg/dL (ref 6–20)
CHLORIDE: 94 mmol/L — AB (ref 98–111)
CO2: 29 mmol/L (ref 22–32)
Calcium: 9.7 mg/dL (ref 8.9–10.3)
Creatinine, Ser: 3.2 mg/dL — ABNORMAL HIGH (ref 0.44–1.00)
GFR calc Af Amer: 18 mL/min — ABNORMAL LOW (ref >60)
GFR, EST NON AFRICAN AMERICAN: 15 mL/min — AB (ref >60)
Glucose, Bld: 114 mg/dL — ABNORMAL HIGH (ref 70–99)
POTASSIUM: 3.3 mmol/L — AB (ref 3.5–5.1)
Sodium: 138 mmol/L (ref 135–145)
Total Bilirubin: 1.1 mg/dL (ref 0.3–1.2)
Total Protein: 8.1 g/dL (ref 6.5–8.1)

## 2018-02-14 LAB — CBC WITH DIFFERENTIAL/PLATELET
ABS IMMATURE GRANULOCYTES: 0.01 10*3/uL (ref 0.00–0.07)
BASOS PCT: 1 %
Basophils Absolute: 0 10*3/uL (ref 0.0–0.1)
EOS ABS: 0.2 10*3/uL (ref 0.0–0.5)
Eosinophils Relative: 4 %
HCT: 42.7 % (ref 36.0–46.0)
Hemoglobin: 13.5 g/dL (ref 12.0–15.0)
IMMATURE GRANULOCYTES: 0 %
LYMPHS ABS: 0.7 10*3/uL (ref 0.7–4.0)
Lymphocytes Relative: 18 %
MCH: 29.9 pg (ref 26.0–34.0)
MCHC: 31.6 g/dL (ref 30.0–36.0)
MCV: 94.7 fL (ref 80.0–100.0)
MONOS PCT: 9 %
Monocytes Absolute: 0.4 10*3/uL (ref 0.1–1.0)
NEUTROS ABS: 2.9 10*3/uL (ref 1.7–7.7)
NEUTROS PCT: 68 %
PLATELETS: 97 10*3/uL — AB (ref 150–400)
RBC: 4.51 MIL/uL (ref 3.87–5.11)
RDW: 14.6 % (ref 11.5–15.5)
WBC: 4.2 10*3/uL (ref 4.0–10.5)
nRBC: 0 % (ref 0.0–0.2)

## 2018-02-14 LAB — GLUCOSE, CAPILLARY: Glucose-Capillary: 98 mg/dL (ref 70–99)

## 2018-02-14 LAB — I-STAT CG4 LACTIC ACID, ED: Lactic Acid, Venous: 1.33 mmol/L (ref 0.5–1.9)

## 2018-02-14 MED ORDER — ACETAMINOPHEN 650 MG RE SUPP: 650.0000 mg | Freq: Four times a day (QID) | RECTAL | Status: DC | PRN

## 2018-02-14 MED ORDER — HYDROCODONE-ACETAMINOPHEN 5-325 MG PO TABS
1.0000 | ORAL_TABLET | Freq: Four times a day (QID) | ORAL | Status: DC | PRN
  Administered 2018-02-15 (×2): 1 via ORAL
  Filled 2018-02-14 (×2): qty 1

## 2018-02-14 MED ORDER — ACETAMINOPHEN 325 MG PO TABS: 650.0000 mg | ORAL_TABLET | Freq: Four times a day (QID) | ORAL | Status: DC | PRN

## 2018-02-14 MED ORDER — VANCOMYCIN HCL 10 G IV SOLR: 2000.0000 mg | Freq: Once | INTRAVENOUS | Status: DC

## 2018-02-14 MED ORDER — ASPIRIN 81 MG PO CHEW
81.0000 mg | CHEWABLE_TABLET | Freq: Every day | ORAL | Status: DC
  Administered 2018-02-15: 81 mg via ORAL
  Filled 2018-02-14: qty 1

## 2018-02-14 MED ORDER — HEPARIN SODIUM (PORCINE) 5000 UNIT/ML IJ SOLN
5000.0000 [IU] | Freq: Three times a day (TID) | INTRAMUSCULAR | Status: DC
  Administered 2018-02-14 – 2018-02-15 (×4): 5000 [IU] via SUBCUTANEOUS
  Filled 2018-02-14 (×4): qty 1

## 2018-02-14 MED ORDER — OXYCODONE HCL 5 MG PO TABS
10.0000 mg | ORAL_TABLET | Freq: Two times a day (BID) | ORAL | Status: DC
  Administered 2018-02-14 – 2018-02-15 (×3): 10 mg via ORAL
  Filled 2018-02-14 (×3): qty 2

## 2018-02-14 MED ORDER — SODIUM CHLORIDE 0.9 % IV SOLN: 2.0000 g | Freq: Once | INTRAVENOUS | Status: DC

## 2018-02-14 MED ORDER — PANTOPRAZOLE SODIUM 40 MG PO TBEC
40.0000 mg | DELAYED_RELEASE_TABLET | Freq: Every day | ORAL | Status: DC
  Administered 2018-02-15: 40 mg via ORAL
  Filled 2018-02-14: qty 1

## 2018-02-14 MED ORDER — INSULIN ASPART 100 UNIT/ML ~~LOC~~ SOLN: 0.0000 [IU] | Freq: Three times a day (TID) | SUBCUTANEOUS | Status: DC

## 2018-02-14 MED ORDER — ONDANSETRON HCL 4 MG/2ML IJ SOLN: 4.0000 mg | Freq: Four times a day (QID) | INTRAMUSCULAR | Status: DC | PRN

## 2018-02-14 MED ORDER — METRONIDAZOLE IN NACL 5-0.79 MG/ML-% IV SOLN: 500.0000 mg | Freq: Three times a day (TID) | INTRAVENOUS | Status: DC

## 2018-02-14 MED ORDER — ATORVASTATIN CALCIUM 10 MG PO TABS
10.0000 mg | ORAL_TABLET | Freq: Every day | ORAL | Status: DC
  Administered 2018-02-15: 10 mg via ORAL
  Filled 2018-02-14: qty 1

## 2018-02-14 MED ORDER — LEVOTHYROXINE SODIUM 25 MCG PO TABS
25.0000 ug | ORAL_TABLET | Freq: Every day | ORAL | Status: DC
  Administered 2018-02-15: 25 ug via ORAL
  Filled 2018-02-14: qty 1

## 2018-02-14 MED ORDER — DULOXETINE HCL 60 MG PO CPEP
60.0000 mg | ORAL_CAPSULE | Freq: Every day | ORAL | Status: DC
  Administered 2018-02-15: 60 mg via ORAL
  Filled 2018-02-14: qty 1

## 2018-02-14 MED ORDER — ONDANSETRON HCL 4 MG PO TABS: 4.0000 mg | ORAL_TABLET | Freq: Four times a day (QID) | ORAL | Status: DC | PRN

## 2018-02-14 MED ORDER — LAMOTRIGINE 25 MG PO TABS
12.5000 mg | ORAL_TABLET | Freq: Every day | ORAL | Status: DC
  Administered 2018-02-15 (×2): 12.5 mg via ORAL
  Filled 2018-02-14 (×4): qty 0.5

## 2018-02-14 MED ORDER — GABAPENTIN 100 MG PO CAPS
100.0000 mg | ORAL_CAPSULE | Freq: Three times a day (TID) | ORAL | Status: DC
  Administered 2018-02-15 (×4): 100 mg via ORAL
  Filled 2018-02-14 (×4): qty 1

## 2018-02-14 MED ORDER — FENTANYL CITRATE (PF) 100 MCG/2ML IJ SOLN
50.0000 ug | Freq: Once | INTRAMUSCULAR | Status: AC
  Administered 2018-02-14: 50 ug via INTRAVENOUS
  Filled 2018-02-14: qty 2

## 2018-02-14 MED ORDER — VANCOMYCIN HCL 10 G IV SOLR
1500.0000 mg | Freq: Once | INTRAVENOUS | Status: AC
  Administered 2018-02-14: 1500 mg via INTRAVENOUS
  Filled 2018-02-14: qty 1500

## 2018-02-14 MED ORDER — DOCUSATE SODIUM 100 MG PO CAPS
100.0000 mg | ORAL_CAPSULE | Freq: Two times a day (BID) | ORAL | Status: DC
  Administered 2018-02-14 – 2018-02-15 (×3): 100 mg via ORAL
  Filled 2018-02-14 (×3): qty 1

## 2018-02-14 MED ORDER — INSULIN ASPART 100 UNIT/ML ~~LOC~~ SOLN: 0.0000 [IU] | Freq: Every day | SUBCUTANEOUS | Status: DC

## 2018-02-14 MED ORDER — VANCOMYCIN HCL IN DEXTROSE 750-5 MG/150ML-% IV SOLN: 750.0000 mg | INTRAVENOUS | Status: DC

## 2018-02-14 NOTE — Progress Notes (Signed)
Pharmacy Antibiotic Note  [name] is a 57 y.o. female admitted on 02/14/2018 with Cellulitis.  Pharmacy has been consulted for Vancomycin dosing. 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  PCN allergy w/ hives noted from 2012, but pt has tolerated multiple cephalosporins in the past.   Plan: -Vancomycin 1500mg  x1, then Vancomycin 750mg  qHD -Follow-up pt weight; pt states she has no idea of current weight; there are no recent weights in chart -Continue to monitor and adjust per renal plans, clinical status, C&S, vanc troughs as needed     Temp (24hrs), Avg:97.7 F (36.5 C), Min:97.7 F (36.5 C), Max:97.7 F (36.5 C)  Recent Labs  Lab 02/14/18 1631 02/14/18 1638  WBC 4.2  --   CREATININE 3.20*  --   LATICACIDVEN  --  1.33    CrCl cannot be calculated (Unknown ideal weight.).    Allergies  Allergen Reactions  . Codeine Hives  . Levofloxacin Other (See Comments)    Unknown, but noted on MAR  . Penicillins Hives    Has patient had a PCN reaction causing immediate rash, facial/tongue/throat swelling, SOB or lightheadedness with hypotension: Yes Has patient had a PCN reaction causing severe rash involving mucus membranes or skin necrosis: Unk Has patient had a PCN reaction that required hospitalization: Unk Has patient had a PCN reaction occurring within the last 10 years: Unk If all of the above answers are "NO", then may proceed with Cephalosporin use.     Antimicrobials this admission: Vancomycin 10/23 >>    Dose adjustments this admission: N/A  Microbiology results: Pending  Thank you for allowing pharmacy to be a part of this patient's care.  Koleen Nimrod 02/14/2018 5:51 PM

## 2018-02-14 NOTE — ED Notes (Signed)
Pt transported to xray 

## 2018-02-14 NOTE — ED Provider Notes (Signed)
[facility] EMERGENCY DEPARTMENT Provider Note   CSN: [id number] Arrival date & time: 02/14/18  1555     History   Chief Complaint Chief Complaint  Patient presents with  . Wound Infection    HPI [name] is a 57 y.o. female.  The history is provided by the patient and medical records. No language interpreter was used.  Hand Pain  This is a new problem. The current episode started more than 1 week ago. The problem occurs constantly. The problem has been rapidly worsening. Pertinent negatives include no chest pain, no abdominal pain, no headaches and no shortness of breath. Nothing aggravates the symptoms. Nothing relieves the symptoms. She has tried nothing for the symptoms. The treatment provided no relief.    Past Medical History:  Diagnosis Date  . Anemia   . Blood transfusion   . Chronic back pain   . Dementia (HCC)   . Diabetes mellitus   . Hemodialysis patient (HCC) 03/15/11   "Tues; Thurs; Sat; [facility]"  . Hypertension   . PEA (Pulseless electrical activity) (HCC)   . Peripheral vascular disease (HCC)   . Rash 03/15/11   "admitted me to find out what its' from; UE, waist, thighs, groin"  . Renal failure    hd 4/12  . Stroke [facility])     Patient Active Problem List   Diagnosis Date Noted  . Hypothermia   . Abnormal EKG   . Elevated troponin   . Hypoglycemia due to type 2 diabetes mellitus (HCC) 06/11/2015  . Pulmonary edema 06/11/2015  . S/P AKA (above knee amputation) unilateral (HCC) 05/19/2015  . 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 Depression 03/13/2015  . Prolonged Q-T interval on ECG 03/13/2015  . Type 2 diabetes mellitus, controlled, with renal complications (HCC) 12/12/2014  . Unspecified local infection of skin and subcutaneous tissue   . Chronic anemia 12/10/2014  . Ulcer of lower limb, unspecified 01/22/2013  . Mechanical complication of other vascular  device, implant, and graft 05/01/2012  . Mental status, decreased 03/19/2012  . Seizure-like activity (HCC) 03/19/2012  . Thrombocytopenia (HCC) 01/24/2012  . Acute respiratory failure with hypoxia (HCC) 01/23/2012  . HTN (hypertension) 03/01/2011  . ESRD (end stage renal disease) on dialysis (HCC) 03/01/2011  . Obesities, morbid (HCC) 03/01/2011  . Osteomyelitis (HCC) 03/01/2011  . 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 Complication of vascular access for dialysis 03/01/2011    Past Surgical History:  Procedure Laterality Date  . AMPUTATION  03/04/2011   Procedure: AMPUTATION DIGIT;  Surgeon: [name], MD;  Location: [facility] OR;  Service: Orthopedics;  Laterality: Right;  RT GREAT TOE AMP  . AMPUTATION Right 03/13/2015   Procedure: RIGHT  ABOVE KNEE AMPUTATION;  Surgeon: Pryor Ochoa, MD;  Location: [facility] OR;  Service: Vascular;  Laterality: Right;  . ARTERIOVENOUS GRAFT PLACEMENT  10/01/10   left upper arm  . BELOW KNEE LEG AMPUTATION  2004   left  . CARPAL TUNNEL RELEASE  ~ 2009   left wrist  . CATARACT EXTRACTION W/ INTRAOCULAR LENS IMPLANT  12/2009   right eye  . HAND AMPUTATION Left   . INSERTION OF DIALYSIS CATHETER  07/29/10   right subclavian  . IR FLUORO RM 30-60 MIN  08/16/2016  . IR GASTROSTOMY TUBE MOD SED  08/23/2016  . IR GASTROSTOMY TUBE REMOVAL  02/07/2017  . TEE WITHOUT CARDIOVERSION  03/18/2011   Procedure: TRANSESOPHAGEAL ECHOCARDIOGRAM (TEE);  Surgeon: [name], MD;  Location: [facility] ENDOSCOPY;  Service: Cardiovascular;  Laterality: N/A;     OB History   None      Home Medications    Prior to Admission medications   Medication Sig Start Date End Date Taking? Authorizing Provider  aspirin 81 MG chewable tablet Place 1 tablet (81 mg total) into feeding tube daily. Patient taking differently: Chew 81 mg by mouth daily.  08/28/16   Leroy Sea, MD  atorvastatin (LIPITOR) 10 MG tablet Take 10 mg by mouth daily.    [provider]    Cholecalciferol (VITAMIN D-3) 5000 units TABS Take 5,000 Units by mouth daily.    [provider]  cinacalcet (SENSIPAR) 30 MG tablet Take 30 mg by mouth at bedtime.    [provider]  docusate sodium (COLACE) 50 MG capsule Take 50 mg by mouth every evening.    [provider]  gabapentin (NEURONTIN) 100 MG capsule Take 100 mg by mouth at bedtime.    [provider]  glucagon (GLUCAGON EMERGENCY) 1 MG injection Inject 1 mg into the vein once as needed (for blood sugar).    [provider]  glycerin adult 2 g suppository Place 1 suppository rectally as needed for constipation.    [provider]  guaiFENesin (ROBITUSSIN) 100 MG/5ML SOLN Take 20 mLs by mouth every 8 (eight) hours as needed for cough or to loosen phlegm.    [provider]  HYDROcodone-acetaminophen (NORCO/VICODIN) 5-325 MG tablet Take 1-2 tablets by mouth every 6 (six) hours as needed. 01/12/17   Roxy Horseman, PA-C  hydrocortisone cream 1 % Apply 1 application topically every 8 (eight) hours as needed for itching.    [provider]  Infant Care Products (DERMACLOUD) CREA Apply topically 3 (three) times daily. To buttocks    [provider]  insulin aspart (NOVOLOG) 100 UNIT/ML injection Inject 2-10 Units into the skin 3 (three) times daily before meals. Per sliding scale 0-59: notify doctor 60-100= 0 units 101-199 = 2 units 200-299 = 6 units 300-349 = 8 units 350-399 = 10 units 400-100 = Notify Doctor    [provider]  lamoTRIgine (LAMICTAL) 25 MG tablet Take 25 mg by mouth at bedtime.    [provider]  levothyroxine (SYNTHROID, LEVOTHROID) 25 MCG tablet Place 1 tablet (25 mcg total) into feeding tube daily before breakfast. Patient taking differently: Take 25 mcg by mouth daily before breakfast.  08/28/16   Leroy Sea, MD  Melatonin-Pyridoxine 3-1 MG TABS Take 1 tablet by mouth at bedtime.    [provider]   mirtazapine (REMERON) 30 MG tablet Take 30 mg by mouth at bedtime.    [provider]  neomycin-bacitracin-polymyxin (NEOSPORIN) ointment Apply 1 application topically daily.    [provider]  nitrofurantoin, macrocrystal-monohydrate, (MACROBID) 100 MG capsule Take 1 capsule (100 mg total) by mouth 2 (two) times daily. X 7 days 05/04/17   Palumbo, April, MD  Nutritional Supplements (FEEDING SUPPLEMENT, NEPRO CARB STEADY,) LIQD Place 360 mLs into feeding tube 3 (three) times daily with meals. 08/27/16   Leroy Sea, MD  ondansetron (ZOFRAN) 4 MG tablet Take 4 mg by mouth every 8 (eight) hours as needed for nausea or vomiting.    [provider]  pantoprazole (PROTONIX) 40 MG tablet Take 40 mg by mouth every evening.     [provider]  polyethylene glycol (MIRALAX) packet Take 17 g by mouth daily. 05/04/17   Palumbo,  April, MD  prednisoLONE acetate (PRED FORTE) 1 % ophthalmic suspension Place 1 drop into the right eye 4 (four) times daily. 05/11/17   Rennis Chris, MD  predniSONE (DELTASONE) 50 MG tablet Take 1 tablet daily x 5 days. 01/19/17   Adonis Huguenin, NP  Probiotic Product (PROBIOTIC PO) Take 250 mg by mouth 2 (two) times daily.    [provider]  promethazine (PHENERGAN) 6.25 MG/5ML syrup Take 12.5 mg by mouth every 6 (six) hours as needed for nausea or vomiting.    [provider]  Propylene Glycol-Glycerin (ARTIFICIAL TEARS) 1-0.3 % SOLN Place 1 drop into both eyes every 12 (twelve) hours as needed (for eye iritation).    [provider]  senna-docusate (SENOKOT-S) 8.6-50 MG tablet Take 2 tablets by mouth at bedtime.     [provider]  sevelamer carbonate (RENVELA) 800 MG tablet Take 400 mg by mouth 3 (three) times daily with meals.    [provider]  sucralfate (CARAFATE) 1 g tablet Take 1 g by mouth 4 (four) times daily.    [provider]  traMADol (ULTRAM) 50 MG tablet Take 1 tablet (50 mg  total) by mouth every 12 (twelve) hours as needed for moderate pain. Patient taking differently: Take 50 mg by mouth every 8 (eight) hours as needed for moderate pain.  06/15/15   Clydia Llano, MD  triamcinolone (KENALOG) 0.025 % cream Apply 1 application topically 2 (two) times daily.    [provider]  Water For Irrigation, Sterile (FREE WATER) SOLN Place 100 mLs into feeding tube every 8 (eight) hours. 08/27/16   Leroy Sea, MD  Witch Hazel (PREPARATION H TOTABLES WIPES) 50 % PADS Apply topically every 8 (eight) hours as needed (for hemmorrhoids).    [provider]    Family History Family History  Problem Relation Age of Onset  . Diabetes Mellitus II Mother   . Stroke Mother     Social History Social History   Tobacco Use  . Smoking status: Never Smoker  . Smokeless tobacco: Never Used  Substance Use Topics  . Alcohol use: No  . Drug use: No     Allergies   Codeine; Levofloxacin; and Penicillins   Review of Systems Review of Systems  Constitutional: Negative for chills, diaphoresis, fatigue and fever.  HENT: Negative for congestion.   Respiratory: Negative for cough, chest tightness, shortness of breath and wheezing.   Cardiovascular: Negative for chest pain and palpitations.  Gastrointestinal: Negative for abdominal pain, constipation, diarrhea, nausea and vomiting.  Genitourinary: Negative for flank pain.  Musculoskeletal: Negative for back pain, neck pain and neck stiffness.  Skin: Positive for color change and wound.  Neurological: Negative for light-headedness and headaches.  Psychiatric/Behavioral: Negative for agitation.  All other systems reviewed and are negative.    Physical Exam Updated Vital Signs BP (!) 156/81 (BP Location: Right Arm)   Pulse 86   Temp 97.7 F (36.5 C) (Oral)   Resp 18   SpO2 99%   Physical Exam  Constitutional: She appears well-developed and well-nourished. No distress.  HENT:  Head: Normocephalic  and atraumatic.  Mouth/Throat: Oropharynx is clear and moist.  Eyes: Conjunctivae are normal.  Neck: Neck supple.  Cardiovascular: Normal rate and regular rhythm.  No murmur heard. Pulmonary/Chest: Effort normal and breath sounds normal. No respiratory distress. She has no wheezes. She has no rales. She exhibits no tenderness.  Abdominal: Soft. There is no tenderness.  Musculoskeletal: She exhibits tenderness. She exhibits  no edema.       Right hand: She exhibits tenderness.       Hands: Patient has a palpable radial pulse on the right.  Neurological: She is alert. She exhibits abnormal muscle tone (contractures in R hand).  Skin: Skin is warm and dry. Capillary refill takes less than 2 seconds. She is not diaphoretic. There is erythema.  Psychiatric: She has a normal mood and affect.  Nursing note and vitals reviewed.            ED Treatments / Results  Labs (all labs ordered are listed, but only abnormal results are displayed) Labs Reviewed  CBC WITH DIFFERENTIAL/PLATELET - Abnormal; Notable for the following components:      Result Value   Platelets 97 (*)    All other components within normal limits  COMPREHENSIVE METABOLIC PANEL - Abnormal; Notable for the following components:   Potassium 3.3 (*)    Chloride 94 (*)    Glucose, Bld 114 (*)    Creatinine, Ser 3.20 (*)    Alkaline Phosphatase 168 (*)    GFR calc non Af Amer 15 (*)    GFR calc Af Amer 18 (*)    All other components within normal limits  CULTURE, BLOOD (ROUTINE X 2)  CULTURE, BLOOD (ROUTINE X 2)  CREATININE, SERUM  HIV ANTIBODY (ROUTINE TESTING W REFLEX)  COMPREHENSIVE METABOLIC PANEL  CBC  I-STAT CG4 LACTIC ACID, ED  I-STAT CG4 LACTIC ACID, ED    EKG None  Radiology Dg Hand Complete Right  Result Date: 02/14/2018 CLINICAL DATA:  57 year old female with a history of right hand wound EXAM: RIGHT HAND - COMPLETE 3+ VIEW COMPARISON:  None. FINDINGS: Osteopenia.  Healed amputation of the  third digit. No displaced fracture identified. No erosive changes. Contractures of the PIP and DIP is a without subluxation/dislocation. Soft tissue injury is suggested on the dorsal aspect of the fourth digit on the oblique image. Degenerative changes at the first carpometacarpal joint. Diffuse calcifications of the visualized arteries. IMPRESSION: No acute bony abnormality identified. Soft tissue injury on the dorsal aspect of the fourth digit on the oblique image. Vascular calcifications. Healed amputation of the third digit. Electronically Signed   By: Gilmer Mor D.O.   On: 02/14/2018 16:53    Procedures Procedures (including critical care time)  CRITICAL CARE Performed by: Canary Brim Murat Rideout Total critical care time: 35 minutes Critical care time was exclusive of separately billable procedures and treating other patients. Critical care was necessary to treat or prevent imminent or life-threatening deterioration. Critical care was time spent personally by me on the following activities: development of treatment plan with patient and/or surrogate as well as nursing, discussions with consultants, evaluation of patient's response to treatment, examination of patient, obtaining history from patient or surrogate, ordering and performing treatments and interventions, ordering and review of laboratory studies, ordering and review of radiographic studies, pulse oximetry and re-evaluation of patient's condition.   Medications Ordered in ED Medications  ceFEPIme (MAXIPIME) 2 g in sodium chloride 0.9 % 100 mL IVPB (has no administration in time range)  vancomycin (VANCOCIN) 1,500 mg in sodium chloride 0.9 % 500 mL IVPB (has no administration in time range)  heparin injection 5,000 Units (has no administration in time range)  acetaminophen (TYLENOL) tablet 650 mg (has no administration in time range)    Or  acetaminophen (TYLENOL) suppository 650 mg (has no administration in time range)  docusate  sodium (COLACE) capsule 100 mg (has no administration in  time range)  ondansetron (ZOFRAN) tablet 4 mg (has no administration in time range)    Or  ondansetron (ZOFRAN) injection 4 mg (has no administration in time range)  fentaNYL (SUBLIMAZE) injection 50 mcg (50 mcg Intravenous Given 02/14/18 1659)     Initial Impression / Assessment and Plan / ED Course  I have reviewed the triage vital signs and the nursing notes.  Pertinent labs & imaging results that were available during my care of the patient were reviewed by me and considered in my medical decision making (see chart for details).     SHERISSA TENENBAUM is a 57 y.o. female with a past medical history significant for ESRD on dialysis, prior stroke with right hand contractures, hypertension, diabetes, and multiple amputations due to vascular disease and osteomyelitis who presents from her dialysis center for finger infection spreading up her hand.  Patient reports that several months ago she scraped her right 4th finger on the wheel of her wheelchair.  She reports that she was keeping it bandaged but over the last 2 days it has gotten more painful and erythematous.  She reports the redness is spread of her hand and the pain is worsened.  She also reports some purulent drainage coming from the hand.  She says that when she was seen today at her dialysis center, they sent her to the ED for evaluation and work-up of possible infection.  Patient reports the pain is severe.  She reports that over the last day the redness has spread towards her knuckle.  She denies fevers, chills, nausea, vomiting, chest pain, shortness of breath, or any bowel symptoms.   She says that she has lost both legs, her left hand, and several digits on her right hand to vascular disease and bone infections in the past.  She is worried she might lose another finger.  On exam, patient has an wound with some purulence and foul smell on her right 4th finger.  There is skin  breakdown.  Patient has contractures of her fingers which she reports is unchanged.  She has erythema that has progressed from the wound of her finger to to knuckle.  Patient also has a mild erythema in the dorsum of the hand.  Patient has a palpable radial pulse on the right arm.  Patient's left hand is a stump.  Lungs clear and chest is nontender.  Abdomen nontender.  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  Patient will have laboratory testing and x-ray obtained.  Anticipate IV antibiotics and admission for treatment.       Final Clinical Impressions(s) / ED Diagnoses   Final diagnoses:  Cellulitis of finger of right hand    ED Discharge Orders    None      Clinical Impression: 1. Cellulitis of finger of right hand     Disposition: Admit  This note was prepared with assistance of Dragon voice recognition software. Occasional wrong-word or sound-a-like substitutions may have occurred due to the inherent limitations of voice recognition software.     Mysha Peeler, Canary Brim, MD 02/14/18 [phone]

## 2018-02-14 NOTE — ED Triage Notes (Signed)
Pt BIB [facility] from [facility], c/o wounds to right hand with increased pain. Pt afebrile. Full dialysis treatment completed today.

## 2018-02-14 NOTE — H&P (Signed)
[facility] History and Physical   Patient: [name] [name]   PCP: Terrial Rhodes, MD DOB: 1960-10-14   DOA: 02/14/2018   DOS: 02/14/2018   DOS: the patient was seen and examined on 02/14/2018  Patient coming from: The patient is coming from SNF hospital.  Chief Complaint: Puslike discharge and foul-smelling from nonhealing ulcer  HPI: [name] is a 57 y.o. female with Past medical history of ESRD on HD TTS, type II DM with renal complication, chronic back pain, history of dementia, PVD S/P amputation, osteomyelitis S/P amputation, CVA, right AKA, left BKA. Patient was brought from hemodialysis.  Patient had sustained injury from a wheelchair a few weeks ago.  The wound was not healing well and therefore she was seen by wound care provider at the SNF.  They started her on some local wound dressing followed by oral doxycycline and Keflex which the patient is still supposed to be taking. When patient was at hemodialysis on 10 2319 they removed the dressing and found purulent discharge coming out from the wound with redness and streaking involving her dorsum of the hand and therefore patient was brought to the hospital for further work-up. Patient mentions that she has pain which is new at the local site.  No generalized fever nausea vomiting diarrhea abdominal pain.  No other change in her medications recently.  ED Course: Patient was given broad-spectrum IV antibiotics, x-ray was negative for ostiomyellitus, patient was referred for admission  At her baseline ambulates wheelchair And is independent for most of her ADL; manages her medication on her own.  Review of Systems: as mentioned in the history of present illness.  All other systems reviewed and are negative.  Past Medical History:  Diagnosis Date  . Anemia   . Blood transfusion   . Chronic back pain   . Dementia (HCC)   . Diabetes mellitus   . Hemodialysis patient (HCC) 03/15/11   "Tues; Thurs;  Sat; [facility]"  . Hypertension   . PEA (Pulseless electrical activity) (HCC)   . Peripheral vascular disease (HCC)   . Rash 03/15/11   "admitted me to find out what its' from; UE, waist, thighs, groin"  . Renal failure    hd 4/12  . Stroke [facility])    Past Surgical History:  Procedure Laterality Date  . AMPUTATION  03/04/2011   Procedure: AMPUTATION DIGIT;  Surgeon: [name], MD;  Location: [facility] OR;  Service: Orthopedics;  Laterality: Right;  RT GREAT TOE AMP  . AMPUTATION Right 03/13/2015   Procedure: RIGHT  ABOVE KNEE AMPUTATION;  Surgeon: Pryor Ochoa, MD;  Location: [facility] OR;  Service: Vascular;  Laterality: Right;  . ARTERIOVENOUS GRAFT PLACEMENT  10/01/10   left upper arm  . BELOW KNEE LEG AMPUTATION  2004   left  . CARPAL TUNNEL RELEASE  ~ 2009   left wrist  . CATARACT EXTRACTION W/ INTRAOCULAR LENS IMPLANT  12/2009   right eye  . HAND AMPUTATION Left   . INSERTION OF DIALYSIS CATHETER  07/29/10   right subclavian  . IR FLUORO RM 30-60 MIN  08/16/2016  . IR GASTROSTOMY TUBE MOD SED  08/23/2016  . IR GASTROSTOMY TUBE REMOVAL  02/07/2017  . TEE WITHOUT CARDIOVERSION  03/18/2011   Procedure: TRANSESOPHAGEAL ECHOCARDIOGRAM (TEE);  Surgeon: [name], MD;  Location: [facility] ENDOSCOPY;  Service: Cardiovascular;  Laterality: N/A;   Social History:  reports that she has never smoked. She has never used smokeless tobacco. She  reports that she does not drink alcohol or use drugs.  Allergies  Allergen Reactions  . Codeine Hives  . Levofloxacin Other (See Comments)    Unknown, but noted on MAR  . Penicillins Hives    Has patient had a PCN reaction causing immediate rash, facial/tongue/throat swelling, SOB or lightheadedness with hypotension: Yes Has patient had a PCN reaction causing severe rash involving mucus membranes or skin necrosis: Unk Has patient had a PCN reaction that required hospitalization: Unk Has patient had a PCN reaction occurring within the last 10  years: Unk If all of the above answers are "NO", then may proceed with Cephalosporin use.     Family History  Problem Relation Age of Onset  . Diabetes Mellitus II Mother   . Stroke Mother      Prior to Admission medications   Medication Sig Start Date End Date Taking? Authorizing Provider  DULoxetine (CYMBALTA) 60 MG capsule Take 60 mg by mouth daily.   Yes [provider]  Oxycodone HCl 10 MG TABS Take 10 mg by mouth 2 (two) times daily.   Yes [provider]  aspirin 81 MG chewable tablet Place 1 tablet (81 mg total) into feeding tube daily. Patient taking differently: Chew 81 mg by mouth daily.  08/28/16   Leroy Sea, MD  atorvastatin (LIPITOR) 10 MG tablet Take 10 mg by mouth daily.    [provider]  Cholecalciferol (VITAMIN D-3) 5000 units TABS Take 5,000 Units by mouth daily.    [provider]  cinacalcet (SENSIPAR) 30 MG tablet Take 30 mg by mouth at bedtime.    [provider]  docusate sodium (COLACE) 50 MG capsule Take 50 mg by mouth every evening.    [provider]  gabapentin (NEURONTIN) 100 MG capsule Take 100 mg by mouth 3 (three) times daily.     [provider]  glucagon (GLUCAGON EMERGENCY) 1 MG injection Inject 1 mg into the vein once as needed (for blood sugar).    [provider]  glycerin adult 2 g suppository Place 1 suppository rectally as needed for constipation.    [provider]  guaiFENesin (ROBITUSSIN) 100 MG/5ML SOLN Take 20 mLs by mouth every 8 (eight) hours as needed for cough or to loosen phlegm.    [provider]  HYDROcodone-acetaminophen (NORCO/VICODIN) 5-325 MG tablet Take 1-2 tablets by mouth every 6 (six) hours as needed. Patient taking differently: Take 1 tablet by mouth every 6 (six) hours as needed.  01/12/17   Roxy Horseman, PA-C  hydrocortisone cream 1 % Apply 1 application topically every 8 (eight) hours as needed for itching.    [provider]  Infant Care Products (DERMACLOUD) CREA Apply topically 3 (three) times daily. To buttocks    [provider]  insulin aspart (NOVOLOG) 100 UNIT/ML injection Inject 2-10 Units into the skin 3 (three) times daily before meals. Per sliding scale 0-59: notify doctor 60-100= 0 units 101-199 = 2 units 200-299 = 6 units 300-349 = 8 units 350-399 = 10 units 400    [provider]  lamoTRIgine (LAMICTAL) 25 MG tablet Take 12.5 mg by mouth at bedtime.     [provider]  levothyroxine (SYNTHROID, LEVOTHROID) 25 MCG tablet Place 1 tablet (25 mcg total) into feeding tube daily before breakfast. Patient taking differently: Take 25 mcg by mouth daily before breakfast.  08/28/16   Leroy Sea, MD  Melatonin-Pyridoxine 3-1 MG TABS Take 1 tablet by mouth at  bedtime.    [provider]  neomycin-bacitracin-polymyxin (NEOSPORIN) ointment Apply 1 application topically daily.    [provider]  ondansetron (ZOFRAN) 4 MG tablet Take 4 mg by mouth every 8 (eight) hours as needed for nausea or vomiting.    [provider]  pantoprazole (PROTONIX) 40 MG tablet Take 40 mg by mouth daily.     [provider]  polyethylene glycol (MIRALAX) packet Take 17 g by mouth daily. 05/04/17   Palumbo, April, MD  prednisoLONE acetate (PRED FORTE) 1 % ophthalmic suspension Place 1 drop into the right eye 4 (four) times daily. 05/11/17   Rennis Chris, MD  Probiotic Product (PROBIOTIC PO) Take 250 mg by mouth 2 (two) times daily.    [provider]  promethazine (PHENERGAN) 6.25 MG/5ML syrup Take 12.5 mg by mouth every 6 (six) hours as needed for nausea or vomiting.    [provider]  Propylene Glycol-Glycerin (ARTIFICIAL TEARS) 1-0.3 % SOLN Place 1 drop into both eyes every 12 (twelve) hours as needed (for eye iritation).    [provider]  senna-docusate (SENOKOT-S) 8.6-50 MG tablet Take 2 tablets by mouth at bedtime.      [provider]  sevelamer carbonate (RENVELA) 800 MG tablet Take 400 mg by mouth 3 (three) times daily with meals.    [provider]  sucralfate (CARAFATE) 1 g tablet Take 1 g by mouth 4 (four) times daily.    [provider]  triamcinolone (KENALOG) 0.025 % cream Apply 1 application topically 2 (two) times daily.    [provider]  Jeanann Lewandowsky (PREPARATION H TOTABLES WIPES) 50 % PADS Apply topically every 8 (eight) hours as needed (for hemmorrhoids).    [provider]    Physical Exam: Vitals:   02/14/18 1600 02/14/18 1700 02/14/18 1800 02/14/18 1830  BP: (!) 156/81 128/85 (!) 128/52 (!) 102/27  Pulse: 85 85 82 84  Resp:      Temp:      TempSrc:      SpO2: 100% 100% 99% 95%    General: Alert, Awake and Oriented to Time, Place and Person. Appear in mild distress, affect appropriate Eyes: PERRL, Conjunctiva normal ENT: Oral Mucosa clear moist. 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 Bilaterally Equal motor strength  Labs on Admission:  CBC: Recent Labs  Lab 02/14/18 1631  WBC 4.2  NEUTROABS 2.9  HGB 13.5  HCT 42.7  MCV 94.7  PLT 97*   Basic Metabolic Panel: Recent Labs  Lab 02/14/18 1631  NA 138  K 3.3*  CL 94*  CO2 29  GLUCOSE 114*  BUN 11  CREATININE 3.20*  CALCIUM 9.7   GFR: CrCl cannot be calculated (Unknown ideal weight.). Liver Function Tests: Recent Labs  Lab 02/14/18 1631  AST 18  ALT 12  ALKPHOS 168*  BILITOT 1.1  PROT 8.1    ALBUMIN 4.1   No results for input(s): LIPASE, AMYLASE in the last 168 hours. No results for input(s): AMMONIA in the last 168 hours. Coagulation Profile: No results for input(s): INR, PROTIME  in the last 168 hours. Cardiac Enzymes: No results for input(s): CKTOTAL, CKMB, CKMBINDEX, TROPONINI in the last 168 hours. BNP (last 3 results) No results for input(s): PROBNP in the last 8760 hours. HbA1C: No results for input(s): HGBA1C in the last 72 hours. CBG: No results for input(s): GLUCAP in the last 168 hours. Lipid Profile: No results for input(s): CHOL, HDL, LDLCALC, TRIG, CHOLHDL, LDLDIRECT in the last 72 hours. Thyroid Function Tests: No results for input(s): TSH, T4TOTAL, FREET4, T3FREE, THYROIDAB in the last 72 hours. Anemia Panel: No results for input(s): VITAMINB12, FOLATE, FERRITIN, TIBC, IRON, RETICCTPCT in the last 72 hours. Urine analysis:    Component Value Date/Time   COLORURINE YELLOW 07/26/2017 1850   APPEARANCEUR TURBID (A) 07/26/2017 1850   APPEARANCEUR Turbid 12/15/2011 0928   LABSPEC 1.015 07/26/2017 1850   LABSPEC 1.014 12/15/2011 0928   PHURINE 8.0 07/26/2017 1850   GLUCOSEU 150 (A) 07/26/2017 1850   GLUCOSEU 150 mg/dL 09/81/1914 7829   HGBUR SMALL (A) 07/26/2017 1850   BILIRUBINUR NEGATIVE 07/26/2017 1850   BILIRUBINUR Negative 12/15/2011 0928   KETONESUR NEGATIVE 07/26/2017 1850   PROTEINUR 100 (A) 07/26/2017 1850   UROBILINOGEN 0.2 03/19/2012 1714   NITRITE NEGATIVE 07/26/2017 1850   LEUKOCYTESUR MODERATE (A) 07/26/2017 1850   LEUKOCYTESUR 3+ 12/15/2011 0928    Radiological Exams on Admission: Dg Hand Complete Right  Result Date: 02/14/2018 CLINICAL DATA:  57 year old female with a history of right hand wound EXAM: RIGHT HAND - COMPLETE 3+ VIEW COMPARISON:  None. FINDINGS: Osteopenia.  Healed amputation of the third digit. No displaced fracture identified. No erosive changes. Contractures of the PIP and DIP is a without subluxation/dislocation.  Soft tissue injury is suggested on the dorsal aspect of the fourth digit on the oblique image. Degenerative changes at the first carpometacarpal joint. Diffuse calcifications of the visualized arteries. IMPRESSION: No acute bony abnormality identified. Soft tissue injury on the dorsal aspect of the fourth digit on the oblique image. Vascular calcifications. Healed amputation of the third digit. Electronically Signed   By: Gilmer Mor D.O.   On: 02/14/2018 16:53   Assessment/Plan 1.  Cellulitis of the hand. Concern is the patient may have early osteomyelitis. Currently on IV vancomycin. Orthopedic consulted. Patient was already scheduled to see them on 02/17/2018. I suspect that the patient requires some local debridement and long-term antibiotic. Follow-up on cultures.  2.  ESRD on HD. Completed her HD treatment on 02/14/2018. Next treatment will be due on 02/15/2018 lactic and patient be discharged home prior to that.  3.  Chronic pain syndrome. Reviewed medication with SNF.  Patient is on Cymbalta, OxyContin twice daily, gabapentin 3 times daily and Norco as needed. We will continue this medication. Also continue bowel regimen  4.  Type 2 diabetes mellitus. Renal complication. Sliding scale insulin.  5.  Mood disorder. Continue Lamictal.  Nutrition: renal carb diet DVT Prophylaxis: subcutaneous Heparin  Advance goals of care discussion: DNR DNI   Consults: orthopedic Dr Lajoyce Corners  Family Communication: no family was present at bedside, at the time of interview.  Disposition: Admitted as observation, med-surge unit. Likely to be discharged SNF, in 1 days.  Severity of Illness: The appropriate patient status for this patient is OBSERVATION. Observation status is judged to be reasonable and necessary in order to provide the required intensity of service to ensure the patient's safety. The patient's presenting symptoms, physical exam findings, and initial radiographic and laboratory  data in the context of their medical condition is felt  to place them at decreased risk for further clinical deterioration. Furthermore, it is anticipated that the patient will be medically stable for discharge from the hospital within 2 midnights of admission. The following factors support the patient status of observation.   " The patient's presenting symptoms include non healing ulcer. " The initial radiographic and laboratory data are negative for ostemyelitis.     Author: Lynden Oxford, MD [facility] 02/14/2018  If 7PM-7AM, please contact night-coverage [email or web address]

## 2018-02-14 NOTE — ED Notes (Signed)
Attempted report x1. 

## 2018-02-14 NOTE — Progress Notes (Signed)
New Admission Note:  Arrival Method: Via stretcher from ED Mental Orientation: Alert & Oriented x3 Telemetry: Assessment: Completed Skin: Refer to flowsheet IV: Right AC Pain: 3/10 Safety Measures: Safety Fall Prevention Plan discussed with patient. Admission: Completed [facility] [place] Orientation: Patient has been orientated to the room, unit and the staff.  Orders have been reviewed and are being implemented. Will continue to monitor the patient. Call light has been placed within reach and bed alarm has been activated.   Aram Candela, RN  Phone Number: [phone]

## 2018-02-15 ENCOUNTER — Encounter (HOSPITAL_COMMUNITY): Payer: Self-pay | Admitting: General Practice

## 2018-02-15 DIAGNOSIS — L089 Local infection of the skin and subcutaneous tissue, unspecified: Secondary | ICD-10-CM | POA: Diagnosis not present

## 2018-02-15 DIAGNOSIS — N186 End stage renal disease: Secondary | ICD-10-CM | POA: Diagnosis not present

## 2018-02-15 DIAGNOSIS — D649 Anemia, unspecified: Secondary | ICD-10-CM

## 2018-02-15 DIAGNOSIS — L03011 Cellulitis of right finger: Secondary | ICD-10-CM | POA: Diagnosis not present

## 2018-02-15 DIAGNOSIS — N2581 Secondary hyperparathyroidism of renal origin: Secondary | ICD-10-CM

## 2018-02-15 DIAGNOSIS — B999 Unspecified infectious disease: Secondary | ICD-10-CM

## 2018-02-15 DIAGNOSIS — Z992 Dependence on renal dialysis: Secondary | ICD-10-CM

## 2018-02-15 HISTORY — DX: Unspecified infectious disease: B99.9

## 2018-02-15 LAB — HEMOGLOBIN A1C
Hgb A1c MFr Bld: 5.8 % — ABNORMAL HIGH (ref 4.8–5.6)
MEAN PLASMA GLUCOSE: 119.76 mg/dL

## 2018-02-15 LAB — COMPREHENSIVE METABOLIC PANEL
ALT: 10 U/L (ref 0–44)
ANION GAP: 12 (ref 5–15)
AST: 15 U/L (ref 15–41)
Albumin: 3.6 g/dL (ref 3.5–5.0)
Alkaline Phosphatase: 141 U/L — ABNORMAL HIGH (ref 38–126)
BUN: 16 mg/dL (ref 6–20)
CO2: 30 mmol/L (ref 22–32)
CREATININE: 4.18 mg/dL — AB (ref 0.44–1.00)
Calcium: 9.5 mg/dL (ref 8.9–10.3)
Chloride: 96 mmol/L — ABNORMAL LOW (ref 98–111)
GFR calc Af Amer: 13 mL/min — ABNORMAL LOW (ref >60)
GFR, EST NON AFRICAN AMERICAN: 11 mL/min — AB (ref >60)
Glucose, Bld: 84 mg/dL (ref 70–99)
POTASSIUM: 3.5 mmol/L (ref 3.5–5.1)
SODIUM: 138 mmol/L (ref 135–145)
Total Bilirubin: 1.2 mg/dL (ref 0.3–1.2)
Total Protein: 7 g/dL (ref 6.5–8.1)

## 2018-02-15 LAB — CBC
HEMATOCRIT: 38.5 % (ref 36.0–46.0)
HEMOGLOBIN: 11.8 g/dL — AB (ref 12.0–15.0)
MCH: 29.1 pg (ref 26.0–34.0)
MCHC: 30.6 g/dL (ref 30.0–36.0)
MCV: 94.8 fL (ref 80.0–100.0)
Platelets: 86 10*3/uL — ABNORMAL LOW (ref 150–400)
RBC: 4.06 MIL/uL (ref 3.87–5.11)
RDW: 14.7 % (ref 11.5–15.5)
WBC: 3.2 10*3/uL — AB (ref 4.0–10.5)
nRBC: 0 % (ref 0.0–0.2)

## 2018-02-15 LAB — GLUCOSE, CAPILLARY
Glucose-Capillary: 77 mg/dL (ref 70–99)
Glucose-Capillary: 99 mg/dL (ref 70–99)
Glucose-Capillary: 109 mg/dL — ABNORMAL HIGH (ref 70–99)
Glucose-Capillary: 157 mg/dL — ABNORMAL HIGH (ref 70–99)

## 2018-02-15 LAB — HIV ANTIBODY (ROUTINE TESTING W REFLEX): HIV Screen 4th Generation wRfx: NONREACTIVE

## 2018-02-15 LAB — MRSA PCR SCREENING: MRSA by PCR: NEGATIVE

## 2018-02-15 MED ORDER — CHLORHEXIDINE GLUCONATE CLOTH 2 % EX PADS: 6.0000 | MEDICATED_PAD | Freq: Every day | CUTANEOUS | Status: DC

## 2018-02-15 MED ORDER — DOXYCYCLINE HYCLATE 100 MG PO CAPS: 100.0000 mg | ORAL_CAPSULE | Freq: Two times a day (BID) | ORAL | 0 refills | Status: AC

## 2018-02-15 MED ORDER — MUPIROCIN CALCIUM 2 % EX CREA
TOPICAL_CREAM | Freq: Two times a day (BID) | CUTANEOUS | Status: DC
  Administered 2018-02-15 (×2): via TOPICAL
  Filled 2018-02-15: qty 15

## 2018-02-15 MED ORDER — DOXERCALCIFEROL 4 MCG/2ML IV SOLN: 6.0000 ug | INTRAVENOUS | Status: DC

## 2018-02-15 MED ORDER — MUPIROCIN 2 % EX OINT: 1.0000 "application " | TOPICAL_OINTMENT | Freq: Two times a day (BID) | CUTANEOUS | 3 refills | Status: AC

## 2018-02-15 MED ORDER — CINACALCET HCL 30 MG PO TABS
30.0000 mg | ORAL_TABLET | Freq: Every day | ORAL | Status: DC
  Administered 2018-02-15: 30 mg via ORAL
  Filled 2018-02-15: qty 1

## 2018-02-15 NOTE — Consult Note (Addendum)
ORTHOPAEDIC CONSULTATION  REQUESTING PHYSICIAN: Jerald Kief, MD  Chief Complaint: Ulceration right ring finger.  HPI: [name] is a 57 y.o. 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  Past Medical History:  Diagnosis Date  . Anemia   . Blood transfusion   . Chronic back pain   . Dementia (HCC)   . Diabetes mellitus   . Hemodialysis patient (HCC) 03/15/11   "Tues; Thurs; Sat; [facility]"  . Hypertension   . Infection 02/15/2018   right hand 4th digit   . PEA (Pulseless electrical activity) (HCC)   . Peripheral vascular disease (HCC)   . Rash 03/15/11   "admitted me to find out what its' from; UE, waist, thighs, groin"  . Renal failure    hd 4/12  . Stroke [facility])    Past Surgical History:  Procedure Laterality Date  . AMPUTATION  03/04/2011   Procedure: AMPUTATION DIGIT;  Surgeon: [name], MD;  Location: [facility] OR;  Service: Orthopedics;  Laterality: Right;  RT GREAT TOE AMP  . AMPUTATION Right 03/13/2015   Procedure: RIGHT  ABOVE KNEE AMPUTATION;  Surgeon: Pryor Ochoa, MD;  Location: [facility] OR;  Service: Vascular;  Laterality: Right;  . ARTERIOVENOUS GRAFT PLACEMENT  10/01/10   left upper arm  . BELOW KNEE LEG AMPUTATION  2004   left  . CARPAL TUNNEL RELEASE  ~ 2009   left wrist  . CATARACT EXTRACTION W/ INTRAOCULAR LENS IMPLANT  12/2009   right eye  . HAND AMPUTATION Left   . INSERTION OF DIALYSIS CATHETER  07/29/10   right subclavian  . IR FLUORO RM 30-60 MIN  08/16/2016  . IR GASTROSTOMY TUBE MOD SED  08/23/2016  . IR GASTROSTOMY TUBE REMOVAL  02/07/2017  . TEE WITHOUT CARDIOVERSION  03/18/2011   Procedure: TRANSESOPHAGEAL ECHOCARDIOGRAM (TEE);  Surgeon: [name], MD;  Location: [facility] ENDOSCOPY;  Service: Cardiovascular;  Laterality: N/A;   Social  History   Socioeconomic History  . Marital status: Married    Spouse name: Not on file  . Number of children: Not on file  . Years of education: Not on file  . Highest education level: Not on file  Occupational History  . Not on file  Social Needs  . Financial resource strain: Not on file  . Food insecurity:    Worry: Not on file    Inability: Not on file  . Transportation needs:    Medical: Not on file    Non-medical: Not on file  Tobacco Use  . Smoking status: Never Smoker  . Smokeless tobacco: Never Used  Substance and Sexual Activity  . Alcohol use: No  . Drug use: No  . Sexual activity: Never    Birth control/protection: Post-menopausal  Lifestyle  . Physical activity:    Days per week: Not on file    Minutes per session: Not on file  . Stress: Not on file  Relationships  . Social connections:    Talks on phone: Not on file    Gets together: Not on file    Attends religious service: Not on file    Active member of club or organization: Not on file    Attends meetings of clubs or organizations: Not on file    Relationship status: Not on file  Other Topics Concern  .  Not on file  Social History Narrative  . Not on file   Family History  Problem Relation Age of Onset  . Diabetes Mellitus II Mother   . Stroke Mother    - negative except otherwise stated in the family history section Allergies  Allergen Reactions  . Codeine Hives  . Levofloxacin Other (See Comments)    Unknown, but noted on MAR  . Penicillins Hives    Has patient had a PCN reaction causing immediate rash, facial/tongue/throat swelling, SOB or lightheadedness with hypotension: Yes Has patient had a PCN reaction causing severe rash involving mucus membranes or skin necrosis: Unk Has patient had a PCN reaction that required hospitalization: Unk Has patient had a PCN reaction occurring within the last 10 years: Unk If all of the above answers are "NO", then may proceed with Cephalosporin use.     Prior to Admission medications   Medication Sig Start Date End Date Taking? Authorizing Provider  DULoxetine (CYMBALTA) 60 MG capsule Take 60 mg by mouth daily.   Yes [provider]  Oxycodone HCl 10 MG TABS Take 10 mg by mouth 2 (two) times daily.   Yes [provider]  aspirin 81 MG chewable tablet Place 1 tablet (81 mg total) into feeding tube daily. Patient taking differently: Chew 81 mg by mouth daily.  08/28/16   Leroy Sea, MD  atorvastatin (LIPITOR) 10 MG tablet Take 10 mg by mouth daily.    [provider]  Cholecalciferol (VITAMIN D-3) 5000 units TABS Take 5,000 Units by mouth daily.    [provider]  cinacalcet (SENSIPAR) 30 MG tablet Take 30 mg by mouth at bedtime.    [provider]  docusate sodium (COLACE) 50 MG capsule Take 50 mg by mouth every evening.    [provider]  gabapentin (NEURONTIN) 100 MG capsule Take 100 mg by mouth 3 (three) times daily.     [provider]  glucagon (GLUCAGON EMERGENCY) 1 MG injection Inject 1 mg into the vein once as needed (for blood sugar).    [provider]  glycerin adult 2 g suppository Place 1 suppository rectally as needed for constipation.    [provider]  guaiFENesin (ROBITUSSIN) 100 MG/5ML SOLN Take 20 mLs by mouth every 8 (eight) hours as needed for cough or to loosen phlegm.    [provider]  HYDROcodone-acetaminophen (NORCO/VICODIN) 5-325 MG tablet Take 1-2 tablets by mouth every 6 (six) hours as needed. Patient taking differently: Take 1 tablet by mouth every 6 (six) hours as needed.  01/12/17   Roxy Horseman, PA-C  hydrocortisone cream 1 % Apply 1 application topically every 8 (eight) hours as needed for itching.    [provider]  Infant Care Products (DERMACLOUD) CREA Apply topically 3 (three) times daily. To buttocks    [provider]  insulin aspart (NOVOLOG) 100 UNIT/ML injection Inject 2-10 Units  into the skin 3 (three) times daily before meals. Per sliding scale 0-59: notify doctor 60-100= 0 units 101-199 = 2 units 200-299 = 6 units 300-349 = 8 units 350-399 = 10 units 400    [provider]  lamoTRIgine (LAMICTAL) 25 MG tablet Take 12.5 mg by mouth at bedtime.     [provider]  levothyroxine (SYNTHROID, LEVOTHROID) 25 MCG tablet Place 1 tablet (25 mcg total) into feeding tube daily before breakfast. Patient taking differently: Take 25 mcg by mouth daily before breakfast.  08/28/16   Leroy Sea, MD  Melatonin-Pyridoxine 3-1 MG TABS Take 1 tablet by mouth at bedtime.    [provider]  neomycin-bacitracin-polymyxin (NEOSPORIN) ointment Apply 1 application topically daily.    [provider]  ondansetron (ZOFRAN) 4 MG tablet Take 4 mg by mouth every 8 (eight) hours as needed for nausea or vomiting.    [provider]  pantoprazole (PROTONIX) 40 MG tablet Take 40 mg by mouth daily.     [provider]  polyethylene glycol (MIRALAX) packet Take 17 g by mouth daily. 05/04/17   Palumbo, April, MD  prednisoLONE acetate (PRED FORTE) 1 % ophthalmic suspension Place 1 drop into the right eye 4 (four) times daily. 05/11/17   Rennis Chris, MD  Probiotic Product (PROBIOTIC PO) Take 250 mg by mouth 2 (two) times daily.    [provider]  promethazine (PHENERGAN) 6.25 MG/5ML syrup Take 12.5 mg by mouth every 6 (six) hours as needed for nausea or vomiting.    [provider]  Propylene Glycol-Glycerin (ARTIFICIAL TEARS) 1-0.3 % SOLN Place 1 drop into both eyes every 12 (twelve) hours as needed (for eye iritation).    [provider]  senna-docusate (SENOKOT-S) 8.6-50 MG tablet Take 2 tablets by mouth at bedtime.     [provider]  sevelamer carbonate (RENVELA) 800 MG tablet Take 400 mg by mouth 3 (three) times daily with meals.    [provider]  sucralfate (CARAFATE) 1 g tablet Take 1 g by  mouth 4 (four) times daily.    [provider]  triamcinolone (KENALOG) 0.025 % cream Apply 1 application topically 2 (two) times daily.    [provider]  Jeanann Lewandowsky (PREPARATION H TOTABLES WIPES) 50 % PADS Apply topically every 8 (eight) hours as needed (for hemmorrhoids).    [provider]   Dg Hand Complete Right  Result Date: 02/14/2018 CLINICAL DATA:  57 year old female with a history of right hand wound EXAM: RIGHT HAND - COMPLETE 3+ VIEW COMPARISON:  None. FINDINGS: Osteopenia.  Healed amputation of the third digit. No displaced fracture identified. No erosive changes. Contractures of the PIP and DIP is a without subluxation/dislocation. Soft tissue injury is suggested on the dorsal aspect of the fourth digit on the oblique image. Degenerative changes at the first carpometacarpal joint. Diffuse calcifications of the visualized arteries. IMPRESSION: No acute bony abnormality identified. Soft tissue injury on the dorsal aspect of the fourth digit on the oblique image. Vascular calcifications. Healed amputation of the third digit. Electronically Signed   By: Gilmer Mor D.O.   On: 02/14/2018 16:53   - pertinent xrays, CT, MRI studies were reviewed and independently interpreted  Positive ROS: All other systems have been reviewed and were otherwise negative with the exception of those mentioned in the HPI and as above.  Physical Exam: General: Alert, no acute distress Psychiatric: Patient is competent for consent with normal mood and affect Lymphatic: No axillary or cervical lymphadenopathy Cardiovascular: No pedal edema Respiratory: No cyanosis, no use of accessory musculature GI: No organomegaly, abdomen is soft and non-tender    Images:  @ENCIMAGES @  Labs:  Lab Results  Component Value Date   HGBA1C 5.8 (H) 02/15/2018   HGBA1C <4.2 (L) 08/24/2016   HGBA1C 6.0 (H) 06/11/2015   ESRSEDRATE 130 (H) 12/12/2014   ESRSEDRATE 73 (H) 12/15/2011    ESRSEDRATE 58 (H) 03/15/2011   CRP 17.9 (H) 12/12/2014   CRP 5.29 (H) 03/15/2011   REPTSTATUS 07/28/2017 FINAL 07/26/2017   GRAMSTAIN  01/24/2012  FEW WBC PRESENT,BOTH PMN AND MONONUCLEAR NO SQUAMOUS EPITHELIAL CELLS SEEN NO ORGANISMS SEEN   CULT  07/26/2017    NO GROWTH Performed at [facility], [address] [place], [place] [address]    LABORGA PSEUDOMONAS AERUGINOSA 08/24/2016    Lab Results  Component Value Date   ALBUMIN 3.6 02/15/2018   ALBUMIN 4.1 02/14/2018   ALBUMIN 3.8 07/26/2017    Neurologic: Patient does not have protective sensation bilateral lower extremities.   MUSCULOSKELETAL:   Skin: Examination patient has a stable transtibial amputation on the left with no ulceration.  Examination the right ring finger she has fixed flexion contracture of the ring finger with the finger fixed into the palm of her hand.  There is a black eschar over the dorsal ulnar aspect of the PIP joint.  There is good epithelization around the eschar.  The abscess has been decompressed.  There is no sausage digit swelling there is no flexor tenosynovitis there is no cellulitis extending proximal to the PIP joint.  Patient's finger is not tender to palpation.  Assessment: Assessment: Ischemic ulcer secondary to blunt trauma dorsal ulnar aspect of the PIP joint right ring finger.  Plan: Plan: The abscess has been decompressed.  Would recommend continuing IV antibiotics until discharge and then discharged on doxycycline.  I will write orders for Bactroban ointment to be applied to the eschar daily.  I will follow-up in the office in 1 week.  Patient will need a prescription for a motorized wheelchair.  I will complete this paperwork as an outpatient.  Thank you for the consult and the opportunity to see Ms. Benard Rink, MD [facility] [phone] 9:19 AM

## 2018-02-15 NOTE — Progress Notes (Signed)
Given all medication  for this evening. Alert and oriented x 4. HS snack  given. Awaiting [facility] for transport.

## 2018-02-15 NOTE — Consult Note (Addendum)
 [facility] Renal Consultation Note    Indication for Consultation:  Management of ESRD/hemodialysis; anemia, hypertension/volume and secondary hyperparathyroidism PCP:  HPI: [name] is a 57 y.o. female with ESRD on hemodialysis MWF at [facility]. PMH of DM, HTN, R AKA, L BKA, amputation of L hand, dementia, CVA, AOCD, SHPT. Last HD 02/14/18 ran 2 hrs 53 minute of 4 hour treatment, left 0.6 kg above OP EDW. Patient is compliant with HD, does not miss or shorten treatments.   Patient has been C/O wound/pain R ring finger since 12/18/2017. At that time, there was no open wound, had callous on DIP joint. Hand was examined in HD clinic and referred to hand specialist-unsure is this was done. Apparently 3 weeks ago, she sustained mechanical injury to same finger. She has been being treated by wound care at SNF which has been slow to heal. Patient presented to ED post dialysis yesterday after staff discovered purulent drainage from wound with erythremia of dorsal surface of hand. No fever, chills. She has been admitted as observation patient for cellulitis of R hand, started on vancomycin per primary. Working up for Osteomyelitis.   Past Medical History:  Diagnosis Date  . Anemia   . Blood transfusion   . Chronic back pain   . Dementia (HCC)   . Diabetes mellitus   . Hemodialysis patient (HCC) 03/15/11   "Tues; Thurs; Sat; [facility]"  . Hypertension   . Infection 02/15/2018   right hand 4th digit   . PEA (Pulseless electrical activity) (HCC)   . Peripheral vascular disease (HCC)   . Rash 03/15/11   "admitted me to find out what its' from; UE, waist, thighs, groin"  . Renal failure    hd 4/12  . Stroke [facility])    Past Surgical History:  Procedure Laterality Date  . AMPUTATION  03/04/2011   Procedure: AMPUTATION DIGIT;  Surgeon: [name], MD;  [facility];  Service: Orthopedics;  Laterality: Right;  RT GREAT TOE AMP  .  AMPUTATION Right 03/13/2015   Procedure: RIGHT  ABOVE KNEE AMPUTATION;  Surgeon: Pryor Ochoa, MD;  Location: [facility] OR;  Service: Vascular;  Laterality: Right;  . ARTERIOVENOUS GRAFT PLACEMENT  10/01/10   left upper arm  . BELOW KNEE LEG AMPUTATION  2004   left  . CARPAL TUNNEL RELEASE  ~ 2009   left wrist  . CATARACT EXTRACTION W/ INTRAOCULAR LENS IMPLANT  12/2009   right eye  . HAND AMPUTATION Left   . INSERTION OF DIALYSIS CATHETER  07/29/10   right subclavian  . IR FLUORO RM 30-60 MIN  08/16/2016  . IR GASTROSTOMY TUBE MOD SED  08/23/2016  . IR GASTROSTOMY TUBE REMOVAL  02/07/2017  . TEE WITHOUT CARDIOVERSION  03/18/2011   Procedure: TRANSESOPHAGEAL ECHOCARDIOGRAM (TEE);  Surgeon: [name], MD;  Location: [facility] ENDOSCOPY;  Service: Cardiovascular;  Laterality: N/A;   Family History  Problem Relation Age of Onset  . Diabetes Mellitus II Mother   . Stroke Mother    Social History:  reports that she has never smoked. She has never used smokeless tobacco. She reports that she does not drink alcohol or use drugs. Allergies  Allergen Reactions  . Codeine Hives  . Levofloxacin Other (See Comments)    Unknown, but noted on MAR  . Penicillins Hives    Has patient had a PCN reaction causing immediate rash, facial/tongue/throat swelling, SOB or lightheadedness with hypotension: Yes Has patient had a  PCN reaction causing severe rash involving mucus membranes or skin necrosis: Unk Has patient had a PCN reaction that required hospitalization: Unk Has patient had a PCN reaction occurring within the last 10 years: Unk If all of the above answers are "NO", then may proceed with Cephalosporin use.    Prior to Admission medications   Medication Sig Start Date End Date Taking? Authorizing Provider  DULoxetine (CYMBALTA) 60 MG capsule Take 60 mg by mouth daily.   Yes [provider]  Oxycodone HCl 10 MG TABS Take 10 mg by mouth 2 (two) times daily.   Yes [provider]  aspirin  81 MG chewable tablet Place 1 tablet (81 mg total) into feeding tube daily. Patient taking differently: Chew 81 mg by mouth daily.  08/28/16   Leroy Sea, MD  atorvastatin (LIPITOR) 10 MG tablet Take 10 mg by mouth daily.    [provider]  Cholecalciferol (VITAMIN D-3) 5000 units TABS Take 5,000 Units by mouth daily.    [provider]  cinacalcet (SENSIPAR) 30 MG tablet Take 30 mg by mouth at bedtime.    [provider]  docusate sodium (COLACE) 50 MG capsule Take 50 mg by mouth every evening.    [provider]  gabapentin (NEURONTIN) 100 MG capsule Take 100 mg by mouth 3 (three) times daily.     [provider]  glucagon (GLUCAGON EMERGENCY) 1 MG injection Inject 1 mg into the vein once as needed (for blood sugar).    [provider]  glycerin adult 2 g suppository Place 1 suppository rectally as needed for constipation.    [provider]  guaiFENesin (ROBITUSSIN) 100 MG/5ML SOLN Take 20 mLs by mouth every 8 (eight) hours as needed for cough or to loosen phlegm.    [provider]  HYDROcodone-acetaminophen (NORCO/VICODIN) 5-325 MG tablet Take 1-2 tablets by mouth every 6 (six) hours as needed. Patient taking differently: Take 1 tablet by mouth every 6 (six) hours as needed.  01/12/17   Roxy Horseman, PA-C  hydrocortisone cream 1 % Apply 1 application topically every 8 (eight) hours as needed for itching.    [provider]  Infant Care Products (DERMACLOUD) CREA Apply topically 3 (three) times daily. To buttocks    [provider]  insulin aspart (NOVOLOG) 100 UNIT/ML injection Inject 2-10 Units into the skin 3 (three) times daily before meals. Per sliding scale 0-59: notify doctor 60-100= 0 units 101-199 = 2 units 200-299 = 6 units 300-349 = 8 units 350-399 = 10 units 400    [provider]  lamoTRIgine (LAMICTAL) 25 MG tablet Take 12.5 mg by mouth at bedtime.     [provider]  levothyroxine (SYNTHROID, LEVOTHROID) 25 MCG tablet Place 1 tablet (25 mcg total) into feeding tube daily before breakfast. Patient taking differently: Take 25 mcg by mouth daily before breakfast.  08/28/16   Leroy Sea, MD  Melatonin-Pyridoxine 3-1 MG TABS Take 1 tablet by mouth at bedtime.    [provider]  mupirocin ointment (BACTROBAN) 2 % Apply 1 application topically 2 (two) times daily. Apply to the affected area 2 times a day 02/15/18   [name], MD  neomycin-bacitracin-polymyxin (NEOSPORIN) ointment Apply 1 application topically daily.    [provider]  ondansetron (ZOFRAN) 4 MG tablet Take 4 mg by mouth every 8 (eight) hours as needed for nausea or vomiting.    [provider]  pantoprazole (PROTONIX) 40 MG tablet Take  40 mg by mouth daily.     [provider]  polyethylene glycol (MIRALAX) packet Take 17 g by mouth daily. 05/04/17   Palumbo, April, MD  prednisoLONE acetate (PRED FORTE) 1 % ophthalmic suspension Place 1 drop into the right eye 4 (four) times daily. 05/11/17   Rennis Chris, MD  Probiotic Product (PROBIOTIC PO) Take 250 mg by mouth 2 (two) times daily.    [provider]  promethazine (PHENERGAN) 6.25 MG/5ML syrup Take 12.5 mg by mouth every 6 (six) hours as needed for nausea or vomiting.    [provider]  Propylene Glycol-Glycerin (ARTIFICIAL TEARS) 1-0.3 % SOLN Place 1 drop into both eyes every 12 (twelve) hours as needed (for eye iritation).    [provider]  senna-docusate (SENOKOT-S) 8.6-50 MG tablet Take 2 tablets by mouth at bedtime.     [provider]  sevelamer carbonate (RENVELA) 800 MG tablet Take 400 mg by mouth 3 (three) times daily with meals.    [provider]  sucralfate (CARAFATE) 1 g tablet Take 1 g by mouth 4 (four) times daily.    [provider]  triamcinolone (KENALOG) 0.025 % cream Apply 1 application topically 2 (two) times  daily.    [provider]  Jeanann Lewandowsky (PREPARATION H TOTABLES WIPES) 50 % PADS Apply topically every 8 (eight) hours as needed (for hemmorrhoids).    [provider]   Current Facility-Administered Medications  Medication Dose Route Frequency Provider Last Rate Last Dose  . acetaminophen (TYLENOL) tablet 650 mg  650 mg Oral Q6H PRN Rolly Salter, MD       Or  . acetaminophen (TYLENOL) suppository 650 mg  650 mg Rectal Q6H PRN Rolly Salter, MD      . aspirin chewable tablet 81 mg  81 mg Oral Daily Rolly Salter, MD   81 mg at 02/15/18 1610  . atorvastatin (LIPITOR) tablet 10 mg  10 mg Oral Daily Rolly Salter, MD   10 mg at 02/15/18 9604  . docusate sodium (COLACE) capsule 100 mg  100 mg Oral BID Rolly Salter, MD   100 mg at 02/15/18 5409  . DULoxetine (CYMBALTA) DR capsule 60 mg  60 mg Oral Daily Rolly Salter, MD   60 mg at 02/15/18 8119  . gabapentin (NEURONTIN) capsule 100 mg  100 mg Oral TID Rolly Salter, MD   100 mg at 02/15/18 0927  . heparin injection 5,000 Units  5,000 Units Subcutaneous Q8H Rolly Salter, MD   5,000 Units at 02/15/18 1332  . HYDROcodone-acetaminophen (NORCO/VICODIN) 5-325 MG per tablet 1 tablet  1 tablet Oral Q6H PRN Rolly Salter, MD   1 tablet at 02/15/18 1050  . insulin aspart (novoLOG) injection 0-5 Units  0-5 Units Subcutaneous QHS Lynden Oxford M, MD      . insulin aspart (novoLOG) injection 0-9 Units  0-9 Units Subcutaneous TID WC Rolly Salter, MD      . lamoTRIgine (LAMICTAL) tablet 12.5 mg  12.5 mg Oral QHS Rolly Salter, MD   12.5 mg at 02/15/18 0041  . levothyroxine (SYNTHROID, LEVOTHROID) tablet 25 mcg  25 mcg Oral QAC breakfast Rolly Salter, MD   25 mcg at 02/15/18 0534  . mupirocin cream (BACTROBAN) 2 %   Topical BID [name], MD      . ondansetron [facility]) tablet 4 mg  4 mg Oral Q6H PRN Rolly Salter, MD  Or  . ondansetron (ZOFRAN) injection 4 mg  4 mg Intravenous Q6H PRN Rolly Salter, MD       . oxyCODONE (Oxy IR/ROXICODONE) immediate release tablet 10 mg  10 mg Oral BID Rolly Salter, MD   10 mg at 02/15/18 1610  . pantoprazole (PROTONIX) EC tablet 40 mg  40 mg Oral Daily Rolly Salter, MD   40 mg at 02/15/18 9604  . [START ON 02/16/2018] vancomycin (VANCOCIN) IVPB 750 mg/150 ml premix  750 mg Intravenous Q M,W,F-HD Koleen Nimrod, [place]       Labs: Basic Metabolic Panel: Recent Labs  Lab 02/14/18 1631 02/15/18 0419  NA 138 138  K 3.3* 3.5  CL 94* 96*  CO2 29 30  GLUCOSE 114* 84  BUN 11 16  CREATININE 3.20* 4.18*  CALCIUM 9.7 9.5   Liver Function Tests: Recent Labs  Lab 02/14/18 1631 02/15/18 0419  AST 18 15  ALT 12 10  ALKPHOS 168* 141*  BILITOT 1.1 1.2  PROT 8.1 7.0  ALBUMIN 4.1 3.6   No results for input(s): LIPASE, AMYLASE in the last 168 hours. No results for input(s): AMMONIA in the last 168 hours. CBC: Recent Labs  Lab 02/14/18 1631 02/15/18 0419  WBC 4.2 3.2*  NEUTROABS 2.9  --   HGB 13.5 11.8*  HCT 42.7 38.5  MCV 94.7 94.8  PLT 97* 86*   Cardiac Enzymes: No results for input(s): CKTOTAL, CKMB, CKMBINDEX, TROPONINI in the last 168 hours. CBG: Recent Labs  Lab 02/14/18 2125 02/15/18 0831 02/15/18 1125  GLUCAP 98 77 99   Iron Studies: No results for input(s): IRON, TIBC, TRANSFERRIN, FERRITIN in the last 72 hours. Studies/Results: Dg Hand Complete Right  Result Date: 02/14/2018 CLINICAL DATA:  57 year old female with a history of right hand wound EXAM: RIGHT HAND - COMPLETE 3+ VIEW COMPARISON:  None. FINDINGS: Osteopenia.  Healed amputation of the third digit. No displaced fracture identified. No erosive changes. Contractures of the PIP and DIP is a without subluxation/dislocation. Soft tissue injury is suggested on the dorsal aspect of the fourth digit on the oblique image. Degenerative changes at the first carpometacarpal joint. Diffuse calcifications of the visualized arteries. IMPRESSION: No acute bony  abnormality identified. Soft tissue injury on the dorsal aspect of the fourth digit on the oblique image. Vascular calcifications. Healed amputation of the third digit. Electronically Signed   By: Gilmer Mor D.O.   On: 02/14/2018 16:53    ROS: As per HPI otherwise negative.   Physical Exam: Vitals:   02/14/18 1930 02/14/18 2127 02/15/18 0505 02/15/18 0926  BP: (!) 136/47 (!) 132/49 (!) 112/31 (!) 101/32  Pulse: 80 81 82 69  Resp:  18 18 18   Temp:  98.2 F (36.8 C) 98.4 F (36.9 C) 98.2 F (36.8 C)  TempSrc:  Oral Oral Oral  SpO2: 96% 94% 93% 100%  Weight:  75.9 kg       General: Chronically ill appearing femalein no acute distress. Head: Normocephalic, atraumatic, sclera non-icteric, mucus membranes are moist Neck: Supple. JVD not elevated. Lungs: Clear bilaterally to auscultation without wheezes, rales, or rhonchi. Breathing is unlabored. Heart: RRR with S1 S2. No murmurs, rubs, or gallops appreciated. Abdomen: Soft, non-tender, non-distended with normoactive bowel sounds. No rebound/guarding. No obvious abdominal masses. M-S:  Strength and tone appear normal for age. Contracture R ring finger with eschar over PIP. No drainage at present. Lower extremities: R AKA, L BKA no stump edema.  Neuro: Alert and oriented  X 3. Moves all extremities spontaneously. Psych:  Responds to questions appropriately with a normal affect. Dialysis Access: LUA AVF + bruit  Dialysis Orders: [facility] MWF 4 hrs 180NRe 400/Autoflow 1.5 75.5 kg 2.0 K/2.25 Ca UFP 4 LUA AVF  -Heparin 2200 units TIW -Hectorol 6 mcg IV TIW  Assessment/Plan: 1.  Ischemic ulcer secondary to trauma-per primary. On Vanc. Seen by ortho. Recommends follow up in office after 1 week.  2.  ESRD -  MWF HD tomorrow on schedule 3.  Hypertension/volume  - No evidence of volume overload, BP well controlled. Current weight just above OP EDW. UFG 0.5-1 liter.  4.  Anemia  - HGB 11.8. No OP ESA. Follow HGB.  5.  Metabolic bone disease -   Ca 9.5 Binders on hold, continue VDRA, Sensipar.  6.  Nutrition - Renal diet, nepro, renal vits. 7.  DM-per primary  Dene Gentry. Manson Passey, NP-C 02/15/2018, 2:48 PM  [employer] [phone]

## 2018-02-15 NOTE — Evaluation (Signed)
Physical Therapy Evaluation Patient Details Name: DESTIN KITTLER MRN: [id number] DOB: 1961-01-07 Today's Date: 02/15/2018   History of Present Illness   57 y.o. 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  Pt admitted with R finger cellulitis, finger had been injured on pt's WC.   Clinical Impression  Pt is at baseline of dependence for transfers. At her SNF, staff used a mechanical lift to transfer her to her manual WC. She stated she hasn't ambulated for several years, since her BLE prostheses were lost. She is hopeful to get new prostheses and walk again. No further acute PT indicated as pt is at baseline of dependence for mobility.     Follow Up Recommendations SNF    Equipment Recommendations  Other (comment)(motorized WC, per chart, Dr. Lajoyce Corners plans to order this on outpt basis)    Recommendations for Other Services       Precautions / Restrictions Precautions Precautions: Fall Restrictions Weight Bearing Restrictions: No      Mobility  Bed Mobility Overal bed mobility: Needs Assistance Bed Mobility: Supine to Sit;Sit to Supine     Supine to sit: Mod assist;HOB elevated Sit to supine: Supervision   General bed mobility comments: mod A to raise trunk  Transfers                 General transfer comment: NT- mechanical lift for transfers at baseline  Ambulation/Gait                Stairs            Wheelchair Mobility    Modified Rankin (Stroke Patients Only)       Balance Overall balance assessment: Needs assistance Sitting-balance support: Single extremity supported;Feet unsupported Sitting balance-Leahy Scale: Fair Sitting balance - Comments: supervision for safety, pt sat on edge of bed x 12 minutes with no loss of balance                                      Pertinent Vitals/Pain Pain Assessment: No/denies pain    Home Living Family/patient expects to be discharged to:: Skilled nursing facility                 Additional Comments: pt has been SNF resident for several years, pt unable to recall exactly how many years    Prior Function Level of Independence: Needs assistance   Gait / Transfers Assistance Needed: total assist, mechanical lift to WC, self propelled manual WC with R hand; pt reports her B prosthesis were lost a couple years ago, prior to that she ambulated with assistance, she would like to get new prostheses and try to walk again  ADL's / Homemaking Assistance Needed: dependent        Hand Dominance        Extremity/Trunk Assessment   Upper Extremity Assessment Upper Extremity Assessment: RUE deficits/detail RUE Deficits / Details: flexion contracture for all fingers (since CVA)    Lower Extremity Assessment Lower Extremity Assessment: (L BKA, R AKA)       Communication   Communication: No difficulties  Cognition Arousal/Alertness: Awake/alert Behavior During Therapy: WFL for tasks assessed/performed Overall Cognitive Status: No family/caregiver present to determine baseline cognitive functioning  General Comments: follows commands, is oriented, doesn't recall when she had her stroke, nor how long she's been at a SNF      General Comments      Exercises     Assessment/Plan    PT Assessment Patent does not need any further PT services  PT Problem List         PT Treatment Interventions      PT Goals (Current goals can be found in the Care Plan section)  Acute Rehab PT Goals Patient Stated Goal: to get a motorized WC, eventually get new LE prosthesis and walk again PT Goal Formulation: All assessment and education complete, DC therapy    Frequency     Barriers to discharge        Co-evaluation                AM-PAC PT "6 Clicks" Daily Activity  Outcome Measure Difficulty turning over in bed (including adjusting bedclothes, sheets and blankets)?: A Little Difficulty moving from lying on back to sitting on the side of the bed? : Unable Difficulty sitting down on and standing up from a chair with arms (e.g., wheelchair, bedside commode, etc,.)?: Unable Help needed moving to and from a bed to chair (including a wheelchair)?: Total Help needed walking in hospital room?: Total Help needed climbing 3-5 steps with a railing? : Total 6 Click Score: 8    End of Session   Activity Tolerance: Patient tolerated treatment well Patient left: in bed;with call bell/phone within reach;with bed alarm set Nurse Communication: Mobility status;Need for lift equipment      Time: 1149-1210 PT Time Calculation (min) (ACUTE ONLY): 21 min   Charges:   PT Evaluation $PT Eval Low Complexity: 1 Low          Ralene Bathe Kistler PT 02/15/2018  Acute Rehabilitation Services Pager [phone] Office [phone]

## 2018-02-15 NOTE — Evaluation (Signed)
Occupational Therapy Evaluation Patient Details Name: [name] MRN: [id number] DOB: Dec 21, 1960 Today's Date: 02/15/2018    History of Present Illness  57 y.o. 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  Pt admitted with R finger cellulitis, finger had been injured on pt's WC.    Clinical Impression   Pt admitted with R finger cellulitis, several amputations at baseline and mostly dependent at baseline. When seen for evaluation, pt appears at functional baseline. Pt has been SNF resident for "several years". "Mechanical lift used at baseline for t/fs, dependent for most BADL (see below). Due to wound on R ring finger PIP, pt reports difficulty grasping utensils for feeding/grooming. Administered U-cuff and provided pt instruction to show caregiver don/doff and usage of AE. Pt completing rolling independently and sitting EOB with mod A for trunk support. No further acute needs identified. Defer to next venue of care to ensure apopropriate follow up and implementation of AE for feeding.    Follow Up Recommendations  SNF    Equipment Recommendations  Other (comment)(gave U cuff)    Recommendations for Other Services       Precautions / Restrictions Precautions Precautions: Fall Restrictions Weight Bearing Restrictions: No      Mobility Bed Mobility Overal bed mobility: Needs Assistance Bed Mobility: Supine to Sit;Sit to Supine     Supine to sit: Mod assist;HOB elevated Sit to supine: Supervision   General bed mobility comments: mod A for trunk support, due to lack of ability to push self with UEs  Transfers                 General transfer comment: NT- mechanical lift for transfers at baseline    Balance Overall balance assessment: Needs assistance Sitting-balance support: No  upper extremity supported;Feet unsupported Sitting balance-Leahy Scale: Fair Sitting balance - Comments: pt sat EOB while lotion was applied by nursing to BUEs, no LOB noted                                   ADL either performed or assessed with clinical judgement   ADL Overall ADL's : Needs assistance/impaired Eating/Feeding: Minimal assistance;Moderate assistance;With adaptive utensils;Sitting Eating/Feeding Details (indicate cue type and reason): recent difficulty holding utensils after wound of R RF, gave U cuff Grooming: Minimal assistance;Moderate assistance;With adaptive equipment;Sitting Grooming Details (indicate cue type and reason): recent difficulty holding utensils after wound of R RF, gave U cuff Upper Body Bathing: Total assistance;Bed level   Lower Body Bathing: Total assistance;Bed level Lower Body Bathing Details (indicate cue type and reason): baseline Upper Body Dressing : Total assistance;Sitting   Lower Body Dressing: Total assistance;Sit to/from stand   Toilet Transfer: Total assistance Toilet Transfer Details (indicate cue type and reason): either mechanical lift of bed level bed pan Toileting- Clothing Manipulation and Hygiene: Total assistance     Tub/Shower Transfer Details (indicate cue type and reason): N/a, pt bed level baths at baseline Functional mobility during ADLs: Wheelchair(NT this date, anticipate max A in w/c with new wound on functional RUE) General ADL Comments: Pt presents at baseline with ADLs. She is total A for bathing and dressing at bed level. Pt reports recent difficulty with manipulating utensils due to wound, andministered U cuff for increased independence. Pt ind  in instructing caregive how to don. Pt completes rolling ind and bed mobility at mod-max A due to lack of ability to push self with extremities. Pt uses mechanical lift at baseline     Vision Baseline Vision/History: Wears glasses Wears Glasses: Reading  only Patient Visual Report: No change from baseline       Perception     Praxis      Pertinent Vitals/Pain Pain Assessment: No/denies pain     Hand Dominance     Extremity/Trunk Assessment Upper Extremity Assessment Upper Extremity Assessment: LUE deficits/detail;RUE deficits/detail RUE Deficits / Details: felxion contracture of all intact fingers, RF PIP joint with wound LUE Deficits / Details: below wrist amputation   Lower Extremity Assessment Lower Extremity Assessment: (L BKA, R AKA)       Communication Communication Communication: No difficulties   Cognition Arousal/Alertness: Awake/alert Behavior During Therapy: WFL for tasks assessed/performed Overall Cognitive Status: No family/caregiver present to determine baseline cognitive functioning                                 General Comments: pt is alert and oriented, but has difficulty with specific memory recall of past events   General Comments  wound on R ring finger PIP joint, open    Exercises     Shoulder Instructions      Home Living Family/patient expects to be discharged to:: Skilled nursing facility                                 Additional Comments: reports being a SNF resident "forever", unable to recall amt of years      Prior Functioning/Environment Level of Independence: Needs assistance  Gait / Transfers Assistance Needed: total A with mechanical lift, uses manual w/c ADL's / Homemaking Assistance Needed: pt dependent for bathing, total for t/fs to toileting min-mod A for grooming and feeding with supply set up            OT Problem List: Decreased strength;Impaired balance (sitting and/or standing);Decreased knowledge of use of DME or AE;Impaired UE functional use      OT Treatment/Interventions: Self-care/ADL training;Therapeutic activities;DME and/or AE instruction;Therapeutic exercise;Patient/family education    OT Goals(Current goals can be found in  the care plan section) Acute Rehab OT Goals Patient Stated Goal: to get motorized w/c OT Goal Formulation: With patient Time For Goal Achievement: 03/01/18 Potential to Achieve Goals: Good  OT Frequency:     Barriers to D/C:            Co-evaluation              AM-PAC PT "6 Clicks" Daily Activity     Outcome Measure Help from another person eating meals?: A Little Help from another person taking care of personal grooming?: A Little Help from another person toileting, which includes using toliet, bedpan, or urinal?: Total Help from another person bathing (including washing, rinsing, drying)?: Total Help from another person to put on and taking off regular upper body clothing?: Total Help from another person to put on and taking off regular lower body clothing?: Total 6 Click Score: 10   End of Session    Activity Tolerance: Patient tolerated treatment well Patient left: in bed;with call bell/phone within reach;with bed alarm set;with nursing/sitter in room  OT Visit Diagnosis: Other abnormalities of gait and mobility (R26.89);Muscle weakness (generalized) (M62.81)  Time: 1610-9604 OT Time Calculation (min): 20 min Charges:  OT General Charges $OT Visit: 1 Visit OT Evaluation $OT Eval Moderate Complexity: 1 Mod OT Treatments $Self Care/Home Management : 8-22 mins  Dalphine Handing, MSOT, OTR/L Behavioral Health OT/ Acute Relief OT Office:[phone]  Dalphine Handing 02/15/2018, 5:45 PM

## 2018-02-15 NOTE — NC FL2 (Signed)
[place] MEDICAID FL2 LEVEL OF CARE SCREENING TOOL     IDENTIFICATION  Patient Name: [name] Birthdate: [birth year] Sex: female Admission Date (Current Location): 02/14/2018  [facility] and [place] Number:  Producer, television/film/video and Address:  [facility], [address], [place], [place] [address]      Provider Number: [id number]  Attending Physician Name and Address:  Jerald Kief, MD  Relative Name and Phone Number:  Pati Gallo [phone]    Current Level of Care: Hospital Recommended Level of Care: Nursing Facility(Long term resident at [facility]) Prior Approval Number:    Date Approved/Denied:   PASRR Number: [id number]  Discharge Plan: SNF    Current Diagnoses: Patient Active Problem List   Diagnosis Date Noted  . Cellulitis of finger of right hand   . Cellulitis and abscess of hand 02/14/2018  . Hypothermia   . Abnormal EKG   . Elevated troponin   . Hypoglycemia due to type 2 diabetes mellitus (HCC) 06/11/2015  . Pulmonary edema 06/11/2015  . S/P AKA (above knee amputation) unilateral (HCC) 05/19/2015  . 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 Depression 03/13/2015  . Prolonged Q-T interval on ECG 03/13/2015  . Type 2 diabetes mellitus, controlled, with renal complications (HCC) 12/12/2014  . Local infection of skin and subcutaneous tissue   . Chronic anemia 12/10/2014  . Ulcer of lower limb, unspecified 01/22/2013  . Mechanical complication of other vascular device, implant, and graft 05/01/2012  . Mental status, decreased 03/19/2012  . Seizure-like activity (HCC) 03/19/2012  . Thrombocytopenia (HCC) 01/24/2012  . Acute respiratory failure with hypoxia (HCC) 01/23/2012  . HTN (hypertension) 03/01/2011  . ESRD (end stage renal disease) on dialysis (HCC) 03/01/2011  . Obesities, morbid (HCC) 03/01/2011  . Osteomyelitis (HCC) 03/01/2011  . Secondary renal hyperparathyroidism  (HCC) 03/01/2011  . Complication of vascular access for dialysis 03/01/2011    Orientation RESPIRATION BLADDER Height & Weight     Place, Self, Situation, Time  Normal Continent Weight: 167 lb 5.3 oz (75.9 kg) Height:     BEHAVIORAL SYMPTOMS/MOOD NEUROLOGICAL BOWEL NUTRITION STATUS      Continent Diet(Renal diet; Fluid restriction 1200 mL)  AMBULATORY STATUS COMMUNICATION OF NEEDS Skin   Total Care Verbally Other (Comment), Skin abrasions(Abrasion & Ecchymosis: right and left arms ; Rash: abdomen and groin lower right and left under patient folds ; Wound on right ring finger)                       Personal Care Assistance Level of Assistance  Total care       Total Care Assistance: Maximum assistance   Functional Limitations Info  Hearing, Speech, Sight Sight Info: Impaired Hearing Info: Adequate Speech Info: Adequate    SPECIAL CARE FACTORS FREQUENCY  PT (By licensed PT)     PT Frequency: Evaluation 02/15/2018. PT signed off              Contractures Contractures Info: Present    Additional Factors Info  Code Status, Allergies, Insulin Sliding Scale Code Status Info: DNR Allergies Info: codeine, levofloxacin, penicillins   Insulin Sliding Scale Info: Insulin aspart: 0-9 units 3x daily with meals, 0-5 units daily at bedtime       Current Medications (02/15/2018):  This is the current hospital active medication list Current Facility-Administered Medications  Medication Dose Route Frequency Provider Last Rate Last Dose  . acetaminophen (TYLENOL) tablet 650 mg  650 mg Oral Q6H PRN Rolly Salter, MD       Or  . acetaminophen (TYLENOL) suppository 650 mg  650 mg Rectal Q6H PRN Rolly Salter, MD      . aspirin chewable tablet 81 mg  81 mg Oral Daily Rolly Salter, MD   81 mg at 02/15/18 1610  . atorvastatin (LIPITOR) tablet 10 mg  10 mg Oral Daily Rolly Salter, MD   10 mg at 02/15/18 9604  . [START ON 02/16/2018] Chlorhexidine Gluconate Cloth 2 % PADS  6 each  6 each Topical Q0600 Pola Corn, NP      . cinacalcet [facility]) tablet 30 mg  30 mg Oral Q supper Pola Corn, NP      . docusate sodium (COLACE) capsule 100 mg  100 mg Oral BID Rolly Salter, MD   100 mg at 02/15/18 5409  . [START ON 02/16/2018] doxercalciferol (HECTOROL) injection 6 mcg  6 mcg Intravenous Q M,W,F-HD Pola Corn, NP      . DULoxetine (CYMBALTA) DR capsule 60 mg  60 mg Oral Daily Rolly Salter, MD   60 mg at 02/15/18 8119  . gabapentin (NEURONTIN) capsule 100 mg  100 mg Oral TID Rolly Salter, MD   100 mg at 02/15/18 1611  . heparin injection 5,000 Units  5,000 Units Subcutaneous Q8H Rolly Salter, MD   5,000 Units at 02/15/18 1332  . HYDROcodone-acetaminophen (NORCO/VICODIN) 5-325 MG per tablet 1 tablet  1 tablet Oral Q6H PRN Rolly Salter, MD   1 tablet at 02/15/18 1050  . insulin aspart (novoLOG) injection 0-5 Units  0-5 Units Subcutaneous QHS Lynden Oxford M, MD      . insulin aspart (novoLOG) injection 0-9 Units  0-9 Units Subcutaneous TID WC Rolly Salter, MD      . lamoTRIgine (LAMICTAL) tablet 12.5 mg  12.5 mg Oral QHS Rolly Salter, MD   12.5 mg at 02/15/18 0041  . levothyroxine (SYNTHROID, LEVOTHROID) tablet 25 mcg  25 mcg Oral QAC breakfast Rolly Salter, MD   25 mcg at 02/15/18 0534  . mupirocin cream (BACTROBAN) 2 %   Topical BID [name], MD      . ondansetron [facility]) tablet 4 mg  4 mg Oral Q6H PRN Rolly Salter, MD       Or  . ondansetron [facility]) injection 4 mg  4 mg Intravenous Q6H PRN Rolly Salter, MD      . oxyCODONE (Oxy IR/ROXICODONE) immediate release tablet 10 mg  10 mg Oral BID Rolly Salter, MD   10 mg at 02/15/18 1478  . pantoprazole (PROTONIX) EC tablet 40 mg  40 mg Oral Daily Rolly Salter, MD   40 mg at 02/15/18 2956  . [START ON 02/16/2018] vancomycin (VANCOCIN) IVPB 750 mg/150 ml premix  750 mg Intravenous Q M,W,F-HD Koleen Nimrod, [place]         Discharge  Medications: Please see discharge summary for a list of discharge medications.  Relevant Imaging Results:  Relevant Lab Results:   Additional Information [id number]; Dialysis [facility] MWF  [employer]ana [employer] Work [phone]

## 2018-02-15 NOTE — Discharge Summary (Signed)
Physician Discharge Summary  BRIYANA BADMAN ZOX:[id number] DOB: [birth year] DOA: 02/14/2018  PCP: Terrial Rhodes, MD  Admit date: 02/14/2018 Discharge date: 02/15/2018  Admitted From: SNF Disposition:  SNF  Recommendations for Outpatient Follow-up:  1. Follow up with PCP in 1-2 weeks 2. Follow up with Dr. Lajoyce Corners as scheduled in one week  Discharge Condition:Stable CODE STATUS:DNR Diet recommendation: Diabetic   Brief/Interim Summary: 57 y.o. female with Past medical history of ESRD on HD TTS, type II DM with renal complication, chronic back pain, history of dementia, PVD S/P amputation, osteomyelitis S/P amputation, CVA, right AKA, left BKA. Patient was brought from hemodialysis.  Patient had sustained injury from a wheelchair a few weeks ago.  The wound was not healing well and therefore she was seen by wound care provider at the SNF.  They started her on some local wound dressing followed by oral doxycycline and Keflex which the patient is still supposed to be taking. When patient was at hemodialysis on 10 2319 they removed the dressing and found purulent discharge coming out from the wound with redness and streaking involving her dorsum of the hand and therefore patient was brought to the hospital for further work-up. Patient mentions that she has pain which is new at the local site.  No generalized fever nausea vomiting diarrhea abdominal pain.  No other change in her medications recently.  ED Course: Patient was given broad-spectrum IV antibiotics, x-ray was negative for ostiomyellitus, patient was referred for admission  At her baseline ambulates wheelchair And is independent for most of her ADL; manages her medication on her own  1.  Cellulitis of the hand. Was started on IV vancomycin at time of admit Orthopedics consulted.  Per Dr. Lajoyce Corners, recommendation to continue doxycycline on discharge Medically stable  2.  ESRD on HD. Completed her HD treatment on  02/14/2018. Stable  3.  Chronic pain syndrome. Reviewed medication with SNF.  Patient is on Cymbalta, OxyContin twice daily, gabapentin 3 times daily and Norco as needed. We will continue this medication. Stable at present  4.  Type 2 diabetes mellitus. Renal complication. Continue on SSI while in hospital  5.  Mood disorder. Continue Lamictal as tolerated  Discharge Diagnoses:  Active Problems:   HTN (hypertension)   ESRD (end stage renal disease) on dialysis (HCC)   Osteomyelitis (HCC)   Secondary renal hyperparathyroidism (HCC)   Chronic anemia   Type 2 diabetes mellitus, controlled, with renal complications (HCC)   Local infection of skin and subcutaneous tissue   Cellulitis of finger of right hand    Discharge Instructions   Allergies as of 02/15/2018      Reactions   Codeine Hives   Levofloxacin Other (See Comments)   Unknown, but noted on MAR   Penicillins Hives   Has patient had a PCN reaction causing immediate rash, facial/tongue/throat swelling, SOB or lightheadedness with hypotension: Yes Has patient had a PCN reaction causing severe rash involving mucus membranes or skin necrosis: Unk Has patient had a PCN reaction that required hospitalization: Unk Has patient had a PCN reaction occurring within the last 10 years: Unk If all of the above answers are "NO", then may proceed with Cephalosporin use.      Medication List    TAKE these medications   ARTIFICIAL TEARS 1-0.3 % Soln Generic drug:  Propylene Glycol-Glycerin Place 1 drop into both eyes every 12 (twelve) hours as needed (for eye iritation).   aspirin 81 MG chewable tablet Place 1 tablet (81 mg  total) into feeding tube daily. What changed:  how to take this   atorvastatin 10 MG tablet Commonly known as:  LIPITOR Take 10 mg by mouth daily.   cinacalcet 30 MG tablet Commonly known as:  SENSIPAR Take 30 mg by mouth at bedtime.   DERMACLOUD Crea Apply topically 3 (three) times daily. To  buttocks   docusate sodium 50 MG capsule Commonly known as:  COLACE Take 50 mg by mouth every evening.   doxycycline 100 MG capsule Commonly known as:  VIBRAMYCIN Take 1 capsule (100 mg total) by mouth 2 (two) times daily for 10 days.   DULoxetine 60 MG capsule Commonly known as:  CYMBALTA Take 60 mg by mouth daily.   gabapentin 100 MG capsule Commonly known as:  NEURONTIN Take 100 mg by mouth 3 (three) times daily.   GLUCAGON EMERGENCY 1 MG injection Generic drug:  glucagon Inject 1 mg into the vein once as needed (for blood sugar).   glycerin adult 2 g suppository Place 1 suppository rectally as needed for constipation.   guaiFENesin 100 MG/5ML Soln Commonly known as:  ROBITUSSIN Take 20 mLs by mouth every 8 (eight) hours as needed for cough or to loosen phlegm.   HYDROcodone-acetaminophen 5-325 MG tablet Commonly known as:  NORCO/VICODIN Take 1-2 tablets by mouth every 6 (six) hours as needed. What changed:  how much to take   hydrocortisone cream 1 % Apply 1 application topically every 8 (eight) hours as needed for itching.   insulin aspart 100 UNIT/ML injection Commonly known as:  novoLOG Inject 2-10 Units into the skin 3 (three) times daily before meals. Per sliding scale 0-59: notify doctor 60-100= 0 units 101-199 = 2 units 200-299 = 6 units 300-349 = 8 units 350-399 = 10 units 400   lamoTRIgine 25 MG tablet Commonly known as:  LAMICTAL Take 12.5 mg by mouth at bedtime.   levothyroxine 25 MCG tablet Commonly known as:  SYNTHROID, LEVOTHROID Place 1 tablet (25 mcg total) into feeding tube daily before breakfast. What changed:  how to take this   Melatonin-Pyridoxine 3-1 MG Tabs Take 1 tablet by mouth at bedtime.   mupirocin ointment 2 % Commonly known as:  BACTROBAN Apply 1 application topically 2 (two) times daily. Apply to the affected area 2 times a day   neomycin-bacitracin-polymyxin ointment Commonly known as:  NEOSPORIN Apply 1 application  topically daily.   ondansetron 4 MG tablet Commonly known as:  ZOFRAN Take 4 mg by mouth every 8 (eight) hours as needed for nausea or vomiting.   Oxycodone HCl 10 MG Tabs Take 10 mg by mouth 2 (two) times daily.   pantoprazole 40 MG tablet Commonly known as:  PROTONIX Take 40 mg by mouth daily.   polyethylene glycol packet Commonly known as:  MIRALAX / GLYCOLAX Take 17 g by mouth daily.   prednisoLONE acetate 1 % ophthalmic suspension Commonly known as:  PRED FORTE Place 1 drop into the right eye 4 (four) times daily.   PREPARATION H TOTABLES WIPES 50 % Pads Apply topically every 8 (eight) hours as needed (for hemmorrhoids).   PROBIOTIC PO Take 250 mg by mouth 2 (two) times daily.   promethazine 6.25 MG/5ML syrup Commonly known as:  PHENERGAN Take 12.5 mg by mouth every 6 (six) hours as needed for nausea or vomiting.   senna-docusate 8.6-50 MG tablet Commonly known as:  Senokot-S Take 2 tablets by mouth at bedtime.   sevelamer carbonate 800 MG tablet Commonly known as:  RENVELA  Take 400 mg by mouth 3 (three) times daily with meals.   sucralfate 1 g tablet Commonly known as:  CARAFATE Take 1 g by mouth 4 (four) times daily.   triamcinolone 0.025 % cream Commonly known as:  KENALOG Apply 1 application topically 2 (two) times daily.   Vitamin D-3 5000 units Tabs Take 5,000 Units by mouth daily.      Follow-up Information    [name], MD Follow up in 1 week(s).   Specialty:  Orthopedic Surgery Contact information: [address] [place] [address] [phone]        Terrial Rhodes, MD Follow up in 2 week(s).   Specialty:  Nephrology Contact information: [address] [place] [address] [phone]          Allergies  Allergen Reactions  . Codeine Hives  . Levofloxacin Other (See Comments)    Unknown, but noted on MAR  . Penicillins Hives    Has patient had a PCN reaction causing immediate rash,  facial/tongue/throat swelling, SOB or lightheadedness with hypotension: Yes Has patient had a PCN reaction causing severe rash involving mucus membranes or skin necrosis: Unk Has patient had a PCN reaction that required hospitalization: Unk Has patient had a PCN reaction occurring within the last 10 years: Unk If all of the above answers are "NO", then may proceed with Cephalosporin use.     Consultations:  Orthopedic Surgery  Procedures/Studies: Dg Hand Complete Right  Result Date: 02/14/2018 CLINICAL DATA:  57 year old female with a history of right hand wound EXAM: RIGHT HAND - COMPLETE 3+ VIEW COMPARISON:  None. FINDINGS: Osteopenia.  Healed amputation of the third digit. No displaced fracture identified. No erosive changes. Contractures of the PIP and DIP is a without subluxation/dislocation. Soft tissue injury is suggested on the dorsal aspect of the fourth digit on the oblique image. Degenerative changes at the first carpometacarpal joint. Diffuse calcifications of the visualized arteries. IMPRESSION: No acute bony abnormality identified. Soft tissue injury on the dorsal aspect of the fourth digit on the oblique image. Vascular calcifications. Healed amputation of the third digit. Electronically Signed   By: Gilmer Mor D.O.   On: 02/14/2018 16:53    Subjective: Eager to be discharged  Discharge Exam: Vitals:   02/15/18 0505 02/15/18 0926  BP: (!) 112/31 (!) 101/32  Pulse: 82 69  Resp: 18 18  Temp: 98.4 F (36.9 C) 98.2 F (36.8 C)  SpO2: 93% 100%   Vitals:   02/14/18 1930 02/14/18 2127 02/15/18 0505 02/15/18 0926  BP: (!) 136/47 (!) 132/49 (!) 112/31 (!) 101/32  Pulse: 80 81 82 69  Resp:  18 18 18   Temp:  98.2 F (36.8 C) 98.4 F (36.9 C) 98.2 F (36.8 C)  TempSrc:  Oral Oral Oral  SpO2: 96% 94% 93% 100%  Weight:  75.9 kg      General: Pt is alert, awake, not in acute distress Cardiovascular: RRR, S1/S2 +, no rubs, no gallops Respiratory: CTA bilaterally, no  wheezing, no rhonchi Abdominal: Soft, NT, ND, bowel sounds + Extremities: no edema, no cyanosis   The results of significant diagnostics from this hospitalization (including imaging, microbiology, ancillary and laboratory) are listed below for reference.     Microbiology: Recent Results (from the past 240 hour(s))  Blood culture (routine x 2)     Status: None (Preliminary result)   Collection Time: 02/14/18  4:26 PM  Result Value Ref Range Status   Specimen Description BLOOD RIGHT HAND  Final  Special Requests   Final    BOTTLES DRAWN AEROBIC AND ANAEROBIC Blood Culture results may not be optimal due to an inadequate volume of blood received in culture bottles   Culture   Final    NO GROWTH < 24 HOURS Performed at [facility], [address], [place], [place] [address]    Report Status PENDING  Incomplete  Blood culture (routine x 2)     Status: None (Preliminary result)   Collection Time: 02/14/18  4:27 PM  Result Value Ref Range Status   Specimen Description BLOOD RIGHT ANTECUBITAL  Final   Special Requests   Final    BOTTLES DRAWN AEROBIC AND ANAEROBIC Blood Culture adequate volume   Culture   Final    NO GROWTH < 24 HOURS Performed at [facility], [address] [place], [place] [address]    Report Status PENDING  Incomplete  MRSA PCR Screening     Status: None   Collection Time: 02/15/18 12:54 AM  Result Value Ref Range Status   MRSA by PCR NEGATIVE NEGATIVE Final    Comment:        The GeneXpert MRSA Assay (FDA approved for NASAL specimens only), is one component of a comprehensive MRSA colonization surveillance program. It is not intended to diagnose MRSA infection nor to guide or monitor treatment for MRSA infections. Performed at [facility], [address] [place], [place] [address]      Labs: BNP (last 3 results) No results for input(s): BNP in the last 8760 hours. Basic Metabolic Panel: Recent Labs  Lab 02/14/18 1631  02/15/18 0419  NA 138 138  K 3.3* 3.5  CL 94* 96*  CO2 29 30  GLUCOSE 114* 84  BUN 11 16  CREATININE 3.20* 4.18*  CALCIUM 9.7 9.5   Liver Function Tests: Recent Labs  Lab 02/14/18 1631 02/15/18 0419  AST 18 15  ALT 12 10  ALKPHOS 168* 141*  BILITOT 1.1 1.2  PROT 8.1 7.0  ALBUMIN 4.1 3.6   No results for input(s): LIPASE, AMYLASE in the last 168 hours. No results for input(s): AMMONIA in the last 168 hours. CBC: Recent Labs  Lab 02/14/18 1631 02/15/18 0419  WBC 4.2 3.2*  NEUTROABS 2.9  --   HGB 13.5 11.8*  HCT 42.7 38.5  MCV 94.7 94.8  PLT 97* 86*   Cardiac Enzymes: No results for input(s): CKTOTAL, CKMB, CKMBINDEX, TROPONINI in the last 168 hours. BNP: Invalid input(s): POCBNP CBG: Recent Labs  Lab 02/14/18 2125 02/15/18 0831 02/15/18 1125  GLUCAP 98 77 99   D-Dimer No results for input(s): DDIMER in the last 72 hours. Hgb A1c Recent Labs    02/15/18 0419  HGBA1C 5.8*   Lipid Profile No results for input(s): CHOL, HDL, LDLCALC, TRIG, CHOLHDL, LDLDIRECT in the last 72 hours. Thyroid function studies No results for input(s): TSH, T4TOTAL, T3FREE, THYROIDAB in the last 72 hours.  Invalid input(s): FREET3 Anemia work up No results for input(s): VITAMINB12, FOLATE, FERRITIN, TIBC, IRON, RETICCTPCT in the last 72 hours. Urinalysis    Component Value Date/Time   COLORURINE YELLOW 07/26/2017 1850   APPEARANCEUR TURBID (A) 07/26/2017 1850   APPEARANCEUR Turbid 12/15/2011 0928   LABSPEC 1.015 07/26/2017 1850   LABSPEC 1.014 12/15/2011 0928   PHURINE 8.0 07/26/2017 1850   GLUCOSEU 150 (A) 07/26/2017 1850   GLUCOSEU 150 mg/dL 91/47/8295 6213   HGBUR SMALL (A) 07/26/2017 1850   BILIRUBINUR NEGATIVE 07/26/2017 1850  BILIRUBINUR Negative 12/15/2011 0928   KETONESUR NEGATIVE 07/26/2017 1850   PROTEINUR 100 (A) 07/26/2017 1850   UROBILINOGEN 0.2 03/19/2012 1714   NITRITE NEGATIVE 07/26/2017 1850   LEUKOCYTESUR MODERATE (A) 07/26/2017 1850    LEUKOCYTESUR 3+ 12/15/2011 0928   Sepsis Labs Invalid input(s): PROCALCITONIN,  WBC,  LACTICIDVEN Microbiology Recent Results (from the past 240 hour(s))  Blood culture (routine x 2)     Status: None (Preliminary result)   Collection Time: 02/14/18  4:26 PM  Result Value Ref Range Status   Specimen Description BLOOD RIGHT HAND  Final   Special Requests   Final    BOTTLES DRAWN AEROBIC AND ANAEROBIC Blood Culture results may not be optimal due to an inadequate volume of blood received in culture bottles   Culture   Final    NO GROWTH < 24 HOURS Performed at [facility], [address], [place], [place] [address]    Report Status PENDING  Incomplete  Blood culture (routine x 2)     Status: None (Preliminary result)   Collection Time: 02/14/18  4:27 PM  Result Value Ref Range Status   Specimen Description BLOOD RIGHT ANTECUBITAL  Final   Special Requests   Final    BOTTLES DRAWN AEROBIC AND ANAEROBIC Blood Culture adequate volume   Culture   Final    NO GROWTH < 24 HOURS Performed at [facility], [address] [place], [place] [address]    Report Status PENDING  Incomplete  MRSA PCR Screening     Status: None   Collection Time: 02/15/18 12:54 AM  Result Value Ref Range Status   MRSA by PCR NEGATIVE NEGATIVE Final    Comment:        The GeneXpert MRSA Assay (FDA approved for NASAL specimens only), is one component of a comprehensive MRSA colonization surveillance program. It is not intended to diagnose MRSA infection nor to guide or monitor treatment for MRSA infections. Performed at [facility], [address] [place], [place] [address]    Time spent: 30 min  SIGNED:   Rickey Barbara, MD  [facility] 02/15/2018, 4:25 PM  If 7PM-7AM, please contact night-coverage

## 2018-02-15 NOTE — Progress Notes (Signed)
Report called and given to [employer], Charity fundraiser at [employer].

## 2018-02-15 NOTE — Consult Note (Signed)
WOC Nurse wound consult note Right third finger wound.  Review of record indicates patient is being treated by Dr. Lajoyce Corners.  WOC team will discontinue the consult.  Please follow instructions provided by Dr. Lajoyce Corners.  Helmut Muster, RN, MSN, CWOCN, CNS-BC, pager [phone]

## 2018-02-15 NOTE — Clinical Social Work Note (Addendum)
Clinical Social Work Assessment  Patient Details  Name: [name] MRN: [id number] Date of Birth: 04/16/1961  Date of referral:  02/15/18               Reason for consult:  Discharge Planning                Permission sought to share information with:  Family Supports Permission granted to share information::  No(Patient not oriented to situattion)  Name::        Agency::     Relationship::     Contact Information:     Housing/Transportation Living arrangements for the past 2 months:  Skilled Nursing Facility(From [facility]) Source of Information:  Facility, Other (Comment Required)(Hospital Liasion and sister-in-law) Patient Interpreter Needed:  None Criminal Activity/Legal Involvement Pertinent to Current Situation/Hospitalization:  No - Comment as needed Significant Relationships:  Other Family Members(Sister-in-law ) Lives with:  Facility Resident(From [facility]) Do you feel safe going back to the place where you live?  Yes Need for family participation in patient care:  Yes (Comment)  Care giving concerns:  Sister-in-law [name] expressed no concerns regarding patient's care at [facility].  Social Worker assessment / plan:  CSW talked with [name] by phone and confirmed that patient will return to [facility] once discharge. [name] advised CSW that patient came to hospital from the dialysis center. CSW visited with patient and she also confirmed return to [facility] at discharge.    Employment status:  Disabled (Comment on whether or not currently receiving Disability) Insurance information:  Teacher, English as a foreign language, Medicaid In [place] PT Recommendations:  No Follow Up Information / Referral to community resources:  Other (Comment Required)(None needed or requested as patient LTC resident at [facility] and will return there at discharge)  Patient/Family's Response to care: [name] expressed did not express any concerns regarding patient's care during hospitalization. Patient  talked with CSW regarding her concerns with the care at facility. Per patient the staff complains when they have to change her.  Patient/Family's Understanding of and Emotional Response to Diagnosis, Current Treatment, and Prognosis:  [name] expressed concern regarding patient's medical progress since being admitted. Patient did not express any concerns when advised that she is ready for discharge.  Emotional Assessment Appearance:  Other (Comment Required(Did not visit with patient, talked with sister-in-law by phone) Attitude/Demeanor/Rapport:  Unable to Assess Affect (typically observed):  Unable to Assess Orientation:  Oriented to Self, Oriented to Place, Oriented to  Time Alcohol / Substance use:  Never Used Psych involvement (Current and /or in the community):  No (Comment)  Discharge Needs  Concerns to be addressed:  Discharge Planning Concerns Readmission within the last 30 days:  No Current discharge risk:  None Barriers to Discharge:  Continued Medical Work up   [employer] Lazaro Arms, LCSW 02/15/2018, 3:51 PM

## 2018-02-19 LAB — CULTURE, BLOOD (ROUTINE X 2)
CULTURE: NO GROWTH
Culture: NO GROWTH
Special Requests: ADEQUATE

## 2018-02-20 ENCOUNTER — Ambulatory Visit (INDEPENDENT_AMBULATORY_CARE_PROVIDER_SITE_OTHER): Payer: Medicare Other | Admitting: Orthopedic Surgery

## 2018-02-26 ENCOUNTER — Ambulatory Visit (INDEPENDENT_AMBULATORY_CARE_PROVIDER_SITE_OTHER): Payer: Medicare Other | Admitting: Orthopedic Surgery

## 2018-02-26 ENCOUNTER — Encounter (INDEPENDENT_AMBULATORY_CARE_PROVIDER_SITE_OTHER): Payer: Self-pay | Admitting: Orthopedic Surgery

## 2018-02-26 VITALS — Ht 63.0 in | Wt 167.0 lb

## 2018-02-26 DIAGNOSIS — I96 Gangrene, not elsewhere classified: Secondary | ICD-10-CM

## 2018-02-26 DIAGNOSIS — M24541 Contracture, right hand: Secondary | ICD-10-CM | POA: Diagnosis not present

## 2018-02-26 NOTE — Progress Notes (Signed)
Office Visit Note   Patient: [name]           Date of Birth: [birth year]           MRN: [id number] Visit Date: 02/26/2018              Requested by: Terrial Rhodes, MD [address] [place], [place] [address] PCP: Terrial Rhodes, MD  Chief Complaint  Patient presents with  . Right Hand - Follow-up    F/u ER consult right ring finger       HPI: Patient is a 57 year old woman with severe peripheral vascular disease status post bilateral lower extremity amputation status post finger amputations who presents for follow-up for gangrenous right ring finger with flexion contracture of the finger fixed into her palm.  Patient complains of pain at all times.  She denies drainage she is not on antibiotics at this time.  Assessment & Plan: Visit Diagnoses:  1. 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 Contracture of finger joint, right     Plan: Discussed options with continued wound care with Bactroban dressing changes versus surgical intervention.  Discussed that her best surgical option would be to amputate the ring finger through the proximal phalanx consistent with the amputation of the long finger.  Patient states she wants to proceed with surgical intervention we will set this up as outpatient surgery she may return to skilled nursing after surgery.  Follow-Up Instructions: Return in about 1 week (around 03/05/2018).   Ortho Exam  Patient is alert, oriented, no adenopathy, well-dressed, normal affect, normal respiratory effort. Examination patient has multiple black gangrenous ulcers on the right ring finger there is cellulitis but there is swelling no drainage.  The finger has a fixed flexion contracture and the finger is fixed into her palm.  Patient does have tenderness to palpation consistent with the ischemic changes.  Imaging: No results found.    Labs: Lab Results  Component Value Date   HGBA1C 5.8 (H) 02/15/2018   HGBA1C <4.2 (L) 08/24/2016   HGBA1C  6.0 (H) 06/11/2015   ESRSEDRATE 130 (H) 12/12/2014   ESRSEDRATE 73 (H) 12/15/2011   ESRSEDRATE 58 (H) 03/15/2011   CRP 17.9 (H) 12/12/2014   CRP 5.29 (H) 03/15/2011   REPTSTATUS 02/19/2018 FINAL 02/14/2018   GRAMSTAIN  01/24/2012    FEW WBC PRESENT,BOTH PMN AND MONONUCLEAR NO SQUAMOUS EPITHELIAL CELLS SEEN NO ORGANISMS SEEN   CULT  02/14/2018    NO GROWTH 5 DAYS Performed at [facility], [address], [place], [place] [address]    LABORGA PSEUDOMONAS AERUGINOSA 08/24/2016     Lab Results  Component Value Date   ALBUMIN 3.6 02/15/2018   ALBUMIN 4.1 02/14/2018   ALBUMIN 3.8 07/26/2017    Body mass index is 29.58 kg/m.  Orders:  No orders of the defined types were placed in this encounter.  No orders of the defined types were placed in this encounter.    Procedures: No procedures performed  Clinical Data: No additional findings.  ROS:  All other systems negative, except as noted in the HPI. Review of Systems  Objective: Vital Signs: Ht 5\' 3"  (1.6 m)   Wt 167 lb (75.8 kg)   BMI 29.58 kg/m   Specialty Comments:  No specialty comments available.  PMFS History: Patient Active Problem List   Diagnosis Date Noted  . Cellulitis of finger of right hand   . Cellulitis and abscess of hand 02/14/2018  . Hypothermia   .  Abnormal EKG   . Elevated troponin   . Hypoglycemia due to type 2 diabetes mellitus (HCC) 06/11/2015  . Pulmonary edema 06/11/2015  . S/P AKA (above knee amputation) unilateral (HCC) 05/19/2015  . 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 Depression 03/13/2015  . Prolonged Q-T interval on ECG 03/13/2015  . Type 2 diabetes mellitus, controlled, with renal complications (HCC) 12/12/2014  . Local infection of skin and subcutaneous tissue   . Chronic anemia 12/10/2014  . Ulcer of lower limb, unspecified 01/22/2013  . Mechanical complication of other vascular device, implant, and graft 05/01/2012  . Mental  status, decreased 03/19/2012  . Seizure-like activity (HCC) 03/19/2012  . Thrombocytopenia (HCC) 01/24/2012  . Acute respiratory failure with hypoxia (HCC) 01/23/2012  . HTN (hypertension) 03/01/2011  . ESRD (end stage renal disease) on dialysis (HCC) 03/01/2011  . Obesities, morbid (HCC) 03/01/2011  . Osteomyelitis (HCC) 03/01/2011  . 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 Complication of vascular access for dialysis 03/01/2011   Past Medical History:  Diagnosis Date  . Anemia   . Blood transfusion   . Chronic back pain   . Dementia (HCC)   . Diabetes mellitus   . Hemodialysis patient (HCC) 03/15/11   "Tues; Thurs; Sat; [facility]"  . Hypertension   . Infection 02/15/2018   right hand 4th digit   . PEA (Pulseless electrical activity) (HCC)   . Peripheral vascular disease (HCC)   . Rash 03/15/11   "admitted me to find out what its' from; UE, waist, thighs, groin"  . Renal failure    hd 4/12  . Stroke [facility])     Family History  Problem Relation Age of Onset  . Diabetes Mellitus II Mother   . Stroke Mother     Past Surgical History:  Procedure Laterality Date  . AMPUTATION  03/04/2011   Procedure: AMPUTATION DIGIT;  Surgeon: [name], MD;  Location: [facility] OR;  Service: Orthopedics;  Laterality: Right;  RT GREAT TOE AMP  . AMPUTATION Right 03/13/2015   Procedure: RIGHT  ABOVE KNEE AMPUTATION;  Surgeon: Pryor Ochoa, MD;  Location: [facility] OR;  Service: Vascular;  Laterality: Right;  . ARTERIOVENOUS GRAFT PLACEMENT  10/01/10   left upper arm  . BELOW KNEE LEG AMPUTATION  2004   left  . CARPAL TUNNEL RELEASE  ~ 2009   left wrist  . CATARACT EXTRACTION W/ INTRAOCULAR LENS IMPLANT  12/2009   right eye  . HAND AMPUTATION Left   . INSERTION OF DIALYSIS CATHETER  07/29/10   right subclavian  . IR FLUORO RM 30-60 MIN  08/16/2016  . IR GASTROSTOMY TUBE MOD SED  08/23/2016  . IR GASTROSTOMY TUBE REMOVAL  02/07/2017  . TEE WITHOUT CARDIOVERSION   03/18/2011   Procedure: TRANSESOPHAGEAL ECHOCARDIOGRAM (TEE);  Surgeon: [name], MD;  Location: [facility] ENDOSCOPY;  Service: Cardiovascular;  Laterality: N/A;   Social History   Occupational History  . Not on file  Tobacco Use  . Smoking status: Never Smoker  . Smokeless tobacco: Never Used  Substance and Sexual Activity  . Alcohol use: No  . Drug use: No  . Sexual activity: Never    Birth control/protection: Post-menopausal

## 2018-03-02 ENCOUNTER — Telehealth (INDEPENDENT_AMBULATORY_CARE_PROVIDER_SITE_OTHER): Payer: Self-pay | Admitting: Orthopedic Surgery

## 2018-03-02 NOTE — Telephone Encounter (Signed)
Steward Drone, patient's sister-in-law, called to advise [place] that [name] passed away.  She asked that we cancel her surgery and upcoming appointments.

## 2018-03-08 ENCOUNTER — Ambulatory Visit (INDEPENDENT_AMBULATORY_CARE_PROVIDER_SITE_OTHER): Payer: Medicare Other | Admitting: Orthopedic Surgery

## 2018-03-25 DEATH — deceased

## 2018-04-10 ENCOUNTER — Encounter (INDEPENDENT_AMBULATORY_CARE_PROVIDER_SITE_OTHER): Payer: Medicare Other | Admitting: Ophthalmology

## 2019-01-14 IMAGING — CT CT CERVICAL SPINE W/O CM
5 of 8 series · 13 of 33 positions shown, 14 images · non-contrast
Comparison: 03/19/2012

CLINICAL DATA: Falling backward on abuts. Trauma to the head and
neck.

EXAM:
CT HEAD WITHOUT CONTRAST
CT CERVICAL SPINE WITHOUT CONTRAST
TECHNIQUE: Multidetector CT imaging of the head and cervical spine was
performed following the standard protocol without intravenous
contrast. Multiplanar CT image reconstructions of the cervical spine
were also generated.

[Series 4: head bone · axial · 0.43mm/px · z∈[-69,-17]mm · 2 of 78 slices shown]
[im 26/78  bone]
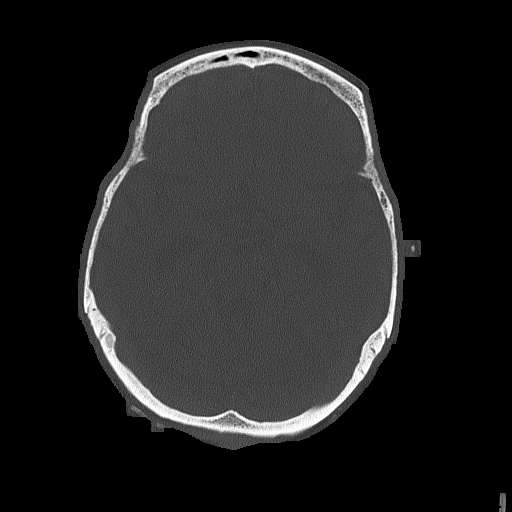
[im 52/78  bone]
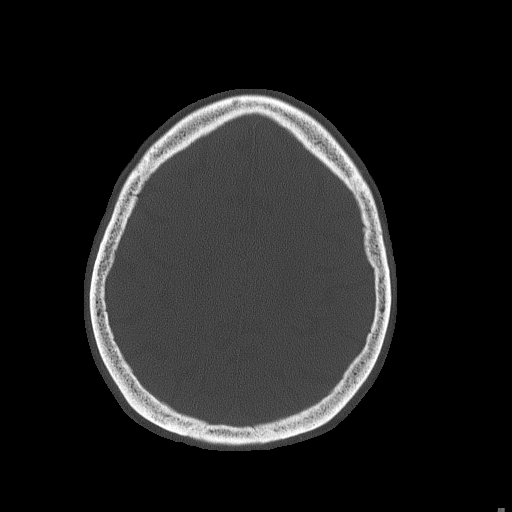

[Series 5: head without cor · coronal · non-contrast · 0.31mm/px · 3 of 64 slices shown]
[im 16/64  bone]
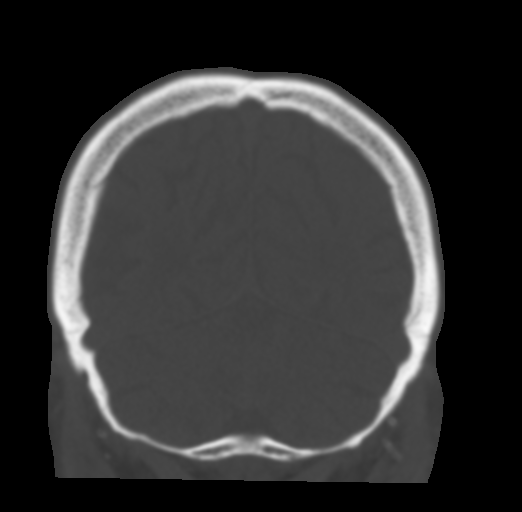
[im 32/64  bone]
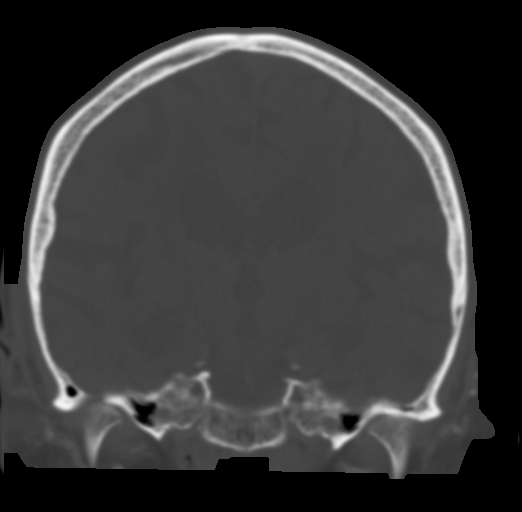
[im 48/64  bone]
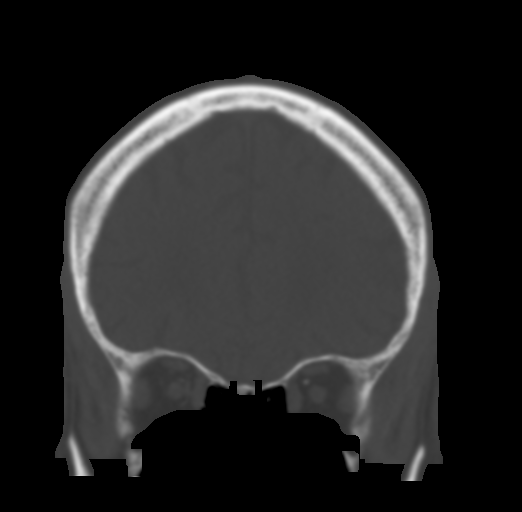

[Series 7: c_spine 2.0 st · axial · 0.27mm/px · z∈[-200,-144]mm · 2 of 84 slices shown, 3 images]
[im 28/84  soft-tissue]
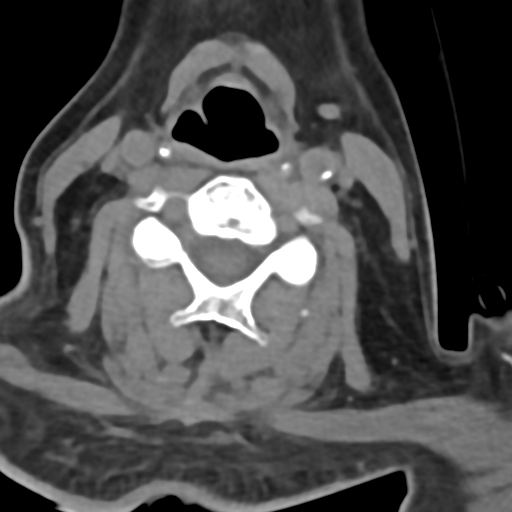
[im 28/84  bone]
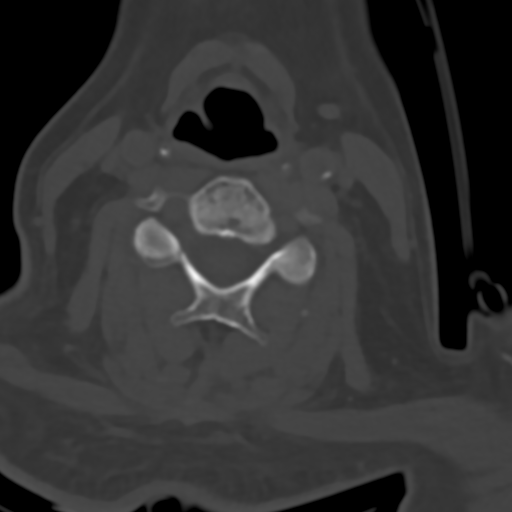
[im 56/84  bone]
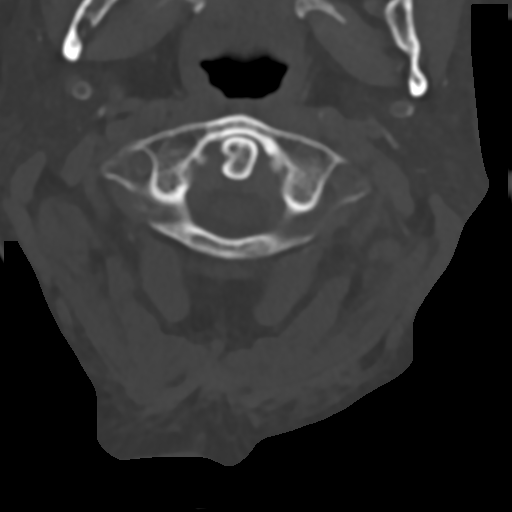

[Series 9: c_spine 2.0 sag bone · sagittal · 0.27mm/px · 4 of 61 slices shown]
[im 13/61  bone]
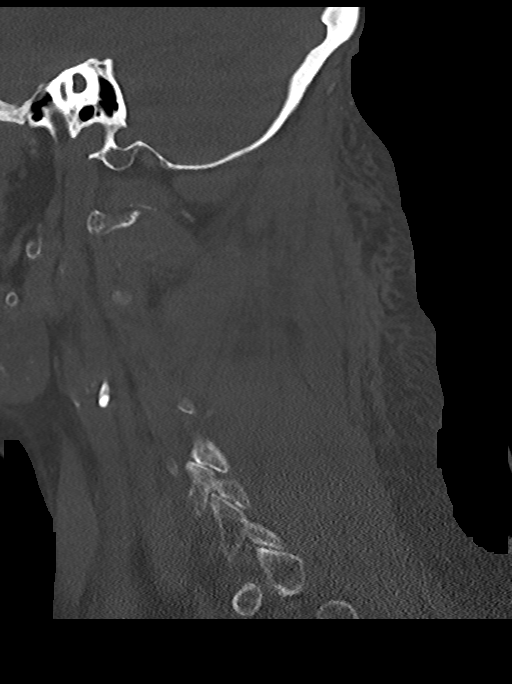
[im 25/61  bone]
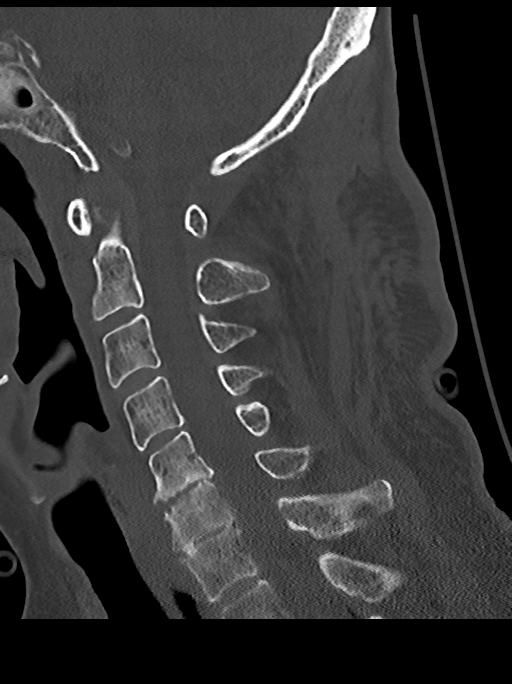
[im 37/61  bone]
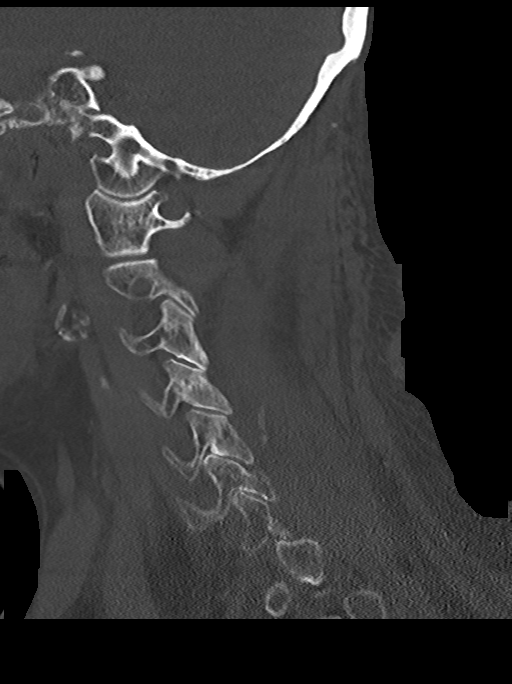
[im 49/61  bone]
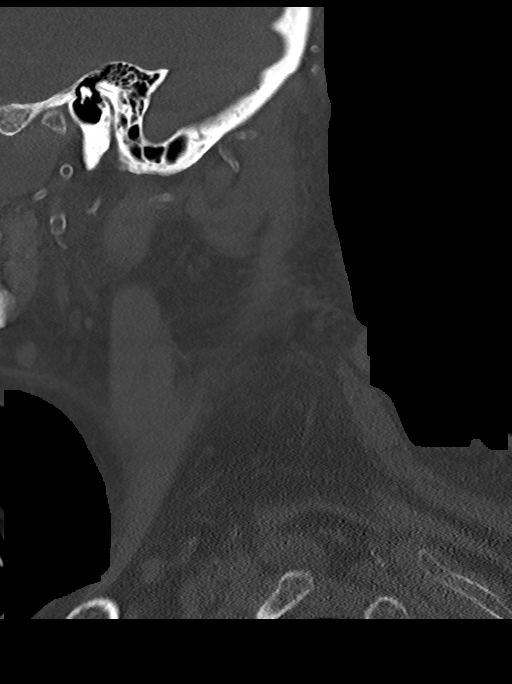

[Series 11: c_spine 2.0 orthogonals · axial · 0.21mm/px · z∈[-227,-176]mm · 2 of 73 slices shown]
[im 25/73  bone]
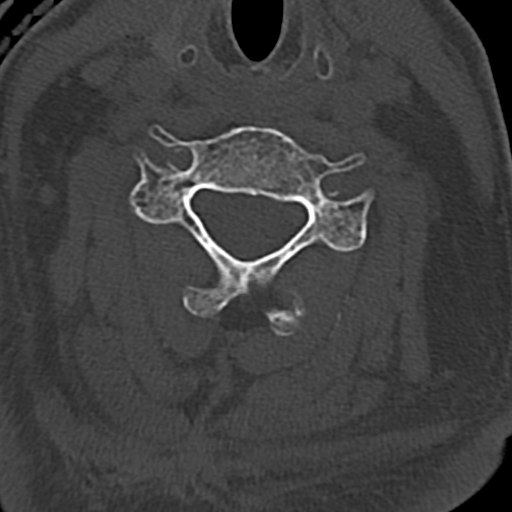
[im 49/73  bone]
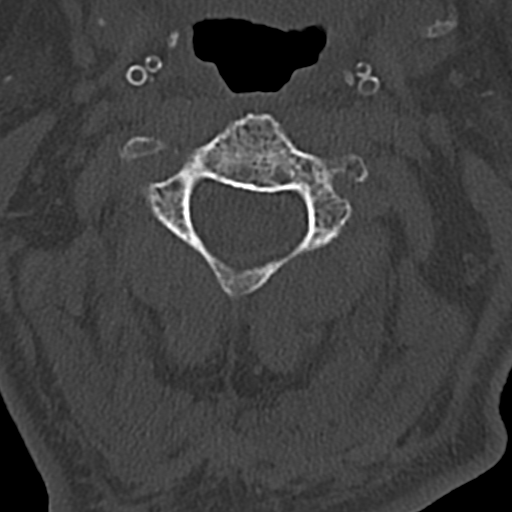

[13 of 33 positions shown; findings below may reference images not displayed]

FINDINGS: CT HEAD FINDINGS

Brain: Generalized brain atrophy, markedly advanced for age.
Extensive chronic small-vessel ischemic changes throughout the
brain. No sign of acute infarction, mass lesion, hemorrhage,
hydrocephalus or extra-axial collection.

Vascular: There is atherosclerotic calcification of the major
vessels at the base of the brain.

Skull: No traumatic finding.

Sinuses/Orbits: Clear/normal

Other: Vertex scalp hematoma

CT CERVICAL SPINE FINDINGS

Alignment: Normal

Skull base and vertebrae: Negative for fracture

Soft tissues and spinal canal: No evidence of acute soft tissue
injury.

Disc levels: Degenerative spondylosis most pronounced at C5-6 and
C6-7.

Upper chest: Not included

Other: None significant except for vascular calcification.
IMPRESSION: Vertex scalp hematoma. No skull fracture. No traumatic intracranial
finding.

Advanced atrophy and chronic small-vessel ischemic changes, markedly
pronounced for age. Extensive vascular calcification.

Cervical spondylosis C5-6 and C6-7 without traumatic finding.
